# Patient Record
Sex: Female | Born: 1955 | Race: White | Hispanic: No | Marital: Married | Smoking: Never smoker
Health system: Southern US, Community
[De-identification: ages and names within clinical notes are randomized; demographics above are authoritative.]

## PROBLEM LIST (undated history)

## (undated) DIAGNOSIS — H269 Unspecified cataract: Secondary | ICD-10-CM

## (undated) DIAGNOSIS — G709 Myoneural disorder, unspecified: Secondary | ICD-10-CM

## (undated) DIAGNOSIS — F32A Depression, unspecified: Secondary | ICD-10-CM

## (undated) DIAGNOSIS — K219 Gastro-esophageal reflux disease without esophagitis: Secondary | ICD-10-CM

## (undated) DIAGNOSIS — IMO0002 Reserved for concepts with insufficient information to code with codable children: Secondary | ICD-10-CM

## (undated) DIAGNOSIS — E785 Hyperlipidemia, unspecified: Secondary | ICD-10-CM

## (undated) DIAGNOSIS — J45909 Unspecified asthma, uncomplicated: Secondary | ICD-10-CM

## (undated) DIAGNOSIS — T7840XA Allergy, unspecified, initial encounter: Secondary | ICD-10-CM

## (undated) DIAGNOSIS — K589 Irritable bowel syndrome without diarrhea: Secondary | ICD-10-CM

## (undated) DIAGNOSIS — F329 Major depressive disorder, single episode, unspecified: Secondary | ICD-10-CM

## (undated) DIAGNOSIS — F419 Anxiety disorder, unspecified: Secondary | ICD-10-CM

## (undated) DIAGNOSIS — Z5189 Encounter for other specified aftercare: Secondary | ICD-10-CM

## (undated) DIAGNOSIS — K259 Gastric ulcer, unspecified as acute or chronic, without hemorrhage or perforation: Secondary | ICD-10-CM

## (undated) DIAGNOSIS — I10 Essential (primary) hypertension: Secondary | ICD-10-CM

## (undated) DIAGNOSIS — D649 Anemia, unspecified: Secondary | ICD-10-CM

## (undated) HISTORY — PX: DIAGNOSTIC LAPAROSCOPY: SUR761

## (undated) HISTORY — PX: OOPHORECTOMY: SHX86

## (undated) HISTORY — PX: EYE SURGERY: SHX253

## (undated) HISTORY — DX: Allergy, unspecified, initial encounter: T78.40XA

## (undated) HISTORY — PX: EXPLORATORY LAPAROTOMY: SUR591

## (undated) HISTORY — DX: Reserved for concepts with insufficient information to code with codable children: IMO0002

## (undated) HISTORY — DX: Myoneural disorder, unspecified: G70.9

## (undated) HISTORY — DX: Anemia, unspecified: D64.9

## (undated) HISTORY — PX: COLONOSCOPY: SHX174

## (undated) HISTORY — PX: FOOT SURGERY: SHX648

## (undated) HISTORY — PX: ABDOMINAL HYSTERECTOMY: SHX81

## (undated) HISTORY — PX: TONSILLECTOMY: SUR1361

## (undated) HISTORY — DX: Unspecified cataract: H26.9

## (undated) HISTORY — DX: Encounter for other specified aftercare: Z51.89

---

## 1997-04-01 ENCOUNTER — Ambulatory Visit (HOSPITAL_COMMUNITY): Admission: RE | Admit: 1997-04-01 | Discharge: 1997-04-01 | Payer: Self-pay | Admitting: Obstetrics and Gynecology

## 1998-04-26 ENCOUNTER — Other Ambulatory Visit: Admission: RE | Admit: 1998-04-26 | Discharge: 1998-04-26 | Payer: Self-pay | Admitting: Obstetrics and Gynecology

## 1999-03-07 ENCOUNTER — Other Ambulatory Visit: Admission: RE | Admit: 1999-03-07 | Discharge: 1999-03-07 | Payer: Self-pay | Admitting: Obstetrics and Gynecology

## 2000-03-05 ENCOUNTER — Other Ambulatory Visit: Admission: RE | Admit: 2000-03-05 | Discharge: 2000-03-05 | Payer: Self-pay | Admitting: Obstetrics and Gynecology

## 2000-06-10 ENCOUNTER — Encounter: Payer: Self-pay | Admitting: Obstetrics and Gynecology

## 2000-06-10 ENCOUNTER — Ambulatory Visit (HOSPITAL_COMMUNITY): Admission: RE | Admit: 2000-06-10 | Discharge: 2000-06-10 | Payer: Self-pay | Admitting: Obstetrics and Gynecology

## 2000-06-17 ENCOUNTER — Encounter: Payer: Self-pay | Admitting: Obstetrics and Gynecology

## 2000-06-17 ENCOUNTER — Encounter: Admission: RE | Admit: 2000-06-17 | Discharge: 2000-06-17 | Payer: Self-pay | Admitting: Obstetrics and Gynecology

## 2001-03-05 ENCOUNTER — Other Ambulatory Visit: Admission: RE | Admit: 2001-03-05 | Discharge: 2001-03-05 | Payer: Self-pay | Admitting: Obstetrics and Gynecology

## 2002-03-10 ENCOUNTER — Other Ambulatory Visit: Admission: RE | Admit: 2002-03-10 | Discharge: 2002-03-10 | Payer: Self-pay | Admitting: Obstetrics and Gynecology

## 2003-05-11 ENCOUNTER — Other Ambulatory Visit: Admission: RE | Admit: 2003-05-11 | Discharge: 2003-05-11 | Payer: Self-pay | Admitting: Obstetrics and Gynecology

## 2004-05-17 ENCOUNTER — Other Ambulatory Visit: Admission: RE | Admit: 2004-05-17 | Discharge: 2004-05-17 | Payer: Self-pay | Admitting: Obstetrics and Gynecology

## 2004-06-01 ENCOUNTER — Ambulatory Visit: Payer: Self-pay | Admitting: Family Medicine

## 2004-11-08 ENCOUNTER — Ambulatory Visit: Payer: Self-pay | Admitting: Family Medicine

## 2004-11-16 ENCOUNTER — Ambulatory Visit: Payer: Self-pay | Admitting: Family Medicine

## 2005-02-22 ENCOUNTER — Ambulatory Visit: Payer: Self-pay | Admitting: Family Medicine

## 2005-03-23 ENCOUNTER — Ambulatory Visit: Payer: Self-pay | Admitting: Family Medicine

## 2005-05-17 ENCOUNTER — Ambulatory Visit: Payer: Self-pay | Admitting: Family Medicine

## 2006-05-09 ENCOUNTER — Ambulatory Visit: Payer: Self-pay | Admitting: Obstetrics and Gynecology

## 2007-05-13 ENCOUNTER — Ambulatory Visit: Payer: Self-pay | Admitting: Internal Medicine

## 2008-05-27 ENCOUNTER — Ambulatory Visit: Payer: Self-pay | Admitting: Obstetrics and Gynecology

## 2009-06-07 ENCOUNTER — Ambulatory Visit: Payer: Self-pay | Admitting: Obstetrics and Gynecology

## 2010-07-04 ENCOUNTER — Ambulatory Visit: Payer: Self-pay

## 2011-07-05 ENCOUNTER — Ambulatory Visit: Payer: Self-pay

## 2011-08-10 ENCOUNTER — Inpatient Hospital Stay: Payer: Self-pay | Admitting: Internal Medicine

## 2011-08-10 LAB — COMPREHENSIVE METABOLIC PANEL
Albumin: 4 g/dL (ref 3.4–5.0)
Albumin: 4.2 g/dL (ref 3.4–5.0)
Alkaline Phosphatase: 92 U/L (ref 50–136)
Alkaline Phosphatase: 88 U/L (ref 50–136)
Anion Gap: 11 (ref 7–16)
Anion Gap: 10 (ref 7–16)
BUN: 9 mg/dL (ref 7–18)
BUN: 8 mg/dL (ref 7–18)
Bilirubin,Total: 0.5 mg/dL (ref 0.2–1.0)
Bilirubin,Total: 0.6 mg/dL (ref 0.2–1.0)
Calcium, Total: 9.1 mg/dL (ref 8.5–10.1)
Calcium, Total: 9.3 mg/dL (ref 8.5–10.1)
Chloride: 73 mmol/L — ABNORMAL LOW (ref 98–107)
Chloride: 72 mmol/L — ABNORMAL LOW (ref 98–107)
Co2: 32 mmol/L (ref 21–32)
Co2: 33 mmol/L — ABNORMAL HIGH (ref 21–32)
Creatinine: 0.67 mg/dL (ref 0.60–1.30)
Creatinine: 0.6 mg/dL (ref 0.60–1.30)
EGFR (African American): >60
EGFR (African American): >60
EGFR (Non-African Amer.): >60
EGFR (Non-African Amer.): >60
Glucose: 130 mg/dL — ABNORMAL HIGH (ref 65–99)
Glucose: 128 mg/dL — ABNORMAL HIGH (ref 65–99)
Osmolality: 235 (ref 275–301)
Osmolality: 233 (ref 275–301)
Potassium: 1.9 mmol/L — CL (ref 3.5–5.1)
Potassium: 1.8 mmol/L — CL (ref 3.5–5.1)
SGOT(AST): 44 U/L — ABNORMAL HIGH (ref 15–37)
SGOT(AST): 51 U/L — ABNORMAL HIGH (ref 15–37)
SGPT (ALT): 40 U/L
SGPT (ALT): 42 U/L
Sodium: 116 mmol/L — CL (ref 136–145)
Sodium: 115 mmol/L — CL (ref 136–145)
Total Protein: 7.3 g/dL (ref 6.4–8.2)
Total Protein: 7.4 g/dL (ref 6.4–8.2)

## 2011-08-10 LAB — CBC
HCT: 36.8 % (ref 35.0–47.0)
HGB: 13.4 g/dL (ref 12.0–16.0)
MCH: 31.5 pg (ref 26.0–34.0)
MCHC: 36.5 g/dL — ABNORMAL HIGH (ref 32.0–36.0)
MCV: 86 fL (ref 80–100)
Platelet: 303 10*3/uL (ref 150–440)
RBC: 4.25 10*6/uL (ref 3.80–5.20)
RDW: 12 % (ref 11.5–14.5)
WBC: 5.7 10*3/uL (ref 3.6–11.0)

## 2011-08-10 LAB — URINALYSIS, COMPLETE
Bacteria: NONE SEEN
Bilirubin,UR: NEGATIVE
Blood: NEGATIVE
Glucose,UR: NEGATIVE mg/dL (ref 0–75)
Ketone: NEGATIVE
Leukocyte Esterase: NEGATIVE
Nitrite: NEGATIVE
Ph: 9 (ref 4.5–8.0)
Protein: NEGATIVE
RBC,UR: <1 /HPF (ref 0–5)
Specific Gravity: 1.002 (ref 1.003–1.030)
Squamous Epithelial: NONE SEEN
WBC UR: NONE SEEN /HPF (ref 0–5)

## 2011-08-10 LAB — PHOSPHORUS: Phosphorus: 1.6 mg/dL — ABNORMAL LOW (ref 2.5–4.9)

## 2011-08-10 LAB — MAGNESIUM: Magnesium: 1.8 mg/dL

## 2011-08-10 LAB — SODIUM, URINE, RANDOM: Sodium, Urine Random: 47 mmol/L (ref 20–110)

## 2011-08-10 LAB — TSH: Thyroid Stimulating Horm: 3.9 u[IU]/mL

## 2011-08-11 LAB — CBC WITH DIFFERENTIAL/PLATELET
Basophil #: 0 10*3/uL (ref 0.0–0.1)
Basophil %: 0.9 %
HCT: 37.2 % (ref 35.0–47.0)
HGB: 13.6 g/dL (ref 12.0–16.0)
Lymphocyte %: 20.7 %
MCH: 32.1 pg (ref 26.0–34.0)
Monocyte #: 0.5 x10 3/mm (ref 0.2–0.9)
Monocyte %: 10.1 %
Neutrophil #: 3.3 10*3/uL (ref 1.4–6.5)
Neutrophil %: 66.9 %
Platelet: 302 10*3/uL (ref 150–440)
RBC: 4.23 10*6/uL (ref 3.80–5.20)
RDW: 12.2 % (ref 11.5–14.5)
WBC: 4.9 10*3/uL (ref 3.6–11.0)

## 2011-08-11 LAB — BASIC METABOLIC PANEL
Anion Gap: 6 — ABNORMAL LOW (ref 7–16)
Anion Gap: 7 (ref 7–16)
BUN: 6 mg/dL — ABNORMAL LOW (ref 7–18)
BUN: 5 mg/dL — ABNORMAL LOW (ref 7–18)
Calcium, Total: 8.8 mg/dL (ref 8.5–10.1)
Calcium, Total: 9 mg/dL (ref 8.5–10.1)
Co2: 31 mmol/L (ref 21–32)
Creatinine: 0.69 mg/dL (ref 0.60–1.30)
Creatinine: 0.66 mg/dL (ref 0.60–1.30)
EGFR (African American): >60
EGFR (African American): >60
EGFR (Non-African Amer.): >60
EGFR (Non-African Amer.): >60
Glucose: 107 mg/dL — ABNORMAL HIGH (ref 65–99)
Glucose: 94 mg/dL (ref 65–99)
Osmolality: 255 (ref 275–301)
Osmolality: 262 (ref 275–301)
Potassium: 2.8 mmol/L — ABNORMAL LOW (ref 3.5–5.1)
Potassium: 3.2 mmol/L — ABNORMAL LOW (ref 3.5–5.1)
Sodium: 132 mmol/L — ABNORMAL LOW (ref 136–145)

## 2011-08-11 LAB — MAGNESIUM: Magnesium: 2.2 mg/dL

## 2011-08-12 LAB — CBC WITH DIFFERENTIAL/PLATELET
Basophil %: 1.4 %
Eosinophil #: 0.2 10*3/uL (ref 0.0–0.7)
Eosinophil %: 5.4 %
HCT: 35 % (ref 35.0–47.0)
HGB: 12.4 g/dL (ref 12.0–16.0)
Lymphocyte #: 1.2 10*3/uL (ref 1.0–3.6)
MCH: 32.1 pg (ref 26.0–34.0)
MCHC: 35.3 g/dL (ref 32.0–36.0)
MCV: 91 fL (ref 80–100)
Monocyte #: 0.4 x10 3/mm (ref 0.2–0.9)
Monocyte %: 8.2 %
Neutrophil #: 2.6 10*3/uL (ref 1.4–6.5)
RBC: 3.85 10*6/uL (ref 3.80–5.20)

## 2011-08-12 LAB — BASIC METABOLIC PANEL
Calcium, Total: 8.5 mg/dL (ref 8.5–10.1)
Chloride: 104 mmol/L (ref 98–107)
EGFR (African American): >60
EGFR (Non-African Amer.): >60
Glucose: 88 mg/dL (ref 65–99)
Osmolality: 272 (ref 275–301)
Potassium: 4.4 mmol/L (ref 3.5–5.1)

## 2012-04-18 ENCOUNTER — Ambulatory Visit: Payer: Self-pay

## 2012-07-15 ENCOUNTER — Ambulatory Visit: Payer: Self-pay

## 2013-07-16 ENCOUNTER — Ambulatory Visit: Payer: Self-pay

## 2014-01-04 ENCOUNTER — Ambulatory Visit: Payer: Self-pay

## 2014-05-23 NOTE — Discharge Summary (Signed)
PATIENT NAME:  Angela Espinoza, Angela Espinoza MR#:  [id number] DATE OF BIRTH:  06-07-1955  DATE OF ADMISSION:  08/10/2011 DATE OF DISCHARGE:  08/12/2011   REASON FOR ADMISSION: Acute hyponatremia with symptoms.   HISTORY OF PRESENT ILLNESS: Please see the dictated history of present illness done by Dr. Nemiah CommanderKalisetti on 08/10/2011.   PAST MEDICAL HISTORY:  1. Hyperlipidemia.  2. IBS. 3. Anxiety/depression.  4. Benign hypertension.  5. Status post hysterectomy.   MEDICATIONS ON ADMISSION: Please see admission note.   ALLERGIES: No known drug allergies.   SOCIAL HISTORY, FAMILY HISTORY, AND REVIEW OF SYSTEMS: As per admission note.   PHYSICAL EXAMINATION: The patient was in no acute distress. Vital signs were stable and she was afebrile. HEENT exam was unremarkable. NECK was supple without JVD. LUNGS were clear. CARDIAC exam revealed a regular rate and rhythm with normal S1 and S2. ABDOMEN soft and nontender. EXTREMITIES without edema. NEUROLOGIC exam was grossly nonfocal.   LABORATORY DATA: Laboratory data revealed a sodium of 115 with a potassium of 1.8.   HOSPITAL COURSE: The patient was admitted with acute hyponatremia and hypokalemia presumably due to indapamide. She was taken off indapamide and started on IV normal saline with potassium supplementation. She was followed on telemetry and remained in normal sinus rhythm. Her electrolytes improved with IV fluids and p.o. potassium supplementation. The patient remained stable without hypertension in the hospital. Her electrolytes normalized by 08/12/2011. She was ambulating well without symptoms. She was requesting discharge. She is now discharged home in good condition for further outpatient follow-up.   DISCHARGE DIAGNOSES:  1. Hyponatremia, resolved.  2. Hypokalemia, resolved.  3. Benign hypertension, stable off medication. 4. Anxiety/depression.   DISCHARGE MEDICATIONS:  1. Protonix 40 mg p.o. daily.  2. Xanax 0.25 mg p.o. q.8 hours p.r.n.  anxiety.  3. Cymbalta 60 mg p.o. daily.  4. [name] 20 mEq p.o. daily.   FOLLOW-UP PLANS AND APPOINTMENTS:  1. The patient was discharged on a low sodium diet.  2. She was told not to take indapamide.  3. She will follow-up with Dr. Marcello FennelHande in 2 to 3 days, sooner if needed.   ____________________________ Duane LopeJeffrey D. Judithann SheenSparks, MD jds:drc D: 08/12/2011 07:48:27 ET T: 08/12/2011 08:16:30 ET JOB#: [id number]  cc: Duane LopeJeffrey D. Judithann SheenSparks, MD, <Dictator> JEFFREY Rodena Medin SPARKS MD ELECTRONICALLY SIGNED 08/12/2011 10:35

## 2014-05-23 NOTE — H&P (Signed)
PATIENT NAME:  Angela Espinoza, Angela Espinoza MR#:  [id number] DATE OF BIRTH:  Jun 21, 1955  DATE OF ADMISSION:  08/10/2011  ADMITTING PHYSICIAN: Enid Baas, MD PRIMARY MD:  Dr Marcello Fennel  CHIEF COMPLAINT: Weakness and almost passed out.   HISTORY OF PRESENT ILLNESS: Ms. 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 Went to work today and was brought in with the above-mentioned complaints. The patient has been under a lot of stress at work and also at home, and her blood pressure was persistently elevated and so her PCP started her on indapamide 2 weeks ago. Since she took the medication she started to have some nausea, vomiting about a week ago which she felt was secondary to viral illness. She was feeling fine otherwise except today when she woke up she felt extremely tired. She went to work and started having tingling, numbness all over her hands, feet, and lightheaded, extremely weak and felt as if she was going to pass out and so was brought to the ED. She has been urinating a lot since she was started on that medication. She was found to have a sodium of 115 and potassium of 1.8 and is being admitted for her electrolyte abnormalities. Her last electrolyte panel done from PCP's office was from December 2012 which shows a sodium of 140 and potassium of 4.1 and chloride of 105.   PAST MEDICAL HISTORY:  Hyperlipemia.  Possible irritable bowel syndrome.  Depression and anxiety.  Persistently elevated blood pressure likely due to stress and recently started on medication.   PAST SURGICAL HISTORY:  1. Exploratory laparotomy.  2. Left ovarian cystectomy for dermoid.  3. Total abdominal hysterectomy.  4. Foot surgery.   ALLERGIES TO MEDICATIONS: No known drug allergies.   CURRENT MEDICATIONS AT HOME:  1. Allegra 180 mg p.o. daily.  2. Cymbalta 60  mg p.o. daily.  3. Biotin 100 mcg 2 tablets p.o. b.i.d.  4. Zinc tablet 50 mg p.o. daily.  5. Phenergan 25 mg p.r.n. for nausea.  6. Omega-3 fatty acid 1200 mg 2 capsules by mouth daily.  7. Vitamin C 500 mg p.o. daily.  8. Garlic capsule 0454 mg p.o. daily.   9. Multivitamin 1 tablet p.o. daily.  10. Crestor 5 mg p.o. daily.  11. Xanax 0.25 mg p.o. p.r.n. for anxiety.   SOCIAL HISTORY: Lives at home with husband. No smoking or alcohol use. 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   FAMILY HISTORY: Mother died in 70s, probably with heart disease, and father with lung carcinoma.    REVIEW OF SYSTEMS: CONSTITUTIONAL: No fever. Positive for fatigue and generalized weakness. EYES: No blurred vision, double vision, pain, inflammation, or glaucoma. Uses reading glasses. ENT: No tinnitus, ear pain, hearing loss, epistaxis, or discharge. RESPIRATORY: No cough, wheeze, hemoptysis, or chronic obstructive pulmonary disease.  CARDIOVASCULAR: No chest pain, orthopnea, edema, arrhythmia, palpitations, or syncope.  GI: No nausea, vomiting, diarrhea, abdominal pain, hematemesis, or melena. GU: Increased frequency of urination. No dysuria. ENDOCRINE: No polyuria, nocturia, thyroid problems, heat or cold intolerance. HEMATOLOGY: No anemia, easy bruising or bleeding. SKIN: No acne, rash, or lesions. MUSCULOSKELETAL: No neck, back, shoulder pain, arthritis, or gout. Generalized achy sensation. NEUROLOGIC: Tingling, numbness all over the body today, none currently. No previous history of cerebrovascular accident, transient ischemic attack, or seizures.  PSYCHOLOGIC: Positive for depression and anxiety. Currently not depressed. No  insomnia.   PHYSICAL EXAMINATION:  VITAL SIGNS: Temperature 96 degrees Fahrenheit, pulse 91, respirations 20, blood pressure 157/75, pulse oximetry 100% on room air.   GENERAL: Well-built, well-nourished female lying in bed, not in any acute distress.    HEENT: Normocephalic, atraumatic. Pupils equal, round, reacting to light. Anicteric sclerae. Extraocular movements intact. Oropharynx clear without erythema, mass, or exudates.   NECK: Supple. No thyromegaly, JVD, or carotid bruits. No lymphadenopathy.   LUNGS: Clear to auscultation bilaterally. No wheeze or crackles. No use of accessory muscles for breathing.    CARDIOVASCULAR: S1, S2, regular rate and rhythm. No murmurs, rubs, or gallops.   ABDOMEN: Soft, nontender, nondistended. No hepatosplenomegaly. Normal bowel sounds.   EXTREMITIES: No pedal edema. No clubbing or cyanosis; 2+ dorsalis pedis pulses palpable bilaterally.   SKIN: No acne, rash, or lesions.   LYMPHATICS: No cervical lymphadenopathy.   NEUROLOGIC: Cranial nerves intact.  No focal motor or sensory deficits.   PSYCHOLOGICAL: The patient is awake, alert, oriented x3.   LABORATORY DATA:  1. WBC 5.7, hemoglobin 13.4, hematocrit 36.8, platelet count 303,000. 2. Sodium 115, potassium 1.8, chloride 72, bicarbonate 33, BUN 8, creatinine 0.6, glucose of 128, and calcium 9.3. 3. ALT is 42, AST 51, alkaline phosphatase is 88, total bilirubin 0.6, albumin of 4.2.  4. Urinalysis is pending at this time.  5. EKG showing normal sinus rhythm, heart rate of 88. No ST-T wave abnormalities were seen.   ASSESSMENT AND PLAN: A 59 year old female with significant history of depression, anxiety, and hypertension, was recently started on indapamide for her blood pressure, comes in with hyponatremia and hypokalemia.  1. Severe hyponatremia and hypochloremia likely from indapamide which is a thiazide-like diuretic. We will discontinue that medication, start on IV normal saline with potassium supplements and recheck labs in another 6 to 8 hours. Continue to monitor. If no improvement might need Nephrology consult. Since she is not that symptomatic and stable will not start on any hypertonic saline.   2. Hypokalemia, again likely from  diuretic. Continue oral and IV supplements and recheck. Urine electrolytes have been ordered.   3. Depression and anxiety. Continue Cymbalta and Xanax p.r.n.  4. Gastrointestinal and deep vein thrombosis prophylaxis. On Protonix and also TED stockings. The patient is ambulatory.  CODE STATUS: FULL CODE.   TIME SPENT ON ADMISSION: 50 minutes.    ____________________________ Enid Baasadhika Erric Machnik, MD rk:vtd D: 08/10/2011 19:58:36 ET T: 08/11/2011 07:16:31 ET JOB#: [id number]  cc: Enid Baasadhika Karys Meckley, MD, <Dictator> Barbette ReichmannVishwanath Hande, MD Enid BaasADHIKA Christiano Blandon MD ELECTRONICALLY SIGNED 08/27/2011 15:11

## 2014-06-02 DIAGNOSIS — Z780 Asymptomatic menopausal state: Secondary | ICD-10-CM | POA: Insufficient documentation

## 2014-06-03 ENCOUNTER — Other Ambulatory Visit: Payer: Self-pay | Admitting: Obstetrics and Gynecology

## 2014-06-03 ENCOUNTER — Other Ambulatory Visit: Payer: Self-pay

## 2014-06-03 DIAGNOSIS — Z1231 Encounter for screening mammogram for malignant neoplasm of breast: Secondary | ICD-10-CM

## 2014-07-07 ENCOUNTER — Encounter
Admission: RE | Admit: 2014-07-07 | Discharge: 2014-07-07 | Disposition: A | Discharge status: Home / Self Care | Payer: BLUE CROSS/BLUE SHIELD | Source: Ambulatory Visit | Attending: Anesthesiology | Admitting: Anesthesiology

## 2014-07-07 ENCOUNTER — Encounter: Payer: Self-pay | Admitting: *Deleted

## 2014-07-07 DIAGNOSIS — Z79899 Other long term (current) drug therapy: Secondary | ICD-10-CM | POA: Diagnosis not present

## 2014-07-07 DIAGNOSIS — E785 Hyperlipidemia, unspecified: Secondary | ICD-10-CM | POA: Diagnosis not present

## 2014-07-07 DIAGNOSIS — K589 Irritable bowel syndrome without diarrhea: Secondary | ICD-10-CM | POA: Diagnosis not present

## 2014-07-07 DIAGNOSIS — I10 Essential (primary) hypertension: Secondary | ICD-10-CM | POA: Diagnosis not present

## 2014-07-07 DIAGNOSIS — Z01810 Encounter for preprocedural cardiovascular examination: Secondary | ICD-10-CM | POA: Insufficient documentation

## 2014-07-07 DIAGNOSIS — Z888 Allergy status to other drugs, medicaments and biological substances status: Secondary | ICD-10-CM | POA: Diagnosis not present

## 2014-07-07 DIAGNOSIS — G4730 Sleep apnea, unspecified: Secondary | ICD-10-CM | POA: Diagnosis not present

## 2014-07-07 DIAGNOSIS — F329 Major depressive disorder, single episode, unspecified: Secondary | ICD-10-CM | POA: Diagnosis not present

## 2014-07-07 DIAGNOSIS — F419 Anxiety disorder, unspecified: Secondary | ICD-10-CM | POA: Diagnosis not present

## 2014-07-07 DIAGNOSIS — E882 Lipomatosis, not elsewhere classified: Secondary | ICD-10-CM | POA: Diagnosis not present

## 2014-07-07 DIAGNOSIS — Z9889 Other specified postprocedural states: Secondary | ICD-10-CM | POA: Diagnosis not present

## 2014-07-07 DIAGNOSIS — Z90710 Acquired absence of both cervix and uterus: Secondary | ICD-10-CM | POA: Diagnosis not present

## 2014-07-07 DIAGNOSIS — J45909 Unspecified asthma, uncomplicated: Secondary | ICD-10-CM | POA: Diagnosis not present

## 2014-07-07 NOTE — Patient Instructions (Addendum)
  Your procedure is scheduled on: 07-16-14 Report to [facility]GERY 2ND FLOOR To find out your arrival time please call [phone] between 1PM - 3PM on 07-15-14 [facility](THURSDAY)  Remember: Instructions that are not followed completely may result in serious medical risk, up to and including death, or upon the discretion of your surgeon and anesthesiologist your surgery may need to be rescheduled.    _X___ 1. Do not eat food or drink liquids after midnight. No gum chewing or hard candies.     _X___ 2. No Alcohol for 24 hours before or after surgery.   ____ 3. Bring all medications with you on the day of surgery if instructed.    _X___ 4. Notify your doctor if there is any change in your medical condition     (cold, fever, infections).     Do not wear jewelry, make-up, hairpins, clips or nail polish.  Do not wear lotions, powders, or perfumes. You may wear deodorant.  Do not shave 48 hours prior to surgery. Men may shave face and neck.  Do not bring valuables to the hospital.    [facility] is not responsible for any belongings or valuables.               Contacts, dentures or bridgework may not be worn into surgery.  Leave your suitcase in the car. After surgery it may be brought to your room.  For patients admitted to the hospital, discharge time is determined by your  treatment team.   Patients discharged the day of surgery will not be allowed to drive home.   Please read over the following fact sheets that you were given:      __X__ Take these medicines the morning of surgery with A SIP OF WATER:    1. CYMBALTA  2. LOSARTAN  3. METOPROLOL  4.  5.  6.  ____ Fleet Enema (as directed)   _X___ Use CHG Soap as directed  ____ Use inhalers on the day of surgery  ____ Stop metformin 2 days prior to surgery    ____ Take 1/2 of usual insulin dose the night before surgery and none on the morning of surgery.   ____ Stop Coumadin/Plavix/aspirin-N/A  ____ Stop  Anti-inflammatories-NO NSAIDS OR ASPIRIN PRODUCTS-TYLENOL OK TO TAKE   _X___ Stop supplements until after surgery-STOP VITAMIN C,BIOTIN,FISH OIL NOW   ____ Bring C-Pap to the hospital.

## 2014-07-19 ENCOUNTER — Ambulatory Visit: Payer: Self-pay

## 2014-08-03 ENCOUNTER — Ambulatory Visit: Payer: BLUE CROSS/BLUE SHIELD | Admitting: Anesthesiology

## 2014-08-03 ENCOUNTER — Encounter
Admission: RE | Disposition: A | Discharge status: Home / Self Care | Payer: Self-pay | Source: Ambulatory Visit | Attending: Surgery

## 2014-08-03 ENCOUNTER — Encounter: Payer: Self-pay | Admitting: *Deleted

## 2014-08-03 ENCOUNTER — Ambulatory Visit
Admission: RE | Admit: 2014-08-03 | Discharge: 2014-08-03 | Disposition: A | Discharge status: Home / Self Care | Payer: BLUE CROSS/BLUE SHIELD | Source: Ambulatory Visit | Attending: Surgery | Admitting: Surgery

## 2014-08-03 DIAGNOSIS — Z9889 Other specified postprocedural states: Secondary | ICD-10-CM | POA: Insufficient documentation

## 2014-08-03 DIAGNOSIS — F419 Anxiety disorder, unspecified: Secondary | ICD-10-CM | POA: Insufficient documentation

## 2014-08-03 DIAGNOSIS — J45909 Unspecified asthma, uncomplicated: Secondary | ICD-10-CM | POA: Insufficient documentation

## 2014-08-03 DIAGNOSIS — K589 Irritable bowel syndrome without diarrhea: Secondary | ICD-10-CM | POA: Insufficient documentation

## 2014-08-03 DIAGNOSIS — Z888 Allergy status to other drugs, medicaments and biological substances status: Secondary | ICD-10-CM | POA: Insufficient documentation

## 2014-08-03 DIAGNOSIS — F329 Major depressive disorder, single episode, unspecified: Secondary | ICD-10-CM | POA: Insufficient documentation

## 2014-08-03 DIAGNOSIS — I10 Essential (primary) hypertension: Secondary | ICD-10-CM | POA: Insufficient documentation

## 2014-08-03 DIAGNOSIS — E785 Hyperlipidemia, unspecified: Secondary | ICD-10-CM | POA: Insufficient documentation

## 2014-08-03 DIAGNOSIS — Z90710 Acquired absence of both cervix and uterus: Secondary | ICD-10-CM | POA: Insufficient documentation

## 2014-08-03 DIAGNOSIS — Z79899 Other long term (current) drug therapy: Secondary | ICD-10-CM | POA: Insufficient documentation

## 2014-08-03 DIAGNOSIS — E882 Lipomatosis, not elsewhere classified: Secondary | ICD-10-CM | POA: Diagnosis not present

## 2014-08-03 DIAGNOSIS — G4730 Sleep apnea, unspecified: Secondary | ICD-10-CM | POA: Insufficient documentation

## 2014-08-03 HISTORY — DX: Unspecified asthma, uncomplicated: J45.909

## 2014-08-03 HISTORY — DX: Essential (primary) hypertension: I10

## 2014-08-03 HISTORY — DX: Anxiety disorder, unspecified: F41.9

## 2014-08-03 HISTORY — PX: AXILLARY LYMPH NODE BIOPSY: SHX5737

## 2014-08-03 HISTORY — DX: Irritable bowel syndrome, unspecified: K58.9

## 2014-08-03 HISTORY — DX: Hyperlipidemia, unspecified: E78.5

## 2014-08-03 HISTORY — DX: Depression, unspecified: F32.A

## 2014-08-03 HISTORY — DX: Major depressive disorder, single episode, unspecified: F32.9

## 2014-08-03 SURGERY — AXILLARY LYMPH NODE BIOPSY
Anesthesia: General | Laterality: Right | Wound class: Clean

## 2014-08-03 MED ORDER — OXYCODONE HCL 5 MG/5ML PO SOLN: 5.0000 mg | Freq: Once | ORAL | Status: DC | PRN

## 2014-08-03 MED ORDER — BUPIVACAINE-EPINEPHRINE (PF) 0.5% -1:200000 IJ SOLN
INTRAMUSCULAR | Status: DC | PRN
  Administered 2014-08-03: 7 mL via PERINEURAL

## 2014-08-03 MED ORDER — MIDAZOLAM HCL 2 MG/2ML IJ SOLN
INTRAMUSCULAR | Status: DC | PRN
  Administered 2014-08-03: 2 mg via INTRAVENOUS

## 2014-08-03 MED ORDER — HYDROCODONE-ACETAMINOPHEN 5-325 MG PO TABS: 1.0000 | ORAL_TABLET | ORAL | Status: DC | PRN

## 2014-08-03 MED ORDER — LACTATED RINGERS IV SOLN
INTRAVENOUS | Status: DC
  Administered 2014-08-03: 08:00:00 via INTRAVENOUS
  Administered 2014-08-03: 50 mL/h via INTRAVENOUS

## 2014-08-03 MED ORDER — BUPIVACAINE-EPINEPHRINE (PF) 0.5% -1:200000 IJ SOLN
INTRAMUSCULAR | Status: AC
  Filled 2014-08-03: qty 30

## 2014-08-03 MED ORDER — LIDOCAINE HCL (CARDIAC) 20 MG/ML IV SOLN
INTRAVENOUS | Status: DC | PRN
  Administered 2014-08-03: 70 mg via INTRAVENOUS

## 2014-08-03 MED ORDER — PROPOFOL 10 MG/ML IV BOLUS
INTRAVENOUS | Status: DC | PRN
  Administered 2014-08-03: 150 mg via INTRAVENOUS

## 2014-08-03 MED ORDER — FENTANYL CITRATE (PF) 100 MCG/2ML IJ SOLN
25.0000 ug | INTRAMUSCULAR | Status: DC | PRN
  Administered 2014-08-03 (×3): 50 ug via INTRAVENOUS

## 2014-08-03 MED ORDER — FAMOTIDINE 20 MG PO TABS
ORAL_TABLET | ORAL | Status: AC
  Administered 2014-08-03: 20 mg via ORAL
  Filled 2014-08-03: qty 1

## 2014-08-03 MED ORDER — FAMOTIDINE 20 MG PO TABS
ORAL_TABLET | ORAL | Status: AC
  Filled 2014-08-03: qty 1

## 2014-08-03 MED ORDER — FENTANYL CITRATE (PF) 100 MCG/2ML IJ SOLN
INTRAMUSCULAR | Status: AC
  Filled 2014-08-03: qty 2

## 2014-08-03 MED ORDER — DEXAMETHASONE SODIUM PHOSPHATE 4 MG/ML IJ SOLN
INTRAMUSCULAR | Status: DC | PRN
  Administered 2014-08-03: 4 mg via INTRAVENOUS

## 2014-08-03 MED ORDER — OXYCODONE HCL 5 MG PO TABS: 5.0000 mg | ORAL_TABLET | Freq: Once | ORAL | Status: DC | PRN

## 2014-08-03 MED ORDER — FAMOTIDINE 20 MG PO TABS
20.0000 mg | ORAL_TABLET | Freq: Once | ORAL | Status: AC
  Administered 2014-08-03: 20 mg via ORAL

## 2014-08-03 MED ORDER — FENTANYL CITRATE (PF) 100 MCG/2ML IJ SOLN
INTRAMUSCULAR | Status: DC | PRN
  Administered 2014-08-03 (×2): 50 ug via INTRAVENOUS

## 2014-08-03 MED ORDER — ONDANSETRON HCL 4 MG/2ML IJ SOLN
INTRAMUSCULAR | Status: DC | PRN
  Administered 2014-08-03: 4 mg via INTRAVENOUS

## 2014-08-03 SURGICAL SUPPLY — 28 items
BLADE SURG 15 STRL LF DISP TIS (BLADE) ×1 IMPLANT
BLADE SURG 15 STRL SS (BLADE) ×2
BULB RESERV EVAC DRAIN JP 100C (MISCELLANEOUS) IMPLANT
CANISTER SUCT 1200ML W/VALVE (MISCELLANEOUS) ×3 IMPLANT
CHLORAPREP W/TINT 26ML (MISCELLANEOUS) ×3 IMPLANT
DRAIN CHANNEL JP 15F RND 16 (MISCELLANEOUS) IMPLANT
DRAPE LAPAROTOMY 100X77 ABD (DRAPES) ×3 IMPLANT
GAUZE SPONGE 4X4 12PLY STRL (GAUZE/BANDAGES/DRESSINGS) ×3 IMPLANT
GLOVE BIO SURGEON STRL SZ7.5 (GLOVE) ×15 IMPLANT
GOWN STRL REUS W/ TWL LRG LVL3 (GOWN DISPOSABLE) ×3 IMPLANT
GOWN STRL REUS W/TWL LRG LVL3 (GOWN DISPOSABLE) ×6
HARMONIC SCALPEL FOCUS (MISCELLANEOUS) IMPLANT
KIT RM TURNOVER STRD PROC AR (KITS) ×3 IMPLANT
LABEL OR SOLS (LABEL) ×3 IMPLANT
LIQUID BAND (GAUZE/BANDAGES/DRESSINGS) ×3 IMPLANT
NEEDLE HYPO 25X1 1.5 SAFETY (NEEDLE) ×3 IMPLANT
PACK BASIN MINOR ARMC (MISCELLANEOUS) ×3 IMPLANT
PAD GROUND ADULT SPLIT (MISCELLANEOUS) ×3 IMPLANT
SUT CHROMIC 4 0 RB 1X27 (SUTURE) ×3 IMPLANT
SUT ETH BLK MONO 3 0 FS 1 12/B (SUTURE) IMPLANT
SUT ETHILON 4-0 (SUTURE)
SUT ETHILON 4-0 FS2 18XMFL BLK (SUTURE)
SUT MNCRL 4-0 (SUTURE) ×2
SUT MNCRL 4-0 27XMFL (SUTURE) ×1
SUTURE ETHLN 4-0 FS2 18XMF BLK (SUTURE) IMPLANT
SUTURE MNCRL 4-0 27XMF (SUTURE) ×1 IMPLANT
SYRINGE 10CC LL (SYRINGE) ×3 IMPLANT
WATER STERILE IRR 1000ML POUR (IV SOLUTION) ×3 IMPLANT

## 2014-08-03 NOTE — Op Note (Signed)
OPERATIVE REPORT  PREOPERATIVE  DIAGNOSIS: . Symptomatic lipomatosis of right axilla  POSTOPERATIVE DIAGNOSIS: . Symptomatic lipomatosis right axilla  PROCEDURE: . Excision right axillary mass  ANESTHESIA:  General  SURGEON: Renda RollsWilton Almeter Westhoff  MD   INDICATIONS: . She has developed localized mass formation and discomfort in the right axilla. She requested excision.  The patient was placed on the operating table in the supine position under general anesthesia. The right arm was placed on the lateral arm support. The axilla was prepared with ChloraPrep and draped in a sterile manner.  An obliquely oriented elliptical excision was carried out in the mid aspect of the right axilla so that the incision was approximated 6 cm in length. Dissection was carried down through subcutaneous tissues and encountered a fatty mass of tissue which was excised using electrocautery for hemostasis. The excision was carried down as far as the latissimus dorsi muscle sterilely and anteriorly as deep as fascia. The excised portion of tissue was approximately 6 x 4.5 x 3 cm in dimension. This was submitted in formalin for routine pathology. Several small bleeding points were cauterized. One bleeding point was suture ligated with 4-0 chromic. The subcutaneous tissues were infiltrated with half percent Sensorcaine with epinephrine. The subcutaneous tissues were approximated with interrupted 4-0 chromic sutures. The skin was closed with running 4-0 Monocryl subcuticular suture and LiquiBand.  The patient tolerated the procedure satisfactorily and was then prepared for transfer to the recovery room  [facility] Kashus Karlen M.D.

## 2014-08-03 NOTE — H&P (Signed)
Angela Espinoza is an 59 y.o. female.   Chief Complaint: Uncomfortable mass of the right axilla HPI: This mass is slowly increased in size and call some moderate discomfort. She also feels a she can detect something under her arm when she put her arm by her side. She requested excision.  Past Medical History  Diagnosis Date  . Anxiety   . Depression   . IBS (irritable bowel syndrome)   . Hypertension   . Hyperlipidemia   . Asthma   . Sleep apnea     h/o    Past Surgical History  Procedure Laterality Date  . Oophorectomy    . Exploratory laparotomy    . Foot surgery    . Abdominal hysterectomy    . Colonoscopy    . Tonsillectomy      History reviewed. No pertinent family history. Social History:  reports that she has been passively smoking.  She does not have any smokeless tobacco history on file. She reports that she drinks alcohol. She reports that she does not use illicit drugs.  Allergies:  Allergies  Allergen Reactions  . Indapamide Other (See Comments)    tingling all over body    Medications Prior to Admission  Medication Sig Dispense Refill  . ALPRAZolam (XANAX) 0.25 MG tablet Take 1 tablet by mouth as needed.   3  . Biotin 2500 MCG CAPS Take 1 capsule by mouth.    . Cholecalciferol (VITAMIN D) 2000 UNITS tablet Take 2,000 Units by mouth daily.    Marland Kitchen. desloratadine (CLARINEX) 5 MG tablet Take 5 mg by mouth daily.    . DULoxetine (CYMBALTA) 60 MG capsule Take 60 mg by mouth every morning.    Marland Kitchen. estradiol (ESTRACE) 0.1 MG/GM vaginal cream Place 1 Applicatorful vaginally 2 (two) times a week.    . lidocaine (LIDODERM) 5 % Place 1 patch onto the skin daily. Remove & Discard patch within 12 hours or as directed by MD    . losartan (COZAAR) 25 MG tablet Take 25 mg by mouth every morning.    . metoprolol succinate (TOPROL-XL) 25 MG 24 hr tablet Take 25 mg by mouth every morning.    . Multiple Vitamin (MULTIVITAMIN) capsule Take 1 capsule by mouth daily.    . Omega-3  Fatty Acids (FISH OIL PO) Take 1 tablet by mouth daily.    . promethazine (PHENERGAN) 25 MG tablet Take 25 mg by mouth every 6 (six) hours as needed for nausea or vomiting.    . vitamin B-12 (CYANOCOBALAMIN) 500 MCG tablet Take 500 mcg by mouth daily.    . vitamin C (ASCORBIC ACID) 500 MG tablet Take 500 mg by mouth daily.      No results found for this or any previous visit (from the past 48 hour(s)). No results found.  ROS she did have a recent sinus infection and had to postpone her surgery. She now feels better. She is having no difficulties breathing. She reports no other recent acute illness.  Blood pressure 105/73, pulse 70, temperature 98.2 F (36.8 C), temperature source Oral, resp. rate 16, height 5\' 2"  (1.575 m), weight 70.308 kg (155 lb), SpO2 99 %.   Physical Exam she is awake alert and oriented. Pupils equal reactive to light. Sclera clear. Pharynx clear. Her uvula has been surgically removed. Lung sounds are clear. Heart regular rhythm S1 and S2. Right axilla with palpable fatty mass which is some 6 cm in dimension. This was marked. Neurologic, awake alert and oriented  moving all extremities.  Assessment/Plan Symptomatic lipomatosis of the right axilla  Plan is excision of mass of right axilla  Renda Rolls 08/03/2014, 7:27 AM

## 2014-08-03 NOTE — OR Nursing (Signed)
Dr Katrinka BlazingSmith visited post op

## 2014-08-03 NOTE — Anesthesia Postprocedure Evaluation (Signed)
  Anesthesia Post-op Note  Patient: Angela Espinoza  Procedure(s) Performed: Procedure(s): EXCISION RIGHT  AXILLARY LIPOMATOSIS (Right)  Anesthesia type:General LMA  Patient location: PACU  Post pain: Pain level controlled  Post assessment: Post-op Vital signs reviewed, Patient's Cardiovascular Status Stable, Respiratory Function Stable, Patent Airway and No signs of Nausea or vomiting  Post vital signs: Reviewed and stable  Last Vitals:  Filed Vitals:   08/03/14 0900  BP:   Pulse:   Temp: 36.4 C  Resp:     Level of consciousness: awake, alert  and patient cooperative  Complications: No apparent anesthesia complications

## 2014-08-03 NOTE — Discharge Instructions (Signed)
Take Tylenol or Norco if needed for pain.  Continue usual medicines.  May shower. Gradually increase activities.  Anticipate return to work in approximately 1-2 weeks.                                                      AMBULATORY SURGERY  DISCHARGE INSTRUCTIONS   1) The drugs that you were given will stay in your system until tomorrow so for the next 24 hours you should not:  A) Drive an automobile B) Make any legal decisions C) Drink any alcoholic beverage   2) You may resume regular meals tomorrow.  Today it is better to start with liquids and gradually work up to solid foods.  You may eat anything you prefer, but it is better to start with liquids, then soup and crackers, and gradually work up to solid foods.   3) Please notify your doctor immediately if you have any unusual bleeding, trouble breathing, redness and pain at the surgery site, drainage, fever, or pain not relieved by medication.    4) Additional Instructions:        Please contact your physician with any problems or Same Day Surgery at [phone], Monday through Friday 6 am to 4 pm, or [place] at [facility] Main number at [phone].

## 2014-08-03 NOTE — Transfer of Care (Signed)
Immediate Anesthesia Transfer of Care Note  Patient: Angela Espinoza  Procedure(s) Performed: Procedure(s): EXCISION RIGHT  AXILLARY LIPOMATOSIS (Right)  Patient Location: PACU  Anesthesia Type:General  Level of Consciousness: awake, alert  and oriented  Airway & Oxygen Therapy: Patient connected to face mask oxygen  Post-op Assessment: Report given to RN  Post vital signs: Reviewed and stable  Last Vitals:  Filed Vitals:   08/03/14 0626  BP: 105/73  Pulse: 70  Temp: 36.8 C  Resp: 16    Complications: No apparent anesthesia complications

## 2014-08-03 NOTE — Anesthesia Preprocedure Evaluation (Signed)
Anesthesia Evaluation  Patient identified by MRN, date of birth, ID band Patient awake    Reviewed: Allergy & Precautions, NPO status , Patient's Chart, lab work & pertinent test results  Airway Mallampati: II  TM Distance: >3 FB Neck ROM: full    Dental  (+) Implants   Pulmonary asthma , sleep apnea ,  breath sounds clear to auscultation  Pulmonary exam normal       Cardiovascular hypertension, On Medications Rhythm:regular Rate:Normal     Neuro/Psych PSYCHIATRIC DISORDERS negative neurological ROS     GI/Hepatic negative GI ROS, Neg liver ROS,   Endo/Other  negative endocrine ROS  Renal/GU negative Renal ROS  negative genitourinary   Musculoskeletal negative musculoskeletal ROS (+)   Abdominal   Peds negative pediatric ROS (+)  Hematology negative hematology ROS (+)   Anesthesia Other Findings   Reproductive/Obstetrics negative OB ROS                             Anesthesia Physical Anesthesia Plan  ASA: III  Anesthesia Plan: General LMA   Post-op Pain Management:    Induction:   Airway Management Planned:   Additional Equipment:   Intra-op Plan:   Post-operative Plan:   Informed Consent: I have reviewed the patients History and Physical, chart, labs and discussed the procedure including the risks, benefits and alternatives for the proposed anesthesia with the patient or authorized representative who has indicated his/her understanding and acceptance.     Plan Discussed with: Anesthesiologist, CRNA and Surgeon  Anesthesia Plan Comments:         Anesthesia Quick Evaluation

## 2014-08-04 LAB — SURGICAL PATHOLOGY

## 2014-08-12 NOTE — Addendum Note (Signed)
Addendum  created 08/12/14 1420 by Stormy FabianLinda Christen Bedoya, CRNA   Modules edited: Anesthesia Responsible Staff

## 2014-09-08 ENCOUNTER — Ambulatory Visit
Admission: RE | Admit: 2014-09-08 | Discharge: 2014-09-08 | Disposition: A | Discharge status: Home / Self Care | Payer: BLUE CROSS/BLUE SHIELD | Source: Ambulatory Visit | Attending: Obstetrics and Gynecology | Admitting: Obstetrics and Gynecology

## 2014-09-08 DIAGNOSIS — Z1231 Encounter for screening mammogram for malignant neoplasm of breast: Secondary | ICD-10-CM | POA: Insufficient documentation

## 2014-12-01 ENCOUNTER — Other Ambulatory Visit: Payer: Self-pay | Admitting: Physical Medicine and Rehabilitation

## 2014-12-01 DIAGNOSIS — M5417 Radiculopathy, lumbosacral region: Secondary | ICD-10-CM

## 2014-12-03 ENCOUNTER — Ambulatory Visit
Admission: RE | Admit: 2014-12-03 | Discharge: 2014-12-03 | Disposition: A | Discharge status: Home / Self Care | Payer: BLUE CROSS/BLUE SHIELD | Source: Ambulatory Visit | Attending: Physical Medicine and Rehabilitation | Admitting: Physical Medicine and Rehabilitation

## 2014-12-03 DIAGNOSIS — M5417 Radiculopathy, lumbosacral region: Secondary | ICD-10-CM

## 2014-12-03 DIAGNOSIS — M5416 Radiculopathy, lumbar region: Secondary | ICD-10-CM | POA: Diagnosis present

## 2014-12-03 DIAGNOSIS — M2548 Effusion, other site: Secondary | ICD-10-CM | POA: Diagnosis not present

## 2014-12-03 DIAGNOSIS — M5386 Other specified dorsopathies, lumbar region: Secondary | ICD-10-CM | POA: Diagnosis not present

## 2014-12-03 DIAGNOSIS — M4856XS Collapsed vertebra, not elsewhere classified, lumbar region, sequela of fracture: Secondary | ICD-10-CM | POA: Diagnosis not present

## 2014-12-08 ENCOUNTER — Other Ambulatory Visit: Payer: BLUE CROSS/BLUE SHIELD

## 2014-12-09 ENCOUNTER — Inpatient Hospital Stay: Admission: RE | Admit: 2014-12-09 | Payer: BLUE CROSS/BLUE SHIELD | Source: Ambulatory Visit

## 2014-12-09 ENCOUNTER — Encounter: Payer: Self-pay | Admitting: *Deleted

## 2014-12-09 NOTE — Patient Instructions (Signed)
  Your procedure is scheduled on: 12-10-14 Report to [facility] 2ND FLOOR @ 10:30 PER PT   Remember: Instructions that are not followed completely may result in serious medical risk, up to and including death, or upon the discretion of your surgeon and anesthesiologist your surgery may need to be rescheduled.    _X__ 1. Do not eat food or drink liquids after midnight. No gum chewing or hard candies.     _X___ 2. No Alcohol for 24 hours before or after surgery.   ____ 3. Bring all medications with you on the day of surgery if instructed.    ____ 4. Notify your doctor if there is any change in your medical condition     (cold, fever, infections).     Do not wear jewelry, make-up, hairpins, clips or nail polish.  Do not wear lotions, powders, or perfumes. You may wear deodorant.  Do not shave 48 hours prior to surgery. Men may shave face and neck.  Do not bring valuables to the hospital.    [facility] is not responsible for any belongings or valuables.               Contacts, dentures or bridgework may not be worn into surgery.  Leave your suitcase in the car. After surgery it may be brought to your room.  For patients admitted to the hospital, discharge time is determined by your treatment team.   Patients discharged the day of surgery will not be allowed to drive home.   Please read over the following fact sheets that you were given:      _X___ Take these medicines the morning of surgery with A SIP OF WATER:    1. CYMBALTA  2. METOPROLOL  3. LOSARTAN  4.  5.  6.  ____ Fleet Enema (as directed)   ____ Use CHG Soap as directed  ____ Use inhalers on the day of surgery  ____ Stop metformin 2 days prior to surgery    ____ Take 1/2 of usual insulin dose the night before surgery and none on the morning of surgery.   ____ Stop Coumadin/Plavix/aspirin-N/A  ____ Stop Anti-inflammatories-TRAMADOL OK TO TAKE   _X___ Stop supplements until after surgery-STOP  BIOTIN AND FISH OIL NOW  ____ Bring C-Pap to the hospital.

## 2014-12-10 ENCOUNTER — Ambulatory Visit
Admission: RE | Admit: 2014-12-10 | Discharge: 2014-12-10 | Disposition: A | Discharge status: Home / Self Care | Payer: BLUE CROSS/BLUE SHIELD | Source: Ambulatory Visit | Attending: Orthopedic Surgery | Admitting: Orthopedic Surgery

## 2014-12-10 ENCOUNTER — Ambulatory Visit: Payer: BLUE CROSS/BLUE SHIELD

## 2014-12-10 ENCOUNTER — Ambulatory Visit: Payer: BLUE CROSS/BLUE SHIELD | Admitting: Anesthesiology

## 2014-12-10 ENCOUNTER — Encounter
Admission: RE | Disposition: A | Discharge status: Home / Self Care | Payer: Self-pay | Source: Ambulatory Visit | Attending: Orthopedic Surgery

## 2014-12-10 DIAGNOSIS — Z8349 Family history of other endocrine, nutritional and metabolic diseases: Secondary | ICD-10-CM | POA: Diagnosis not present

## 2014-12-10 DIAGNOSIS — I10 Essential (primary) hypertension: Secondary | ICD-10-CM | POA: Diagnosis not present

## 2014-12-10 DIAGNOSIS — F419 Anxiety disorder, unspecified: Secondary | ICD-10-CM | POA: Diagnosis not present

## 2014-12-10 DIAGNOSIS — Z79899 Other long term (current) drug therapy: Secondary | ICD-10-CM | POA: Diagnosis not present

## 2014-12-10 DIAGNOSIS — T7840XA Allergy, unspecified, initial encounter: Secondary | ICD-10-CM | POA: Insufficient documentation

## 2014-12-10 DIAGNOSIS — E785 Hyperlipidemia, unspecified: Secondary | ICD-10-CM | POA: Insufficient documentation

## 2014-12-10 DIAGNOSIS — Z801 Family history of malignant neoplasm of trachea, bronchus and lung: Secondary | ICD-10-CM | POA: Insufficient documentation

## 2014-12-10 DIAGNOSIS — Z7952 Long term (current) use of systemic steroids: Secondary | ICD-10-CM | POA: Diagnosis not present

## 2014-12-10 DIAGNOSIS — M4856XA Collapsed vertebra, not elsewhere classified, lumbar region, initial encounter for fracture: Secondary | ICD-10-CM | POA: Diagnosis present

## 2014-12-10 DIAGNOSIS — Z9889 Other specified postprocedural states: Secondary | ICD-10-CM | POA: Insufficient documentation

## 2014-12-10 DIAGNOSIS — F329 Major depressive disorder, single episode, unspecified: Secondary | ICD-10-CM | POA: Diagnosis not present

## 2014-12-10 DIAGNOSIS — K589 Irritable bowel syndrome without diarrhea: Secondary | ICD-10-CM | POA: Insufficient documentation

## 2014-12-10 DIAGNOSIS — X58XXXA Exposure to other specified factors, initial encounter: Secondary | ICD-10-CM | POA: Insufficient documentation

## 2014-12-10 DIAGNOSIS — Z8249 Family history of ischemic heart disease and other diseases of the circulatory system: Secondary | ICD-10-CM | POA: Diagnosis not present

## 2014-12-10 DIAGNOSIS — Z419 Encounter for procedure for purposes other than remedying health state, unspecified: Secondary | ICD-10-CM

## 2014-12-10 DIAGNOSIS — Z90710 Acquired absence of both cervix and uterus: Secondary | ICD-10-CM | POA: Diagnosis not present

## 2014-12-10 DIAGNOSIS — S32010A Wedge compression fracture of first lumbar vertebra, initial encounter for closed fracture: Secondary | ICD-10-CM | POA: Diagnosis not present

## 2014-12-10 HISTORY — PX: KYPHOPLASTY: SHX5884

## 2014-12-10 SURGERY — KYPHOPLASTY
Anesthesia: Monitor Anesthesia Care

## 2014-12-10 MED ORDER — MIDAZOLAM HCL 2 MG/2ML IJ SOLN
INTRAMUSCULAR | Status: DC | PRN
  Administered 2014-12-10: 2 mg via INTRAVENOUS

## 2014-12-10 MED ORDER — IOHEXOL 180 MG/ML  SOLN
INTRAMUSCULAR | Status: DC | PRN
  Administered 2014-12-10: 20 mL

## 2014-12-10 MED ORDER — FAMOTIDINE 20 MG PO TABS
ORAL_TABLET | ORAL | Status: AC
  Administered 2014-12-10: 20 mg
  Filled 2014-12-10: qty 1

## 2014-12-10 MED ORDER — CEFAZOLIN SODIUM-DEXTROSE 2-3 GM-% IV SOLR
INTRAVENOUS | Status: AC
  Filled 2014-12-10: qty 50

## 2014-12-10 MED ORDER — BUPIVACAINE-EPINEPHRINE (PF) 0.5% -1:200000 IJ SOLN
INTRAMUSCULAR | Status: AC
  Filled 2014-12-10: qty 30

## 2014-12-10 MED ORDER — FENTANYL CITRATE (PF) 100 MCG/2ML IJ SOLN
INTRAMUSCULAR | Status: AC
  Administered 2014-12-10: 25 ug via INTRAVENOUS
  Filled 2014-12-10: qty 2

## 2014-12-10 MED ORDER — FAMOTIDINE 20 MG PO TABS: 20.0000 mg | ORAL_TABLET | Freq: Once | ORAL | Status: DC

## 2014-12-10 MED ORDER — HYDROCODONE-ACETAMINOPHEN 5-325 MG PO TABS: 1.0000 | ORAL_TABLET | Freq: Four times a day (QID) | ORAL | Status: DC | PRN

## 2014-12-10 MED ORDER — OXYCODONE HCL 5 MG PO TABS: 5.0000 mg | ORAL_TABLET | Freq: Once | ORAL | Status: DC | PRN

## 2014-12-10 MED ORDER — LIDOCAINE HCL (PF) 1 % IJ SOLN
INTRAMUSCULAR | Status: AC
  Filled 2014-12-10: qty 60

## 2014-12-10 MED ORDER — PROPOFOL 10 MG/ML IV BOLUS
INTRAVENOUS | Status: DC | PRN
  Administered 2014-12-10: 60 mg via INTRAVENOUS
  Administered 2014-12-10 (×3): 10 mg via INTRAVENOUS
  Administered 2014-12-10: 20 mg via INTRAVENOUS
  Administered 2014-12-10: 10 mg via INTRAVENOUS
  Administered 2014-12-10: 15 mg via INTRAVENOUS

## 2014-12-10 MED ORDER — IOHEXOL 240 MG/ML SOLN
INTRAMUSCULAR | Status: AC
  Filled 2014-12-10: qty 200

## 2014-12-10 MED ORDER — LIDOCAINE HCL 1 % IJ SOLN
INTRAMUSCULAR | Status: DC | PRN
  Administered 2014-12-10: 30 mL

## 2014-12-10 MED ORDER — OXYCODONE HCL 5 MG/5ML PO SOLN: 5.0000 mg | Freq: Once | ORAL | Status: DC | PRN

## 2014-12-10 MED ORDER — FENTANYL CITRATE (PF) 100 MCG/2ML IJ SOLN
INTRAMUSCULAR | Status: DC | PRN
  Administered 2014-12-10 (×2): 50 ug via INTRAVENOUS

## 2014-12-10 MED ORDER — LACTATED RINGERS IV SOLN
INTRAVENOUS | Status: DC
  Administered 2014-12-10 (×2): via INTRAVENOUS

## 2014-12-10 MED ORDER — FENTANYL CITRATE (PF) 100 MCG/2ML IJ SOLN
25.0000 ug | INTRAMUSCULAR | Status: DC | PRN
  Administered 2014-12-10 (×4): 25 ug via INTRAVENOUS

## 2014-12-10 MED ORDER — CEFAZOLIN SODIUM-DEXTROSE 2-3 GM-% IV SOLR
2.0000 g | Freq: Once | INTRAVENOUS | Status: AC
  Administered 2014-12-10: 2 g via INTRAVENOUS

## 2014-12-10 MED ORDER — BUPIVACAINE-EPINEPHRINE (PF) 0.5% -1:200000 IJ SOLN
INTRAMUSCULAR | Status: DC | PRN
  Administered 2014-12-10: 20 mL

## 2014-12-10 SURGICAL SUPPLY — 14 items
CEMENT KYPHON CX01A KIT/MIXER (Cement) ×3 IMPLANT
CUP MEDICINE 2OZ PLAST GRAD ST (MISCELLANEOUS) ×3 IMPLANT
DEVICE BIOPSY BONE KYPHX (INSTRUMENTS) ×3 IMPLANT
DRAPE C-ARM XRAY 36X54 (DRAPES) ×3 IMPLANT
DURAPREP 26ML APPLICATOR (WOUND CARE) ×3 IMPLANT
GLOVE SURG ORTHO 9.0 STRL STRW (GLOVE) ×3 IMPLANT
GOWN SPECIALTY ULTRA XL (MISCELLANEOUS) ×3 IMPLANT
GOWN STRL REUS W/ TWL LRG LVL3 (GOWN DISPOSABLE) ×1 IMPLANT
GOWN STRL REUS W/TWL LRG LVL3 (GOWN DISPOSABLE) ×2
LIQUID BAND (GAUZE/BANDAGES/DRESSINGS) ×3 IMPLANT
PACK KYPHOPLASTY (MISCELLANEOUS) ×3 IMPLANT
STRAP SAFETY BODY (MISCELLANEOUS) ×3 IMPLANT
TRAY KYPHOPAK 15/3 EXPRESS 1ST (MISCELLANEOUS) IMPLANT
TRAY KYPHOPAK 20/3 EXPRESS 1ST (MISCELLANEOUS) ×3 IMPLANT

## 2014-12-10 NOTE — Discharge Instructions (Signed)
Percutaneous Vertebroplasty, Care After These instructions give you information on caring for yourself after your procedure. Your doctor may also give you more specific instructions. Call your doctor if you have any problems or questions after your procedure. HOME CARE  Take medicine as told by your doctor.  Keep your wound dry and covered for 24 hours or as told by your doctor.  Ask your doctor when you can bathe or shower.  Put an ice pack on your wound.  Put ice in a plastic bag.  Place a towel between your skin and the bag.  Leave the ice on for 15-20 minutes, 3-4 times a day.  Rest in your bed for 24 hours or as told by your doctor.  Return to normal activities as told by your doctor.  Ask your doctor what stretches and exercises you can do.  Do not bend or lift anything heavy as told by your doctor. GET HELP IF:  Your wound becomes red, puffy (swollen), or tender to the touch.  You are bleeding or leaking fluid from the wound.  You are sick to your stomach (nauseous) or throw up (vomit) for more than 24 hours after the procedure.  Your back pain does not get better.  You have a fever. GET HELP RIGHT AWAY IF:   You have bad back pain that comes on suddenly.  You cannot control when you pee (urinate) or poop (bowel movement).  You lose feeling (numbness) or have tingling in your legs or feet, or they become weak.  You have sudden weakness in your arms or legs.  You have shooting pain down your legs.  You have chest pain or a hard time breathing.  You feel dizzy or pass out (faint).  Your vision changes or you cannot talk as you normally do.   This information is not intended to replace advice given to you by your health care provider. Make sure you discuss any questions you have with your health care provider.   AMBULATORY SURGERY  DISCHARGE INSTRUCTIONS   1) The drugs that you were given will stay in your system until tomorrow so for the next 24 hours  you should not:  A) Drive an automobile B) Make any legal decisions C) Drink any alcoholic beverage   2) You may resume regular meals tomorrow.  Today it is better to start with liquids and gradually work up to solid foods.  You may eat anything you prefer, but it is better to start with liquids, then soup and crackers, and gradually work up to solid foods.  3) Please notify your doctor immediately if you have any unusual bleeding, trouble breathing, redness and pain at the surgery site, drainage, fever, or pain not relieved by medication.  4) Additional Instructions:  Please contact your physician with any problems or Same Day Surgery at [phone], Monday through Friday 6 am to 4 pm, or [place] [facility] Main number at [phone].   Document Released: 04/11/2009 Document Revised: 01/20/2013 Document Reviewed: 09/23/2012 Elsevier Interactive Patient Education [employer]2016 [employer]

## 2014-12-10 NOTE — Anesthesia Postprocedure Evaluation (Signed)
  Anesthesia Post-op Note  Patient: Angela Espinoza  Procedure(s) Performed: Procedure(s): KYPHOPLASTY L 1 (N/A)  Anesthesia type:General, MAC  Patient location: PACU  Post pain: Pain level controlled  Post assessment: Post-op Vital signs reviewed, Patient's Cardiovascular Status Stable, Respiratory Function Stable, Patent Airway and No signs of Nausea or vomiting  Post vital signs: Reviewed and stable  Last Vitals:  Filed Vitals:   12/10/14 1448  BP: 116/58  Pulse: 84  Temp:   Resp: 18    Level of consciousness: awake, alert  and patient cooperative  Complications: No apparent anesthesia complications

## 2014-12-10 NOTE — Anesthesia Preprocedure Evaluation (Signed)
Anesthesia Evaluation  Patient identified by MRN, date of birth, ID band Patient awake    Reviewed: Allergy & Precautions, H&P , NPO status , Patient's Chart, lab work & pertinent test results  History of Anesthesia Complications Negative for: history of anesthetic complications  Airway Mallampati: III  TM Distance: >3 FB Neck ROM: full    Dental no notable dental hx. (+) Teeth Intact   Pulmonary neg shortness of breath, asthma ,    Pulmonary exam normal breath sounds clear to auscultation       Cardiovascular Exercise Tolerance: Good hypertension, (-) angina(-) Past MI negative cardio ROS Normal cardiovascular exam Rhythm:regular Rate:Normal     Neuro/Psych PSYCHIATRIC DISORDERS Anxiety Depression negative neurological ROS     GI/Hepatic negative GI ROS, Neg liver ROS, neg GERD  ,  Endo/Other  negative endocrine ROS  Renal/GU negative Renal ROS  negative genitourinary   Musculoskeletal   Abdominal   Peds  Hematology negative hematology ROS (+)   Anesthesia Other Findings Past Medical History:   Anxiety                                                      Depression                                                   IBS (irritable bowel syndrome)                               Hypertension                                                 Hyperlipidemia                                               Asthma                                                         Comment:NO INHALER-SMOKE AND PERFUME TRIGGERS ASTHMA  Past Surgical History:   OOPHORECTOMY                                                  EXPLORATORY LAPAROTOMY                                        FOOT SURGERY  ABDOMINAL HYSTERECTOMY                                        COLONOSCOPY                                                   TONSILLECTOMY                                                 AXILLARY  LYMPH NODE BIOPSY                      Right 08/03/2014       Comment:Procedure: EXCISION RIGHT  AXILLARY               LIPOMATOSIS;  Surgeon: Nadeen Landau, MD;              Location: [facility] ORS;  Service: General;                Laterality: Right;  BMI    Body Mass Index   27.42 kg/m 2    Signs and symptoms suggestive of sleep apnea    Reproductive/Obstetrics negative OB ROS                             Anesthesia Physical Anesthesia Plan  ASA: III  Anesthesia Plan: General and MAC   Post-op Pain Management:    Induction:   Airway Management Planned:   Additional Equipment:   Intra-op Plan:   Post-operative Plan:   Informed Consent: I have reviewed the patients History and Physical, chart, labs and discussed the procedure including the risks, benefits and alternatives for the proposed anesthesia with the patient or authorized representative who has indicated his/her understanding and acceptance.   Dental Advisory Given  Plan Discussed with: Anesthesiologist, CRNA and Surgeon  Anesthesia Plan Comments:         Anesthesia Quick Evaluation

## 2014-12-10 NOTE — Anesthesia Procedure Notes (Signed)
Procedure Name: MAC Date/Time: 12/10/2014 12:15 PM Performed by: Henrietta HooverPOPE, Benaiah Behan Pre-anesthesia Checklist: Patient identified, Emergency Drugs available, Suction available, Patient being monitored and Timeout performed Oxygen Delivery Method: Nasal cannula

## 2014-12-10 NOTE — Transfer of Care (Signed)
Immediate Anesthesia Transfer of Care Note  Patient: Angela Espinoza  Procedure(s) Performed: Procedure(s): KYPHOPLASTY L 1 (N/A)  Patient Location: PACU  Anesthesia Type:General  Level of Consciousness: awake  Airway & Oxygen Therapy: Patient Spontanous Breathing and Patient connected to face mask oxygen  Post-op Assessment: Report given to RN and Post -op Vital signs reviewed and stable  Post vital signs: Reviewed and stable  Last Vitals:  Filed Vitals:   12/10/14 1329  BP: 126/73  Pulse: 76  Temp: 36 C  Resp: 14    Complications: No apparent anesthesia complications

## 2014-12-10 NOTE — Op Note (Signed)
12/10/2014  1:26 PM  PATIENT:  Angela Espinoza  59 y.o. female  PRE-OPERATIVE DIAGNOSIS:  closed wedge compression fracture L1  POST-OPERATIVE DIAGNOSIS:  closed wedge compression fracture L 1  PROCEDURE:  Procedure(s): KYPHOPLASTY L 1 (N/A)  SURGEON: Leitha SchullerMichael J Shaleen Talamantez, MD  ASSISTANTS: None  ANESTHESIA:   local and MAC  EBL:  Total I/O In: 300 [I.V.:300] Out: -   BLOOD ADMINISTERED:none  DRAINS: none   LOCAL MEDICATIONS USED:  MARCAINE    and XYLOCAINE   SPECIMEN:  Source of Specimen:  L1 vertebral body  DISPOSITION OF SPECIMEN:  PATHOLOGY  COUNTS:  YES  TOURNIQUET:  * No tourniquets in log *  IMPLANTS: Bone cement  DICTATION: .Dragon Dictation patient brought the operating room and after adequate sedation was given the patient was placed prone. C-arm was brought in and good visualization in both AP and lateral projections was obtained. Patient identification and timeout procedures were completed and 5 cc of 1% Xylocaine was infiltrated on both right and left sides of L1 and the area of the planned incisions with alcohol prep first. The back was then prepped and draped in usual sterile fashion and repeat timeout procedure carried out. Spinal needle was used on both sides getting down to the lateral pedicle and a mixture of half percent Sensorcaine with epinephrine and 1% Xylocaine was infiltrated down to the bone and along the tract to the skin. After allowing this to set a small incision was made the right side was approached first with a trocar advanced to the pedicle and into the vertebral body biopsy was obtained drilling was carried out and a balloon inserted and inflated to approximately 2 cc identical procedure carried on the left side. This point the cement was mixed and approximately 5 cc of bone cement was inserted into the vertebral body getting good fill up to the superior endplate fracture. One cemented was set the trochars removed there did not appear to have been  any extravasation and there is good fill on both left and right sides. The skin was closed with Dermabond and when it was dry covered with a Band-Aid.  PLAN OF CARE: Discharge to home after PACU  PATIENT DISPOSITION:  PACU - hemodynamically stable.

## 2014-12-10 NOTE — H&P (Signed)
Reviewed paper H+P, will be scanned into chart. No changes noted.  

## 2014-12-13 ENCOUNTER — Encounter: Payer: Self-pay | Admitting: Orthopedic Surgery

## 2014-12-14 ENCOUNTER — Ambulatory Visit: Payer: BLUE CROSS/BLUE SHIELD

## 2014-12-15 LAB — SURGICAL PATHOLOGY

## 2015-09-07 ENCOUNTER — Encounter: Payer: Self-pay | Admitting: *Deleted

## 2015-09-07 ENCOUNTER — Ambulatory Visit
Admission: RE | Admit: 2015-09-07 | Discharge: 2015-09-07 | Disposition: A | Discharge status: Home / Self Care | Payer: Self-pay | Source: Ambulatory Visit | Attending: Oncology | Admitting: Oncology

## 2015-09-07 ENCOUNTER — Ambulatory Visit: Payer: Self-pay | Attending: Internal Medicine | Admitting: *Deleted

## 2015-09-07 ENCOUNTER — Encounter (INDEPENDENT_AMBULATORY_CARE_PROVIDER_SITE_OTHER): Payer: Self-pay

## 2015-09-07 VITALS — BP 125/83 | HR 88 | Temp 98.3°F | Ht 62.6 in | Wt 146.8 lb

## 2015-09-07 DIAGNOSIS — Z0000 Encounter for general adult medical examination without abnormal findings: Secondary | ICD-10-CM

## 2015-09-07 NOTE — Progress Notes (Signed)
Subjective:     Patient ID: Angela Espinoza, female   DOB: [birth year], 60 y.o.   MRN: [id number]  HPI   Review of Systems     Objective:   Physical Exam  Pulmonary/Chest: Right breast exhibits no inverted nipple, no mass, no nipple discharge, no skin change and no tenderness. Left breast exhibits no inverted nipple, no mass, no nipple discharge, no skin change and no tenderness. Breasts are symmetrical.  Large pendulous breast       Assessment:     60 year [facility] White [facility].  Clinical breast exam unremarkable.  Taught self breast awareness.  Patient has been screened for eligibility.  She does not have any insurance, Medicare or Medicaid.  She also meets financial eligibility.  Hand-out given on the Affordable Care Act.      Plan:     Screening mammogram ordered.  Will follow-up per protocol.

## 2015-09-07 NOTE — Patient Instructions (Signed)
Gave patient hand-out, Women Staying Healthy, Active and Well from [facility], with education on breast health, pap smears, heart and colon health. 

## 2015-09-09 ENCOUNTER — Encounter: Payer: Self-pay | Admitting: *Deleted

## 2015-09-09 NOTE — Progress Notes (Signed)
Letter mailed from the [facility] to inform patient of her normal mammogram results.  Patient is to follow-up with annual screening in one year.  HSIS to Christy. 

## 2016-07-26 ENCOUNTER — Other Ambulatory Visit: Payer: Self-pay | Admitting: Family Medicine

## 2016-07-26 DIAGNOSIS — Z1231 Encounter for screening mammogram for malignant neoplasm of breast: Secondary | ICD-10-CM

## 2016-09-07 ENCOUNTER — Ambulatory Visit
Admission: RE | Admit: 2016-09-07 | Discharge: 2016-09-07 | Disposition: A | Discharge status: Home / Self Care | Payer: BLUE CROSS/BLUE SHIELD | Source: Ambulatory Visit | Attending: Family Medicine | Admitting: Family Medicine

## 2016-09-07 DIAGNOSIS — Z1231 Encounter for screening mammogram for malignant neoplasm of breast: Secondary | ICD-10-CM | POA: Insufficient documentation

## 2017-08-19 ENCOUNTER — Other Ambulatory Visit: Payer: Self-pay | Admitting: Family Medicine

## 2017-08-19 DIAGNOSIS — Z1231 Encounter for screening mammogram for malignant neoplasm of breast: Secondary | ICD-10-CM

## 2017-09-09 ENCOUNTER — Ambulatory Visit
Admission: RE | Admit: 2017-09-09 | Discharge: 2017-09-09 | Disposition: A | Discharge status: Home / Self Care | Payer: Medicare Other | Source: Ambulatory Visit | Attending: Family Medicine | Admitting: Family Medicine

## 2017-09-09 DIAGNOSIS — Z1231 Encounter for screening mammogram for malignant neoplasm of breast: Secondary | ICD-10-CM | POA: Diagnosis present

## 2017-12-10 ENCOUNTER — Ambulatory Visit
Admission: RE | Admit: 2017-12-10 | Discharge: 2017-12-10 | Disposition: A | Discharge status: Home / Self Care | Payer: Medicare Other | Source: Ambulatory Visit | Attending: Nurse Practitioner | Admitting: Nurse Practitioner

## 2017-12-10 ENCOUNTER — Other Ambulatory Visit: Payer: Self-pay

## 2017-12-10 ENCOUNTER — Encounter: Payer: Self-pay | Admitting: Nurse Practitioner

## 2017-12-10 ENCOUNTER — Ambulatory Visit (HOSPITAL_BASED_OUTPATIENT_CLINIC_OR_DEPARTMENT_OTHER): Payer: Medicare Other | Admitting: Nurse Practitioner

## 2017-12-10 DIAGNOSIS — Z789 Other specified health status: Secondary | ICD-10-CM | POA: Insufficient documentation

## 2017-12-10 DIAGNOSIS — I10 Essential (primary) hypertension: Secondary | ICD-10-CM | POA: Insufficient documentation

## 2017-12-10 DIAGNOSIS — Z79899 Other long term (current) drug therapy: Secondary | ICD-10-CM | POA: Insufficient documentation

## 2017-12-10 DIAGNOSIS — T7840XA Allergy, unspecified, initial encounter: Secondary | ICD-10-CM | POA: Insufficient documentation

## 2017-12-10 DIAGNOSIS — M545 Low back pain, unspecified: Secondary | ICD-10-CM

## 2017-12-10 DIAGNOSIS — M8448XA Pathological fracture, other site, initial encounter for fracture: Secondary | ICD-10-CM | POA: Insufficient documentation

## 2017-12-10 DIAGNOSIS — J45909 Unspecified asthma, uncomplicated: Secondary | ICD-10-CM | POA: Insufficient documentation

## 2017-12-10 DIAGNOSIS — G894 Chronic pain syndrome: Secondary | ICD-10-CM | POA: Insufficient documentation

## 2017-12-10 DIAGNOSIS — F329 Major depressive disorder, single episode, unspecified: Secondary | ICD-10-CM | POA: Insufficient documentation

## 2017-12-10 DIAGNOSIS — F419 Anxiety disorder, unspecified: Secondary | ICD-10-CM | POA: Insufficient documentation

## 2017-12-10 DIAGNOSIS — M899 Disorder of bone, unspecified: Secondary | ICD-10-CM | POA: Diagnosis not present

## 2017-12-10 DIAGNOSIS — F32A Depression, unspecified: Secondary | ICD-10-CM | POA: Insufficient documentation

## 2017-12-10 DIAGNOSIS — G8929 Other chronic pain: Secondary | ICD-10-CM | POA: Insufficient documentation

## 2017-12-10 DIAGNOSIS — M47816 Spondylosis without myelopathy or radiculopathy, lumbar region: Secondary | ICD-10-CM | POA: Insufficient documentation

## 2017-12-10 DIAGNOSIS — Z5181 Encounter for therapeutic drug level monitoring: Secondary | ICD-10-CM | POA: Insufficient documentation

## 2017-12-10 NOTE — Patient Instructions (Addendum)
__Follow up with labs and x-ray.  __________________________________________________________________________________________  Appointment Policy Summary  It is our goal and responsibility to provide the medical community with assistance in the evaluation and management of patients with chronic pain. Unfortunately our resources are limited. Because we do not have an unlimited amount of time, or available appointments, we are required to closely monitor and manage their use. The following rules exist to maximize their use:  Patient's responsibilities: 1. Punctuality:  At what time should I arrive? You should be physically present in our office 30 minutes before your scheduled appointment. Your scheduled appointment is with your assigned healthcare provider. However, it takes 5-10 minutes to be "checked-in", and another 15 minutes for the nurses to do the admission. If you arrive to our office at the time you were given for your appointment, you will end up being at least 20-25 minutes late to your appointment with the provider. 2. Tardiness:  What happens if I arrive only a few minutes after my scheduled appointment time? You will need to reschedule your appointment. The cutoff is your appointment time. This is why it is so important that you arrive at least 30 minutes before that appointment. If you have an appointment scheduled for 10:00 AM and you arrive at 10:01, you will be required to reschedule your appointment.  3. Plan ahead:  Always assume that you will encounter traffic on your way in. Plan for it. If you are dependent on a driver, make sure they understand these rules and the need to arrive early. 4. Other appointments and responsibilities:  Avoid scheduling any other appointments before or after your pain clinic appointments.  5. Be prepared:  Write down everything that you need to discuss with your healthcare provider and give this information to the admitting nurse. Write down the  medications that you will need refilled. Bring your pills and bottles (even the empty ones), to all of your appointments, except for those where a procedure is scheduled. 6. No children or pets:  Find someone to take care of them. It is not appropriate to bring them in. 7. Scheduling changes:  We request "advanced notification" of any changes or cancellations. 8. Advanced notification:  Defined as a time period of more than 24 hours prior to the originally scheduled appointment. This allows for the appointment to be offered to other patients. 9. Rescheduling:  When a visit is rescheduled, it will require the cancellation of the original appointment. For this reason they both fall within the category of "Cancellations".  10. Cancellations:  They require advanced notification. Any cancellation less than 24 hours before the  appointment will be recorded as a "No Show". 11. No Show:  Defined as an unkept appointment where the patient failed to notify or declare to the practice their intention or inability to keep the appointment.  Corrective process for repeat offenders:  1. Tardiness: Three (3) episodes of rescheduling due to late arrivals will be recorded as one (1) "No Show". 2. Cancellation or reschedule: Three (3) cancellations or rescheduling will be recorded as one (1) "No Show". 3. "No Shows": Three (3) "No Shows" within a 12 month period will result in discharge from the practice. ____________________________________________________________________________________________   ______________________________________________________________________________________________  Specialty Pain Scale  Introduction:  There are significant differences in how pain is reported. The word pain usually refers to physical pain, but it is also a common synonym of suffering. The medical community uses a scale from 0 (zero) to 10 (ten) to report pain level.  Zero (0) is described as "no pain", while ten (10)  is described as "the worse pain you can imagine". The problem with this scale is that physical pain is reported along with suffering. Suffering refers to mental pain, or more often yet it refers to any unpleasant feeling, emotion or aversion associated with the perception of harm or threat of harm. It is the psychological component of pain.  Pain Specialists prefer to separate the two components. The pain scale used by this practice is the Verbal Numerical Rating Scale (VNRS-11). This scale is for the physical pain only. DO NOT INCLUDE how your pain psychologically affects you. This scale is for adults 63 years of age and older. It has 11 (eleven) levels. The 1st level is 0/10. This means: "right now, I have no pain". In the context of pain management, it also means: "right now, my physical pain is under control with the current therapy".  General Information:  The scale should reflect your current level of pain. Unless you are specifically asked for the level of your worst pain, or your average pain. If you are asked for one of these two, then it should be understood that it is over the past 24 hours.  Levels 1 (one) through 5 (five) are described below, and can be treated as an outpatient. Ambulatory pain management facilities such as ours are more than adequate to treat these levels. Levels 6 (six) through 10 (ten) are also described below, however, these must be treated as a hospitalized patient. While levels 6 (six) and 7 (seven) may be evaluated at an urgent care facility, levels 8 (eight) through 10 (ten) constitute medical emergencies and as such, they belong in a hospital's emergency department. When having these levels (as described below), do not come to our office. Our facility is not equipped to manage these levels. Go directly to an urgent care facility or an emergency department to be evaluated.  Definitions:  Activities of Daily Living (ADL): Activities of daily living (ADL or ADLs) is a  term used in healthcare to refer to people's daily self-care activities. Health professionals often use a person's ability or inability to perform ADLs as a measurement of their functional status, particularly in regard to people post injury, with disabilities and the elderly. There are two ADL levels: Basic and Instrumental. Basic Activities of Daily Living (BADL  or BADLs) consist of self-care tasks that include: Bathing and showering; personal hygiene and grooming (including brushing/combing/styling hair); dressing; Toilet hygiene (getting to the toilet, cleaning oneself, and getting back up); eating and self-feeding (not including cooking or chewing and swallowing); functional mobility, often referred to as "transferring", as measured by the ability to walk, get in and out of bed, and get into and out of a chair; the broader definition (moving from one place to another while performing activities) is useful for people with different physical abilities who are still able to get around independently. Basic ADLs include the things many people do when they get up in the morning and get ready to go out of the house: get out of bed, go to the toilet, bathe, dress, groom, and eat. On the average, loss of function typically follows a particular order. Hygiene is the first to go, followed by loss of toilet use and locomotion. The last to go is the ability to eat. When there is only one remaining area in which the person is independent, there is a 62.9% chance that it is eating and only a 3.5%  chance that it is hygiene. Instrumental Activities of Daily Living (IADL or IADLs) are not necessary for fundamental functioning, but they let an individual live independently in a community. IADL consist of tasks that include: cleaning and maintaining the house; home establishment and maintenance; care of others (including selecting and supervising caregivers); care of pets; child rearing; managing money; managing financials  (investments, etc.); meal preparation and cleanup; shopping for groceries and necessities; moving within the community; safety procedures and emergency responses; health management and maintenance (taking prescribed medications); and using the telephone or other form of communication.  Instructions:  Most patients tend to report their pain as a combination of two factors, their physical pain and their psychosocial pain. This last one is also known as "suffering" and it is reflection of how physical pain affects you socially and psychologically. From now on, report them separately.  From this point on, when asked to report your pain level, report only your physical pain. Use the following table for reference.  Pain Clinic Pain Levels (0-5/10)  Pain Level Score  Description  No Pain 0   Mild pain 1 Nagging, annoying, but does not interfere with basic activities of daily living (ADL). Patients are able to eat, bathe, get dressed, toileting (being able to get on and off the toilet and perform personal hygiene functions), transfer (move in and out of bed or a chair without assistance), and maintain continence (able to control bladder and bowel functions). Blood pressure and heart rate are unaffected. A normal heart rate for a healthy adult ranges from 60 to 100 bpm (beats per minute).   Mild to moderate pain 2 Noticeable and distracting. Impossible to hide from other people. More frequent flare-ups. Still possible to adapt and function close to normal. It can be very annoying and may have occasional stronger flare-ups. With discipline, patients may get used to it and adapt.   Moderate pain 3 Interferes significantly with activities of daily living (ADL). It becomes difficult to feed, bathe, get dressed, get on and off the toilet or to perform personal hygiene functions. Difficult to get in and out of bed or a chair without assistance. Very distracting. With effort, it can be ignored when deeply involved in  activities.   Moderately severe pain 4 Impossible to ignore for more than a few minutes. With effort, patients may still be able to manage work or participate in some social activities. Very difficult to concentrate. Signs of autonomic nervous system discharge are evident: dilated pupils (mydriasis); mild sweating (diaphoresis); sleep interference. Heart rate becomes elevated (>115 bpm). Diastolic blood pressure (lower number) rises above 100 mmHg. Patients find relief in laying down and not moving.   Severe pain 5 Intense and extremely unpleasant. Associated with frowning face and frequent crying. Pain overwhelms the senses.  Ability to do any activity or maintain social relationships becomes significantly limited. Conversation becomes difficult. Pacing back and forth is common, as getting into a comfortable position is nearly impossible. Pain wakes you up from deep sleep. Physical signs will be obvious: pupillary dilation; increased sweating; goosebumps; brisk reflexes; cold, clammy hands and feet; nausea, vomiting or dry heaves; loss of appetite; significant sleep disturbance with inability to fall asleep or to remain asleep. When persistent, significant weight loss is observed due to the complete loss of appetite and sleep deprivation.  Blood pressure and heart rate becomes significantly elevated. Caution: If elevated blood pressure triggers a pounding headache associated with blurred vision, then the patient should immediately seek attention at  an urgent or emergency care unit, as these may be signs of an impending stroke.    Emergency Department Pain Levels (6-10/10)  Emergency Room Pain 6 Severely limiting. Requires emergency care and should not be seen or managed at an outpatient pain management facility. Communication becomes difficult and requires great effort. Assistance to reach the emergency department may be required. Facial flushing and profuse sweating along with potentially dangerous  increases in heart rate and blood pressure will be evident.   Distressing pain 7 Self-care is very difficult. Assistance is required to transport, or use restroom. Assistance to reach the emergency department will be required. Tasks requiring coordination, such as bathing and getting dressed become very difficult.   Disabling pain 8 Self-care is no longer possible. At this level, pain is disabling. The individual is unable to do even the most "basic" activities such as walking, eating, bathing, dressing, transferring to a bed, or toileting. Fine motor skills are lost. It is difficult to think clearly.   Incapacitating pain 9 Pain becomes incapacitating. Thought processing is no longer possible. Difficult to remember your own name. Control of movement and coordination are lost.   The worst pain imaginable 10 At this level, most patients pass out from pain. When this level is reached, collapse of the autonomic nervous system occurs, leading to a sudden drop in blood pressure and heart rate. This in turn results in a temporary and dramatic drop in blood flow to the brain, leading to a loss of consciousness. Fainting is one of the body's self defense mechanisms. Passing out puts the brain in a calmed state and causes it to shut down for a while, in order to begin the healing process.    Summary: 1. Refer to this scale when providing [place] with your pain level. 2. Be accurate and careful when reporting your pain level. This will help with your care. 3. Over-reporting your pain level will lead to loss of credibility. 4. Even a level of 1/10 means that there is pain and will be treated at our facility. 5. High, inaccurate reporting will be documented as "Symptom Exaggeration", leading to loss of credibility and suspicions of possible secondary gains such as obtaining more narcotics, or wanting to appear disabled, for fraudulent reasons. 6. Only pain levels of 5 or below will be seen at our facility. 7. Pain  levels of 6 and above will be sent to the Emergency Department and the appointment cancelled. ______________________________________________________________________________________________

## 2017-12-10 NOTE — Progress Notes (Signed)
Patient's Name: Angela Espinoza  MRN: [id number]  Referring Provider: Gennette Pac, FNP  DOB: 1955/11/24  PCP: Gennette Pac, FNP  DOS: 12/10/2017  Note by: Dionisio David NP  Service setting: Ambulatory outpatient  Specialty: Interventional Pain Management  Location: [facility] (AMB) Pain Management Facility    Patient type: New Patient    Primary Reason(s) for Visit: Initial Patient Evaluation CC: New Patient (Initial Visit) and Back Pain  HPI  Angela Espinoza is a 62 y.o. year old, female patient, who comes today for an initial evaluation. She has Allergy; Anxiety; Asthma without status asthmaticus; Depression; Hypertension; Postmenopause; Chronic bilateral low back pain without sciatica (Primary Area of Pain) (midline); Chronic pain syndrome; Pharmacologic therapy; Disorder of skeletal system; and Problems influencing health status on their problem list.. Her primarily concern today is the New Patient (Initial Visit) and Back Pain  Pain Assessment: Location: Mid Back Radiating: Denies  Onset: More than a month ago Duration: Chronic pain Quality: Stabbing, Sharp Severity: 8 /10 (subjective, self-reported pain score)  Note: Reported level is compatible with observation. Clinically the patient looks like a 0/10       Information on the proper use of the pain scale provided to the patient today. When using our objective Pain Scale, levels between 6 and 10/10 are said to belong in an emergency room, as it progressively worsens from a 6/10, described as severely limiting, requiring emergency care not usually available at an outpatient pain management facility. At a 6/10 level, communication becomes difficult and requires great effort. Assistance to reach the emergency department may be required. Facial flushing and profuse sweating along with potentially dangerous increases in heart rate and blood pressure will be evident. Effect on ADL: "Not able to do some household chores and have to take frequent  breaks"  Timing: Constant Modifying factors: Cold packs, hot packs, medications, resting, and physical therapy  BP: (!) 107/42  HR: 79  Onset and Duration: Sudden Cause of pain: lawn mower accident 2016 Severity: Getting worse, NAS-11 at its worse: 10/10, NAS-11 at its best: 8/10, NAS-11 now: 8/10 and NAS-11 on the average: 8/10 Timing: After activity or exercise Aggravating Factors: Prolonged sitting and Prolonged standing Alleviating Factors: Cold packs, Hot packs, Medications, Resting and physical therapy-water [employer] Associated Problems: Depression Quality of Pain: Sharp and Stabbing Previous Examinations or Tests: MRI scan, Nerve block and X-rays Previous Treatments: Pool exercises and Radiofrequency  The patient comes into the clinics today for the first time for a chronic pain management evaluation.  According to the patient her primary area pain is in her lower back.  She admits that she suffered a fracture to L1 after running her lawn mower often an embankment.  She is status post kyphoplasty.  She admits that she is a patient of [facility] and has had nerve blocks and a radiofrequency (October 14, 2017).  She admits that she did have 3 months of physical therapy.  She is here for medication management.  Medication management is not an option at the spine center.   Today I took the time to provide the patient with information regarding this pain practice. The patient was informed that the practice is divided into two sections: an interventional pain management section, as well as a completely separate and distinct medication management section. I explained that there are procedure days for interventional therapies, and evaluation days for follow-ups and medication management. Because of the amount of documentation required during both, they are kept separated. This means that  there is the possibility that he may be scheduled for a procedure on one day, and medication management the  next. I have also informed her that because of staffing and facility limitations, this practice will no longer take patients for medication management only. To illustrate the reasons for this, I gave the patient the example of surgeons, and how inappropriate it would be to refer a patient to his/her care, just to write for the post-surgical antibiotics on a surgery done by a different surgeon.   Because interventional pain management is part of the board-certified specialty for the doctors, the patient was informed that joining this practice means that they are open to any and all interventional therapies. I made it clear that this does not mean that they will be forced to have any procedures done. What this means is that I believe interventional therapies to be essential part of the diagnosis and proper management of chronic pain conditions. Therefore, patients not interested in these interventional alternatives will be better served under the care of a different practitioner.  The patient was also made aware of my Comprehensive Pain Management Safety Guidelines where by joining this practice, they limit all of their nerve blocks and joint injections to those done by our practice, for as long as we are retained to manage their care. Historic Controlled Substance Pharmacotherapy Review  PMP and historical list of controlled substances: Alprazolam 0.25 grams, tramadol 50 mg, hydrocodone/acetaminophen 5/325 mg, Highest opioid analgesic regimen found: Total/acetaminophen 5/325 2 tablet 4 times daily (fill date 04/13/2013) hydrocodone 40 mg/day Most recent opioid analgesic: None Current opioid analgesics: None Highest recorded MME/day: 40 mg/day MME/day: 0 mg/day Medications: The patient did not bring the medication(s) to the appointment, as requested in our "New Patient Package" Pharmacodynamics: Desired effects: Analgesia: The patient reports >50% benefit. Reported improvement in function: The patient  reports medication allows her to accomplish basic ADLs. Clinically meaningful improvement in function (CMIF): Sustained CMIF goals met Perceived effectiveness: Described as relatively effective, allowing for increase in activities of daily living (ADL) Undesirable effects: Side-effects or Adverse reactions: None reported Historical Monitoring: The patient  reports that she does not use drugs. List of all UDS Test(s): No results found for: MDMA, COCAINSCRNUR, PCPSCRNUR, PCPQUANT, CANNABQUANT, THCU, [place] List of all Serum Drug Screening Test(s):  No results found for: AMPHSCRSER, BARBSCRSER, BENZOSCRSER, COCAINSCRSER, PCPSCRSER, PCPQUANT, THCSCRSER, CANNABQUANT, OPIATESCRSER, OXYSCRSER, PROPOXSCRSER Historical Background Evaluation: [place] PDMP: Six (6) year initial data search conducted.             [place] Department of public safety, offender search: Editor, commissioning Information) Non-contributory Risk Assessment Profile: Aberrant behavior: None observed or detected today Risk factors for fatal opioid overdose: None identified today Fatal overdose hazard ratio (HR): Calculation deferred Non-fatal overdose hazard ratio (HR): Calculation deferred Risk of opioid abuse or dependence: 0.7-3.0% with doses ? 36 MME/day and 6.1-26% with doses ? 120 MME/day. Substance use disorder (SUD) risk level: Pending results of Medical Psychology Evaluation for SUD Opioid risk tool (ORT) (Total Score): 1  ORT Scoring interpretation table:  Score <3 = Low Risk for SUD  Score between 4-7 = Moderate Risk for SUD  Score >8 = High Risk for Opioid Abuse   PHQ-2 Depression Scale:  Total score: 0  PHQ-2 Scoring interpretation table: (Score and probability of major depressive disorder)  Score 0 = No depression  Score 1 = 15.4% Probability  Score 2 = 21.1% Probability  Score 3 = 38.4% Probability  Score 4 = 45.5% Probability  Score 5 = 56.4% Probability  Score 6 = 78.6% Probability   PHQ-9 Depression Scale:  Total score:  0  PHQ-9 Scoring interpretation table:  Score 0-4 = No depression  Score 5-9 = Mild depression  Score 10-14 = Moderate depression  Score 15-19 = Moderately severe depression  Score 20-27 = Severe depression (2.4 times higher risk of SUD and 2.89 times higher risk of overuse)   Pharmacologic Plan: Pending ordered tests and/or consults  Meds  The patient has a current medication list which includes the following prescription(s): amitriptyline, biotin, black pepper-turmeric, cholecalciferol, cyclobenzaprine, duloxetine, hydrochlorothiazide, levocetirizine, losartan, magnesium, meloxicam, metoprolol succinate, multivitamin, omega-3 fatty acids, and vitamin c.  Current Outpatient Medications on File Prior to Visit  Medication Sig  . amitriptyline (ELAVIL) 25 MG tablet Take 25 mg by mouth at bedtime.  . Biotin 1000 MCG CHEW Chew by mouth.  . Black Pepper-Turmeric (TURMERIC COMPLEX/BLACK PEPPER) 3-500 MG CAPS Take by mouth.  . Cholecalciferol (D3-1000) 25 MCG (1000 UT) tablet Take 1,000 Units by mouth daily.  . cyclobenzaprine (FLEXERIL) 5 MG tablet Take by mouth 2 (two) times daily.   . DULoxetine (CYMBALTA) 30 MG capsule Take 30 mg by mouth 2 (two) times daily.  . hydrochlorothiazide (MICROZIDE) 12.5 MG capsule Take 12.5 mg by mouth daily.  Marland Kitchen levocetirizine (XYZAL) 5 MG tablet Take by mouth.  . losartan (COZAAR) 25 MG tablet Take 25 mg by mouth every morning.  . Magnesium 250 MG TABS Take by mouth.  . meloxicam (MOBIC) 15 MG tablet Take 15 mg by mouth daily.  . metoprolol succinate (TOPROL-XL) 25 MG 24 hr tablet Take 25 mg by mouth every morning.  . Multiple Vitamin (MULTIVITAMIN) capsule Take 1 capsule by mouth daily.  . Omega-3 Fatty Acids (FISH OIL PO) Take 1 tablet by mouth daily.  . vitamin C (ASCORBIC ACID) 500 MG tablet Take 1,000 mg by mouth daily.    No current facility-administered medications on file prior to visit.    Imaging Review  Lumbosacral Imaging: Lumbar MR wo  contrast:  Results for orders placed during the hospital encounter of 12/03/14  MR Lumbar Spine Wo Contrast   Narrative CLINICAL DATA:  Low back pain since the patient had a riding lawnmower accident on 09/10/2014.  EXAM: MRI LUMBAR SPINE WITHOUT CONTRAST  TECHNIQUE: Multiplanar, multisequence MR imaging of the lumbar spine was performed. No intravenous contrast was administered.  COMPARISON:  None.  FINDINGS: Normal conus tip at L2.  Normal paraspinal soft tissues.  T10-11 and T11-12: Negative.  T12-L1: Benign non acute compression fracture of the superior aspect of L1. There is 5 mm of protrusion of the posterior superior aspect of the L1 vertebral body into the spinal canal without neural impingement. No abnormal signal from the adjacent spinal cord. No disc protrusion.  L1-2 through L5-S1: Normal.  IMPRESSION: Old benign-appearing compression fracture of the superior aspect of L1 with slight bone protrusion into the spinal canal without neural impingement. There is minimal edema in the superior endplate of L1 consistent with the history of a fracture and August 2016.  Otherwise, normal exam.   Electronically Signed   By: Lorriane Shire M.D.   On: 12/03/2014 14:18   Lumbar DG 2-3 views:  Results for orders placed during the hospital encounter of 12/10/14  DG Lumbar Spine 2-3 Views   Narrative CLINICAL DATA:  L1 compression deformity  EXAM: LUMBAR SPINE - 2-3 VIEW; DG C-ARM 61-120 MIN  COMPARISON:  12/03/2014  FLUOROSCOPY TIME:  Radiation Exposure  Index (as provided by the fluoroscopic device): Not available  If the device does not provide the exposure index:  Fluoroscopy Time:  2 minutes  Number of Acquired Images:  2  FINDINGS: Contrast laden cement is noted throughout the L1 vertebral body consistent with the recent kyphoplasty procedure. No significant extravasation is noted.  IMPRESSION: Status post L1 kyphoplasty   Electronically Signed    By: Inez Catalina M.D.   On: 12/10/2014 14:02   .  Note: Available results from prior imaging studies were reviewed.        ROS  Cardiovascular History: High blood pressure Pulmonary or Respiratory History: No reported pulmonary signs or symptoms such as wheezing and difficulty taking a deep full breath (Asthma), difficulty blowing air out (Emphysema), coughing up mucus (Bronchitis), persistent dry cough, or temporary stoppage of breathing during sleep Neurological History: No reported neurological signs or symptoms such as seizures, abnormal skin sensations, urinary and/or fecal incontinence, being born with an abnormal open spine and/or a tethered spinal cord Review of Past Neurological Studies: No results found for this or any previous visit. Psychological-Psychiatric History: Anxiousness and Depressed Gastrointestinal History: No reported gastrointestinal signs or symptoms such as vomiting or evacuating blood, reflux, heartburn, alternating episodes of diarrhea and constipation, inflamed or scarred liver, or pancreas or irrregular and/or infrequent bowel movements Genitourinary History: No reported renal or genitourinary signs or symptoms such as difficulty voiding or producing urine, peeing blood, non-functioning kidney, kidney stones, difficulty emptying the bladder, difficulty controlling the flow of urine, or chronic kidney disease Hematological History: No reported hematological signs or symptoms such as prolonged bleeding, low or poor functioning platelets, bruising or bleeding easily, hereditary bleeding problems, low energy levels due to low hemoglobin or being anemic Endocrine History: No reported endocrine signs or symptoms such as high or low blood sugar, rapid heart rate due to high thyroid levels, obesity or weight gain due to slow thyroid or thyroid disease Rheumatologic History: No reported rheumatological signs and symptoms such as fatigue, joint pain, tenderness, swelling,  redness, heat, stiffness, decreased range of motion, with or without associated rash Musculoskeletal History: Negative for myasthenia gravis, muscular dystrophy, multiple sclerosis or malignant hyperthermia Work History: Retired  Allergies  Angela Espinoza is allergic to indapamide.  Laboratory Chemistry  Inflammation Markers No results found for: CRP, ESRSEDRATE (CRP: Acute Phase) (ESR: Chronic Phase) Renal Function Markers Lab Results  Component Value Date   BUN 5 (L) 08/12/2011   CREATININE 0.66 08/12/2011   GFRAA >60 08/12/2011   GFRNONAA >60 08/12/2011   Hepatic Function Markers Lab Results  Component Value Date   AST 51 (H) 08/10/2011   ALT 42 08/10/2011   ALBUMIN 4.2 08/10/2011   ALKPHOS 88 08/10/2011   Electrolytes Lab Results  Component Value Date   NA 138 08/12/2011   K 4.4 08/12/2011   CL 104 08/12/2011   CALCIUM 8.5 08/12/2011   MG 2.2 08/11/2011   Neuropathy Markers No results found for: [id number] Bone Pathology Markers Lab Results  Component Value Date   ALKPHOS 88 08/10/2011   CALCIUM 8.5 08/12/2011   Coagulation Parameters Lab Results  Component Value Date   PLT 255 08/12/2011   Cardiovascular Markers Lab Results  Component Value Date   HGB 12.4 08/12/2011   HCT 35.0 08/12/2011   Note: Lab results reviewed.  PFSH  Drug: Angela Espinoza  reports that she does not use drugs. Alcohol:  reports that she drinks alcohol. Tobacco:  reports that she is a non-smoker but has  been exposed to tobacco smoke. She has never used smokeless tobacco. Medical:  has a past medical history of Anxiety, Asthma, Depression, Hyperlipidemia, Hypertension, and IBS (irritable bowel syndrome). Family: family history is not on file.  Past Surgical History:  Procedure Laterality Date  . ABDOMINAL HYSTERECTOMY    . AXILLARY LYMPH NODE BIOPSY Right 08/03/2014   Procedure: EXCISION RIGHT  AXILLARY LIPOMATOSIS;  Surgeon: Leonie Green, MD;  Location: [facility] ORS;   Service: General;  Laterality: Right;  . COLONOSCOPY    . EXPLORATORY LAPAROTOMY    . FOOT SURGERY    . KYPHOPLASTY N/A 12/10/2014   Procedure: KYPHOPLASTY L 1;  Surgeon: Hessie Knows, MD;  Location: [facility] ORS;  Service: Orthopedics;  Laterality: N/A;  . OOPHORECTOMY    . TONSILLECTOMY     Active Ambulatory Problems    Diagnosis Date Noted  . Allergy 12/10/2017  . Anxiety 12/10/2017  . Asthma without status asthmaticus 12/10/2017  . Depression 12/10/2017  . Hypertension 12/10/2017  . Postmenopause 06/02/2014  . Chronic bilateral low back pain without sciatica (Primary Area of Pain) (midline) 12/10/2017  . Chronic pain syndrome 12/10/2017  . Pharmacologic therapy 12/10/2017  . Disorder of skeletal system 12/10/2017  . Problems influencing health status 12/10/2017   Resolved Ambulatory Problems    Diagnosis Date Noted  . No Resolved Ambulatory Problems   Past Medical History:  Diagnosis Date  . Asthma   . Hyperlipidemia   . IBS (irritable bowel syndrome)    Constitutional Exam  General appearance: Well nourished, well developed, and well hydrated. In no apparent acute distress Vitals:   12/10/17 1422  BP: (!) 107/42  Pulse: 79  Resp: 18  Temp: 98.6 F (37 C)  SpO2: 100%  Weight: 145 lb (65.8 kg)  Height: 5' 2"  (1.575 m)   BMI Assessment: Estimated body mass index is 26.52 kg/m as calculated from the following:   Height as of this encounter: 5' 2"  (1.575 m).   Weight as of this encounter: 145 lb (65.8 kg).  BMI interpretation table: BMI level Category Range association with higher incidence of chronic pain  <18 kg/m2 Underweight   18.5-24.9 kg/m2 Ideal body weight   25-29.9 kg/m2 Overweight Increased incidence by 20%  30-34.9 kg/m2 Obese (Class I) Increased incidence by 68%  35-39.9 kg/m2 Severe obesity (Class II) Increased incidence by 136%  >40 kg/m2 Extreme obesity (Class III) Increased incidence by 254%   BMI Readings from Last 4 Encounters:  12/10/17  26.52 kg/m  09/07/15 26.34 kg/m  12/10/14 27.44 kg/m  08/03/14 28.35 kg/m   Wt Readings from Last 4 Encounters:  12/10/17 145 lb (65.8 kg)  09/07/15 146 lb 13.2 oz (66.6 kg)  12/10/14 150 lb (68 kg)  08/03/14 155 lb (70.3 kg)  Psych/Mental status: Alert, oriented x 3 (person, place, & time)       Eyes: PERLA Respiratory: No evidence of acute respiratory distress  Cervical Spine Exam  Inspection: No masses, redness, or swelling Alignment: Symmetrical Functional ROM: Unrestricted ROM      Stability: No instability detected Muscle strength & Tone: Functionally intact Sensory: Unimpaired Palpation: No palpable anomalies              Upper Extremity (UE) Exam    Side: Right upper extremity  Side: Left upper extremity  Inspection: No masses, redness, swelling, or asymmetry. No contractures  Inspection: No masses, redness, swelling, or asymmetry. No contractures  Functional ROM: Unrestricted ROM  Functional ROM: Unrestricted ROM          Muscle strength & Tone: Functionally intact  Muscle strength & Tone: Functionally intact  Sensory: Unimpaired  Sensory: Unimpaired  Palpation: No palpable anomalies              Palpation: No palpable anomalies              Specialized Test(s): Deferred         Specialized Test(s): Deferred          Thoracic Spine Exam  Inspection: No masses, redness, or swelling Alignment: Symmetrical Functional ROM: Unrestricted ROM Stability: No instability detected Sensory: Unimpaired Muscle strength & Tone: No palpable anomalies  Lumbar Spine Exam  Inspection: Well healed scar from previous spine surgery detected Alignment: Symmetrical Functional ROM: Unrestricted ROM      Stability: No instability detected Muscle strength & Tone: Functionally intact Sensory: Unimpaired Palpation: Non-tender       Provocative Tests: Lumbar Hyperextension and rotation test: Negative       Patrick's Maneuver: Negative                    Gait & Posture  Assessment  Ambulation: Unassisted Gait: Relatively normal for age and body habitus Posture: WNL   Lower Extremity Exam    Side: Right lower extremity  Side: Left lower extremity  Inspection: No masses, redness, swelling, or asymmetry. No contractures  Inspection: No masses, redness, swelling, or asymmetry. No contractures  Functional ROM: Unrestricted ROM          Functional ROM: Unrestricted ROM          Muscle strength & Tone: Able to Toe-walk & Heel-walk without problems  Muscle strength & Tone: Able to Toe-walk & Heel-walk without problems  Sensory: Unimpaired  Sensory: Unimpaired  Palpation: No palpable anomalies  Palpation: No palpable anomalies   Assessment  Primary Diagnosis & Pertinent Problem List: Diagnoses of Chronic bilateral low back pain without sciatica (Primary Area of Pain) (midline), Chronic pain syndrome, Pharmacologic therapy, Disorder of skeletal system, and Problems influencing health status were pertinent to this visit.  Visit Diagnosis: 1. Chronic bilateral low back pain without sciatica (Primary Area of Pain) (midline)   2. Chronic pain syndrome   3. Pharmacologic therapy   4. Disorder of skeletal system   5. Problems influencing health status    Plan of Care  Initial treatment plan:  Please be advised that as per protocol, today's visit has been an evaluation only. We have not taken over the patient's controlled substance management.  Problem-specific plan: No problem-specific Assessment & Plan notes found for this encounter.  Ordered Lab-work, Procedure(s), Referral(s), & Consult(s): Orders Placed This Encounter  Procedures  . DG Lumbar Spine Complete W/Bend  . Compliance Drug Analysis, Ur  . Magnesium  . Vitamin B12  . Sedimentation rate  . 25-Hydroxyvitamin D Lcms D2+D3  . C-reactive protein   Pharmacotherapy: Medications ordered:  No orders of the defined types were placed in this encounter.  Medications administered during this  visit: Angela Espinoza had no medications administered during this visit.   Pharmacotherapy under consideration:  Opioid Analgesics: The patient was informed that there is no guarantee that she would be a candidate for opioid analgesics. The decision will be made following CDC guidelines. This decision will be based on the results of diagnostic studies, as well as Angela Espinoza's risk profile.  Membrane stabilizer: To be determined at a later time Muscle relaxant: To be  determined at a later time NSAID: To be determined at a later time Other analgesic(s): To be determined at a later time   Interventional therapies under consideration: Angela Espinoza was informed that there is no guarantee that she would be a candidate for interventional therapies. The decision will be based on the results of diagnostic studies, as well as Angela Espinoza risk profile.  Possible procedure(s): Under consideration   Provider-requested follow-up: Return for 2nd Visit, w/ Dr. Dossie Arbour.  Future Appointments  Date Time Provider [place]  01/01/2018  9:30 AM Milinda Pointer, MD [facility] None    Primary Care Physician: Gennette Pac, FNP Location: [facility] Note by:  Date: 12/10/2017; Time: 4:28 PM  Pain Score Disclaimer: We use the NRS-11 scale. This is a self-reported, subjective measurement of pain severity with only modest accuracy. It is used primarily to identify changes within a particular patient. It must be understood that outpatient pain scales are significantly less accurate that those used for research, where they can be applied under ideal controlled circumstances with minimal exposure to variables. In reality, the score is likely to be a combination of pain intensity and pain affect, where pain affect describes the degree of emotional arousal or changes in action readiness caused by the sensory experience of pain. Factors such as social and work  situation, setting, emotional state, anxiety levels, expectation, and prior pain experience may influence pain perception and show large inter-individual differences that may also be affected by time variables.  Patient instructions provided during this appointment: Patient Instructions   __Follow up with labs and x-ray.  __________________________________________________________________________________________  Appointment Policy Summary  It is our goal and responsibility to provide the medical community with assistance in the evaluation and management of patients with chronic pain. Unfortunately our resources are limited. Because we do not have an unlimited amount of time, or available appointments, we are required to closely monitor and manage their use. The following rules exist to maximize their use:  Patient's responsibilities: 1. Punctuality:  At what time should I arrive? You should be physically present in our office 30 minutes before your scheduled appointment. Your scheduled appointment is with your assigned healthcare provider. However, it takes 5-10 minutes to be "checked-in", and another 15 minutes for the nurses to do the admission. If you arrive to our office at the time you were given for your appointment, you will end up being at least 20-25 minutes late to your appointment with the provider. 2. Tardiness:  What happens if I arrive only a few minutes after my scheduled appointment time? You will need to reschedule your appointment. The cutoff is your appointment time. This is why it is so important that you arrive at least 30 minutes before that appointment. If you have an appointment scheduled for 10:00 AM and you arrive at 10:01, you will be required to reschedule your appointment.  3. Plan ahead:  Always assume that you will encounter traffic on your way in. Plan for it. If you are dependent on a driver, make sure they understand these rules and the need to arrive  early. 4. Other appointments and responsibilities:  Avoid scheduling any other appointments before or after your pain clinic appointments.  5. Be prepared:  Write down everything that you need to discuss with your healthcare provider and give this information to the admitting nurse. Write down the medications that you will need refilled. Bring your pills and bottles (even the empty ones), to all of your appointments,  except for those where a procedure is scheduled. 6. No children or pets:  Find someone to take care of them. It is not appropriate to bring them in. 7. Scheduling changes:  We request "advanced notification" of any changes or cancellations. 8. Advanced notification:  Defined as a time period of more than 24 hours prior to the originally scheduled appointment. This allows for the appointment to be offered to other patients. 9. Rescheduling:  When a visit is rescheduled, it will require the cancellation of the original appointment. For this reason they both fall within the category of "Cancellations".  10. Cancellations:  They require advanced notification. Any cancellation less than 24 hours before the  appointment will be recorded as a "No Show". 11. No Show:  Defined as an unkept appointment where the patient failed to notify or declare to the practice their intention or inability to keep the appointment.  Corrective process for repeat offenders:  1. Tardiness: Three (3) episodes of rescheduling due to late arrivals will be recorded as one (1) "No Show". 2. Cancellation or reschedule: Three (3) cancellations or rescheduling will be recorded as one (1) "No Show". 3. "No Shows": Three (3) "No Shows" within a 12 month period will result in discharge from the practice. ____________________________________________________________________________________________   ______________________________________________________________________________________________  Specialty Pain  Scale  Introduction:  There are significant differences in how pain is reported. The word pain usually refers to physical pain, but it is also a common synonym of suffering. The medical community uses a scale from 0 (zero) to 10 (ten) to report pain level. Zero (0) is described as "no pain", while ten (10) is described as "the worse pain you can imagine". The problem with this scale is that physical pain is reported along with suffering. Suffering refers to mental pain, or more often yet it refers to any unpleasant feeling, emotion or aversion associated with the perception of harm or threat of harm. It is the psychological component of pain.  Pain Specialists prefer to separate the two components. The pain scale used by this practice is the Verbal Numerical Rating Scale (VNRS-11). This scale is for the physical pain only. DO NOT INCLUDE how your pain psychologically affects you. This scale is for adults 31 years of age and older. It has 11 (eleven) levels. The 1st level is 0/10. This means: "right now, I have no pain". In the context of pain management, it also means: "right now, my physical pain is under control with the current therapy".  General Information:  The scale should reflect your current level of pain. Unless you are specifically asked for the level of your worst pain, or your average pain. If you are asked for one of these two, then it should be understood that it is over the past 24 hours.  Levels 1 (one) through 5 (five) are described below, and can be treated as an outpatient. Ambulatory pain management facilities such as ours are more than adequate to treat these levels. Levels 6 (six) through 10 (ten) are also described below, however, these must be treated as a hospitalized patient. While levels 6 (six) and 7 (seven) may be evaluated at an urgent care facility, levels 8 (eight) through 10 (ten) constitute medical emergencies and as such, they belong in a hospital's emergency department.  When having these levels (as described below), do not come to our office. Our facility is not equipped to manage these levels. Go directly to an urgent care facility or an emergency department to be  evaluated.  Definitions:  Activities of Daily Living (ADL): Activities of daily living (ADL or ADLs) is a term used in healthcare to refer to people's daily self-care activities. Health professionals often use a person's ability or inability to perform ADLs as a measurement of their functional status, particularly in regard to people post injury, with disabilities and the elderly. There are two ADL levels: Basic and Instrumental. Basic Activities of Daily Living (BADL  or BADLs) consist of self-care tasks that include: Bathing and showering; personal hygiene and grooming (including brushing/combing/styling hair); dressing; Toilet hygiene (getting to the toilet, cleaning oneself, and getting back up); eating and self-feeding (not including cooking or chewing and swallowing); functional mobility, often referred to as "transferring", as measured by the ability to walk, get in and out of bed, and get into and out of a chair; the broader definition (moving from one place to another while performing activities) is useful for people with different physical abilities who are still able to get around independently. Basic ADLs include the things many people do when they get up in the morning and get ready to go out of the house: get out of bed, go to the toilet, bathe, dress, groom, and eat. On the average, loss of function typically follows a particular order. Hygiene is the first to go, followed by loss of toilet use and locomotion. The last to go is the ability to eat. When there is only one remaining area in which the person is independent, there is a 62.9% chance that it is eating and only a 3.5% chance that it is hygiene. Instrumental Activities of Daily Living (IADL or IADLs) are not necessary for fundamental  functioning, but they let an individual live independently in a community. IADL consist of tasks that include: cleaning and maintaining the house; home establishment and maintenance; care of others (including selecting and supervising caregivers); care of pets; child rearing; managing money; managing financials (investments, etc.); meal preparation and cleanup; shopping for groceries and necessities; moving within the community; safety procedures and emergency responses; health management and maintenance (taking prescribed medications); and using the telephone or other form of communication.  Instructions:  Most patients tend to report their pain as a combination of two factors, their physical pain and their psychosocial pain. This last one is also known as "suffering" and it is reflection of how physical pain affects you socially and psychologically. From now on, report them separately.  From this point on, when asked to report your pain level, report only your physical pain. Use the following table for reference.  Pain Clinic Pain Levels (0-5/10)  Pain Level Score  Description  No Pain 0   Mild pain 1 Nagging, annoying, but does not interfere with basic activities of daily living (ADL). Patients are able to eat, bathe, get dressed, toileting (being able to get on and off the toilet and perform personal hygiene functions), transfer (move in and out of bed or a chair without assistance), and maintain continence (able to control bladder and bowel functions). Blood pressure and heart rate are unaffected. A normal heart rate for a healthy adult ranges from 60 to 100 bpm (beats per minute).   Mild to moderate pain 2 Noticeable and distracting. Impossible to hide from other people. More frequent flare-ups. Still possible to adapt and function close to normal. It can be very annoying and may have occasional stronger flare-ups. With discipline, patients may get used to it and adapt.   Moderate pain 3  Interferes significantly  with activities of daily living (ADL). It becomes difficult to feed, bathe, get dressed, get on and off the toilet or to perform personal hygiene functions. Difficult to get in and out of bed or a chair without assistance. Very distracting. With effort, it can be ignored when deeply involved in activities.   Moderately severe pain 4 Impossible to ignore for more than a few minutes. With effort, patients may still be able to manage work or participate in some social activities. Very difficult to concentrate. Signs of autonomic nervous system discharge are evident: dilated pupils (mydriasis); mild sweating (diaphoresis); sleep interference. Heart rate becomes elevated (>115 bpm). Diastolic blood pressure (lower number) rises above 100 mmHg. Patients find relief in laying down and not moving.   Severe pain 5 Intense and extremely unpleasant. Associated with frowning face and frequent crying. Pain overwhelms the senses.  Ability to do any activity or maintain social relationships becomes significantly limited. Conversation becomes difficult. Pacing back and forth is common, as getting into a comfortable position is nearly impossible. Pain wakes you up from deep sleep. Physical signs will be obvious: pupillary dilation; increased sweating; goosebumps; brisk reflexes; cold, clammy hands and feet; nausea, vomiting or dry heaves; loss of appetite; significant sleep disturbance with inability to fall asleep or to remain asleep. When persistent, significant weight loss is observed due to the complete loss of appetite and sleep deprivation.  Blood pressure and heart rate becomes significantly elevated. Caution: If elevated blood pressure triggers a pounding headache associated with blurred vision, then the patient should immediately seek attention at an urgent or emergency care unit, as these may be signs of an impending stroke.    Emergency Department Pain Levels (6-10/10)  Emergency Room  Pain 6 Severely limiting. Requires emergency care and should not be seen or managed at an outpatient pain management facility. Communication becomes difficult and requires great effort. Assistance to reach the emergency department may be required. Facial flushing and profuse sweating along with potentially dangerous increases in heart rate and blood pressure will be evident.   Distressing pain 7 Self-care is very difficult. Assistance is required to transport, or use restroom. Assistance to reach the emergency department will be required. Tasks requiring coordination, such as bathing and getting dressed become very difficult.   Disabling pain 8 Self-care is no longer possible. At this level, pain is disabling. The individual is unable to do even the most "basic" activities such as walking, eating, bathing, dressing, transferring to a bed, or toileting. Fine motor skills are lost. It is difficult to think clearly.   Incapacitating pain 9 Pain becomes incapacitating. Thought processing is no longer possible. Difficult to remember your own name. Control of movement and coordination are lost.   The worst pain imaginable 10 At this level, most patients pass out from pain. When this level is reached, collapse of the autonomic nervous system occurs, leading to a sudden drop in blood pressure and heart rate. This in turn results in a temporary and dramatic drop in blood flow to the brain, leading to a loss of consciousness. Fainting is one of the body's self defense mechanisms. Passing out puts the brain in a calmed state and causes it to shut down for a while, in order to begin the healing process.    Summary: 1. Refer to this scale when providing [place] with your pain level. 2. Be accurate and careful when reporting your pain level. This will help with your care. 3. Over-reporting your pain level will lead  to loss of credibility. 4. Even a level of 1/10 means that there is pain and will be treated at our  facility. 5. High, inaccurate reporting will be documented as "Symptom Exaggeration", leading to loss of credibility and suspicions of possible secondary gains such as obtaining more narcotics, or wanting to appear disabled, for fraudulent reasons. 6. Only pain levels of 5 or below will be seen at our facility. 7. Pain levels of 6 and above will be sent to the Emergency Department and the appointment cancelled. ______________________________________________________________________________________________

## 2017-12-10 NOTE — Progress Notes (Signed)
Safety precautions to be maintained throughout the outpatient stay will include: orient to surroundings, keep bed in low position, maintain call bell within reach at all times, provide assistance with transfer out of bed and ambulation.  

## 2017-12-11 NOTE — Progress Notes (Signed)
Results were reviewed and found to be: mildly abnormal  No acute injury or pathology identified  Review would suggest interventional pain management techniques may be of benefit 

## 2017-12-14 LAB — 25-HYDROXY VITAMIN D LCMS D2+D3
25-Hydroxy, Vitamin D-2: <1 ng/mL
25-Hydroxy, Vitamin D-3: 39 ng/mL
25-Hydroxy, Vitamin D: 39 ng/mL

## 2017-12-14 LAB — VITAMIN B12: Vitamin B-12: 1807 pg/mL — ABNORMAL HIGH (ref 232–1245)

## 2017-12-14 LAB — COMPLIANCE DRUG ANALYSIS, UR

## 2017-12-14 LAB — MAGNESIUM: MAGNESIUM: 2.3 mg/dL (ref 1.6–2.3)

## 2017-12-14 LAB — C-REACTIVE PROTEIN: CRP: 1 mg/L (ref 0–10)

## 2017-12-14 LAB — SEDIMENTATION RATE: Sed Rate: 3 mm/h (ref 0–40)

## 2018-01-01 ENCOUNTER — Encounter: Payer: Self-pay | Admitting: Pain Medicine

## 2018-01-01 ENCOUNTER — Other Ambulatory Visit: Payer: Self-pay

## 2018-01-01 ENCOUNTER — Ambulatory Visit: Payer: Medicare Other | Attending: Pain Medicine | Admitting: Pain Medicine

## 2018-01-01 VITALS — BP 102/69 | HR 98 | Temp 97.9°F | Ht 62.0 in | Wt 145.0 lb

## 2018-01-01 DIAGNOSIS — M4856XA Collapsed vertebra, not elsewhere classified, lumbar region, initial encounter for fracture: Secondary | ICD-10-CM | POA: Diagnosis not present

## 2018-01-01 DIAGNOSIS — F419 Anxiety disorder, unspecified: Secondary | ICD-10-CM | POA: Insufficient documentation

## 2018-01-01 DIAGNOSIS — M549 Dorsalgia, unspecified: Secondary | ICD-10-CM | POA: Insufficient documentation

## 2018-01-01 DIAGNOSIS — F329 Major depressive disorder, single episode, unspecified: Secondary | ICD-10-CM | POA: Insufficient documentation

## 2018-01-01 DIAGNOSIS — I10 Essential (primary) hypertension: Secondary | ICD-10-CM | POA: Insufficient documentation

## 2018-01-01 DIAGNOSIS — M545 Low back pain: Secondary | ICD-10-CM | POA: Diagnosis not present

## 2018-01-01 DIAGNOSIS — J45909 Unspecified asthma, uncomplicated: Secondary | ICD-10-CM | POA: Diagnosis not present

## 2018-01-01 DIAGNOSIS — G8929 Other chronic pain: Secondary | ICD-10-CM

## 2018-01-01 DIAGNOSIS — Z79899 Other long term (current) drug therapy: Secondary | ICD-10-CM | POA: Diagnosis not present

## 2018-01-01 DIAGNOSIS — S32010S Wedge compression fracture of first lumbar vertebra, sequela: Secondary | ICD-10-CM

## 2018-01-01 DIAGNOSIS — K589 Irritable bowel syndrome without diarrhea: Secondary | ICD-10-CM | POA: Insufficient documentation

## 2018-01-01 DIAGNOSIS — G894 Chronic pain syndrome: Secondary | ICD-10-CM

## 2018-01-01 DIAGNOSIS — Z9889 Other specified postprocedural states: Secondary | ICD-10-CM | POA: Diagnosis not present

## 2018-01-01 DIAGNOSIS — M47816 Spondylosis without myelopathy or radiculopathy, lumbar region: Secondary | ICD-10-CM | POA: Diagnosis not present

## 2018-01-01 DIAGNOSIS — E785 Hyperlipidemia, unspecified: Secondary | ICD-10-CM | POA: Insufficient documentation

## 2018-01-01 DIAGNOSIS — M899 Disorder of bone, unspecified: Secondary | ICD-10-CM

## 2018-01-01 NOTE — Patient Instructions (Signed)
____________________________________________________________________________________________  Preparing for Procedure with Sedation  Instructions: . Oral Intake: Do not eat or drink anything for at least 8 hours prior to your procedure. . Transportation: Public transportation is not allowed. Bring an adult driver. The driver must be physically present in our waiting room before any procedure can be started. . Physical Assistance: Bring an adult physically capable of assisting you, in the event you need help. This adult should keep you company at home for at least 6 hours after the procedure. . Blood Pressure Medicine: Take your blood pressure medicine with a sip of water the morning of the procedure. . Blood thinners: Notify our staff if you are taking any blood thinners. Depending on which one you take, there will be specific instructions on how and when to stop it. . Diabetics on insulin: Notify the staff so that you can be scheduled 1st case in the morning. If your diabetes requires high dose insulin, take only  of your normal insulin dose the morning of the procedure and notify the staff that you have done so. . Preventing infections: Shower with an antibacterial soap the morning of your procedure. . Build-up your immune system: Take 1000 mg of Vitamin C with every meal (3 times a day) the day prior to your procedure. . Antibiotics: Inform the staff if you have a condition or reason that requires you to take antibiotics before dental procedures. . Pregnancy: If you are pregnant, call and cancel the procedure. . Sickness: If you have a cold, fever, or any active infections, call and cancel the procedure. . Arrival: You must be in the facility at least 30 minutes prior to your scheduled procedure. . Children: Do not bring children with you. . Dress appropriately: Bring dark clothing that you would not mind if they get stained. . Valuables: Do not bring any jewelry or valuables.  Procedure  appointments are reserved for interventional treatments only. . No Prescription Refills. . No medication changes will be discussed during procedure appointments. . No disability issues will be discussed.  Reasons to call and reschedule or cancel your procedure: (Following these recommendations will minimize the risk of a serious complication.) . Surgeries: Avoid having procedures within 2 weeks of any surgery. (Avoid for 2 weeks before or after any surgery). . Flu Shots: Avoid having procedures within 2 weeks of a flu shots or . (Avoid for 2 weeks before or after immunizations). . Barium: Avoid having a procedure within 7-10 days after having had a radiological study involving the use of radiological contrast. (Myelograms, Barium swallow or enema study). . Heart attacks: Avoid any elective procedures or surgeries for the initial 6 months after a "Myocardial Infarction" (Heart Attack). . Blood thinners: It is imperative that you stop these medications before procedures. Let us know if you if you take any blood thinner.  . Infection: Avoid procedures during or within two weeks of an infection (including chest colds or gastrointestinal problems). Symptoms associated with infections include: Localized redness, fever, chills, night sweats or profuse sweating, burning sensation when voiding, cough, congestion, stuffiness, runny nose, sore throat, diarrhea, nausea, vomiting, cold or Flu symptoms, recent or current infections. It is specially important if the infection is over the area that we intend to treat. . Heart and lung problems: Symptoms that may suggest an active cardiopulmonary problem include: cough, chest pain, breathing difficulties or shortness of breath, dizziness, ankle swelling, uncontrolled high or unusually low blood pressure, and/or palpitations. If you are experiencing any of these symptoms, cancel   your procedure and contact your primary care physician for an evaluation.  Remember:   Regular Business hours are:  Monday to Thursday 8:00 AM to 4:00 PM  Provider's Schedule: Alexzandria Massman, MD:  Procedure days: Tuesday and Thursday 7:30 AM to 4:00 PM  Bilal Lateef, MD:  Procedure days: Monday and Wednesday 7:30 AM to 4:00 PM ____________________________________________________________________________________________    

## 2018-01-01 NOTE — Progress Notes (Signed)
Patient's Name: Angela Espinoza  MRN: [id number]  Referring Provider: Gennette Pac, FNP  DOB: 11-Apr-1955  PCP: Gennette Pac, FNP  DOS: 01/01/2018  Note by: Gaspar Cola, MD  Service setting: Ambulatory outpatient  Specialty: Interventional Pain Management  Location: [facility] (AMB) Pain Management Facility    Patient type: Established   Primary Reason(s) for Visit: Encounter for evaluation before starting new chronic pain management plan of care (Level of risk: moderate) CC: Back Pain  HPI  Angela Espinoza is a 62 y.o. year old, female patient, who comes today for a follow-up evaluation to review the test results and decide on a treatment plan. She has Allergy; Anxiety; Asthma without status asthmaticus; Depression; Hypertension; Postmenopause; Chronic low back pain (Primary Area of Pain) (Left) w/o sciatica; Chronic pain syndrome; Pharmacologic therapy; Disorder of skeletal system; Problems influencing health status; History of L1 kyphoplasty; Traumatic compression fracture of L1 lumbar vertebra, sequela; Spondylosis without myelopathy or radiculopathy, lumbar region; and Lumbar facet joint syndrome (Left) on their problem list. Her primarily concern today is the Back Pain  Pain Assessment: Location: Mid Back Radiating: Denies Onset: More than a month ago Duration: Chronic pain Quality: Stabbing, Sharp Severity: 1 /10 (subjective, self-reported pain score)  Note: Reported level is compatible with observation.                         When using our objective Pain Scale, levels between 6 and 10/10 are said to belong in an emergency room, as it progressively worsens from a 6/10, described as severely limiting, requiring emergency care not usually available at an outpatient pain management facility. At a 6/10 level, communication becomes difficult and requires great effort. Assistance to reach the emergency department may be required. Facial flushing and profuse sweating along with potentially  dangerous increases in heart rate and blood pressure will be evident. Effect on ADL: unable to do some households Timing: Constant Modifying factors: cold pack, hot packs, medications, resting and physical therapy BP: 102/69  HR: 98  Angela Espinoza comes in today for a follow-up visit after her initial evaluation on 12/10/2017. Today we went over the results of her tests. These were explained in "Layman's terms". During today's appointment we went over my diagnostic impression, as well as the proposed treatment plan.  According to the patient her primary area pain is in her lower back (L).  She admits that she suffered a fracture to L1 after running her lawn mower often an embankment.  She is s/p kyphoplasty.  She admits that she is a patient of [facility] and has had nerve blocks and a lumbar facet radiofrequency (Radiofrequency Ablation of medial branches - LT L4 & L5) (October 14, 2017) Colbert Coyer, Montez Morita, MD). She admits that she did have 3 months of physical therapy.  She is here for medication management. Use to work for the [employer]. Goes to the [facility] for pool therapy. Use to take gabapentin and meloxicam, but this last one did not help much. Sister, Angela Espinoza works at the [employer] in [place].   In considering the treatment plan options, Ms. Bocchino was reminded that I no longer take patients for medication management only. I asked her to let me know if she had no intention of taking advantage of the interventional therapies, so that we could make arrangements to provide this space to someone interested. I also made it clear that undergoing interventional therapies for the purpose of getting pain medications is very  inappropriate on the part of a patient, and it will not be tolerated in this practice. This type of behavior would suggest true addiction and therefore it requires referral to an addiction specialist.   Further details on both, my assessment(s), as well as the proposed treatment  plan, please see below.  Controlled Substance Pharmacotherapy Assessment REMS (Risk Evaluation and Mitigation Strategy)  Analgesic: None Highest recorded MME/day: 40 mg/day MME/day: 0 mg/day Pill Count: None expected due to no prior prescriptions written by our practice. No notes on file Pharmacokinetics: Liberation and absorption (onset of action): WNL Distribution (time to peak effect): WNL Metabolism and excretion (duration of action): WNL         Pharmacodynamics: Desired effects: Analgesia: Ms. Menser reports >50% benefit. Functional ability: Patient reports that medication allows her to accomplish basic ADLs Clinically meaningful improvement in function (CMIF): Sustained CMIF goals met Perceived effectiveness: Described as relatively effective, allowing for increase in activities of daily living (ADL) Undesirable effects: Side-effects or Adverse reactions: None reported Monitoring: [place] PMP: Online review of the past 22-monthperiod previously conducted. Not applicable at this point since we have not taken over the patient's medication management yet. List of other Serum/Urine Drug Screening Test(s):  No results found. List of all UDS test(s) done:  Lab Results  Component Value Date   SUMMARY FINAL 12/10/2017   Last UDS on record: Summary  Date Value Ref Range Status  12/10/2017 FINAL  Final    Comment:    ==================================================================== TOXASSURE COMP DRUG ANALYSIS,UR ==================================================================== Test                             Result       Flag       Units Drug Present and Declared for Prescription Verification   Cyclobenzaprine                PRESENT      EXPECTED   Amitriptyline                  PRESENT      EXPECTED   Metoprolol                     PRESENT      EXPECTED Drug Absent but Declared for Prescription Verification   Desmethylcyclobenzaprine       Not Detected UNEXPECTED    Duloxetine                     Not Detected UNEXPECTED ==================================================================== Test                      Result    Flag   Units      Ref Range   Creatinine              28               mg/dL      >=20 ==================================================================== Declared Medications:  The flagging and interpretation on this report are based on the  following declared medications.  Unexpected results may arise from  inaccuracies in the declared medications.  **Note: The testing scope of this panel includes these medications:  Amitriptyline  Cyclobenzaprine  Duloxetine  Metoprolol  **Note: The testing scope of this panel does not include following  reported medications:  Cholecalciferol  Hydrochlorothiazide  Levocetirizine  Losartan (Losartan Potassium)  Magnesium  Meloxicam  Multivitamin  Supplement  Supplement (Omega-3)  Vitamin B (Biotin)  Vitamin C ==================================================================== For clinical consultation, please call [phone]. ====================================================================    UDS interpretation: No unexpected findings.          Medication Assessment Form: Patient introduced to form today Treatment compliance: Treatment may start today if patient agrees with proposed plan. Evaluation of compliance is not applicable at this point Risk Assessment Profile: Aberrant behavior: See initial evaluations. None observed or detected today Comorbid factors increasing risk of overdose: See initial evaluation. No additional risks detected today Opioid risk tool (ORT) (Total Score): 1 Personal History of Substance Abuse (SUD-Substance use disorder):  Alcohol: Negative  Illegal Drugs: Negative  Rx Drugs: Negative  ORT Risk Level calculation: Low Risk Risk of substance use disorder (SUD): Low Opioid Risk Tool - 01/01/18 0927      Family History of Substance Abuse    Alcohol  Negative    Illegal Drugs  Negative    Rx Drugs  Negative      Personal History of Substance Abuse   Alcohol  Negative    Illegal Drugs  Negative    Rx Drugs  Negative      Age   Age between 57-45 years   No      Psychological Disease   Psychological Disease  Negative    Depression  Positive      Total Score   Opioid Risk Tool Scoring  1    Opioid Risk Interpretation  Low Risk      ORT Scoring interpretation table:  Score <3 = Low Risk for SUD  Score between 4-7 = Moderate Risk for SUD  Score >8 = High Risk for Opioid Abuse   Risk Mitigation Strategies:  Patient opioid safety counseling: Not applicable. Patient-Prescriber Agreement (PPA): No agreement signed.  Controlled substance notification to other providers: None required. No opioid therapy.  Pharmacologic Plan: No opioid analgesic prescribed. Ms. Adami has declined the medication plan offered.  Laboratory Chemistry  Inflammation Markers (CRP: Acute Phase) (ESR: Chronic Phase) Lab Results  Component Value Date   CRP 1 12/10/2017   ESRSEDRATE 3 12/10/2017                         Rheumatology Markers No results found.  Renal Function Markers Lab Results  Component Value Date   BUN 5 (L) 08/12/2011   CREATININE 0.66 08/12/2011   GFRAA >60 08/12/2011   GFRNONAA >60 08/12/2011                             Hepatic Function Markers Lab Results  Component Value Date   AST 51 (H) 08/10/2011   ALT 42 08/10/2011   ALBUMIN 4.2 08/10/2011   ALKPHOS 88 08/10/2011                        Electrolytes Lab Results  Component Value Date   NA 138 08/12/2011   K 4.4 08/12/2011   CL 104 08/12/2011   CALCIUM 8.5 08/12/2011   MG 2.3 12/10/2017   PHOS 1.6 (L) 08/10/2011                        Neuropathy Markers Lab Results  Component Value Date   VITAMINB12 1,807 (H) 12/10/2017  CNS Tests No results found.  Bone Pathology Markers Lab Results  Component Value Date    25OHVITD1 40 01/01/2018   25OHVITD2 <1.0 01/01/2018   25OHVITD3 40 01/01/2018                         Coagulation Parameters Lab Results  Component Value Date   PLT 255 08/12/2011                        Cardiovascular Markers Lab Results  Component Value Date   HGB 12.4 08/12/2011   HCT 35.0 08/12/2011                         CA Markers No results found.  Note: Lab results reviewed.  Recent Diagnostic Imaging Review  Lumbosacral Imaging: Lumbar MR wo contrast:  Results for orders placed during the hospital encounter of 12/03/14  MR Lumbar Spine Wo Contrast   Narrative CLINICAL DATA:  Low back pain since the patient had a riding lawnmower accident on 09/10/2014.  EXAM: MRI LUMBAR SPINE WITHOUT CONTRAST  TECHNIQUE: Multiplanar, multisequence MR imaging of the lumbar spine was performed. No intravenous contrast was administered.  COMPARISON:  None.  FINDINGS: Normal conus tip at L2.  Normal paraspinal soft tissues.  T10-11 and T11-12: Negative.  T12-L1: Benign non acute compression fracture of the superior aspect of L1. There is 5 mm of protrusion of the posterior superior aspect of the L1 vertebral body into the spinal canal without neural impingement. No abnormal signal from the adjacent spinal cord. No disc protrusion.  L1-2 through L5-S1: Normal.  IMPRESSION: Old benign-appearing compression fracture of the superior aspect of L1 with slight bone protrusion into the spinal canal without neural impingement. There is minimal edema in the superior endplate of L1 consistent with the history of a fracture and August 2016.  Otherwise, normal exam.   Electronically Signed   By: Lorriane Shire M.D.   On: 12/03/2014 14:18    Lumbar DG 2-3 views:  Results for orders placed during the hospital encounter of 12/10/14  DG Lumbar Spine 2-3 Views   Narrative CLINICAL DATA:  L1 compression deformity  EXAM: LUMBAR SPINE - 2-3 VIEW; DG C-ARM 61-120  MIN  COMPARISON:  12/03/2014  FLUOROSCOPY TIME:  Radiation Exposure Index (as provided by the fluoroscopic device): Not available  If the device does not provide the exposure index:  Fluoroscopy Time:  2 minutes  Number of Acquired Images:  2  FINDINGS: Contrast laden cement is noted throughout the L1 vertebral body consistent with the recent kyphoplasty procedure. No significant extravasation is noted.  IMPRESSION: Status post L1 kyphoplasty   Electronically Signed   By: Inez Catalina M.D.   On: 12/10/2014 14:02    Lumbar DG Bending views:  Results for orders placed during the hospital encounter of 12/10/17  DG Lumbar Spine Complete W/Bend   Narrative CLINICAL DATA:  Chronic low back pain for 3 years since falling off of a lawnmower. Prior L1 kyphoplasty.  EXAM: LUMBAR SPINE - COMPLETE WITH BENDING VIEWS  COMPARISON:  Lumbar spine MRI 12/03/2014. Intraoperative images during L1 kyphoplasty 12/10/2014.  FINDINGS: There are 5 non rib-bearing lumbar type vertebrae. A chronic L1 compression fracture is again noted status post kyphoplasty. No new fracture is identified. Vertebral alignment is normal without evidence of dynamic instability on flexion and extension images. Mild degenerative endplate spurring is present at T12-L1. There  is mild posterior disc space narrowing at L1-2 with minimal degenerative endplate changes. Disc space heights are preserved from L2-3 to L5-S1. There is mild-to-moderate gaseous distension of the colon.  IMPRESSION: 1. Remote L1 compression fracture status post kyphoplasty. No acute osseous abnormality. 2. Minimal degenerative changes.   Electronically Signed   By: Logan Bores M.D.   On: 12/11/2017 08:29    Complexity Note: Imaging results reviewed. Results shared with Ms. Redditt, using Layman's terms.                         Meds   Current Outpatient Medications:  .  amitriptyline (ELAVIL) 25 MG tablet, Take 25 mg by  mouth at bedtime., Disp: , Rfl:  .  Biotin 1000 MCG CHEW, Chew by mouth., Disp: , Rfl:  .  Black Pepper-Turmeric (TURMERIC COMPLEX/BLACK PEPPER) 3-500 MG CAPS, Take by mouth., Disp: , Rfl:  .  Cholecalciferol (D3-1000) 25 MCG (1000 UT) tablet, Take 1,000 Units by mouth daily., Disp: , Rfl:  .  cyclobenzaprine (FLEXERIL) 5 MG tablet, Take by mouth 2 (two) times daily. , Disp: , Rfl:  .  DULoxetine (CYMBALTA) 30 MG capsule, Take 30 mg by mouth 2 (two) times daily., Disp: , Rfl:  .  gabapentin (NEURONTIN) 100 MG capsule, Take 100 mg by mouth 3 (three) times daily., Disp: , Rfl:  .  hydrochlorothiazide (MICROZIDE) 12.5 MG capsule, Take 12.5 mg by mouth daily., Disp: , Rfl:  .  levocetirizine (XYZAL) 5 MG tablet, Take by mouth., Disp: , Rfl:  .  lidocaine (LIDODERM) 5 %, Place 1 patch onto the skin daily. Remove & Discard patch within 12 hours or as directed by MD, Disp: , Rfl:  .  losartan (COZAAR) 25 MG tablet, Take 25 mg by mouth every morning., Disp: , Rfl:  .  Magnesium 250 MG TABS, Take by mouth., Disp: , Rfl:  .  metoprolol succinate (TOPROL-XL) 25 MG 24 hr tablet, Take 25 mg by mouth every morning., Disp: , Rfl:  .  Multiple Vitamin (MULTIVITAMIN) capsule, Take 1 capsule by mouth daily., Disp: , Rfl:  .  Omega-3 Fatty Acids (FISH OIL PO), Take 1 tablet by mouth daily., Disp: , Rfl:  .  vitamin C (ASCORBIC ACID) 500 MG tablet, Take 1,000 mg by mouth daily. , Disp: , Rfl:   ROS  Constitutional: Denies any fever or chills Gastrointestinal: No reported hemesis, hematochezia, vomiting, or acute GI distress Musculoskeletal: Denies any acute onset joint swelling, redness, loss of ROM, or weakness Neurological: No reported episodes of acute onset apraxia, aphasia, dysarthria, agnosia, amnesia, paralysis, loss of coordination, or loss of consciousness  Allergies  Ms. Smoots is allergic to indapamide.  PFSH  Drug: Ms. Nicholls  reports that she does not use drugs. Alcohol:  reports that she  drinks alcohol. Tobacco:  reports that she is a non-smoker but has been exposed to tobacco smoke. She has never used smokeless tobacco. Medical:  has a past medical history of Anxiety, Asthma, Depression, Hyperlipidemia, Hypertension, and IBS (irritable bowel syndrome). Surgical: Ms. Bessey  has a past surgical history that includes Oophorectomy; Exploratory laparotomy; Foot surgery; Abdominal hysterectomy; Colonoscopy; Tonsillectomy; Axillary lymph node biopsy (Right, 08/03/2014); and Kyphoplasty (N/A, 12/10/2014). Family: family history is not on file.  Constitutional Exam  General appearance: Well nourished, well developed, and well hydrated. In no apparent acute distress Vitals:   01/01/18 0911  BP: 102/69  Pulse: 98  Temp: 97.9 F (36.6 C)  SpO2:  100%  Weight: 145 lb (65.8 kg)  Height: 5' 2" (1.575 m)   BMI Assessment: Estimated body mass index is 26.52 kg/m as calculated from the following:   Height as of this encounter: 5' 2" (1.575 m).   Weight as of this encounter: 145 lb (65.8 kg).  BMI interpretation table: BMI level Category Range association with higher incidence of chronic pain  <18 kg/m2 Underweight   18.5-24.9 kg/m2 Ideal body weight   25-29.9 kg/m2 Overweight Increased incidence by 20%  30-34.9 kg/m2 Obese (Class I) Increased incidence by 68%  35-39.9 kg/m2 Severe obesity (Class II) Increased incidence by 136%  >40 kg/m2 Extreme obesity (Class III) Increased incidence by 254%   Patient's current BMI Ideal Body weight  Body mass index is 26.52 kg/m. Ideal body weight: 50.1 kg (110 lb 7.2 oz) Adjusted ideal body weight: 56.4 kg (124 lb 4.3 oz)   BMI Readings from Last 4 Encounters:  01/01/18 26.52 kg/m  12/10/17 26.52 kg/m  09/07/15 26.34 kg/m  12/10/14 27.44 kg/m   Wt Readings from Last 4 Encounters:  01/01/18 145 lb (65.8 kg)  12/10/17 145 lb (65.8 kg)  09/07/15 146 lb 13.2 oz (66.6 kg)  12/10/14 150 lb (68 kg)  Psych/Mental status: Alert,  oriented x 3 (person, place, & time)       Eyes: PERLA Respiratory: No evidence of acute respiratory distress  Cervical Spine Area Exam  Skin & Axial Inspection: No masses, redness, edema, swelling, or associated skin lesions Alignment: Symmetrical Functional ROM: Unrestricted ROM      Stability: No instability detected Muscle Tone/Strength: Functionally intact. No obvious neuro-muscular anomalies detected. Sensory (Neurological): Unimpaired Palpation: No palpable anomalies              Upper Extremity (UE) Exam    Side: Right upper extremity  Side: Left upper extremity  Skin & Extremity Inspection: Skin color, temperature, and hair growth are WNL. No peripheral edema or cyanosis. No masses, redness, swelling, asymmetry, or associated skin lesions. No contractures.  Skin & Extremity Inspection: Skin color, temperature, and hair growth are WNL. No peripheral edema or cyanosis. No masses, redness, swelling, asymmetry, or associated skin lesions. No contractures.  Functional ROM: Unrestricted ROM          Functional ROM: Unrestricted ROM          Muscle Tone/Strength: Functionally intact. No obvious neuro-muscular anomalies detected.  Muscle Tone/Strength: Functionally intact. No obvious neuro-muscular anomalies detected.  Sensory (Neurological): Unimpaired          Sensory (Neurological): Unimpaired          Palpation: No palpable anomalies              Palpation: No palpable anomalies              Provocative Test(s):  Phalen's test: deferred Tinel's test: deferred Apley's scratch test (touch opposite shoulder):  Action 1 (Across chest): deferred Action 2 (Overhead): deferred Action 3 (LB reach): deferred   Provocative Test(s):  Phalen's test: deferred Tinel's test: deferred Apley's scratch test (touch opposite shoulder):  Action 1 (Across chest): deferred Action 2 (Overhead): deferred Action 3 (LB reach): deferred    Thoracic Spine Area Exam  Skin & Axial Inspection: No masses,  redness, or swelling Alignment: Symmetrical Functional ROM: Decreased ROM Stability: No instability detected Muscle Tone/Strength: Functionally intact. No obvious neuro-muscular anomalies detected. Sensory (Neurological): Unimpaired Muscle strength & Tone: Complains of area being tender to palpation  Lumbar Spine Area Exam  Skin &  Axial Inspection: No masses, redness, or swelling Alignment: Symmetrical Functional ROM: Decreased ROM affecting both sides Stability: No instability detected Muscle Tone/Strength: Functionally intact. No obvious neuro-muscular anomalies detected. Sensory (Neurological): Movement-associated pain Palpation: Complains of area being tender to palpation       Provocative Tests: Hyperextension/rotation test: (+) bilaterally for facet joint pain.  (L>R) Lumbar quadrant test (Kemp's test): (+) bilaterally for facet joint pain.  (L>R) Lateral bending test: deferred today       Patrick's Maneuver: deferred today                   FABER test: deferred today                   S-I anterior distraction/compression test: deferred today         S-I lateral compression test: deferred today         S-I Thigh-thrust test: deferred today         S-I Gaenslen's test: deferred today          Gait & Posture Assessment  Ambulation: Unassisted Gait: Relatively normal for age and body habitus Posture: Antalgic   Lower Extremity Exam    Side: Right lower extremity  Side: Left lower extremity  Stability: No instability observed          Stability: No instability observed          Skin & Extremity Inspection: Skin color, temperature, and hair growth are WNL. No peripheral edema or cyanosis. No masses, redness, swelling, asymmetry, or associated skin lesions. No contractures.  Skin & Extremity Inspection: Skin color, temperature, and hair growth are WNL. No peripheral edema or cyanosis. No masses, redness, swelling, asymmetry, or associated skin lesions. No contractures.  Functional  ROM: Unrestricted ROM                  Functional ROM: Unrestricted ROM                  Muscle Tone/Strength: Functionally intact. No obvious neuro-muscular anomalies detected.  Muscle Tone/Strength: Functionally intact. No obvious neuro-muscular anomalies detected.  Sensory (Neurological): Unimpaired        Sensory (Neurological): Unimpaired        DTR: Patellar: deferred today Achilles: deferred today Plantar: deferred today  DTR: Patellar: deferred today Achilles: deferred today Plantar: deferred today  Palpation: No palpable anomalies  Palpation: No palpable anomalies   Assessment & Plan  Primary Diagnosis & Pertinent Problem List: The primary encounter diagnosis was Chronic pain syndrome. Diagnoses of Chronic low back pain (Primary Area of Pain) (Left) w/o sciatica, Spondylosis without myelopathy or radiculopathy, lumbar region, Lumbar facet joint syndrome (Left), History of L1 kyphoplasty, Traumatic compression fracture of L1 lumbar vertebra, sequela, Pharmacologic therapy, and Disorder of skeletal system were also pertinent to this visit.  Visit Diagnosis: 1. Chronic pain syndrome   2. Chronic low back pain (Primary Area of Pain) (Left) w/o sciatica   3. Spondylosis without myelopathy or radiculopathy, lumbar region   4. Lumbar facet joint syndrome (Left)   5. History of L1 kyphoplasty   6. Traumatic compression fracture of L1 lumbar vertebra, sequela   7. Pharmacologic therapy   8. Disorder of skeletal system    Problems updated and reviewed during this visit: No problems updated.  Plan of Care  Pharmacotherapy (Medications Ordered): No orders of the defined types were placed in this encounter.  Procedure Orders     LUMBAR FACET(MEDIAL BRANCH NERVE BLOCK) MBNB  Lab Orders     Comp. Metabolic Panel (12)     [id number] D Lcms D2+D3 Imaging Orders  No imaging studies ordered today   Referral Orders  No referral(s) requested today   Pharmacological  management options:  Opioid Analgesics: I will not be prescribing any opioids at this time Membrane stabilizer: We have discussed the possibility of optimizing this mode of therapy, if tolerated Muscle relaxant: We have discussed the possibility of a trial NSAID: We have discussed the possibility of a trial.  I recommended that she go back on the meloxicam. Other analgesic(s): To be determined at a later time   Interventional management options: Planned, scheduled, and/or pending:    Diagnostic left-sided lumbar facet blocks of T12-L3 #1, under fluoroscopic guidance and IV sedation. I directly asked the patient if she wanted me to take over her medications and she indicated that she will like to keep meds with current physician. I recommended going back on meloxicam, in addition to the gabapentin.   Note: Oddly, despite the fact that we had covered the issue about the medications and that I had asked her directly whether or not she wanted me to take over the medications, to which she responded no, after I left the room she sent the nurse to ask me if I was going to write for her medications. (?????) I said no since we have already covered the topic and she had declined having me write for them.  Considering:   Diagnostic left-sided thoracolumbar facet blocks from T12-L3 #1  Diagnostic left-sided L1-2 LESI #1    PRN Procedures:   None at this time   Provider-requested follow-up: Return for Procedure (w/ sedation): (L) L-FCT #1 (T12-L3).  Future Appointments  Date Time Provider [place]  01/16/2018  9:30 AM Milinda Pointer, MD [facility] None    Primary Care Physician: Gennette Pac, FNP Location: [facility] Facility Note by: Gaspar Cola, MD Date: 01/01/2018; Time: 10:53 AM

## 2018-01-05 LAB — COMP. METABOLIC PANEL (12)
A/G RATIO: 2 (ref 1.2–2.2)
ALK PHOS: 73 IU/L (ref 39–117)
AST: 26 IU/L (ref 0–40)
Albumin: 4.6 g/dL (ref 3.6–4.8)
BILIRUBIN TOTAL: 0.2 mg/dL (ref 0.0–1.2)
BUN / CREAT RATIO: 11 — AB (ref 12–28)
BUN: 10 mg/dL (ref 8–27)
CHLORIDE: 98 mmol/L (ref 96–106)
Calcium: 9.9 mg/dL (ref 8.7–10.3)
Creatinine, Ser: 0.88 mg/dL (ref 0.57–1.00)
GFR calc Af Amer: 81 mL/min/{1.73_m2} (ref >59)
GFR calc non Af Amer: 70 mL/min/{1.73_m2} (ref >59)
Globulin, Total: 2.3 g/dL (ref 1.5–4.5)
Glucose: 92 mg/dL (ref 65–99)
Potassium: 4.5 mmol/L (ref 3.5–5.2)
Sodium: 136 mmol/L (ref 134–144)
TOTAL PROTEIN: 6.9 g/dL (ref 6.0–8.5)

## 2018-01-05 LAB — 25-HYDROXYVITAMIN D LCMS D2+D3: 25-HYDROXY, VITAMIN D: 40 ng/mL

## 2018-01-05 LAB — 25-HYDROXY VITAMIN D LCMS D2+D3: 25-Hydroxy, Vitamin D-3: 40 ng/mL

## 2018-01-16 ENCOUNTER — Other Ambulatory Visit: Payer: Self-pay

## 2018-01-16 ENCOUNTER — Ambulatory Visit
Admission: RE | Admit: 2018-01-16 | Discharge: 2018-01-16 | Disposition: A | Discharge status: Home / Self Care | Payer: Medicare Other | Source: Ambulatory Visit | Attending: Pain Medicine | Admitting: Pain Medicine

## 2018-01-16 ENCOUNTER — Encounter: Payer: Self-pay | Admitting: Pain Medicine

## 2018-01-16 ENCOUNTER — Ambulatory Visit (HOSPITAL_BASED_OUTPATIENT_CLINIC_OR_DEPARTMENT_OTHER): Payer: Medicare Other | Admitting: Pain Medicine

## 2018-01-16 VITALS — BP 123/65 | HR 74 | Temp 98.2°F | Resp 16 | Ht 62.0 in | Wt 145.0 lb

## 2018-01-16 DIAGNOSIS — M47816 Spondylosis without myelopathy or radiculopathy, lumbar region: Secondary | ICD-10-CM | POA: Diagnosis present

## 2018-01-16 DIAGNOSIS — G8929 Other chronic pain: Secondary | ICD-10-CM | POA: Diagnosis present

## 2018-01-16 DIAGNOSIS — S32010S Wedge compression fracture of first lumbar vertebra, sequela: Secondary | ICD-10-CM

## 2018-01-16 DIAGNOSIS — M545 Low back pain: Secondary | ICD-10-CM

## 2018-01-16 MED ORDER — FENTANYL CITRATE (PF) 100 MCG/2ML IJ SOLN
25.0000 ug | INTRAMUSCULAR | Status: DC | PRN
  Administered 2018-01-16: 50 ug via INTRAVENOUS
  Filled 2018-01-16: qty 2

## 2018-01-16 MED ORDER — TRIAMCINOLONE ACETONIDE 40 MG/ML IJ SUSP
40.0000 mg | Freq: Once | INTRAMUSCULAR | Status: AC
  Administered 2018-01-16: 40 mg
  Filled 2018-01-16: qty 1

## 2018-01-16 MED ORDER — LIDOCAINE HCL 2 % IJ SOLN
20.0000 mL | Freq: Once | INTRAMUSCULAR | Status: AC
  Administered 2018-01-16: 400 mg
  Filled 2018-01-16: qty 40

## 2018-01-16 MED ORDER — ROPIVACAINE HCL 2 MG/ML IJ SOLN
9.0000 mL | Freq: Once | INTRAMUSCULAR | Status: AC
  Administered 2018-01-16: 9 mL via PERINEURAL
  Filled 2018-01-16: qty 10

## 2018-01-16 MED ORDER — LACTATED RINGERS IV SOLN
1000.0000 mL | Freq: Once | INTRAVENOUS | Status: AC
  Administered 2018-01-16: 1000 mL via INTRAVENOUS

## 2018-01-16 MED ORDER — MIDAZOLAM HCL 5 MG/5ML IJ SOLN
1.0000 mg | INTRAMUSCULAR | Status: DC | PRN
  Administered 2018-01-16: 1 mg via INTRAVENOUS
  Filled 2018-01-16: qty 5

## 2018-01-16 NOTE — Progress Notes (Signed)
Patient's Name: Angela Espinoza  MRN: [id number]  Referring Provider: Toy CookeyHeadrick, Emily, FNP  DOB: 1955/02/18  PCP: Toy CookeyHeadrick, Emily, FNP  DOS: 01/16/2018  Note by: Oswaldo DoneFrancisco A Eria Lozoya, MD  Service setting: Ambulatory outpatient  Specialty: Interventional Pain Management  Patient type: Established  Location: [facility] (AMB) Pain Management Facility  Visit type: Interventional Procedure   Primary Reason for Visit: Interventional Pain Management Treatment. CC: Back Pain (left, lower)  Procedure:          Anesthesia, Analgesia, Anxiolysis:  Type: Lumbar Facet, Medial Branch Block(s) #1  Primary Purpose: Diagnostic Region: Posterolateral Lumbosacral Spine Level: T12, L1, L2, & L3 Medial Branch Level(s).  Laterality: Left  Type: Moderate (Conscious) Sedation combined with Local Anesthesia Indication(s): Analgesia and Anxiety Route: Intravenous (IV) IV Access: Secured Sedation: Meaningful verbal contact was maintained at all times during the procedure  Local Anesthetic: Lidocaine 1-2%  Position: Prone   Indications: 1. Spondylosis without myelopathy or radiculopathy, lumbar region   2. Lumbar facet joint syndrome (Left)   3. Traumatic compression fracture of L1 lumbar vertebra, sequela   4. Chronic low back pain (Primary Area of Pain) (Left) w/o sciatica   5. Lumbar facet joint syndrome   6. Chronic left-sided low back pain without sciatica    Pain Score: Pre-procedure: 4 /10 Post-procedure: 0-No pain/10  Pre-op Assessment:  Angela Espinoza is a 62 y.o. (year old), female patient, seen today for interventional treatment. She  has a past surgical history that includes Oophorectomy; Exploratory laparotomy; Foot surgery; Abdominal hysterectomy; Colonoscopy; Tonsillectomy; Axillary lymph node biopsy (Right, 08/03/2014); and Kyphoplasty (N/A, 12/10/2014). Angela Espinoza has a current medication list which includes the following prescription(s): amitriptyline, biotin, black pepper-turmeric,  cholecalciferol, cyclobenzaprine, duloxetine, gabapentin, hydrochlorothiazide, levocetirizine, lidocaine, losartan, magnesium, metoprolol succinate, multivitamin, omega-3 fatty acids, and vitamin c, and the following Facility-Administered Medications: fentanyl, lactated ringers, and midazolam. Her primarily concern today is the Back Pain (left, lower)  Initial Vital Signs:  Pulse/HCG Rate: 71ECG Heart Rate: 75 Temp: 98.2 F (36.8 C) Resp: 16 BP: 101/66 SpO2: 100 %  BMI: Estimated body mass index is 26.52 kg/m as calculated from the following:   Height as of this encounter: 5\' 2"  (1.575 m).   Weight as of this encounter: 145 lb (65.8 kg).  Risk Assessment: Allergies: Reviewed. She is allergic to indapamide.  Allergy Precautions: None required Coagulopathies: Reviewed. None identified.  Blood-thinner therapy: None at this time Active Infection(s): Reviewed. None identified. Angela Espinoza is afebrile  Site Confirmation: Angela Espinoza was asked to confirm the procedure and laterality before marking the site Procedure checklist: Completed Consent: Before the procedure and under the influence of no sedative(s), amnesic(s), or anxiolytics, the patient was informed of the treatment options, risks and possible complications. To fulfill our ethical and legal obligations, as recommended by the American Medical Association's Code of Ethics, I have informed the patient of my clinical impression; the nature and purpose of the treatment or procedure; the risks, benefits, and possible complications of the intervention; the alternatives, including doing nothing; the risk(s) and benefit(s) of the alternative treatment(s) or procedure(s); and the risk(s) and benefit(s) of doing nothing. The patient was provided information about the general risks and possible complications associated with the procedure. These may include, but are not limited to: failure to achieve desired goals, infection, bleeding, organ or  nerve damage, allergic reactions, paralysis, and death. In addition, the patient was informed of those risks and complications associated to Spine-related procedures, such as failure to decrease pain; infection (i.e.: Meningitis,  epidural or intraspinal abscess); bleeding (i.e.: epidural hematoma, subarachnoid hemorrhage, or any other type of intraspinal or peri-dural bleeding); organ or nerve damage (i.e.: Any type of peripheral nerve, nerve root, or spinal cord injury) with subsequent damage to sensory, motor, and/or autonomic systems, resulting in permanent pain, numbness, and/or weakness of one or several areas of the body; allergic reactions; (i.e.: anaphylactic reaction); and/or death. Furthermore, the patient was informed of those risks and complications associated with the medications. These include, but are not limited to: allergic reactions (i.e.: anaphylactic or anaphylactoid reaction(s)); adrenal axis suppression; blood sugar elevation that in diabetics may result in ketoacidosis or comma; water retention that in patients with history of congestive heart failure may result in shortness of breath, pulmonary edema, and decompensation with resultant heart failure; weight gain; swelling or edema; medication-induced neural toxicity; particulate matter embolism and blood vessel occlusion with resultant organ, and/or nervous system infarction; and/or aseptic necrosis of one or more joints. Finally, the patient was informed that Medicine is not an exact science; therefore, there is also the possibility of unforeseen or unpredictable risks and/or possible complications that may result in a catastrophic outcome. The patient indicated having understood very clearly. We have given the patient no guarantees and we have made no promises. Enough time was given to the patient to ask questions, all of which were answered to the patient's satisfaction. Angela Espinoza has indicated that she wanted to continue with the  procedure. Attestation: I, the ordering provider, attest that I have discussed with the patient the benefits, risks, side-effects, alternatives, likelihood of achieving goals, and potential problems during recovery for the procedure that I have provided informed consent. Date  Time: 01/16/2018  9:03 AM  Pre-Procedure Preparation:  Monitoring: As per clinic protocol. Respiration, ETCO2, SpO2, BP, heart rate and rhythm monitor placed and checked for adequate function Safety Precautions: Patient was assessed for positional comfort and pressure points before starting the procedure. Time-out: I initiated and conducted the "Time-out" before starting the procedure, as per protocol. The patient was asked to participate by confirming the accuracy of the "Time Out" information. Verification of the correct person, site, and procedure were performed and confirmed by me, the nursing staff, and the patient. "Time-out" conducted as per Joint Commission's Universal Protocol (UP.01.01.01). Time: 1019  Description of Procedure:          Laterality: Left Levels:  T12, L1, L2, & L3 Medial Branch Level(s) Area Prepped: Posterior Lumbosacral Region Prepping solution: ChloraPrep (2% chlorhexidine gluconate and 70% isopropyl alcohol) Safety Precautions: Aspiration looking for blood return was conducted prior to all injections. At no point did we inject any substances, as a needle was being advanced. Before injecting, the patient was told to immediately notify me if she was experiencing any new onset of "ringing in the ears, or metallic taste in the mouth". No attempts were made at seeking any paresthesias. Safe injection practices and needle disposal techniques used. Medications properly checked for expiration dates. SDV (single dose vial) medications used. After the completion of the procedure, all disposable equipment used was discarded in the proper designated medical waste containers. Local Anesthesia: Protocol  guidelines were followed. The patient was positioned over the fluoroscopy table. The area was prepped in the usual manner. The time-out was completed. The target area was identified using fluoroscopy. A 12-in long, straight, sterile hemostat was used with fluoroscopic guidance to locate the targets for each level blocked. Once located, the skin was marked with an approved surgical skin marker. Once all  sites were marked, the skin (epidermis, dermis, and hypodermis), as well as deeper tissues (fat, connective tissue and muscle) were infiltrated with a small amount of a short-acting local anesthetic, loaded on a 10cc syringe with a 25G, 1.5-in  Needle. An appropriate amount of time was allowed for local anesthetics to take effect before proceeding to the next step. Local Anesthetic: Lidocaine 2.0% The unused portion of the local anesthetic was discarded in the proper designated containers. Technical explanation of process:  T12 Medial Branch Nerve Block (MBB): The target area for the T12 medial branch is at the junction of the postero-lateral aspect of the superior articular process and the superior, posterior, and medial edge of the transverse process of L1. Under fluoroscopic guidance, a Quincke needle was inserted until contact was made with os over the superior postero-lateral aspect of the pedicular shadow (target area). After negative aspiration for blood, 0.5 mL of the nerve block solution was injected without difficulty or complication. The needle was removed intact. L1 Medial Branch Nerve Block (MBB): The target area for the L1 medial branch is at the junction of the postero-lateral aspect of the superior articular process and the superior, posterior, and medial edge of the transverse process of L2. Under fluoroscopic guidance, a Quincke needle was inserted until contact was made with os over the superior postero-lateral aspect of the pedicular shadow (target area). After negative aspiration for blood,  0.5 mL of the nerve block solution was injected without difficulty or complication. The needle was removed intact. L2 Medial Branch Nerve Block (MBB): The target area for the L2 medial branch is at the junction of the postero-lateral aspect of the superior articular process and the superior, posterior, and medial edge of the transverse process of L3. Under fluoroscopic guidance, a Quincke needle was inserted until contact was made with os over the superior postero-lateral aspect of the pedicular shadow (target area). After negative aspiration for blood, 0.5 mL of the nerve block solution was injected without difficulty or complication. The needle was removed intact. L3 Medial Branch Nerve Block (MBB): The target area for the L3 medial branch is at the junction of the postero-lateral aspect of the superior articular process and the superior, posterior, and medial edge of the transverse process of L4. Under fluoroscopic guidance, a Quincke needle was inserted until contact was made with os over the superior postero-lateral aspect of the pedicular shadow (target area). After negative aspiration for blood, 0.5 mL of the nerve block solution was injected without difficulty or complication. The needle was removed intact. Procedural Needles: 22-gauge, 3.5-inch, Quincke needles used for all levels. Nerve block solution: 0.2% PF-Ropivacaine + Triamcinolone (40 mg/mL) diluted to a final concentration of 4 mg of Triamcinolone/mL of Ropivacaine The unused portion of the solution was discarded in the proper designated containers.  Once the entire procedure was completed, the treated area was cleaned, making sure to leave some of the prepping solution back to take advantage of its long term bactericidal properties.   Illustration of the posterior view of the lumbar spine and the posterior neural structures. Laminae of L2 through S1 are labeled. DPRL5, dorsal primary ramus of L5; DPRS1, dorsal primary ramus of S1; DPR3,  dorsal primary ramus of L3; FJ, facet (zygapophyseal) joint L3-L4; I, inferior articular process of L4; LB1, lateral branch of dorsal primary ramus of L1; IAB, inferior articular branches from L3 medial branch (supplies L4-L5 facet joint); IBP, intermediate branch plexus; MB3, medial branch of dorsal primary ramus of L3; NR3, third  lumbar nerve root; S, superior articular process of L5; SAB, superior articular branches from L4 (supplies L4-5 facet joint also); TP3, transverse process of L3.  Vitals:   01/16/18 1025 01/16/18 1033 01/16/18 1043 01/16/18 1055  BP: 137/80 127/82 130/78 123/65  Pulse: 74     Resp: 14 16 16 16   Temp:  98.2 F (36.8 C)    TempSrc:      SpO2: 99% 99% 99% 98%  Weight:      Height:         Start Time: 1019 hrs. End Time: 1023 hrs.  Imaging Guidance (Spinal):          Type of Imaging Technique: Fluoroscopy Guidance (Spinal) Indication(s): Assistance in needle guidance and placement for procedures requiring needle placement in or near specific anatomical locations not easily accessible without such assistance. Exposure Time: Please see nurses notes. Contrast: None used. Fluoroscopic Guidance: I was personally present during the use of fluoroscopy. "Tunnel Vision Technique" used to obtain the best possible view of the target area. Parallax error corrected before commencing the procedure. "Direction-depth-direction" technique used to introduce the needle under continuous pulsed fluoroscopy. Once target was reached, antero-posterior, oblique, and lateral fluoroscopic projection used confirm needle placement in all planes. Images permanently stored in EMR. Interpretation: No contrast injected. I personally interpreted the imaging intraoperatively. Adequate needle placement confirmed in multiple planes. Permanent images saved into the patient's record.  Antibiotic Prophylaxis:   Anti-infectives (From admission, onward)   None     Indication(s): None  identified  Post-operative Assessment:  Post-procedure Vital Signs:  Pulse/HCG Rate: 7481 Temp: 98.2 F (36.8 C) Resp: 16 BP: 123/65 SpO2: 98 %  EBL: None  Complications: No immediate post-treatment complications observed by team, or reported by patient.  Note: The patient tolerated the entire procedure well. A repeat set of vitals were taken after the procedure and the patient was kept under observation following institutional policy, for this type of procedure. Post-procedural neurological assessment was performed, showing return to baseline, prior to discharge. The patient was provided with post-procedure discharge instructions, including a section on how to identify potential problems. Should any problems arise concerning this procedure, the patient was given instructions to immediately contact us, at any time, without hesitation. In any case, we plan to contact the patient by telephone for a follow-up status report regarding this interventional procedure.  Comments:  No additional relevant information.  Plan of Care    Imaging Orders     DG C-Arm 1-60 Min-No Report  Procedure Orders     LUMBAR FACET(MEDIAL BRANCH NERVE BLOCK) MBNB  Medications ordered for procedure: Meds ordered this encounter  Medications  . lidocaine (XYLOCAINE) 2 % (with pres) injection 400 mg  . midazolam (VERSED) 5 MG/5ML injection 1-2 mg    Make sure Flumazenil is available in the pyxis when using this medication. If oversedation occurs, administer 0.2 mg IV over 15 sec. If after 45 sec no response, administer 0.2 mg again over 1 min; may repeat at 1 min intervals; not to exceed 4 doses (1 mg)  . fentaNYL (SUBLIMAZE) injection 25-50 mcg    Make sure Narcan is available in the pyxis when using this medication. In the event of respiratory depression (RR< 8/min): Titrate NARCAN (naloxone) in increments of 0.1 to 0.2 mg IV at 2-3 minute intervals, until desired degree of reversal.  . lactated ringers  infusion 1,000 mL  . ropivacaine (PF) 2 mg/mL (0.2%) (NAROPIN) injection 9 mL  . triamcinolone acetonide (KENALOG-40) injection  40 mg   Medications administered: We administered lidocaine, midazolam, fentaNYL, lactated ringers, ropivacaine (PF) 2 mg/mL (0.2%), and triamcinolone acetonide.  See the medical record for exact dosing, route, and time of administration.  Disposition: Discharge home  Discharge Date & Time: 01/16/2018; 1056 hrs.   Physician-requested Follow-up: Return for post-procedure eval (2 wks), w/ Dr. Laban Emperor.  Future Appointments  Date Time Provider Department Center  02/12/2018 10:15 AM Delano Metz, MD [facility] None   Primary Care Physician: Toy Cookey, FNP Location: [facility] Facility Note by: Oswaldo Done, MD Date: 01/16/2018; Time: 11:01 AM  Disclaimer:  Medicine is not an exact science. The only guarantee in medicine is that nothing is guaranteed. It is important to note that the decision to proceed with this intervention was based on the information collected from the patient. The Data and conclusions were drawn from the patient's questionnaire, the interview, and the physical examination. Because the information was provided in large part by the patient, it cannot be guaranteed that it has not been purposely or unconsciously manipulated. Every effort has been made to obtain as much relevant data as possible for this evaluation. It is important to note that the conclusions that lead to this procedure are derived in large part from the available data. Always take into account that the treatment will also be dependent on availability of resources and existing treatment guidelines, considered by other Pain Management Practitioners as being common knowledge and practice, at the time of the intervention. For Medico-Legal purposes, it is also important to point out that variation in procedural techniques and pharmacological choices are  the acceptable norm. The indications, contraindications, technique, and results of the above procedure should only be interpreted and judged by a Board-Certified Interventional Pain Specialist with extensive familiarity and expertise in the same exact procedure and technique.

## 2018-01-16 NOTE — Patient Instructions (Addendum)
____________________________________________________________________________________________  Post-Procedure Discharge Instructions  Instructions:  Apply ice: Fill a plastic sandwich bag with crushed ice. Cover it with a small towel and apply to injection site. Apply for 15 minutes then remove x 15 minutes. Repeat sequence on day of procedure, until you go to bed. The purpose is to minimize swelling and discomfort after procedure.  Apply heat: Apply heat to procedure site starting the day following the procedure. The purpose is to treat any soreness and discomfort from the procedure.  Food intake: Start with clear liquids (like water) and advance to regular food, as tolerated.   Physical activities: Keep activities to a minimum for the first 8 hours after the procedure.   Driving: If you have received any sedation, you are not allowed to drive for 24 hours after your procedure.  Blood thinner: Restart your blood thinner 6 hours after your procedure. (Only for those taking blood thinners)  Insulin: As soon as you can eat, you may resume your normal dosing schedule. (Only for those taking insulin)  Infection prevention: Keep procedure site clean and dry.  Post-procedure Pain Diary: Extremely important that this be done correctly and accurately. Recorded information will be used to determine the next step in treatment.  Pain evaluated is that of treated area only. Do not include pain from an untreated area.  Complete every hour, on the hour, for the initial 8 hours. Set an alarm to help you do this part accurately.  Do not go to sleep and have it completed later. It will not be accurate.  Follow-up appointment: Keep your follow-up appointment after the procedure. Usually 2 weeks for most procedures. (6 weeks in the case of radiofrequency.) Bring you pain diary.   Expect:  From numbing medicine (AKA: Local Anesthetics): Numbness or decrease in pain.  Onset: Full effect within 15  minutes of injected.  Duration: It will depend on the type of local anesthetic used. On the average, 1 to 8 hours.   From steroids: Decrease in swelling or inflammation. Once inflammation is improved, relief of the pain will follow.  Onset of benefits: Depends on the amount of swelling present. The more swelling, the longer it will take for the benefits to be seen. In some cases, up to 10 days.  Duration: Steroids will stay in the system x 2 weeks. Duration of benefits will depend on multiple posibilities including persistent irritating factors.  Occasional side-effects: Facial flushing (red, warm cheeks) , cramps (if present, drink Gatorade and take over-the-counter Magnesium 450-500 mg once to twice a day).  From procedure: Some discomfort is to be expected once the numbing medicine wears off. This should be minimal if ice and heat are applied as instructed.  Call if:  You experience numbness and weakness that gets worse with time, as opposed to wearing off.  New onset bowel or bladder incontinence. (This applies to Spinal procedures only)  Emergency Numbers:  Durning business hours (Monday - Thursday, 8:00 AM - 4:00 PM) (Friday, 9:00 AM - 12:00 Noon): [phone]  After hours: [phone] ____________________________________________________________________________________________   Post-procedure Information What to expect: Most procedures involve the use of a local anesthetic (numbing medicine), and a steroid (anti-inflammatory medicine).  The local anesthetics may cause temporary numbness and weakness of the legs or arms, depending on the location of the block. This numbness/weakness may last 4-6 hours, depending on the local anesthetic used. In rare instances, it can last up to 24 hours. While numb, you must be very careful not to   injure the extremity.  After any procedure, you could expect the pain to get better within 15-20 minutes. This relief is temporary and may  last 4-6 hours. Once the local anesthetics wears off, you could experience discomfort, possibly more than usual, for up to 10 (ten) days. In the case of radiofrequencies, it may last up to 6 weeks. Surgeries may take up to 8 weeks for the healing process. The discomfort is due to the irritation caused by needles going through skin and muscle. To minimize the discomfort, we recommend using ice the first day, and heat from then on. The ice should be applied for 15 minutes on, and 15 minutes off. Keep repeating this cycle until bedtime. Avoid applying the ice directly to the skin, to prevent frostbite. Heat should be used daily, until the pain improves (4-10 days). Be careful not to burn yourself.  Occasionally you may experience muscle spasms or cramps. These occur as a consequence of the irritation caused by the needle sticks to the muscle and the blood that will inevitably be lost into the surrounding muscle tissue. Blood tends to be very irritating to tissues, which tend to react by going into spasm. These spasms may start the same day of your procedure, but they may also take days to develop. This late onset type of spasm or cramp is usually caused by electrolyte imbalances triggered by the steroids, at the level of the kidney. Cramps and spasms tend to respond well to muscle relaxants, multivitamins (some are triggered by the procedure, but may have their origins in vitamin deficiencies), and "Gatorade", or any sports drinks that can replenish any electrolyte imbalances. (If you are a diabetic, ask your pharmacist to get you a sugar-free brand.) Warm showers or baths may also be helpful. Stretching exercises are highly recommended. General Instructions:  Be alert for signs of possible infection: redness, swelling, heat, red streaks, elevated temperature, and/or fever. These typically appear 4 to 6 days after the procedure. Immediately notify your doctor if you experience unusual bleeding, difficulty  breathing, or loss of bowel or bladder control. If you experience increased pain, do not increase your pain medicine intake, unless instructed by your pain physician. Post-Procedure Care:  Be careful in moving about. Muscle spasms in the area of the injection may occur. Applying ice or heat to the area is often helpful. The incidence of spinal headaches after epidural injections ranges between 1.4% and 6%. If you develop a headache that does not seem to respond to conservative therapy, please let your physician know. This can be treated with an epidural blood patch.   Post-procedure numbness or redness is to be expected, however it should average 4 to 6 hours. If numbness and weakness of your extremities begins to develop 4 to 6 hours after your procedure, and is felt to be progressing and worsening, immediately contact your physician.   Diet:  If you experience nausea, do not eat until this sensation goes away. If you had a "Stellate Ganglion Block" for upper extremity "Reflex Sympathetic Dystrophy", do not eat or drink until your hoarseness goes away. In any case, always start with liquids first and if you tolerate them well, then slowly progress to more solid foods. Activity:  For the first 4 to 6 hours after the procedure, use caution in moving about as you may experience numbness and/or weakness. Use caution in cooking, using household electrical appliances, and climbing steps. If you need to reach your Doctor call our office: [phone] Monday-Thursday 8:00   am - 4:00 PM    Fridays: Closed     In case of an emergency: In case of emergency, call 911 or go to the nearest emergency room and have the physician there call us.  Interpretation of Procedure Every nerve block has two components: a diagnostic component, and a treatment component. Unrealistic expectations are the most common causes of "perceived failure".  In a perfect world, a single nerve block should be able to completely and  permanently eliminate the pain. Sadly, the world is not perfect.  Most pain management nerve blocks are performed using local anesthetics and steroids. Steroids are responsible for any long-term benefit that you may experience. Their purpose is to decrease any chronic swelling that may exist in the area. Steroids begin to work immediately after being injected. However, most patients will not experience any benefits until 5 to 10 days after the injection, when the swelling has come down to the point where they can tell a difference. Steroids will only help if there is swelling to be treated. As such, they can assist with the diagnosis. If effective, they suggest an inflammatory component to the pain, and if ineffective, they rule out inflammation as the main cause or component of the problem. If the problem is one of mechanical compression, you will get no benefit from those steroids.   In the case of local anesthetics, they have a crucial role in the diagnosis of your condition. Most will begin to work within15 to 20 minutes after injection. The duration will depend on the type used (short- vs. Long-acting). It is of outmost importance that patients keep tract of their pain, after the procedure. To assist with this matter, a "Post-procedure Pain Diary" is provided. Make sure to complete it and to bring it back to your follow-up appointment.  As long as the patient keeps accurate, detailed records of their symptoms after every procedure, and returns to have those interpreted, every procedure will provide us with invaluable information. Even a block that does not provide the patient with any relief, will always provide us with information about the mechanism and the origin of the pain. The only time a nerve block can be considered a waste of time is when patients do not keep track of the results, or do not keep their post-procedure appointment.  Reporting the results back to your physician The Pain  Score  Pain is a subjective complaint. It cannot be seen, touched, or measured. We depend entirely on the patient's report of the pain in order to assess your condition and treatment. To evaluate the pain, we use a pain scale, where "0" means "No Pain", and a "10" is "the worst possible pain that you can even imagine" (i.e. something like been eaten alive by a shark or being torn apart by a lion).   You will frequently be asked to rate your pain. Please be as accurate, remember that medical decisions will be based on your responses. Please do not rate your pain above a 10. Doing so is actually interpreted as "symptom magnification" (exaggeration), as well as lack of understanding with regards to the scale. To put this into perspective, when you tell us that your pain is at a 10 (ten), what you are saying is that there is nothing we can do to make this pain any worse. (Carefully think about that.) 

## 2018-01-17 ENCOUNTER — Telehealth: Payer: Self-pay

## 2018-01-17 NOTE — Telephone Encounter (Signed)
Post procedure phone call.  LM 

## 2018-02-09 NOTE — Progress Notes (Signed)
Patient's Name: Angela Espinoza  MRN: [id number]  Referring Provider: Gennette Pac, FNP  DOB: 04-09-1955  PCP: Gennette Pac, FNP  DOS: 02/12/2018  Note by: Gaspar Cola, MD  Service setting: Ambulatory outpatient  Specialty: Interventional Pain Management  Location: [facility] (AMB) Pain Management Facility    Patient type: Established   Primary Reason(s) for Visit: Encounter for post-procedure evaluation of chronic illness with mild to moderate exacerbation CC: Back Pain  HPI  Angela Espinoza is a 63 y.o. year old, female patient, who comes today for a post-procedure evaluation. She has Allergy; Anxiety; Asthma without status asthmaticus; Depression; Hypertension; Postmenopause; Chronic low back pain (Primary Area of Pain) (Left) w/o sciatica; Chronic pain syndrome; Pharmacologic therapy; Disorder of skeletal system; Problems influencing health status; History of L1 kyphoplasty; Traumatic compression fracture of L1 lumbar vertebra, sequela; Spondylosis without myelopathy or radiculopathy, lumbar region; and Lumbar facet joint syndrome (Left) on their problem list. Her primarily concern today is the Back Pain  Pain Assessment: Location:   Back Radiating: no pain Duration: Chronic pain Severity: 0-No pain/10 (subjective, self-reported pain score)  Note: Reported level is compatible with observation.                               Effect on ADL: better Modifying factors: no pain BP: 106/75  HR: (!) 103  Angela Espinoza comes in today for post-procedure evaluation.  Further details on both, my assessment(s), as well as the proposed treatment plan, please see below.  Post-Procedure Assessment  01/16/2018 Procedure: Diagnostic left thoracolumbar facet medial branch (T12, L1, L2, & L3) block #1 under fluoroscopic guidance and IV sedation Pre-procedure pain score:  4/10 Post-procedure pain score: 0/10 (100% relief) Influential Factors: BMI: 26.52 kg/m Intra-procedural challenges: None  observed.         Assessment challenges: None detected.              Reported side-effects: None.        Post-procedural adverse reactions or complications: None reported         Sedation: Sedation provided. When no sedatives are used, the analgesic levels obtained are directly associated to the effectiveness of the local anesthetics. However, when sedation is provided, the level of analgesia obtained during the initial 1 hour following the intervention, is believed to be the result of a combination of factors. These factors may include, but are not limited to: 1. The effectiveness of the local anesthetics used. 2. The effects of the analgesic(s) and/or anxiolytic(s) used. 3. The degree of discomfort experienced by the patient at the time of the procedure. 4. The patients ability and reliability in recalling and recording the events. 5. The presence and influence of possible secondary gains and/or psychosocial factors. Reported result: Relief experienced during the 1st hour after the procedure: 100 % (Ultra-Short Term Relief)            Interpretative annotation: Clinically appropriate result. Analgesia during this period is likely to be Local Anesthetic and/or IV Sedative (Analgesic/Anxiolytic) related.          Effects of local anesthetic: The analgesic effects attained during this period are directly associated to the localized infiltration of local anesthetics and therefore cary significant diagnostic value as to the etiological location, or anatomical origin, of the pain. Expected duration of relief is directly dependent on the pharmacodynamics of the local anesthetic used. Long-acting (4-6 hours) anesthetics used.  Reported result: Relief during the next 4  to 6 hour after the procedure: 100 % (Short-Term Relief)            Interpretative annotation: Clinically appropriate result. Analgesia during this period is likely to be Local Anesthetic-related.          Long-term benefit: Defined as the  period of time past the expected duration of local anesthetics (1 hour for short-acting and 4-6 hours for long-acting). With the possible exception of prolonged sympathetic blockade from the local anesthetics, benefits during this period are typically attributed to, or associated with, other factors such as analgesic sensory neuropraxia, antiinflammatory effects, or beneficial biochemical changes provided by agents other than the local anesthetics.  Reported result: Extended relief following procedure: 100 % (Long-Term Relief)            Interpretative annotation: Clinically possible results. Good relief. No permanent benefit expected. Inflammation plays a part in the etiology to the pain.          Current benefits: Defined as reported results that persistent at this point in time.   Analgesia: 100 %            Function: Angela Espinoza reports improvement in function ROM: Angela Espinoza reports improvement in ROM Interpretative annotation: Complete relief. Therapeutic success. Effective therapeutic approach. Benefit could be steroid-related.  Interpretation: Results would suggest a successful diagnostic intervention.                  Plan:  Set up procedure as a PRN palliative treatment option for this patient.                Laboratory Chemistry  Inflammation Markers (CRP: Acute Phase) (ESR: Chronic Phase) Lab Results  Component Value Date   CRP 1 12/10/2017   ESRSEDRATE 3 12/10/2017                         Rheumatology Markers No results found.  Renal Markers Lab Results  Component Value Date   BUN 10 01/01/2018   CREATININE 0.88 01/01/2018   BCR 11 (L) 01/01/2018   GFRAA 81 01/01/2018   GFRNONAA 70 01/01/2018                             Hepatic Markers Lab Results  Component Value Date   AST 26 01/01/2018   ALT 42 08/10/2011   ALBUMIN 4.6 01/01/2018                        Neuropathy Markers Lab Results  Component Value Date   VITAMINB12 1,807 (H) 12/10/2017                         Hematology Parameters Lab Results  Component Value Date   PLT 255 08/12/2011   HGB 12.4 08/12/2011   HCT 35.0 08/12/2011                        CV Markers No results found.  Note: Lab results reviewed.  Recent Imaging Results   Results for orders placed in visit on 01/16/18  DG C-Arm 1-60 Min-No Report   Narrative Fluoroscopy was utilized by the requesting physician.  No radiographic  interpretation.    Interpretation Report: Fluoroscopy was used during the procedure to assist with needle guidance. The images were interpreted intraoperatively by the requesting physician.  Meds  Current Outpatient Medications:  .  amitriptyline (ELAVIL) 25 MG tablet, Take 25 mg by mouth at bedtime., Disp: , Rfl:  .  Biotin 1000 MCG CHEW, Chew by mouth., Disp: , Rfl:  .  Black Pepper-Turmeric (TURMERIC COMPLEX/BLACK PEPPER) 3-500 MG CAPS, Take by mouth., Disp: , Rfl:  .  Cholecalciferol (D3-1000) 25 MCG (1000 UT) tablet, Take 1,000 Units by mouth daily., Disp: , Rfl:  .  cyclobenzaprine (FLEXERIL) 5 MG tablet, Take by mouth 2 (two) times daily. , Disp: , Rfl:  .  DULoxetine (CYMBALTA) 30 MG capsule, Take 30 mg by mouth 2 (two) times daily., Disp: , Rfl:  .  gabapentin (NEURONTIN) 100 MG capsule, Take 100 mg by mouth 3 (three) times daily., Disp: , Rfl:  .  hydrochlorothiazide (MICROZIDE) 12.5 MG capsule, Take 12.5 mg by mouth daily., Disp: , Rfl:  .  levocetirizine (XYZAL) 5 MG tablet, Take by mouth., Disp: , Rfl:  .  lidocaine (LIDODERM) 5 %, Place 1 patch onto the skin daily. Remove & Discard patch within 12 hours or as directed by MD, Disp: , Rfl:  .  losartan (COZAAR) 25 MG tablet, Take 25 mg by mouth every morning., Disp: , Rfl:  .  Magnesium 250 MG TABS, Take by mouth., Disp: , Rfl:  .  metoprolol succinate (TOPROL-XL) 25 MG 24 hr tablet, Take 25 mg by mouth every morning., Disp: , Rfl:  .  Multiple Vitamin (MULTIVITAMIN) capsule, Take 1 capsule by mouth daily., Disp: , Rfl:   .  Omega-3 Fatty Acids (FISH OIL PO), Take 1 tablet by mouth daily., Disp: , Rfl:  .  vitamin C (ASCORBIC ACID) 500 MG tablet, Take 1,000 mg by mouth daily. , Disp: , Rfl:   ROS  Constitutional: Denies any fever or chills Gastrointestinal: No reported hemesis, hematochezia, vomiting, or acute GI distress Musculoskeletal: Denies any acute onset joint swelling, redness, loss of ROM, or weakness Neurological: No reported episodes of acute onset apraxia, aphasia, dysarthria, agnosia, amnesia, paralysis, loss of coordination, or loss of consciousness  Allergies  Ms. Ludwig is allergic to indapamide.  PFSH  Drug: Ms. Laba  reports no history of drug use. Alcohol:  reports current alcohol use. Tobacco:  reports that she is a non-smoker but has been exposed to tobacco smoke. She has never used smokeless tobacco. Medical:  has a past medical history of Anxiety, Asthma, Depression, Hyperlipidemia, Hypertension, and IBS (irritable bowel syndrome). Surgical: Ms. Hanser  has a past surgical history that includes Oophorectomy; Exploratory laparotomy; Foot surgery; Abdominal hysterectomy; Colonoscopy; Tonsillectomy; Axillary lymph node biopsy (Right, 08/03/2014); and Kyphoplasty (N/A, 12/10/2014). Family: family history is not on file.  Constitutional Exam  General appearance: Well nourished, well developed, and well hydrated. In no apparent acute distress Vitals:   02/12/18 1008  BP: 106/75  Pulse: (!) 103  Temp: 97.9 F (36.6 C)  SpO2: 100%  Weight: 145 lb (65.8 kg)  Height: 5' 2"  (1.575 m)   BMI Assessment: Estimated body mass index is 26.52 kg/m as calculated from the following:   Height as of this encounter: 5' 2"  (1.575 m).   Weight as of this encounter: 145 lb (65.8 kg).  BMI interpretation table: BMI level Category Range association with higher incidence of chronic pain  <18 kg/m2 Underweight   18.5-24.9 kg/m2 Ideal body weight   25-29.9 kg/m2 Overweight Increased  incidence by 20%  30-34.9 kg/m2 Obese (Class I) Increased incidence by 68%  35-39.9 kg/m2 Severe obesity (Class II) Increased incidence by 136%  >  40 kg/m2 Extreme obesity (Class III) Increased incidence by 254%   Patient's current BMI Ideal Body weight  Body mass index is 26.52 kg/m. Ideal body weight: 50.1 kg (110 lb 7.2 oz) Adjusted ideal body weight: 56.4 kg (124 lb 4.3 oz)   BMI Readings from Last 4 Encounters:  02/12/18 26.52 kg/m  01/16/18 26.52 kg/m  01/01/18 26.52 kg/m  12/10/17 26.52 kg/m   Wt Readings from Last 4 Encounters:  02/12/18 145 lb (65.8 kg)  01/16/18 145 lb (65.8 kg)  01/01/18 145 lb (65.8 kg)  12/10/17 145 lb (65.8 kg)  Psych/Mental status: Alert, oriented x 3 (person, place, & time)       Eyes: PERLA Respiratory: No evidence of acute respiratory distress  Cervical Spine Area Exam  Skin & Axial Inspection: No masses, redness, edema, swelling, or associated skin lesions Alignment: Symmetrical Functional ROM: Unrestricted ROM      Stability: No instability detected Muscle Tone/Strength: Functionally intact. No obvious neuro-muscular anomalies detected. Sensory (Neurological): Unimpaired Palpation: No palpable anomalies              Upper Extremity (UE) Exam    Side: Right upper extremity  Side: Left upper extremity  Skin & Extremity Inspection: Skin color, temperature, and hair growth are WNL. No peripheral edema or cyanosis. No masses, redness, swelling, asymmetry, or associated skin lesions. No contractures.  Skin & Extremity Inspection: Skin color, temperature, and hair growth are WNL. No peripheral edema or cyanosis. No masses, redness, swelling, asymmetry, or associated skin lesions. No contractures.  Functional ROM: Unrestricted ROM          Functional ROM: Unrestricted ROM          Muscle Tone/Strength: Functionally intact. No obvious neuro-muscular anomalies detected.  Muscle Tone/Strength: Functionally intact. No obvious neuro-muscular anomalies  detected.  Sensory (Neurological): Unimpaired          Sensory (Neurological): Unimpaired          Palpation: No palpable anomalies              Palpation: No palpable anomalies              Provocative Test(s):  Phalen's test: deferred Tinel's test: deferred Apley's scratch test (touch opposite shoulder):  Action 1 (Across chest): deferred Action 2 (Overhead): deferred Action 3 (LB reach): deferred   Provocative Test(s):  Phalen's test: deferred Tinel's test: deferred Apley's scratch test (touch opposite shoulder):  Action 1 (Across chest): deferred Action 2 (Overhead): deferred Action 3 (LB reach): deferred    Thoracic Spine Area Exam  Skin & Axial Inspection: No masses, redness, or swelling Alignment: Symmetrical Functional ROM: Improved after treatment Stability: No instability detected Muscle Tone/Strength: Functionally intact. No obvious neuro-muscular anomalies detected. Sensory (Neurological): Unimpaired Muscle strength & Tone: No palpable anomalies  Lumbar Spine Area Exam  Skin & Axial Inspection: No masses, redness, or swelling Alignment: Symmetrical Functional ROM: Improved after treatment       Stability: No instability detected Muscle Tone/Strength: Functionally intact. No obvious neuro-muscular anomalies detected. Sensory (Neurological): Unimpaired Palpation: No palpable anomalies       Provocative Tests: Hyperextension/rotation test: deferred today       Lumbar quadrant test (Kemp's test): deferred today       Lateral bending test: deferred today       Patrick's Maneuver: deferred today                   FABER* test: deferred today  S-I anterior distraction/compression test: deferred today         S-I lateral compression test: deferred today         S-I Thigh-thrust test: deferred today         S-I Gaenslen's test: deferred today         *(Flexion, ABduction and External Rotation)  Gait & Posture Assessment  Ambulation:  Unassisted Gait: Relatively normal for age and body habitus Posture: WNL   Lower Extremity Exam    Side: Right lower extremity  Side: Left lower extremity  Stability: No instability observed          Stability: No instability observed          Skin & Extremity Inspection: Skin color, temperature, and hair growth are WNL. No peripheral edema or cyanosis. No masses, redness, swelling, asymmetry, or associated skin lesions. No contractures.  Skin & Extremity Inspection: Skin color, temperature, and hair growth are WNL. No peripheral edema or cyanosis. No masses, redness, swelling, asymmetry, or associated skin lesions. No contractures.  Functional ROM: Unrestricted ROM                  Functional ROM: Unrestricted ROM                  Muscle Tone/Strength: Functionally intact. No obvious neuro-muscular anomalies detected.  Muscle Tone/Strength: Functionally intact. No obvious neuro-muscular anomalies detected.  Sensory (Neurological): Unimpaired        Sensory (Neurological): Unimpaired        DTR: Patellar: deferred today Achilles: deferred today Plantar: deferred today  DTR: Patellar: deferred today Achilles: deferred today Plantar: deferred today  Palpation: No palpable anomalies  Palpation: No palpable anomalies   Assessment  Primary Diagnosis & Pertinent Problem List: The primary encounter diagnosis was Lumbar facet joint syndrome (Left). Diagnoses of Chronic low back pain (Primary Area of Pain) (Left) w/o sciatica, Spondylosis without myelopathy or radiculopathy, lumbar region, Traumatic compression fracture of L1 lumbar vertebra, sequela, History of L1 kyphoplasty, and Chronic pain syndrome were also pertinent to this visit.  Status Diagnosis  Resolved Resolved Stable 1. Lumbar facet joint syndrome (Left)   2. Chronic low back pain (Primary Area of Pain) (Left) w/o sciatica   3. Spondylosis without myelopathy or radiculopathy, lumbar region   4. Traumatic compression fracture of  L1 lumbar vertebra, sequela   5. History of L1 kyphoplasty   6. Chronic pain syndrome     Problems updated and reviewed during this visit: No problems updated. Plan of Care  Pharmacotherapy (Medications Ordered): No orders of the defined types were placed in this encounter.  Medications administered today: Terryann Verbeek. Barnhard had no medications administered during this visit.   Procedure Orders     LUMBAR FACET(MEDIAL BRANCH NERVE BLOCK) [facility] Lab Orders  No laboratory test(s) ordered today   Imaging Orders  No imaging studies ordered today   Referral Orders  No referral(s) requested today   Interventional management options: Planned, scheduled, and/or pending:   None at this time   Considering:   Diagnostic left-sided thoracolumbar facet blocks from T12-L3 (T12, L1, L2, & L3) #2  Diagnostic left-sided L1-2 LESI #1    Palliative PRN treatment(s):   Diagnostic left thoracolumbar facet medial branch (T12, L1, L2, & L3) block #2 under fluoroscopic guidance and IV sedation   Provider-requested follow-up: Return if symptoms worsen or fail to improve, for PRN Procedure: (L) L-FCT BLK #2 (T12, L1, L2, & L3).  No future appointments. Primary Care  Physician: Gennette Pac, FNP Location: [facility] Note by: Gaspar Cola, MD Date: 02/12/2018; Time: 10:38 AM

## 2018-02-12 ENCOUNTER — Encounter: Payer: Self-pay | Admitting: Pain Medicine

## 2018-02-12 ENCOUNTER — Ambulatory Visit: Payer: Medicare Other | Attending: Pain Medicine | Admitting: Pain Medicine

## 2018-02-12 ENCOUNTER — Other Ambulatory Visit: Payer: Self-pay

## 2018-02-12 VITALS — BP 106/75 | HR 103 | Temp 97.9°F | Ht 62.0 in | Wt 145.0 lb

## 2018-02-12 DIAGNOSIS — G894 Chronic pain syndrome: Secondary | ICD-10-CM | POA: Diagnosis present

## 2018-02-12 DIAGNOSIS — M545 Low back pain, unspecified: Secondary | ICD-10-CM

## 2018-02-12 DIAGNOSIS — G8929 Other chronic pain: Secondary | ICD-10-CM | POA: Insufficient documentation

## 2018-02-12 DIAGNOSIS — M47816 Spondylosis without myelopathy or radiculopathy, lumbar region: Secondary | ICD-10-CM | POA: Insufficient documentation

## 2018-02-12 DIAGNOSIS — Z9889 Other specified postprocedural states: Secondary | ICD-10-CM | POA: Insufficient documentation

## 2018-02-12 DIAGNOSIS — S32010S Wedge compression fracture of first lumbar vertebra, sequela: Secondary | ICD-10-CM | POA: Insufficient documentation

## 2018-02-12 NOTE — Patient Instructions (Signed)
____________________________________________________________________________________________  Preparing for Procedure with Sedation  Instructions: . Oral Intake: Do not eat or drink anything for at least 8 hours prior to your procedure. . Transportation: Public transportation is not allowed. Bring an adult driver. The driver must be physically present in our waiting room before any procedure can be started. . Physical Assistance: Bring an adult physically capable of assisting you, in the event you need help. This adult should keep you company at home for at least 6 hours after the procedure. . Blood Pressure Medicine: Take your blood pressure medicine with a sip of water the morning of the procedure. . Blood thinners: Notify our staff if you are taking any blood thinners. Depending on which one you take, there will be specific instructions on how and when to stop it. . Diabetics on insulin: Notify the staff so that you can be scheduled 1st case in the morning. If your diabetes requires high dose insulin, take only  of your normal insulin dose the morning of the procedure and notify the staff that you have done so. . Preventing infections: Shower with an antibacterial soap the morning of your procedure. . Build-up your immune system: Take 1000 mg of Vitamin C with every meal (3 times a day) the day prior to your procedure. . Antibiotics: Inform the staff if you have a condition or reason that requires you to take antibiotics before dental procedures. . Pregnancy: If you are pregnant, call and cancel the procedure. . Sickness: If you have a cold, fever, or any active infections, call and cancel the procedure. . Arrival: You must be in the facility at least 30 minutes prior to your scheduled procedure. . Children: Do not bring children with you. . Dress appropriately: Bring dark clothing that you would not mind if they get stained. . Valuables: Do not bring any jewelry or valuables.  Procedure  appointments are reserved for interventional treatments only. . No Prescription Refills. . No medication changes will be discussed during procedure appointments. . No disability issues will be discussed.  Reasons to call and reschedule or cancel your procedure: (Following these recommendations will minimize the risk of a serious complication.) . Surgeries: Avoid having procedures within 2 weeks of any surgery. (Avoid for 2 weeks before or after any surgery). . Flu Shots: Avoid having procedures within 2 weeks of a flu shots or . (Avoid for 2 weeks before or after immunizations). . Barium: Avoid having a procedure within 7-10 days after having had a radiological study involving the use of radiological contrast. (Myelograms, Barium swallow or enema study). . Heart attacks: Avoid any elective procedures or surgeries for the initial 6 months after a "Myocardial Infarction" (Heart Attack). . Blood thinners: It is imperative that you stop these medications before procedures. Let us know if you if you take any blood thinner.  . Infection: Avoid procedures during or within two weeks of an infection (including chest colds or gastrointestinal problems). Symptoms associated with infections include: Localized redness, fever, chills, night sweats or profuse sweating, burning sensation when voiding, cough, congestion, stuffiness, runny nose, sore throat, diarrhea, nausea, vomiting, cold or Flu symptoms, recent or current infections. It is specially important if the infection is over the area that we intend to treat. . Heart and lung problems: Symptoms that may suggest an active cardiopulmonary problem include: cough, chest pain, breathing difficulties or shortness of breath, dizziness, ankle swelling, uncontrolled high or unusually low blood pressure, and/or palpitations. If you are experiencing any of these symptoms, cancel   your procedure and contact your primary care physician for an evaluation.  Remember:   Regular Business hours are:  Monday to Thursday 8:00 AM to 4:00 PM  Provider's Schedule: Catlyn Shipton, MD:  Procedure days: Tuesday and Thursday 7:30 AM to 4:00 PM  Bilal Lateef, MD:  Procedure days: Monday and Wednesday 7:30 AM to 4:00 PM ____________________________________________________________________________________________    

## 2018-07-15 ENCOUNTER — Other Ambulatory Visit: Payer: Self-pay | Admitting: Pain Medicine

## 2018-07-21 ENCOUNTER — Other Ambulatory Visit
Admission: RE | Admit: 2018-07-21 | Discharge: 2018-07-21 | Disposition: A | Discharge status: Home / Self Care | Payer: Medicare Other | Source: Ambulatory Visit | Attending: Pain Medicine | Admitting: Pain Medicine

## 2018-07-21 ENCOUNTER — Other Ambulatory Visit: Payer: Self-pay

## 2018-07-21 DIAGNOSIS — Z1159 Encounter for screening for other viral diseases: Secondary | ICD-10-CM | POA: Insufficient documentation

## 2018-07-22 LAB — NOVEL CORONAVIRUS, NAA (HOSP ORDER, SEND-OUT TO REF LAB; TAT 18-24 HRS): SARS-CoV-2, NAA: NOT DETECTED

## 2018-07-24 ENCOUNTER — Encounter: Payer: Self-pay | Admitting: Pain Medicine

## 2018-07-24 ENCOUNTER — Ambulatory Visit (HOSPITAL_BASED_OUTPATIENT_CLINIC_OR_DEPARTMENT_OTHER): Payer: Medicare Other | Admitting: Pain Medicine

## 2018-07-24 ENCOUNTER — Other Ambulatory Visit: Payer: Self-pay

## 2018-07-24 ENCOUNTER — Ambulatory Visit
Admission: RE | Admit: 2018-07-24 | Discharge: 2018-07-24 | Disposition: A | Discharge status: Home / Self Care | Payer: Medicare Other | Source: Ambulatory Visit | Attending: Pain Medicine | Admitting: Pain Medicine

## 2018-07-24 VITALS — BP 112/60 | HR 61 | Temp 97.5°F | Resp 17 | Ht 62.0 in | Wt 145.0 lb

## 2018-07-24 DIAGNOSIS — M47816 Spondylosis without myelopathy or radiculopathy, lumbar region: Secondary | ICD-10-CM | POA: Insufficient documentation

## 2018-07-24 DIAGNOSIS — G8929 Other chronic pain: Secondary | ICD-10-CM

## 2018-07-24 DIAGNOSIS — M5137 Other intervertebral disc degeneration, lumbosacral region: Secondary | ICD-10-CM | POA: Insufficient documentation

## 2018-07-24 DIAGNOSIS — S32010S Wedge compression fracture of first lumbar vertebra, sequela: Secondary | ICD-10-CM | POA: Diagnosis present

## 2018-07-24 DIAGNOSIS — M51379 Other intervertebral disc degeneration, lumbosacral region without mention of lumbar back pain or lower extremity pain: Secondary | ICD-10-CM | POA: Insufficient documentation

## 2018-07-24 DIAGNOSIS — M545 Low back pain, unspecified: Secondary | ICD-10-CM

## 2018-07-24 MED ORDER — FENTANYL CITRATE (PF) 100 MCG/2ML IJ SOLN
25.0000 ug | INTRAMUSCULAR | Status: DC | PRN
  Administered 2018-07-24: 10:00:00 50 ug via INTRAVENOUS
  Filled 2018-07-24: qty 2

## 2018-07-24 MED ORDER — LACTATED RINGERS IV SOLN
1000.0000 mL | Freq: Once | INTRAVENOUS | Status: AC
  Administered 2018-07-24: 1000 mL via INTRAVENOUS

## 2018-07-24 MED ORDER — TRIAMCINOLONE ACETONIDE 40 MG/ML IJ SUSP
40.0000 mg | Freq: Once | INTRAMUSCULAR | Status: AC
  Administered 2018-07-24: 40 mg
  Filled 2018-07-24: qty 1

## 2018-07-24 MED ORDER — ROPIVACAINE HCL 2 MG/ML IJ SOLN
9.0000 mL | Freq: Once | INTRAMUSCULAR | Status: AC
  Administered 2018-07-24: 9 mL via PERINEURAL
  Filled 2018-07-24: qty 10

## 2018-07-24 MED ORDER — LIDOCAINE HCL 2 % IJ SOLN
20.0000 mL | Freq: Once | INTRAMUSCULAR | Status: AC
  Administered 2018-07-24: 400 mg
  Filled 2018-07-24: qty 20

## 2018-07-24 MED ORDER — MIDAZOLAM HCL 5 MG/5ML IJ SOLN
1.0000 mg | INTRAMUSCULAR | Status: DC | PRN
  Administered 2018-07-24: 2 mg via INTRAVENOUS
  Filled 2018-07-24: qty 5

## 2018-07-24 NOTE — Progress Notes (Signed)
Patient's Name: Angela Espinoza  MRN: [id number]  Referring Provider: Toy Cookey, FNP  DOB: 12-21-1955  PCP: Toy Cookey, FNP  DOS: 07/24/2018  Note by: Oswaldo Done, MD  Service setting: Ambulatory outpatient  Specialty: Interventional Pain Management  Patient type: Established  Location: [facility] (AMB) Pain Management Facility  Visit type: Interventional Procedure   Primary Reason for Visit: Interventional Pain Management Treatment. CC: Back Pain  Procedure:          Anesthesia, Analgesia, Anxiolysis:  Type: Lumbar Facet, Medial Branch Block(s) #2  Primary Purpose: Diagnostic Region: Posterolateral Lumbosacral Spine Level: T12, L1, L2, & L3  Medial Branch Level(s). Injecting these levels blocks the L1-2, L2-3, and L3-4 lumbar facet joints. Laterality: Left  Type: Moderate (Conscious) Sedation combined with Local Anesthesia Indication(s): Analgesia and Anxiety Route: Intravenous (IV) IV Access: Secured Sedation: Meaningful verbal contact was maintained at all times during the procedure  Local Anesthetic: Lidocaine 1-2%  Position: Prone   Indications: 1. Lumbar facet joint syndrome (Left)   2. Spondylosis without myelopathy or radiculopathy, lumbar region   3. Chronic low back pain (Primary Area of Pain) (Left) w/o sciatica   4. DDD (degenerative disc disease), lumbosacral   5. Traumatic compression fracture of L1 lumbar vertebra, sequela (2016)    Pain Score: Pre-procedure: 8 /10 Post-procedure: 0-No pain/10  Pre-op Assessment:  Angela Espinoza is a 63 y.o. (year old), female patient, seen today for interventional treatment. She  has a past surgical history that includes Oophorectomy; Exploratory laparotomy; Foot surgery; Abdominal hysterectomy; Colonoscopy; Tonsillectomy; Axillary lymph node biopsy (Right, 08/03/2014); and Kyphoplasty (N/A, 12/10/2014). Angela Espinoza has a current medication list which includes the following prescription(s): amitriptyline, biotin, black  pepper-turmeric, cholecalciferol, duloxetine, gabapentin, hydrochlorothiazide, levocetirizine, lidocaine, losartan, magnesium, meloxicam, metoprolol succinate, multivitamin, omega-3 fatty acids, vitamin c, and cyclobenzaprine, and the following Facility-Administered Medications: fentanyl and midazolam. Her primarily concern today is the Back Pain  Initial Vital Signs:  Pulse/HCG Rate: 63ECG Heart Rate: 62 Temp: 97.8 F (36.6 C) Resp: 14 BP: 98/75 SpO2: 100 %  BMI: Estimated body mass index is 26.52 kg/m as calculated from the following:   Height as of this encounter:  (1.575 m).   Weight as of this encounter: 145 lb (65.8 kg).  Risk Assessment: Allergies: Reviewed. She is allergic to indapamide.  Allergy Precautions: None required Coagulopathies: Reviewed. None identified.  Blood-thinner therapy: None at this time Active Infection(s): Reviewed. None identified. Angela Espinoza is afebrile  Site Confirmation: Angela Espinoza was asked to confirm the procedure and laterality before marking the site Procedure checklist: Completed Consent: Before the procedure and under the influence of no sedative(s), amnesic(s), or anxiolytics, the patient was informed of the treatment options, risks and possible complications. To fulfill our ethical and legal obligations, as recommended by the American Medical Association's Code of Ethics, I have informed the patient of my clinical impression; the nature and purpose of the treatment or procedure; the risks, benefits, and possible complications of the intervention; the alternatives, including doing nothing; the risk(s) and benefit(s) of the alternative treatment(s) or procedure(s); and the risk(s) and benefit(s) of doing nothing. The patient was provided information about the general risks and possible complications associated with the procedure. These may include, but are not limited to: failure to achieve desired goals, infection, bleeding, organ or nerve  damage, allergic reactions, paralysis, and death. In addition, the patient was informed of those risks and complications associated to Spine-related procedures, such as failure to decrease pain; infection (i.e.: Meningitis, epidural  or intraspinal abscess); bleeding (i.e.: epidural hematoma, subarachnoid hemorrhage, or any other type of intraspinal or peri-dural bleeding); organ or nerve damage (i.e.: Any type of peripheral nerve, nerve root, or spinal cord injury) with subsequent damage to sensory, motor, and/or autonomic systems, resulting in permanent pain, numbness, and/or weakness of one or several areas of the body; allergic reactions; (i.e.: anaphylactic reaction); and/or death. Furthermore, the patient was informed of those risks and complications associated with the medications. These include, but are not limited to: allergic reactions (i.e.: anaphylactic or anaphylactoid reaction(s)); adrenal axis suppression; blood sugar elevation that in diabetics may result in ketoacidosis or comma; water retention that in patients with history of congestive heart failure may result in shortness of breath, pulmonary edema, and decompensation with resultant heart failure; weight gain; swelling or edema; medication-induced neural toxicity; particulate matter embolism and blood vessel occlusion with resultant organ, and/or nervous system infarction; and/or aseptic necrosis of one or more joints. Finally, the patient was informed that Medicine is not an exact science; therefore, there is also the possibility of unforeseen or unpredictable risks and/or possible complications that may result in a catastrophic outcome. The patient indicated having understood very clearly. We have given the patient no guarantees and we have made no promises. Enough time was given to the patient to ask questions, all of which were answered to the patient's satisfaction. Angela Espinoza has indicated that she wanted to continue with the  procedure. Attestation: I, the ordering provider, attest that I have discussed with the patient the benefits, risks, side-effects, alternatives, likelihood of achieving goals, and potential problems during recovery for the procedure that I have provided informed consent. Date   Time: 07/24/2018 10:12 AM  Pre-Procedure Preparation:  Monitoring: As per clinic protocol. Respiration, ETCO2, SpO2, BP, heart rate and rhythm monitor placed and checked for adequate function Safety Precautions: Patient was assessed for positional comfort and pressure points before starting the procedure. Time-out: I initiated and conducted the "Time-out" before starting the procedure, as per protocol. The patient was asked to participate by confirming the accuracy of the "Time Out" information. Verification of the correct person, site, and procedure were performed and confirmed by me, the nursing staff, and the patient. "Time-out" conducted as per Joint Commission's Universal Protocol (UP.01.01.01). Time: 1036  Description of Procedure:          Laterality: Left Levels:  T12, L1, L2, & L3  Medial Branch Level(s) Area Prepped: Posterior Lumbosacral Region Prepping solution: DuraPrep (Iodine Povacrylex [0.7% available iodine] and Isopropyl Alcohol, 74% w/w) Safety Precautions: Aspiration looking for blood return was conducted prior to all injections. At no point did we inject any substances, as a needle was being advanced. Before injecting, the patient was told to immediately notify me if she was experiencing any new onset of "ringing in the ears, or metallic taste in the mouth". No attempts were made at seeking any paresthesias. Safe injection practices and needle disposal techniques used. Medications properly checked for expiration dates. SDV (single dose vial) medications used. After the completion of the procedure, all disposable equipment used was discarded in the proper designated medical waste containers. Local  Anesthesia: Protocol guidelines were followed. The patient was positioned over the fluoroscopy table. The area was prepped in the usual manner. The time-out was completed. The target area was identified using fluoroscopy. A 12-in long, straight, sterile hemostat was used with fluoroscopic guidance to locate the targets for each level blocked. Once located, the skin was marked with an approved surgical skin  marker. Once all sites were marked, the skin (epidermis, dermis, and hypodermis), as well as deeper tissues (fat, connective tissue and muscle) were infiltrated with a small amount of a short-acting local anesthetic, loaded on a 10cc syringe with a 25G, 1.5-in  Needle. An appropriate amount of time was allowed for local anesthetics to take effect before proceeding to the next step. Local Anesthetic: Lidocaine 2.0% The unused portion of the local anesthetic was discarded in the proper designated containers. Technical explanation of process:  T12 Medial Branch Nerve Block (MBB): The target area for the T12 medial branch is at the junction of the postero-lateral aspect of the superior articular process and the superior, posterior, and medial edge of the transverse process of T12. Under fluoroscopic guidance, a Quincke needle was inserted until contact was made with os over the superior postero-lateral aspect of the pedicular shadow (target area). After negative aspiration for blood, 0.5 mL of the nerve block solution was injected without difficulty or complication. The needle was removed intact. L1 Medial Branch Nerve Block (MBB): The target area for the L1 medial branch is at the junction of the postero-lateral aspect of the superior articular process and the superior, posterior, and medial edge of the transverse process of L1. Under fluoroscopic guidance, a Quincke needle was inserted until contact was made with os over the superior postero-lateral aspect of the pedicular shadow (target area). After negative  aspiration for blood, 0.5 mL of the nerve block solution was injected without difficulty or complication. The needle was removed intact. L2 Medial Branch Nerve Block (MBB): The target area for the L2 medial branch is at the junction of the postero-lateral aspect of the superior articular process and the superior, posterior, and medial edge of the transverse process of L2. Under fluoroscopic guidance, a Quincke needle was inserted until contact was made with os over the superior postero-lateral aspect of the pedicular shadow (target area). After negative aspiration for blood, 0.5 mL of the nerve block solution was injected without difficulty or complication. The needle was removed intact. L3 Medial Branch Nerve Block (MBB): The target area for the L3 medial branch is at the junction of the postero-lateral aspect of the superior articular process and the superior, posterior, and medial edge of the transverse process of L3. Under fluoroscopic guidance, a Quincke needle was inserted until contact was made with os over the superior postero-lateral aspect of the pedicular shadow (target area). After negative aspiration for blood, 0.5 mL of the nerve block solution was injected without difficulty or complication. The needle was removed intact.  Nerve block solution: 0.2% PF-Ropivacaine + Triamcinolone (40 mg/mL) diluted to a final concentration of 4 mg of Triamcinolone/mL of Ropivacaine The unused portion of the solution was discarded in the proper designated containers. Procedural Needles: 22-gauge, 3.5-inch, Quincke needles used for all levels.  Once the entire procedure was completed, the treated area was cleaned, making sure to leave some of the prepping solution back to take advantage of its long term bactericidal properties.   Illustration of the posterior view of the lumbar spine and the posterior neural structures. Laminae of L2 through S1 are labeled. DPRL5, dorsal primary ramus of L5; DPRS1, dorsal  primary ramus of S1; DPR3, dorsal primary ramus of L3; FJ, facet (zygapophyseal) joint L3-L4; I, inferior articular process of L4; LB1, lateral branch of dorsal primary ramus of L1; IAB, inferior articular branches from L3 medial branch (supplies L4-L5 facet joint); IBP, intermediate branch plexus; MB3, medial branch of dorsal primary ramus  of L3; NR3, third lumbar nerve root; S, superior articular process of L5; SAB, superior articular branches from L4 (supplies L4-5 facet joint also); TP3, transverse process of L3.  Vitals:   07/24/18 1045 07/24/18 1055 07/24/18 1105 07/24/18 1116  BP: (!) 96/55 (!) 100/57 (!) 113/58 112/60  Pulse: 61     Resp: Temp:  (!) 97.5 F (36.4 C)  (!) 97.5 F (36.4 C)  TempSrc:      SpO2: 96% 98% 97% 98%  Weight:      Height:         Start Time: 1036 hrs. End Time: 1043 hrs.  Imaging Guidance (Spinal):          Type of Imaging Technique: Fluoroscopy Guidance (Spinal) Indication(s): Assistance in needle guidance and placement for procedures requiring needle placement in or near specific anatomical locations not easily accessible without such assistance. Exposure Time: Please see nurses notes. Contrast: None used. Fluoroscopic Guidance: I was personally present during the use of fluoroscopy. "Tunnel Vision Technique" used to obtain the best possible view of the target area. Parallax error corrected before commencing the procedure. "Direction-depth-direction" technique used to introduce the needle under continuous pulsed fluoroscopy. Once target was reached, antero-posterior, oblique, and lateral fluoroscopic projection used confirm needle placement in all planes. Images permanently stored in EMR. Interpretation: No contrast injected. I personally interpreted the imaging intraoperatively. Adequate needle placement confirmed in multiple planes. Permanent images saved into the patient's record.  Antibiotic Prophylaxis:   Anti-infectives (From  admission, onward)   None     Indication(s): None identified  Post-operative Assessment:  Post-procedure Vital Signs:  Pulse/HCG Rate: 6167 Temp: (!) 97.5 F (36.4 C) Resp: 17 BP: 112/60 SpO2: 98 %  EBL: None  Complications: No immediate post-treatment complications observed by team, or reported by patient.  Note: The patient tolerated the entire procedure well. A repeat set of vitals were taken after the procedure and the patient was kept under observation following institutional policy, for this type of procedure. Post-procedural neurological assessment was performed, showing return to baseline, prior to discharge. The patient was provided with post-procedure discharge instructions, including a section on how to identify potential problems. Should any problems arise concerning this procedure, the patient was given instructions to immediately contact us, at any time, without hesitation. In any case, we plan to contact the patient by telephone for a follow-up status report regarding this interventional procedure.  Comments:  No additional relevant information.  Plan of Care  Orders:  Orders Placed This Encounter  Procedures   LUMBAR FACET(MEDIAL BRANCH NERVE BLOCK) MBNB    Scheduling Instructions:     Side: Left-sided     Level: L1-2, L2-3, & L3-4 Facets (T12, L1, L2, & L3 Medial Branch Nerves)     Sedation: With Sedation.     Timeframe: Today    Order Specific Question:   Where will this procedure be performed?    Answer:   [facility]   DG PAIN [facility] C-ARM 1-60 MIN NO REPORT    Intraoperative interpretation by procedural physician at [facility].    Standing Status:   Standing    Number of Occurrences:   1    Order Specific Question:   Reason for exam:    Answer:   Assistance in needle guidance and placement for procedures requiring needle placement in or near specific anatomical locations not easily accessible without such assistance.   Provider  attestation of informed consent for procedure/surgical  case    I, the ordering provider, attest that I have discussed with the patient the benefits, risks, side effects, alternatives, likelihood of achieving goals and potential problems during recovery for the procedure that I have provided informed consent.    Standing Status:   Standing    Number of Occurrences:   1   Informed Consent Details: Transcribe to consent form and obtain patient signature    Standing Status:   Standing    Number of Occurrences:   1    Order Specific Question:   Procedure    Answer:   Lumbar facet block (medial branch block) under fluoroscopic guidance. (See notes for levels and laterality.)    Order Specific Question:   Surgeon    Answer:   Eliel Dudding A. Laban EmperorNaveira, MD    Order Specific Question:   Indication/Reason    Answer:   Low back pain with or without lower extremity pain.   Medications ordered for procedure: Meds ordered this encounter  Medications   lidocaine (XYLOCAINE) 2 % (with pres) injection 400 mg   lactated ringers infusion 1,000 mL   midazolam (VERSED) 5 MG/5ML injection 1-2 mg    Make sure Flumazenil is available in the pyxis when using this medication. If oversedation occurs, administer 0.2 mg IV over 15 sec. If after 45 sec no response, administer 0.2 mg again over 1 min; may repeat at 1 min intervals; not to exceed 4 doses (1 mg)   fentaNYL (SUBLIMAZE) injection 25-50 mcg    Make sure Narcan is available in the pyxis when using this medication. In the event of respiratory depression (RR< 8/min): Titrate NARCAN (naloxone) in increments of 0.1 to 0.2 mg IV at 2-3 minute intervals, until desired degree of reversal.   ropivacaine (PF) 2 mg/mL (0.2%) (NAROPIN) injection 9 mL   triamcinolone acetonide (KENALOG-40) injection 40 mg   Medications administered: We administered lidocaine, lactated ringers, midazolam, fentaNYL, ropivacaine (PF) 2 mg/mL (0.2%), and triamcinolone acetonide.  See the  medical record for exact dosing, route, and time of administration.  Disposition: Discharge home  Discharge Date & Time: 07/24/2018; 1118 hrs.   Follow-up plan:   Return in about 2 weeks (around 08/07/2018) for (VV), E/M (PP).     Recent Visits No visits were found meeting these conditions.  Showing recent visits within past 90 days and meeting all other requirements   Today's Visits Date Type Provider Dept  07/24/18 Procedure visit Delano MetzNaveira, Azazel Franze, MD [facility]  Showing today's visits and meeting all other requirements   Future Appointments Date Type Provider Dept  08/12/18 Appointment Delano MetzNaveira, Lewayne Pauley, MD [facility]  Showing future appointments within next 90 days and meeting all other requirements   Primary Care Physician: Toy CookeyHeadrick, Emily, FNP [facility] Note by: Oswaldo DoneFrancisco A Terryn Rosenkranz, MD Date: 07/24/2018; Time: 11:57 AM  Disclaimer:  Medicine is not an exact science. The only guarantee in medicine is that nothing is guaranteed. It is important to note that the decision to proceed with this intervention was based on the information collected from the patient. The Data and conclusions were drawn from the patient's questionnaire, the interview, and the physical examination. Because the information was provided in large part by the patient, it cannot be guaranteed that it has not been purposely or unconsciously manipulated. Every effort has been made to obtain as much relevant data as possible for this evaluation. It is important to note that the conclusions that lead to this procedure are  derived in large part from the available data. Always take into account that the treatment will also be dependent on availability of resources and existing treatment guidelines, considered by other Pain Management Practitioners as being common knowledge and practice, at the time of the intervention. For Medico-Legal purposes, it is also  important to point out that variation in procedural techniques and pharmacological choices are the acceptable norm. The indications, contraindications, technique, and results of the above procedure should only be interpreted and judged by a Board-Certified Interventional Pain Specialist with extensive familiarity and expertise in the same exact procedure and technique.

## 2018-07-24 NOTE — Progress Notes (Signed)
Safety precautions to be maintained throughout the outpatient stay will include: orient to surroundings, keep bed in low position, maintain call bell within reach at all times, provide assistance with transfer out of bed and ambulation.  

## 2018-07-24 NOTE — Patient Instructions (Signed)

## 2018-07-25 ENCOUNTER — Telehealth: Payer: Self-pay

## 2018-07-25 NOTE — Telephone Encounter (Signed)
No answer. Left message on AM to call if needed 

## 2018-07-28 ENCOUNTER — Other Ambulatory Visit: Payer: Self-pay | Admitting: Family Medicine

## 2018-07-28 DIAGNOSIS — Z1231 Encounter for screening mammogram for malignant neoplasm of breast: Secondary | ICD-10-CM

## 2018-08-11 ENCOUNTER — Telehealth: Payer: Self-pay

## 2018-08-11 NOTE — Progress Notes (Signed)
Pain Management Virtual Encounter Note - Virtual Visit via Telephone Telehealth (real-time audio visits between healthcare provider and patient).   Patient's Phone No. & Preferred Pharmacy:  [phone] (home); [phone] (mobile); (Preferred) [phone] [email or web address]  [id number] [place], [facility] - [address] [place] [address] Phone: [phone] Fax: [phone]    Pre-screening note:  Our staff contacted [name] and offered her an "in person", "face-to-face" appointment versus a telephone encounter. She indicated preferring the telephone encounter, at this time.   Reason for Virtual Visit: COVID-19*  Social distancing based on CDC and AMA recommendations.   I contacted Angela AusBrenda K Espinoza on 08/12/2018 via telephone.      I clearly identified myself as Oswaldo DoneFrancisco A Arina Torry, MD. I verified that I was speaking with the correct person using two identifiers (Name: Angela Espinoza, and date of birth: [birth year]).  Advanced Informed Consent I sought verbal advanced consent from Angela AusBrenda K Mcswain for virtual visit interactions. I informed [name] of possible security and privacy concerns, risks, and limitations associated with providing "not-in-person" medical evaluation and management services. I also informed [name] of the availability of "in-person" appointments. Finally, I informed her that there would be a charge for the virtual visit and that she could be  personally, fully or partially, financially responsible for it. [name] expressed understanding and agreed to proceed.   Historic Elements   Ms. Angela AusBrenda K Matt is a 63 y.o. year old, female patient evaluated today after her last encounter by our practice on 08/11/2018. [name]  has a past medical history of Anxiety, Asthma, Depression, Hyperlipidemia, Hypertension, and IBS (irritable bowel syndrome). She also  has a past surgical history that  includes Oophorectomy; Exploratory laparotomy; Foot surgery; Abdominal hysterectomy; Colonoscopy; Tonsillectomy; Axillary lymph node biopsy (Right, 08/03/2014); and Kyphoplasty (N/A, 12/10/2014). [name] has a current medication list which includes the following prescription(s): biotin, black elderberry, black pepper-turmeric, cholecalciferol, duloxetine, gabapentin, levocetirizine, lidocaine, losartan, meloxicam, metoprolol succinate, multivitamin, omega-3 fatty acids, and vitamin c. She  reports that she is a non-smoker but has been exposed to tobacco smoke. She has never used smokeless tobacco. She reports current alcohol use. She reports that she does not use drugs. [name] is allergic to indapamide.   HPI  Today, she is being contacted for a post-procedure assessment.  The patient indicates having almost 100% relief of the pain with the left-sided lumbar facet block.  She indicates having had radiofrequency in the past when she was at the other pain clinic.  She indicates that the radiofrequency did "okay".  Today she had a whole bunch of questions regarding modalities such as heat and cold, she also had questions about exercising. I have given her the rule of thumb that she should stop exercising if it is hurting while she is doing the exercise.  I have also mentioned to her that if she hurts after she has completed the exercise, this simply means that she is not used to that level of exercise and in that case, this solution is to continue that regimen until she gets used to it.  I also encouraged the patient to talk to her primary care physician so that she gets treated for osteoporosis.  For now she seems to be doing well and therefore we will plan on doing anything else at this point.  We will continue doing the lumbar facet blocks PRN.  Post-Procedure Evaluation  Procedure (07/24/2018): Diagnostic left-sided lumbar facet T12, L1, L2, & L3  medial branch block #2 under fluoroscopic guidance  and IV sedation Pre-procedure pain level:  8/10 Post-procedure: 0/10 (100% relief)  Sedation: Sedation provided.  Effectiveness during initial hour after procedure(Ultra-Short Term Relief):   100%  Local anesthetic used: Long-acting (4-6 hours) Effectiveness: Defined as any analgesic benefit obtained secondary to the administration of local anesthetics. This carries significant diagnostic value as to the etiological location, or anatomical origin, of the pain. Duration of benefit is expected to coincide with the duration of the local anesthetic used.  Effectiveness during initial 4-6 hours after procedure(Short-Term Relief):   100%  Long-term benefit: Defined as any relief past the pharmacologic duration of the local anesthetics.  Effectiveness past the initial 6 hours after procedure(Long-Term Relief):   100% x 1-2 days.  Current benefits: Defined as benefit that persist at this time.   Analgesia:  90-100% better Function: Ms. Merida reports improvement in function ROM: Ms. Kuechle reports improvement in ROM  Pharmacotherapy Assessment  Analgesic: None MME/day:0 mg/day   Monitoring: Pharmacotherapy: No side-effects or adverse reactions reported. [place] PMP: PDMP reviewed during this encounter.       Compliance: No problems identified. Effectiveness: Clinically acceptable. Plan: Refer to "POC".  Pertinent Labs   SAFETY SCREENING Profile Lab Results  Component Value Date   SARSCOV2NAA NOT DETECTED 07/21/2018   COVIDSOURCE NASOPHARYNGEAL 07/21/2018   Renal Function Lab Results  Component Value Date   BUN 10 01/01/2018   CREATININE 0.88 01/01/2018   BCR 11 (L) 01/01/2018   GFRAA 81 01/01/2018   GFRNONAA 70 01/01/2018   Hepatic Function Lab Results  Component Value Date   AST 26 01/01/2018   ALT 42 08/10/2011   ALBUMIN 4.6 01/01/2018   UDS Summary  Date Value Ref Range Status  12/10/2017 FINAL  Final    Comment:     ==================================================================== TOXASSURE COMP DRUG ANALYSIS,UR ==================================================================== Test                             Result       Flag       Units Drug Present and Declared for Prescription Verification   Cyclobenzaprine                PRESENT      EXPECTED   Amitriptyline                  PRESENT      EXPECTED   Metoprolol                     PRESENT      EXPECTED Drug Absent but Declared for Prescription Verification   Desmethylcyclobenzaprine       Not Detected UNEXPECTED   Duloxetine                     Not Detected UNEXPECTED ==================================================================== Test                      Result    Flag   Units      Ref Range   Creatinine              28               mg/dL      >=20 ==================================================================== Declared Medications:  The flagging and interpretation on this report are based on the  following declared medications.  Unexpected results may arise from  inaccuracies  in the declared medications.  **Note: The testing scope of this panel includes these medications:  Amitriptyline  Cyclobenzaprine  Duloxetine  Metoprolol  **Note: The testing scope of this panel does not include following  reported medications:  Cholecalciferol  Hydrochlorothiazide  Levocetirizine  Losartan (Losartan Potassium)  Magnesium  Meloxicam  Multivitamin  Supplement  Supplement (Omega-3)  Vitamin B (Biotin)  Vitamin C ==================================================================== For clinical consultation, please call [phone]. ====================================================================    Note: Above Lab results reviewed.  Recent imaging  DG C-Arm 1-60 Min-No Report Fluoroscopy was utilized by the requesting physician.  No radiographic  interpretation.   Assessment  The primary encounter diagnosis was  Chronic pain syndrome. Diagnoses of Chronic low back pain (Primary Area of Pain) (Left) w/o sciatica and Lumbar facet joint syndrome (Left) were also pertinent to this visit.  Plan of Care  I have discontinued Scherrie BatemanBrenda K. Bealer's cyclobenzaprine, hydrochlorothiazide, amitriptyline, and Magnesium. I am also having her maintain her vitamin C, Omega-3 Fatty Acids (FISH OIL PO), losartan, metoprolol succinate, multivitamin, levocetirizine, Biotin, Cholecalciferol, Black Pepper-Turmeric, DULoxetine, gabapentin, lidocaine, meloxicam, and BLACK ELDERBERRY PO.  Pharmacotherapy (Medications Ordered): No orders of the defined types were placed in this encounter.  Orders:  No orders of the defined types were placed in this encounter.  Follow-up plan:   Return if symptoms worsen or fail to improve.      Interventional management options: Considering:   Diagnostic left-sided thoracolumbar facet blocksfrom T12-L3 (T12, L1, L2, & L3) #3 Possible left thoracolumbar (T12, L1, L2, & L3) medial branch facet RFA #1  Diagnostic left-sided L1-2LESI #1   Palliative PRN treatment(s):   Diagnostic left thoracolumbar facet medial branch (T12, L1, L2, & L3) block #2 under fluoroscopic guidance and IV sedation    Recent Visits Date Type Provider Dept  07/24/18 Procedure visit Delano MetzNaveira, Bobi Daudelin, MD [facility]  Showing recent visits within past 90 days and meeting all other requirements   Today's Visits Date Type Provider Dept  08/12/18 Office Visit Delano MetzNaveira, Darry Kelnhofer, MD [facility]  Showing today's visits and meeting all other requirements   Future Appointments No visits were found meeting these conditions.  Showing future appointments within next 90 days and meeting all other requirements   I discussed the assessment and treatment plan with the patient. The patient was provided an opportunity to ask questions and all were answered. The patient agreed with the plan and  demonstrated an understanding of the instructions.  Patient advised to call back or seek an in-person evaluation if the symptoms or condition worsens.  Total duration of non-face-to-face encounter: 25 minutes.  Note by: Oswaldo DoneFrancisco A Kember Boch, MD Date: 08/12/2018; Time: 12:57 PM  Note: This dictation was prepared with Dragon dictation. Any transcriptional errors that may result from this process are unintentional.  Disclaimer:  * Given the special circumstances of the COVID-19 pandemic, the federal government has announced that the Office for Civil Rights (OCR) will exercise its enforcement discretion and will not impose penalties on physicians using telehealth in the event of noncompliance with regulatory requirements under the [employer] Insurance Portability and Accountability Act (HIPAA) in connection with the good faith provision of telehealth during the COVID-19 national public health emergency. (AMA)

## 2018-08-11 NOTE — Telephone Encounter (Signed)
Attempted to call patient.  LM on cell phone.  Tried the other number which is a work phone and there was no answer.

## 2018-08-12 ENCOUNTER — Telehealth: Payer: Self-pay

## 2018-08-12 ENCOUNTER — Other Ambulatory Visit: Payer: Self-pay

## 2018-08-12 ENCOUNTER — Ambulatory Visit: Payer: Medicare Other | Attending: Pain Medicine | Admitting: Pain Medicine

## 2018-08-12 ENCOUNTER — Encounter: Payer: Self-pay | Admitting: Pain Medicine

## 2018-08-12 DIAGNOSIS — G8929 Other chronic pain: Secondary | ICD-10-CM

## 2018-08-12 DIAGNOSIS — M47816 Spondylosis without myelopathy or radiculopathy, lumbar region: Secondary | ICD-10-CM

## 2018-08-12 DIAGNOSIS — M545 Low back pain, unspecified: Secondary | ICD-10-CM

## 2018-08-12 DIAGNOSIS — G894 Chronic pain syndrome: Secondary | ICD-10-CM | POA: Diagnosis not present

## 2018-08-12 NOTE — Patient Instructions (Signed)

## 2018-09-11 ENCOUNTER — Encounter: Payer: Self-pay | Admitting: Pain Medicine

## 2018-09-12 ENCOUNTER — Ambulatory Visit
Admission: RE | Admit: 2018-09-12 | Discharge: 2018-09-12 | Disposition: A | Discharge status: Home / Self Care | Payer: Medicare Other | Source: Ambulatory Visit | Attending: Family Medicine | Admitting: Family Medicine

## 2018-09-12 DIAGNOSIS — Z1231 Encounter for screening mammogram for malignant neoplasm of breast: Secondary | ICD-10-CM | POA: Diagnosis present

## 2018-09-14 NOTE — Progress Notes (Signed)
Pain Management Virtual Encounter Note - Virtual Visit via Telephone Telehealth (real-time audio visits between healthcare provider and patient).   Patient's Phone No. & Preferred Pharmacy:  [phone] (home); [phone] (mobile); (Preferred) [phone] [email or web address]  [id number] [facility] [place], [place] [facility] - [address] [place] [address] Phone: [phone] Fax: [phone]    Pre-screening note:  Our staff contacted [name] and offered her an "in person", "face-to-face" appointment versus a telephone encounter. She indicated preferring the telephone encounter, at this time.   Reason for Virtual Visit: COVID-19*  Social distancing based on CDC and AMA recommendations.   I contacted Angela Espinoza on 09/15/2018 via telephone.      I clearly identified myself as Oswaldo DoneFrancisco A Calyse Murcia, MD. I verified that I was speaking with the correct person using two identifiers (Name: Angela Espinoza, and date of birth: Mar 21, 1955).  Advanced Informed Consent I sought verbal advanced consent from Angela Espinoza for virtual visit interactions. I informed Angela Espinoza of possible security and privacy concerns, risks, and limitations associated with providing "not-in-person" medical evaluation and management services. I also informed Angela Espinoza of the availability of "in-person" appointments. Finally, I informed her that there would be a charge for the virtual visit and that she could be  personally, fully or partially, financially responsible for it. Angela Espinoza expressed understanding and agreed to proceed.   Historic Elements   Angela Espinoza is a 63 y.o. year old, female patient evaluated today after her last encounter by our practice on 08/12/2018. Angela Espinoza  has a past medical history of Anxiety, Asthma, Depression, Hyperlipidemia, Hypertension, and IBS (irritable bowel syndrome). She also  has a past surgical history that  includes Oophorectomy; Exploratory laparotomy; Foot surgery; Abdominal hysterectomy; Colonoscopy; Tonsillectomy; Axillary lymph node biopsy (Right, 08/03/2014); and Kyphoplasty (N/A, 12/10/2014). Angela Espinoza has a current medication list which includes the following prescription(s): biotin, black elderberry, black pepper-turmeric, cholecalciferol, duloxetine, gabapentin, levocetirizine, lidocaine, losartan, meloxicam, metoprolol succinate, multivitamin, omega-3 fatty acids, and vitamin c. She  reports that she is a non-smoker but has been exposed to tobacco smoke. She has never used smokeless tobacco. She reports current alcohol use. She reports that she does not use drugs. Angela Espinoza is allergic to indapamide.   HPI  Today, she is being contacted for worsening of previously known (established) problem.  Today I spoke to the patient and she indicated that the benefit from the thoracic facet block is already wearing off and she is interested in pursuing the idea of repeating her thoracic radiofrequency.  She indicates that she has had this done in the past with great success.  At this practice, we have already done 2 diagnostic thoracic facet blocks with 100% relief of the pain that lasted several weeks.  Unfortunately, he keeps returning.  Because of this, I believe it is medically necessary for the patient to undergo radiofrequency ablation for the purpose of extending the benefits obtained with the diagnostic injections.  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 Over-the-counter oral analgesic medications  (i.e.: ibuprofen, naproxen, etc.) 2. Anti-inflammatory medications 3. Muscle relaxants 4. Membrane stabilizers 5. Opioids 6. Physical therapy (PT), chiropractic manipulation,  and/or home exercise program (HEP). 7. Modalities (Heat, ice, etc.) 8. Invasive techniques such as nerve blocks.  Angela Espinoza has attained greater than 50% reduction in pain from at least two (2) diagnostic medial branch blocks conducted in separate occasions. For this reason, I believe it is medically necessary to proceed with Non-Pulsed Radiofrequency Ablation for the purpose of attempting to prolong the duration of the benefits seen with the diagnostic injections.  Pharmacotherapy Assessment  Analgesic: No opioid analgesics from our practice. MME/day:0 mg/day   Monitoring: Pharmacotherapy: No side-effects or adverse reactions reported. [place] PMP: PDMP reviewed during this encounter.       Compliance: No problems identified. Effectiveness: Clinically acceptable. Plan: Refer to "POC".  UDS:  Summary  Date Value Ref Range Status  12/10/2017 FINAL  Final    Comment:    ==================================================================== TOXASSURE COMP DRUG ANALYSIS,UR ==================================================================== Test                             Result       Flag       Units Drug Present and Declared for Prescription Verification   Cyclobenzaprine                PRESENT      EXPECTED   Amitriptyline                  PRESENT      EXPECTED   Metoprolol                     PRESENT      EXPECTED Drug Absent but Declared for Prescription Verification   Desmethylcyclobenzaprine       Not Detected UNEXPECTED   Duloxetine                     Not Detected UNEXPECTED ==================================================================== Test                      Result    Flag   Units      Ref Range   Creatinine              28               mg/dL       >=57>=20 ==================================================================== Declared Medications:  The flagging and interpretation on this report are based on the  following declared medications.  Unexpected results may arise from  inaccuracies in the declared medications.  **Note: The testing scope of this panel includes these medications:  Amitriptyline  Cyclobenzaprine  Duloxetine  Metoprolol  **Note: The testing scope of this panel does not include following  reported medications:  Cholecalciferol  Hydrochlorothiazide  Levocetirizine  Losartan (Losartan Potassium)  Magnesium  Meloxicam  Multivitamin  Supplement  Supplement (Omega-3)  Vitamin B (Biotin)  Vitamin C ==================================================================== For clinical consultation, please call [phone]. ====================================================================    Laboratory Chemistry Profile (12 mo)  Renal: 01/01/2018: BUN 10; BUN/Creatinine Ratio 11; Creatinine, Ser 0.88  Lab Results  Component Value Date   GFRAA 81 01/01/2018   GFRNONAA 70 01/01/2018   Hepatic: 01/01/2018: Albumin 4.6 Lab Results  Component Value Date   AST 26 01/01/2018   ALT 42 08/10/2011   Other: 12/10/2017: CRP 1; Sed Rate 3; Vitamin B-12 1,807 01/01/2018: 25-Hydroxy, Vitamin D 40; 25-Hydroxy, Vitamin D-2 <1.0; 25-Hydroxy, Vitamin D-3 40 Note: Above Lab results reviewed.  Imaging  Last 90 days:  Mm 3d Screen Breast Bilateral  Result Date: 09/12/2018 CLINICAL DATA:  Screening. EXAM: DIGITAL SCREENING BILATERAL MAMMOGRAM WITH TOMO AND CAD COMPARISON:  Previous exam(s). ACR Breast Density Category c: The breast tissue is heterogeneously dense, which may obscure small masses. FINDINGS: In the left breast, a possible asymmetry warrants further evaluation. In the right breast, no findings suspicious for malignancy. Images were processed with CAD. IMPRESSION: Further evaluation is suggested for possible  asymmetry in the left breast. RECOMMENDATION: Diagnostic mammogram and possibly ultrasound of the left breast. (Code:[id number]) The patient will be contacted regarding the findings, and additional imaging will be scheduled. BI-RADS CATEGORY  0: Incomplete. Need additional imaging evaluation and/or prior mammograms for comparison. Electronically Signed   By: Audie Pinto M.D.   On: 09/12/2018 13:06   Assessment  The primary encounter diagnosis was Chronic pain syndrome. Diagnoses of Chronic low back pain (Primary Area of Pain) (Left) w/o sciatica, Lumbar facet joint syndrome (Left), and Traumatic compression fracture of L1 lumbar vertebra, sequela (2016) were also pertinent to this visit.  Plan of Care  I am having Angela Mealor. Cassidy "[employer]" maintain her vitamin C, Omega-3 Fatty Acids (FISH OIL PO), losartan, metoprolol succinate, multivitamin, levocetirizine, Biotin, Cholecalciferol, Black Pepper-Turmeric, DULoxetine, gabapentin, lidocaine, meloxicam, and BLACK ELDERBERRY PO.  Pharmacotherapy (Medications Ordered): No orders of the defined types were placed in this encounter.  Orders:  Orders Placed This Encounter  Procedures  . Radiofrequency,Thoracic    left thoracolumbar (T12, L1, L2, & L3) medial branch facet RFA #1    Standing Status:   Future    Standing Expiration Date:   03/17/2020    Scheduling Instructions:     Side(s): Left-sided     Level(s): T4, T5, T6, T7, T8, T9, T10, T11, Medial Branch Nerve(s)     Sedation: With Sedation     Scheduling Timeframe: As soon as pre-approved    Order Specific Question:   Where will this procedure be performed?    Answer:   [facility]   Follow-up plan:   Return for RFA: (L) T-FCT RFA #1 (T12, L1, L2, & L3).      Interventional management options: Considering:   Possible left thoracolumbar (T12, L1, L2, & L3) medial branch facet RFA #1  Diagnostic left-sided L1-2LESI #1   Palliative PRN treatment(s):    Palliative left thoracolumbar facet medial branch (T12, L1, L2, & L3) block #3     Recent Visits Date Type Provider Dept  08/12/18 Office Visit Milinda Pointer, MD [facility]  07/24/18 Procedure visit Milinda Pointer, MD [facility]  Showing recent visits within past 90 days and meeting all other requirements   Today's Visits Date Type Provider Dept  09/15/18 Office Visit Milinda Pointer, MD [facility]  Showing today's visits and meeting all other requirements   Future Appointments No visits were found meeting these conditions.  Showing future appointments within next 90 days and meeting all other requirements   I discussed the assessment and treatment plan with the patient. The patient was provided an opportunity to ask questions and all were answered. The patient agreed with the plan and demonstrated an understanding of the instructions.  Patient advised to call back or seek an in-person evaluation if the symptoms or condition worsens.  Total duration of non-face-to-face encounter: 10 minutes.  Note by: Gaspar Cola, MD Date: 09/15/2018; Time: 8:29 AM  Note: This dictation was prepared with Dragon dictation. Any transcriptional errors that may result from this process  are unintentional.  Disclaimer:  * Given the special circumstances of the COVID-19 pandemic, the federal government has announced that the Office for Civil Rights (OCR) will exercise its enforcement discretion and will not impose penalties on physicians using telehealth in the event of noncompliance with regulatory requirements under the [place] (HIPAA) in connection with the good faith provision of telehealth during the VPXTG-62 national public health emergency. ([place])

## 2018-09-15 ENCOUNTER — Ambulatory Visit: Payer: Medicare Other | Attending: Pain Medicine | Admitting: Pain Medicine

## 2018-09-15 ENCOUNTER — Other Ambulatory Visit: Payer: Self-pay

## 2018-09-15 DIAGNOSIS — S32010S Wedge compression fracture of first lumbar vertebra, sequela: Secondary | ICD-10-CM | POA: Diagnosis not present

## 2018-09-15 DIAGNOSIS — M47816 Spondylosis without myelopathy or radiculopathy, lumbar region: Secondary | ICD-10-CM | POA: Diagnosis not present

## 2018-09-15 DIAGNOSIS — M545 Low back pain, unspecified: Secondary | ICD-10-CM

## 2018-09-15 DIAGNOSIS — G894 Chronic pain syndrome: Secondary | ICD-10-CM | POA: Diagnosis not present

## 2018-09-15 DIAGNOSIS — G8929 Other chronic pain: Secondary | ICD-10-CM

## 2018-09-15 NOTE — Patient Instructions (Signed)

## 2018-09-18 ENCOUNTER — Other Ambulatory Visit: Payer: Self-pay | Admitting: Family Medicine

## 2018-09-18 DIAGNOSIS — R928 Other abnormal and inconclusive findings on diagnostic imaging of breast: Secondary | ICD-10-CM

## 2018-09-18 DIAGNOSIS — N6489 Other specified disorders of breast: Secondary | ICD-10-CM

## 2018-09-19 ENCOUNTER — Other Ambulatory Visit: Payer: Self-pay | Admitting: Family Medicine

## 2018-09-19 ENCOUNTER — Ambulatory Visit
Admission: RE | Admit: 2018-09-19 | Discharge: 2018-09-19 | Disposition: A | Discharge status: Home / Self Care | Payer: Medicare Other | Source: Ambulatory Visit | Attending: Family Medicine | Admitting: Family Medicine

## 2018-09-19 DIAGNOSIS — R928 Other abnormal and inconclusive findings on diagnostic imaging of breast: Secondary | ICD-10-CM | POA: Diagnosis present

## 2018-09-19 DIAGNOSIS — N6489 Other specified disorders of breast: Secondary | ICD-10-CM | POA: Diagnosis present

## 2018-09-29 ENCOUNTER — Other Ambulatory Visit: Payer: Self-pay | Admitting: Family Medicine

## 2018-09-29 DIAGNOSIS — R928 Other abnormal and inconclusive findings on diagnostic imaging of breast: Secondary | ICD-10-CM

## 2018-09-30 ENCOUNTER — Ambulatory Visit: Payer: Medicare Other | Admitting: Pain Medicine

## 2018-09-30 ENCOUNTER — Other Ambulatory Visit: Payer: Self-pay

## 2018-09-30 ENCOUNTER — Encounter: Payer: Self-pay | Admitting: Pain Medicine

## 2018-09-30 ENCOUNTER — Ambulatory Visit
Admission: RE | Admit: 2018-09-30 | Discharge: 2018-09-30 | Disposition: A | Discharge status: Home / Self Care | Payer: Medicare Other | Source: Ambulatory Visit | Attending: Pain Medicine | Admitting: Pain Medicine

## 2018-09-30 ENCOUNTER — Ambulatory Visit (HOSPITAL_BASED_OUTPATIENT_CLINIC_OR_DEPARTMENT_OTHER): Payer: Medicare Other | Admitting: Pain Medicine

## 2018-09-30 VITALS — BP 113/81 | HR 59 | Temp 98.3°F | Resp 18 | Ht 62.0 in | Wt 145.0 lb

## 2018-09-30 DIAGNOSIS — M47816 Spondylosis without myelopathy or radiculopathy, lumbar region: Secondary | ICD-10-CM | POA: Diagnosis present

## 2018-09-30 DIAGNOSIS — M47814 Spondylosis without myelopathy or radiculopathy, thoracic region: Secondary | ICD-10-CM | POA: Insufficient documentation

## 2018-09-30 DIAGNOSIS — M5135 Other intervertebral disc degeneration, thoracolumbar region: Secondary | ICD-10-CM | POA: Diagnosis not present

## 2018-09-30 DIAGNOSIS — M545 Low back pain, unspecified: Secondary | ICD-10-CM

## 2018-09-30 DIAGNOSIS — G8918 Other acute postprocedural pain: Secondary | ICD-10-CM | POA: Insufficient documentation

## 2018-09-30 DIAGNOSIS — S32010S Wedge compression fracture of first lumbar vertebra, sequela: Secondary | ICD-10-CM | POA: Diagnosis present

## 2018-09-30 DIAGNOSIS — M47817 Spondylosis without myelopathy or radiculopathy, lumbosacral region: Secondary | ICD-10-CM

## 2018-09-30 DIAGNOSIS — M47894 Other spondylosis, thoracic region: Secondary | ICD-10-CM | POA: Insufficient documentation

## 2018-09-30 DIAGNOSIS — G8929 Other chronic pain: Secondary | ICD-10-CM

## 2018-09-30 MED ORDER — HYDROCODONE-ACETAMINOPHEN 5-325 MG PO TABS: 1.0000 | ORAL_TABLET | Freq: Four times a day (QID) | ORAL | 0 refills | Status: AC | PRN

## 2018-09-30 MED ORDER — FENTANYL CITRATE (PF) 100 MCG/2ML IJ SOLN
25.0000 ug | INTRAMUSCULAR | Status: DC | PRN
  Administered 2018-09-30: 12:00:00 100 ug via INTRAVENOUS
  Filled 2018-09-30: qty 2

## 2018-09-30 MED ORDER — ROPIVACAINE HCL 2 MG/ML IJ SOLN
9.0000 mL | Freq: Once | INTRAMUSCULAR | Status: AC
  Administered 2018-09-30: 9 mL via PERINEURAL
  Filled 2018-09-30: qty 10

## 2018-09-30 MED ORDER — TRIAMCINOLONE ACETONIDE 40 MG/ML IJ SUSP
40.0000 mg | Freq: Once | INTRAMUSCULAR | Status: AC
  Administered 2018-09-30: 40 mg
  Filled 2018-09-30: qty 1

## 2018-09-30 MED ORDER — LIDOCAINE HCL 2 % IJ SOLN
20.0000 mL | Freq: Once | INTRAMUSCULAR | Status: AC
  Administered 2018-09-30: 400 mg

## 2018-09-30 MED ORDER — MIDAZOLAM HCL 5 MG/5ML IJ SOLN
1.0000 mg | INTRAMUSCULAR | Status: DC | PRN
  Administered 2018-09-30: 3 mg via INTRAVENOUS
  Filled 2018-09-30: qty 5

## 2018-09-30 MED ORDER — LACTATED RINGERS IV SOLN: 1000.0000 mL | Freq: Once | INTRAVENOUS | Status: DC

## 2018-09-30 NOTE — Progress Notes (Signed)
Patient's Name: Angela Espinoza  MRN: [id number]  Referring Provider: Toy CookeyHeadrick, Emily, FNP  DOB: [birth year]  PCP: Toy CookeyHeadrick, Emily, FNP  DOS: 09/30/2018  Note by: Oswaldo DoneFrancisco A Hermann Dottavio, MD  Service setting: Ambulatory outpatient  Specialty: Interventional Pain Management  Patient type: Established  Location: [facility] (AMB) [facility] Facility  Visit type: Interventional Procedure   Primary Reason for Visit: Interventional Pain Management Treatment. CC: Back Pain (mid to lower left side )  Procedure:          Anesthesia, Analgesia, Anxiolysis:  Type: Thermal Thoracolumbar Facet, Medial Branch Radiofrequency Ablation/Neurotomy  #1  Primary Purpose: Therapeutic Region: Posterolateral Lumbosacral Spine Level: T12, L1, L2, & L3 Medial Branch Level(s). Injecting these levels blocks the L1-2, L2-3, and L3-4 lumbar facet joints. Laterality: Left  Type: Moderate (Conscious) Sedation combined with Local Anesthesia Indication(s): Analgesia and Anxiety Route: Intravenous (IV) IV Access: Secured Sedation: Meaningful verbal contact was maintained at all times during the procedure  Local Anesthetic: Lidocaine 1-2%  Position: Prone   Indications: 1. Lumbar facet joint syndrome (Left)   2. Thoracic facet syndrome (Left)   3. Spondylosis without myelopathy or radiculopathy, lumbosacral region   4. Spondylosis without myelopathy or radiculopathy, thoracic region   5. DDD (degenerative disc disease), thoracolumbar   6. Traumatic compression fracture of L1 lumbar vertebra, sequela (2016)   7. Chronic low back pain (Primary Area of Pain) (Left) w/o sciatica    Angela Espinoza has been dealing with the above chronic pain for longer than three months and has either failed to respond, was unable to tolerate, or simply did not get enough benefit from other more conservative therapies including, but not limited to: 1. Over-the-counter medications 2. Anti-inflammatory medications 3. Muscle relaxants 4. Membrane  stabilizers 5. Opioids 6. Physical therapy and/or chiropractic manipulation 7. Modalities (Heat, ice, etc.) 8. Invasive techniques such as nerve blocks. Angela Espinoza has attained more than 50% relief of the pain from a series of diagnostic injections conducted in separate occasions.  Pain Score: Pre-procedure: 8 /10 Post-procedure: 0-No pain/10  Pre-op Assessment:  Angela Espinoza is a 63 y.o. (year old), female patient, seen today for interventional treatment. She  has a past surgical history that includes Oophorectomy; Exploratory laparotomy; Foot surgery; Abdominal hysterectomy; Colonoscopy; Tonsillectomy; Axillary lymph node biopsy (Right, 08/03/2014); and Kyphoplasty (N/A, 12/10/2014). Angela Espinoza has a current medication list which includes the following prescription(s): biotin, black elderberry, black pepper-turmeric, calcium carbonate-vitamin d, cholecalciferol, duloxetine, gabapentin, levocetirizine, lidocaine, losartan, magnesium, meloxicam, metoprolol succinate, multivitamin, omega-3 fatty acids, vitamin c, hydrocodone-acetaminophen, and hydrocodone-acetaminophen, and the following Facility-Administered Medications: fentanyl, lactated ringers, and midazolam. Her primarily concern today is the Back Pain (mid to lower left side )  Initial Vital Signs:  Pulse/HCG Rate: (!) 59ECG Heart Rate: 64 Temp: 98.8 F (37.1 C) Resp: 16 BP: (!) 103/50 SpO2: 99 %  BMI: Estimated body mass index is 26.52 kg/m as calculated from the following:   Height as of this encounter: 5\' 2"  (1.575 m).   Weight as of this encounter: 145 lb (65.8 kg).  Risk Assessment: Allergies: Reviewed. She is allergic to indapamide.  Allergy Precautions: None required Coagulopathies: Reviewed. None identified.  Blood-thinner therapy: None at this time Active Infection(s): Reviewed. None identified. Angela Espinoza is afebrile  Site Confirmation: Angela Espinoza was asked to confirm the procedure and laterality before  marking the site Procedure checklist: Completed Consent: Before the procedure and under the influence of no sedative(s), amnesic(s), or anxiolytics, the patient was informed of the treatment options, risks  and possible complications. To fulfill our ethical and legal obligations, as recommended by the American Medical Association's Code of Ethics, I have informed the patient of my clinical impression; the nature and purpose of the treatment or procedure; the risks, benefits, and possible complications of the intervention; the alternatives, including doing nothing; the risk(s) and benefit(s) of the alternative treatment(s) or procedure(s); and the risk(s) and benefit(s) of doing nothing. The patient was provided information about the general risks and possible complications associated with the procedure. These may include, but are not limited to: failure to achieve desired goals, infection, bleeding, organ or nerve damage, allergic reactions, paralysis, and death. In addition, the patient was informed of those risks and complications associated to Spine-related procedures, such as failure to decrease pain; infection (i.e.: Meningitis, epidural or intraspinal abscess); bleeding (i.e.: epidural hematoma, subarachnoid hemorrhage, or any other type of intraspinal or peri-dural bleeding); organ or nerve damage (i.e.: Any type of peripheral nerve, nerve root, or spinal cord injury) with subsequent damage to sensory, motor, and/or autonomic systems, resulting in permanent pain, numbness, and/or weakness of one or several areas of the body; allergic reactions; (i.e.: anaphylactic reaction); and/or death. Furthermore, the patient was informed of those risks and complications associated with the medications. These include, but are not limited to: allergic reactions (i.e.: anaphylactic or anaphylactoid reaction(s)); adrenal axis suppression; blood sugar elevation that in diabetics may result in ketoacidosis or comma; water  retention that in patients with history of congestive heart failure may result in shortness of breath, pulmonary edema, and decompensation with resultant heart failure; weight gain; swelling or edema; medication-induced neural toxicity; particulate matter embolism and blood vessel occlusion with resultant organ, and/or nervous system infarction; and/or aseptic necrosis of one or more joints. Finally, the patient was informed that Medicine is not an exact science; therefore, there is also the possibility of unforeseen or unpredictable risks and/or possible complications that may result in a catastrophic outcome. The patient indicated having understood very clearly. We have given the patient no guarantees and we have made no promises. Enough time was given to the patient to ask questions, all of which were answered to the patient's satisfaction. Ms. Hennick has indicated that she wanted to continue with the procedure. Attestation: I, the ordering provider, attest that I have discussed with the patient the benefits, risks, side-effects, alternatives, likelihood of achieving goals, and potential problems during recovery for the procedure that I have provided informed consent. Date   Time: 09/30/2018 11:22 AM  Pre-Procedure Preparation:  Monitoring: As per clinic protocol. Respiration, ETCO2, SpO2, BP, heart rate and rhythm monitor placed and checked for adequate function Safety Precautions: Patient was assessed for positional comfort and pressure points before starting the procedure. Time-out: I initiated and conducted the "Time-out" before starting the procedure, as per protocol. The patient was asked to participate by confirming the accuracy of the "Time Out" information. Verification of the correct person, site, and procedure were performed and confirmed by me, the nursing staff, and the patient. "Time-out" conducted as per Joint Commission's Universal Protocol (UP.01.01.01). Time: 1154  Description of  Procedure:          Laterality: Left Levels:  T12, L1, L2, & L3 Medial Branch Level(s). Injecting these levels blocks the L1-2, L2-3, and L3-4 lumbar facet joints. Area Prepped: Lumbosacral Prepping solution: DuraPrep (Iodine Povacrylex [0.7% available iodine] and Isopropyl Alcohol, 74% w/w) Safety Precautions: Aspiration looking for blood return was conducted prior to all injections. At no point did we inject any substances,  as a needle was being advanced. Before injecting, the patient was told to immediately notify me if she was experiencing any new onset of "ringing in the ears, or metallic taste in the mouth". No attempts were made at seeking any paresthesias. Safe injection practices and needle disposal techniques used. Medications properly checked for expiration dates. SDV (single dose vial) medications used. After the completion of the procedure, all disposable equipment used was discarded in the proper designated medical waste containers. Local Anesthesia: Protocol guidelines were followed. The patient was positioned over the fluoroscopy table. The area was prepped in the usual manner. The time-out was completed. The target area was identified using fluoroscopy. A 12-in long, straight, sterile hemostat was used with fluoroscopic guidance to locate the targets for each level blocked. Once located, the skin was marked with an approved surgical skin marker. Once all sites were marked, the skin (epidermis, dermis, and hypodermis), as well as deeper tissues (fat, connective tissue and muscle) were infiltrated with a small amount of a short-acting local anesthetic, loaded on a 10cc syringe with a 25G, 1.5-in  Needle. An appropriate amount of time was allowed for local anesthetics to take effect before proceeding to the next step. Local Anesthetic: Lidocaine 2.0% The unused portion of the local anesthetic was discarded in the proper designated containers. Technical explanation of process:  Radiofrequency  Ablation (RFA) T12 Medial Branch Nerve RFA: The target area for the T12 medial branch is at the junction of the postero-lateral aspect of the superior articular process and the superior, posterior, and medial edge of the transverse process of L1. Under fluoroscopic guidance, a Radiofrequency needle was inserted until contact was made with os over the superior postero-lateral aspect of the pedicular shadow (target area). Sensory and motor testing was conducted to properly adjust the position of the needle. Once satisfactory placement of the needle was achieved, the numbing solution was slowly injected after negative aspiration for blood. 2.0 mL of the nerve block solution was injected without difficulty or complication. After waiting for at least 3 minutes, the ablation was performed. Once completed, the needle was removed intact. L1 Medial Branch Nerve RFA: The target area for the L1 medial branch is at the junction of the postero-lateral aspect of the superior articular process and the superior, posterior, and medial edge of the transverse process of L2. Under fluoroscopic guidance, a Radiofrequency needle was inserted until contact was made with os over the superior postero-lateral aspect of the pedicular shadow (target area). Sensory and motor testing was conducted to properly adjust the position of the needle. Once satisfactory placement of the needle was achieved, the numbing solution was slowly injected after negative aspiration for blood. 2.0 mL of the nerve block solution was injected without difficulty or complication. After waiting for at least 3 minutes, the ablation was performed. Once completed, the needle was removed intact. L2 Medial Branch Nerve RFA: The target area for the L2 medial branch is at the junction of the postero-lateral aspect of the superior articular process and the superior, posterior, and medial edge of the transverse process of L3. Under fluoroscopic guidance, a Radiofrequency  needle was inserted until contact was made with os over the superior postero-lateral aspect of the pedicular shadow (target area). Sensory and motor testing was conducted to properly adjust the position of the needle. Once satisfactory placement of the needle was achieved, the numbing solution was slowly injected after negative aspiration for blood. 2.0 mL of the nerve block solution was injected without difficulty or  complication. After waiting for at least 3 minutes, the ablation was performed. Once completed, the needle was removed intact. L3 Medial Branch Nerve RFA: The target area for the L3 medial branch is at the junction of the postero-lateral aspect of the superior articular process and the superior, posterior, and medial edge of the transverse process of L4. Under fluoroscopic guidance, a Radiofrequency needle was inserted until contact was made with os over the superior postero-lateral aspect of the pedicular shadow (target area). Sensory and motor testing was conducted to properly adjust the position of the needle. Once satisfactory placement of the needle was achieved, the numbing solution was slowly injected after negative aspiration for blood. 2.0 mL of the nerve block solution was injected without difficulty or complication. After waiting for at least 3 minutes, the ablation was performed. Once completed, the needle was removed intact.  Radiofrequency lesioning (ablation):  Radiofrequency Generator: NeuroTherm [id number] Sensory Stimulation Parameters: 50 Hz was used to locate & identify the nerve, making sure that the needle was positioned such that there was no sensory stimulation below 0.3 V or above 0.7 V. Motor Stimulation Parameters: 2 Hz was used to evaluate the motor component. Care was taken not to lesion any nerves that demonstrated motor stimulation of the lower extremities at an output of less than 2.5 times that of the sensory threshold, or a maximum of 2.0 V. Lesioning Technique  Parameters: Standard Radiofrequency settings. (Not bipolar or pulsed.) Temperature Settings: 80 degrees C Lesioning time: 60 seconds Intra-operative Compliance: Compliant Materials & Medications: Needle(s) (Electrode/Cannula) Type: Teflon-coated, curved tip, Radiofrequency needle(s) Gauge: 22G Length: 10cm Numbing solution: 0.2% PF-Ropivacaine + Triamcinolone (40 mg/mL) diluted to a final concentration of 4 mg of Triamcinolone/mL of Ropivacaine The unused portion of the solution was discarded in the proper designated containers.  Once the entire procedure was completed, the treated area was cleaned, making sure to leave some of the prepping solution back to take advantage of its long term bactericidal properties.  Illustration of the posterior view of the lumbar spine and the posterior neural structures. Laminae of L2 through S1 are labeled. DPRL5, dorsal primary ramus of L5; DPRS1, dorsal primary ramus of S1; DPR3, dorsal primary ramus of L3; FJ, facet (zygapophyseal) joint L3-L4; I, inferior articular process of L4; LB1, lateral branch of dorsal primary ramus of L1; IAB, inferior articular branches from L3 medial branch (supplies L4-L5 facet joint); IBP, intermediate branch plexus; MB3, medial branch of dorsal primary ramus of L3; NR3, third lumbar nerve root; S, superior articular process of L5; SAB, superior articular branches from L4 (supplies L4-5 facet joint also); TP3, transverse process of L3.  Vitals:   09/30/18 1220 09/30/18 1230 09/30/18 1240 09/30/18 1249  BP: (!) 107/52 (!) 125/53 108/83 113/81  Pulse:      Resp: 11 14 20 18   Temp:  98.2 F (36.8 C)  98.3 F (36.8 C)  TempSrc:      SpO2: 100% 100% 100% 100%  Weight:      Height:        Start Time: 1154 hrs. End Time: 1220 hrs.  Imaging Guidance (Spinal):          Type of Imaging Technique: Fluoroscopy Guidance (Spinal) Indication(s): Assistance in needle guidance and placement for procedures requiring needle placement  in or near specific anatomical locations not easily accessible without such assistance. Exposure Time: Please see nurses notes. Contrast: None used. Fluoroscopic Guidance: I was personally present during the use of fluoroscopy. "Tunnel Vision Technique" used to  obtain the best possible view of the target area. Parallax error corrected before commencing the procedure. "Direction-depth-direction" technique used to introduce the needle under continuous pulsed fluoroscopy. Once target was reached, antero-posterior, oblique, and lateral fluoroscopic projection used confirm needle placement in all planes. Images permanently stored in EMR. Interpretation: No contrast injected. I personally interpreted the imaging intraoperatively. Adequate needle placement confirmed in multiple planes. Permanent images saved into the patient's record.  Antibiotic Prophylaxis:   Anti-infectives (From admission, onward)   None     Indication(s): None identified  Post-operative Assessment:  Post-procedure Vital Signs:  Pulse/HCG Rate: (!) 5972 Temp: 98.3 F (36.8 C) Resp: 18 BP: 113/81 SpO2: 100 %  EBL: None  Complications: No immediate post-treatment complications observed by team, or reported by patient.  Note: The patient tolerated the entire procedure well. A repeat set of vitals were taken after the procedure and the patient was kept under observation following institutional policy, for this type of procedure. Post-procedural neurological assessment was performed, showing return to baseline, prior to discharge. The patient was provided with post-procedure discharge instructions, including a section on how to identify potential problems. Should any problems arise concerning this procedure, the patient was given instructions to immediately contact us, at any time, without hesitation. In any case, we plan to contact the patient by telephone for a follow-up status report regarding this interventional  procedure.  Comments:  No additional relevant information.  Plan of Care  Orders:  Orders Placed This Encounter  Procedures   Radiofrequency,Lumbar    Scheduling Instructions:     Side(s): Left-sided     Level: L1, L2, & L3 Medial Branch Level(s). Injecting these levels blocks the L1-2, L2-3, and L3-4 lumbar facet joints.     Sedation: With Sedation     Timeframe: Today    Order Specific Question:   Where will this procedure be performed?    Answer:   [facility]   Radiofrequency,Thoracic    Scheduling Instructions:     Side(s): Left-sided     Level(s): T12 Medial Branch Nerve     Sedation: With Sedation     Timeframe: Today    Order Specific Question:   Where will this procedure be performed?    Answer:   [facility]   DG PAIN CLINIC C-ARM 1-60 MIN NO REPORT    Intraoperative interpretation by procedural physician at [facility].    Standing Status:   Standing    Number of Occurrences:   1    Order Specific Question:   Reason for exam:    Answer:   Assistance in needle guidance and placement for procedures requiring needle placement in or near specific anatomical locations not easily accessible without such assistance.   Provider attestation of informed consent for procedure/surgical case    I, the ordering provider, attest that I have discussed with the patient the benefits, risks, side effects, alternatives, likelihood of achieving goals and potential problems during recovery for the procedure that I have provided informed consent.    Standing Status:   Standing    Number of Occurrences:   1   Informed Consent Details: Transcribe to consent form and obtain patient signature    Consent Attestation: I, the ordering provider, attest that I have discussed with the patient the benefits, risks, side-effects, alternatives, likelihood of achieving goals, and potential problems during recovery for the procedure that I have provided informed consent.     Standing Status:   Standing  Number of Occurrences:   1    Order Specific Question:   Procedure    Answer:   Thoracic facet medial branch RFA under fluoroscopic guidance. (See notes for level and laterality.)    Order Specific Question:   Surgeon    Answer:   Laynee Lockamy A. Laban Emperor, MD    Order Specific Question:   Indication/Reason    Answer:   Chronic thoracic pain secondary to thoracic facet syndrome   Informed Consent Details: Transcribe to consent form and obtain patient signature    Consent Attestation: I, the ordering provider, attest that I have discussed with the patient the benefits, risks, side-effects, alternatives, likelihood of achieving goals, and potential problems during recovery for the procedure that I have provided informed consent.    Standing Status:   Standing    Number of Occurrences:   1    Order Specific Question:   Procedure    Answer:   Left Lumbar facet medial branch RFA under fluoroscopic guidance.    Order Specific Question:   Surgeon    Answer:   Sydnee Levans. Laban Emperor, MD    Order Specific Question:   Indication/Reason    Answer:   Chronic low back pain secondary to lumbar facet syndrome   Chronic Opioid Analgesic:  No opioid analgesics from our practice. MME/day:0 mg/day   Medications ordered for procedure: Meds ordered this encounter  Medications   lidocaine (XYLOCAINE) 2 % (with pres) injection 400 mg   lactated ringers infusion 1,000 mL   midazolam (VERSED) 5 MG/5ML injection 1-2 mg    Make sure Flumazenil is available in the pyxis when using this medication. If oversedation occurs, administer 0.2 mg IV over 15 sec. If after 45 sec no response, administer 0.2 mg again over 1 min; may repeat at 1 min intervals; not to exceed 4 doses (1 mg)   fentaNYL (SUBLIMAZE) injection 25-50 mcg    Make sure Narcan is available in the pyxis when using this medication. In the event of respiratory depression (RR< 8/min): Titrate NARCAN (naloxone) in increments  of 0.1 to 0.2 mg IV at 2-3 minute intervals, until desired degree of reversal.   ropivacaine (PF) 2 mg/mL (0.2%) (NAROPIN) injection 9 mL   triamcinolone acetonide (KENALOG-40) injection 40 mg   HYDROcodone-acetaminophen (NORCO/VICODIN) 5-325 MG tablet    Sig: Take 1 tablet by mouth every 6 (six) hours as needed for up to 7 days for severe pain. Must last 7 days.    Dispense:  28 tablet    Refill:  0    For acute post-operative pain. Not to be refilled. Must last 7 days.   HYDROcodone-acetaminophen (NORCO/VICODIN) 5-325 MG tablet    Sig: Take 1 tablet by mouth every 6 (six) hours as needed for up to 7 days for severe pain. Must last 7 days.    Dispense:  28 tablet    Refill:  0    For acute post-operative pain. Not to be refilled.  Must last 7 days.   Medications administered: We administered lidocaine, midazolam, fentaNYL, ropivacaine (PF) 2 mg/mL (0.2%), and triamcinolone acetonide.  See the medical record for exact dosing, route, and time of administration.  Follow-up plan:   Return in about 6 weeks (around 11/11/2018) for (VV), (PP).       Interventional management options: Considering:   Diagnostic left-sided L1-2LESI #1   Palliative PRN treatment(s):   Palliative left thoracolumbar facet medial branch (T12, L1, L2, & L3) block #3  Palliative left thoracolumbar (T12, L1,  L2, & L3) medial branch facet RFA #2 (last done 09/30/2018)     Recent Visits Date Type Provider Dept  09/15/18 Office Visit Delano Metz, MD [facility]  08/12/18 Office Visit Delano Metz, MD [facility]  07/24/18 Procedure visit Delano Metz, MD [facility]  Showing recent visits within past 90 days and meeting all other requirements   Today's Visits Date Type Provider Dept  09/30/18 Procedure visit Delano Metz, MD [facility]  Showing today's visits and meeting all other requirements   Future Appointments Date Type Provider Dept   11/12/18 Appointment Edward Jolly, MD [facility]  Showing future appointments within next 90 days and meeting all other requirements   Disposition: Discharge home  Discharge Date & Time: 09/30/2018; 1250 hrs.   Primary Care Physician: Toy Cookey, FNP Location: [facility] Note by: Oswaldo Done, MD Date: 09/30/2018; Time: 12:54 PM  Disclaimer:  Medicine is not an Visual merchandiser. The only guarantee in medicine is that nothing is guaranteed. It is important to note that the decision to proceed with this intervention was based on the information collected from the patient. The Data and conclusions were drawn from the patient's questionnaire, the interview, and the physical examination. Because the information was provided in large part by the patient, it cannot be guaranteed that it has not been purposely or unconsciously manipulated. Every effort has been made to obtain as much relevant data as possible for this evaluation. It is important to note that the conclusions that lead to this procedure are derived in large part from the available data. Always take into account that the treatment will also be dependent on availability of resources and existing treatment guidelines, considered by other Pain Management Practitioners as being common knowledge and practice, at the time of the intervention. For Medico-Legal purposes, it is also important to point out that variation in procedural techniques and pharmacological choices are the acceptable norm. The indications, contraindications, technique, and results of the above procedure should only be interpreted and judged by a Board-Certified Interventional Pain Specialist with extensive familiarity and expertise in the same exact procedure and technique.

## 2018-09-30 NOTE — Patient Instructions (Signed)

## 2018-09-30 NOTE — Progress Notes (Signed)
Safety precautions to be maintained throughout the outpatient stay will include: orient to surroundings, keep bed in low position, maintain call bell within reach at all times, provide assistance with transfer out of bed and ambulation.  

## 2018-10-01 ENCOUNTER — Telehealth: Payer: Self-pay

## 2018-10-01 NOTE — Telephone Encounter (Signed)
Post procedure phone call.  LM 

## 2018-10-07 ENCOUNTER — Ambulatory Visit
Admission: RE | Admit: 2018-10-07 | Discharge: 2018-10-07 | Disposition: A | Discharge status: Home / Self Care | Payer: Medicare Other | Source: Ambulatory Visit | Attending: Family Medicine | Admitting: Family Medicine

## 2018-10-07 DIAGNOSIS — R928 Other abnormal and inconclusive findings on diagnostic imaging of breast: Secondary | ICD-10-CM | POA: Insufficient documentation

## 2018-10-07 HISTORY — PX: BREAST BIOPSY: SHX20

## 2018-10-08 LAB — SURGICAL PATHOLOGY

## 2018-10-09 ENCOUNTER — Telehealth: Payer: Self-pay | Admitting: Student in an Organized Health Care Education/Training Program

## 2018-10-09 NOTE — Telephone Encounter (Signed)
Patient had a RF on 9-1 and was given script of Hydrocodone. Only has a few left and wants to know if she can get a few more to help with pain

## 2018-10-09 NOTE — Telephone Encounter (Signed)
No more Hydrocodone will be called in. This was only for post RF pain. She will need an appointment if she is still having pain that is requiring pain medication at this point. Patient understands instructions and will call back on Monday if still in pain.

## 2018-10-13 ENCOUNTER — Telehealth: Payer: Self-pay | Admitting: Student in an Organized Health Care Education/Training Program

## 2018-10-13 NOTE — Telephone Encounter (Signed)
Patient called stating she didn't get any relief over the weekend and was told to call back on Monday. Please call patient and advise what to do about pain.

## 2018-10-13 NOTE — Telephone Encounter (Signed)
Please schedule for an appointment per Dr Elmon Else previous note.

## 2018-10-13 NOTE — Telephone Encounter (Signed)
Spoke with patient, gave her an appt. 10-30-18 at 2:45 is first available at this time. Patient is going to beach this week with her sister. She does not understand why Dr. Holley Raring will not call in anymore medications. She has appt with PCP ON 10-28-18 and will discuss this with them.

## 2018-10-14 ENCOUNTER — Telehealth: Payer: Self-pay | Admitting: Pain Medicine

## 2018-10-14 ENCOUNTER — Telehealth: Payer: Self-pay | Admitting: Student in an Organized Health Care Education/Training Program

## 2018-10-14 NOTE — Telephone Encounter (Signed)
Entered incorrect physician for this patient

## 2018-10-14 NOTE — Telephone Encounter (Signed)
Patient had RF cpl weeks ago. She is still having substantial amount of pain. Was asking if she could get another week of pain meds or if not please call and let her know what she can do for pain, what can she take over the counter? She is leaving to go to the [employer]. Please call today.

## 2018-10-14 NOTE — Telephone Encounter (Signed)
This was routed to Dr. Holley Raring previously. My error.

## 2018-10-14 NOTE — Telephone Encounter (Signed)
error 

## 2018-10-20 NOTE — Telephone Encounter (Signed)
Dr. Holley Raring, My apologies. I assigned wrong doctor. I caught this and sent to Dr. Dossie Arbour.

## 2018-10-30 ENCOUNTER — Encounter: Payer: Self-pay | Admitting: Pain Medicine

## 2018-10-30 ENCOUNTER — Ambulatory Visit: Payer: Medicare Other | Admitting: Pain Medicine

## 2018-10-30 ENCOUNTER — Telehealth: Payer: Self-pay

## 2018-11-02 NOTE — Progress Notes (Signed)
Pain Management Virtual Encounter Note - Virtual Visit via Telephone Telehealth (real-time audio visits between healthcare provider and patient).   Patient's Phone No. & Preferred Pharmacy:  [phone] (home); [phone] (mobile); (Preferred) [phone] [email or web address]  [id number] [place], [facility] - [address] [place] [address] Phone: [phone] Fax: [phone]    Pre-screening note:  Our staff contacted Angela Espinoza and offered her an "in person", "face-to-face" appointment versus a telephone encounter. She indicated preferring the telephone encounter, at this time.   Reason for Virtual Visit: COVID-19*  Social distancing based on CDC and AMA recommendations.   I contacted Angela Espinoza on 11/03/2018 via telephone.      I clearly identified myself as Oswaldo Done, MD. I verified that I was speaking with the correct person using two identifiers (Name: Angela Espinoza, and date of birth: August 03, 1955). ` Advanced Informed Consent I sought verbal advanced consent from Angela Espinoza for virtual visit interactions. I informed Angela Espinoza of possible security and privacy concerns, risks, and limitations associated with providing "not-in-person" medical evaluation and management services. I also informed Angela Espinoza of the availability of "in-person" appointments. Finally, I informed her that there would be a charge for the virtual visit and that she could be  personally, fully or partially, financially responsible for it. Ms. Sharrar expressed understanding and agreed to proceed.   Historic Elements   Angela Espinoza is a 63 y.o. year old, female patient evaluated today after her last encounter by our practice on 10/30/2018. Angela Espinoza  has a past medical history of Anxiety, Asthma, Depression, Hyperlipidemia, Hypertension, and IBS (irritable bowel syndrome). She also  has a past surgical history that  includes Oophorectomy; Exploratory laparotomy; Foot surgery; Abdominal hysterectomy; Colonoscopy; Tonsillectomy; Axillary lymph node biopsy (Right, 08/03/2014); Kyphoplasty (N/A, 12/10/2014); and Breast biopsy (Left, 10/07/2018). [name] has a current medication list which includes the following prescription(s): biotin, black elderberry, black pepper-turmeric, calcium carbonate-vitamin d, cholecalciferol, duloxetine, gabapentin, levocetirizine, lidocaine, losartan, magnesium, meloxicam, metoprolol succinate, multivitamin, omega-3 fatty acids, vitamin c, and gabapentin. She  reports that she is a non-smoker but has been exposed to tobacco smoke. She has never used smokeless tobacco. She reports current alcohol use. She reports that she does not use drugs. Angela Espinoza is allergic to indapamide.   HPI  Today, she is being contacted for a post-procedure assessment.  The patient indicates that she is doing better, but the first 4 weeks were rough.  To assist with this, I have looked at her current medication regimen and I have offered to take over her gabapentin.  She is currently taking 200 mg 3 times a day using the 100 mg pills.  Today I have provided the patient with some instructions on how to continue titrating the medication up to the point where we either get some relief of the pain or we encounter some side effects that would prevent this from continuing to increase it.  I have provided her with instructions on how to do this in her "after visit summary".  Today I will send a prescription to the pharmacy for gabapentin, just in case she runs out of her usual prescription.  I have instructed her to slowly increase it to where our initial goal will be 300 mg 4 times a day.  Once we reached this level then I will switch her to 300 mg pills.  I will talk to her again in about a month to see how she is  doing and to continue getting her with regards to this titration.  Today's encounter took a while since  she had a lot of questions regarding the gabapentin titration.  Post-Procedure Evaluation  Procedure: Therapeutic Left T12-L3 Thoracolumbar Facet, Medial Branch RFA  #1 under fluoroscopic guidance and IV sedation Pre-procedure pain level:  8/10 Post-procedure: 0/10 (100% relief)  Sedation: Sedation provided.  Effectiveness during initial hour after procedure(Ultra-Short Term Relief): 100 %   Local anesthetic used: Long-acting (4-6 hours) Effectiveness: Defined as any analgesic benefit obtained secondary to the administration of local anesthetics. This carries significant diagnostic value as to the etiological location, or anatomical origin, of the pain. Duration of benefit is expected to coincide with the duration of the local anesthetic used.  Effectiveness during initial 4-6 hours after procedure(Short-Term Relief): 100 %   Long-term benefit: Defined as any relief past the pharmacologic duration of the local anesthetics.  Effectiveness past the initial 6 hours after procedure(Long-Term Relief): 50 % (Just now starting to feel better after 4 weeks)   Current benefits: Defined as benefit that persist at this time.   Analgesia:  >50% relief Function: Angela Espinoza reports improvement in function ROM: Angela Espinoza reports improvement in ROM  Pharmacotherapy Assessment  Analgesic: No opioid analgesics from our practice. MME/day:0 mg/day   Monitoring: Pharmacotherapy: No side-effects or adverse reactions reported. [place] PMP: PDMP reviewed during this encounter.       Compliance: No problems identified. Effectiveness: Clinically acceptable. Plan: Refer to "POC".  UDS:  Summary  Date Value Ref Range Status  12/10/2017 FINAL  Final    Comment:    ==================================================================== TOXASSURE COMP DRUG ANALYSIS,UR ==================================================================== Test                             Result       Flag       Units Drug  Present and Declared for Prescription Verification   Cyclobenzaprine                PRESENT      EXPECTED   Amitriptyline                  PRESENT      EXPECTED   Metoprolol                     PRESENT      EXPECTED Drug Absent but Declared for Prescription Verification   Desmethylcyclobenzaprine       Not Detected UNEXPECTED   Duloxetine                     Not Detected UNEXPECTED ==================================================================== Test                      Result    Flag   Units      Ref Range   Creatinine              28               mg/dL      >=16 ==================================================================== Declared Medications:  The flagging and interpretation on this report are based on the  following declared medications.  Unexpected results may arise from  inaccuracies in the declared medications.  **Note: The testing scope of this panel includes these medications:  Amitriptyline  Cyclobenzaprine  Duloxetine  Metoprolol  **Note: The testing scope of this panel does not include following  reported medications:  Cholecalciferol  Hydrochlorothiazide  Levocetirizine  Losartan (Losartan Potassium)  Magnesium  Meloxicam  Multivitamin  Supplement  Supplement (Omega-3)  Vitamin B (Biotin)  Vitamin C ==================================================================== For clinical consultation, please call [phone]. ====================================================================    Laboratory Chemistry Profile (12 mo)  Renal: 01/01/2018: BUN 10; BUN/Creatinine Ratio 11; Creatinine, Ser 0.88  Lab Results  Component Value Date   GFRAA 81 01/01/2018   GFRNONAA 70 01/01/2018   Hepatic: 01/01/2018: Albumin 4.6 Lab Results  Component Value Date   AST 26 01/01/2018   ALT 42 08/10/2011   Other: 12/10/2017: CRP 1; Sed Rate 3; Vitamin B-12 1,807 01/01/2018: 25-Hydroxy, Vitamin D 40; 25-Hydroxy, Vitamin D-2 <1.0; 25-Hydroxy, Vitamin D-3  40 Note: Above Lab results reviewed.  Imaging  Last 90 days:  Dg Pain Clinic C-arm 1-60 Min No Report  Result Date: 09/30/2018 Fluoro was used, but no Radiologist interpretation will be provided. Please refer to "NOTES" tab for provider progress note.  US Breast Ltd Uni Left Inc Axilla  Result Date: 09/19/2018 CLINICAL DATA:  Left superior breast asymmetry seen on most recent screening mammography. EXAM: DIGITAL DIAGNOSTIC LEFT MAMMOGRAM WITH CAD AND TOMO ULTRASOUND LEFT BREAST COMPARISON:  Previous exam(s). ACR Breast Density Category c: The breast tissue is heterogeneously dense, which may obscure small masses. FINDINGS: Additional mammographic views of the left breast demonstrate persistent subtle asymmetry/distortion in the upper left breast, middle to posterior depth, seen on the ML and MLO views. According to 3D images it localizes in the upper slightly outer quadrant. Mammographic images were processed with CAD. On physical exam, no suspicious masses are palpated. Targeted ultrasound is performed, showing no corresponding abnormality. No suspicious masses or shadowing lesions. No evidence of left axillary lymphadenopathy. IMPRESSION: Subtle left superior breast asymmetry/distortion, without sonographic correlation. RECOMMENDATION: Stereotactic core needle biopsy of the left breast. If the abnormality could not be reproduced during the biopsy, then six-month imaging follow-up may be considered. I have discussed the findings and recommendations with the patient. Results were also provided in writing at the conclusion of the visit. If applicable, a reminder letter will be sent to the patient regarding the next appointment. BI-RADS CATEGORY  4: Suspicious. Electronically Signed   By: Fidela Salisbury M.D.   On: 09/19/2018 12:02   Mm Diag Breast Tomo Uni Left  Result Date: 09/19/2018 CLINICAL DATA:  Left superior breast asymmetry seen on most recent screening mammography. EXAM: DIGITAL DIAGNOSTIC  LEFT MAMMOGRAM WITH CAD AND TOMO ULTRASOUND LEFT BREAST COMPARISON:  Previous exam(s). ACR Breast Density Category c: The breast tissue is heterogeneously dense, which may obscure small masses. FINDINGS: Additional mammographic views of the left breast demonstrate persistent subtle asymmetry/distortion in the upper left breast, middle to posterior depth, seen on the ML and MLO views. According to 3D images it localizes in the upper slightly outer quadrant. Mammographic images were processed with CAD. On physical exam, no suspicious masses are palpated. Targeted ultrasound is performed, showing no corresponding abnormality. No suspicious masses or shadowing lesions. No evidence of left axillary lymphadenopathy. IMPRESSION: Subtle left superior breast asymmetry/distortion, without sonographic correlation. RECOMMENDATION: Stereotactic core needle biopsy of the left breast. If the abnormality could not be reproduced during the biopsy, then six-month imaging follow-up may be considered. I have discussed the findings and recommendations with the patient. Results were also provided in writing at the conclusion of the visit. If applicable, a reminder letter will be sent to the patient regarding the next appointment. BI-RADS CATEGORY  4: Suspicious. Electronically Signed  By: Ted Mcalpine M.D.   On: 09/19/2018 12:02   Mm 3d Screen Breast Bilateral  Result Date: 09/12/2018 CLINICAL DATA:  Screening. EXAM: DIGITAL SCREENING BILATERAL MAMMOGRAM WITH TOMO AND CAD COMPARISON:  Previous exam(s). ACR Breast Density Category c: The breast tissue is heterogeneously dense, which may obscure small masses. FINDINGS: In the left breast, a possible asymmetry warrants further evaluation. In the right breast, no findings suspicious for malignancy. Images were processed with CAD. IMPRESSION: Further evaluation is suggested for possible asymmetry in the left breast. RECOMMENDATION: Diagnostic mammogram and possibly ultrasound of  the left breast. (Code:[id number]) The patient will be contacted regarding the findings, and additional imaging will be scheduled. BI-RADS CATEGORY  0: Incomplete. Need additional imaging evaluation and/or prior mammograms for comparison. Electronically Signed   By: Emmaline Kluver M.D.   On: 09/12/2018 13:06   Mm Clip Placement Left  Result Date: 10/07/2018 CLINICAL DATA:  Post stereotactic guided biopsy of an asymmetry/possible subtle distortion in the upper outer left breast. EXAM: DIAGNOSTIC LEFT MAMMOGRAM POST STEREOTACTIC BIOPSY COMPARISON:  Previous exam(s). FINDINGS: Mammographic images were obtained following stereotactic guided biopsy of an asymmetry/possible distortion in the upper-outer left breast a coil shaped biopsy marking clip is present and felt to be displaced approximately 0.6 cm lateral to the biopsy site. IMPRESSION: Coil shaped biopsy marking clip felt to be displaced approximately 0.6 cm lateral to the biopsied asymmetry/possible distortion in the upper-outer left breast. Final Assessment: Post Procedure Mammograms for Marker Placement Electronically Signed   By: Edwin Cap M.D.   On: 10/07/2018 13:33   Mm Lt Breast Bx W Loc Dev 1st Lesion Image Bx Spec Stereo Guide  Addendum Date: 10/09/2018   ADDENDUM REPORT: 10/09/2018 12:23 ADDENDUM: PATHOLOGY revealed: BREAST, LEFT UPPER OUTER QUADRANT; - BENIGN BREAST TISSUE WITH AREAS CONTAINING A PREDOMINANCE OF ADIPOSE TISSUE. - NEGATIVE FOR ATYPIA AND MALIGNANCY. Pathology results are CONCORDANT with imaging findings, per Dr. Edwin Cap. Pathology results were discussed with patient via telephone. The patient reported doing well after the biopsy with tenderness at the site. Post biopsy care instructions were reviewed and questions were answered. The patient was encouraged to call [facility] for any additional concerns. Recommendation: The patient was requested to return in six months for unilateral LEFT breast  diagnostic mammogram and possible ultrasound. Addendum by Randa Lynn RN on 10/09/2018. Electronically Signed   By: Edwin Cap M.D.   On: 10/09/2018 12:23   Result Date: 10/09/2018 CLINICAL DATA:  63 year old female with left breast asymmetry/possible distortion. EXAM: BREAST STEREOTACTIC CORE NEEDLE BIOPSY COMPARISON:  Previous exams. FINDINGS: The patient and I discussed the procedure of stereotactic-guided biopsy including benefits and alternatives. We discussed the high likelihood of a successful procedure. We discussed the risks of the procedure including infection, bleeding, tissue injury, clip migration, and inadequate sampling. Informed written consent was given. The usual time out protocol was performed immediately prior to the procedure. Using sterile technique and 1% Lidocaine as local anesthetic, under stereotactic guidance, a 9 gauge vacuum assisted device was used to perform core needle biopsy of the asymmetry/possible distortion in the upper-outer left breast using a lateral to medial approach. Lesion quadrant: Upper-outer At the conclusion of the procedure, a coil shaped tissue marker clip was deployed into the biopsy cavity. Follow-up 2-view mammogram was performed and dictated separately. IMPRESSION: Stereotactic-guided biopsy of the asymmetry/possible distortion in the upper-outer left breast. No apparent complications. Electronically Signed: By: Edwin Cap M.D. On: 10/07/2018 13:27    Assessment  The  primary encounter diagnosis was Chronic pain syndrome. Diagnoses of Chronic low back pain (Primary Area of Pain) (Left) w/o sciatica, Thoracic facet syndrome (Left), Lumbar facet joint syndrome (Left), and Neurogenic pain were also pertinent to this visit.  Plan of Care  I am having Angela Espinoza start on gabapentin. I am also having her maintain her vitamin C, Omega-3 Fatty Acids (FISH OIL PO), losartan, metoprolol succinate, multivitamin, levocetirizine, Biotin,  Cholecalciferol, Black Pepper-Turmeric, DULoxetine, gabapentin, lidocaine, meloxicam, BLACK ELDERBERRY PO, Calcium Carbonate-Vitamin D (CALCIUM 500 + D PO), and Magnesium.  Pharmacotherapy (Medications Ordered): Meds ordered this encounter  Medications  . gabapentin (NEURONTIN) 100 MG capsule    Sig: Take 1-3 capsules (100-300 mg total) by mouth 4 (four) times daily. Follow written titration schedule    Dispense:  360 capsule    Refill:  0    Fill one day early if pharmacy is closed on scheduled refill date. May substitute for generic if available.   Orders:  No orders of the defined types were placed in this encounter.  Follow-up plan:   Return in about 30 days (around 12/03/2018) for (VV), (MM) to evaluate gabapentin titration.      Interventional management options: Considering:   Diagnostic left L1-2LESI #1   Palliative PRN treatment(s):   Palliative left thoracolumbar facet medial branch (T12, L1, L2, & L3) block #3  Palliative left thoracolumbar (T12, L1, L2, & L3) medial branch facet RFA #2 (last done 09/30/2018)    Recent Visits Date Type Provider Dept  09/30/18 Procedure visit Delano MetzNaveira, Jillianna Stanek, MD [facility]  09/15/18 Office Visit Delano MetzNaveira, Latria Mccarron, MD [facility]  08/12/18 Office Visit Delano MetzNaveira, Batina Dougan, MD [facility]  Showing recent visits within past 90 days and meeting all other requirements   Today's Visits Date Type Provider Dept  11/03/18 Office Visit Delano MetzNaveira, Darryel Diodato, MD [facility]  Showing today's visits and meeting all other requirements   Future Appointments No visits were found meeting these conditions.  Showing future appointments within next 90 days and meeting all other requirements   I discussed the assessment and treatment plan with the patient. The patient was provided an opportunity to ask questions and all were answered. The patient agreed with the plan and demonstrated an understanding of the  instructions.  Patient advised to call back or seek an in-person evaluation if the symptoms or condition worsens.  Total duration of non-face-to-face encounter: 20 minutes.  Note by: Oswaldo DoneFrancisco A Geralyn Figiel, MD Date: 11/03/2018; Time: 2:32 PM  Note: This dictation was prepared with Dragon dictation. Any transcriptional errors that may result from this process are unintentional.  Disclaimer:  * Given the special circumstances of the COVID-19 pandemic, the federal government has announced that the Office for Civil Rights (OCR) will exercise its enforcement discretion and will not impose penalties on physicians using telehealth in the event of noncompliance with regulatory requirements under the [employer] Insurance Portability and Accountability Act (HIPAA) in connection with the good faith provision of telehealth during the COVID-19 national public health emergency. (AMA)

## 2018-11-03 ENCOUNTER — Other Ambulatory Visit: Payer: Self-pay

## 2018-11-03 ENCOUNTER — Other Ambulatory Visit: Payer: Self-pay | Admitting: Family Medicine

## 2018-11-03 ENCOUNTER — Ambulatory Visit
Payer: Medicare Other | Attending: Student in an Organized Health Care Education/Training Program | Admitting: Pain Medicine

## 2018-11-03 DIAGNOSIS — M792 Neuralgia and neuritis, unspecified: Secondary | ICD-10-CM | POA: Insufficient documentation

## 2018-11-03 DIAGNOSIS — M47816 Spondylosis without myelopathy or radiculopathy, lumbar region: Secondary | ICD-10-CM

## 2018-11-03 DIAGNOSIS — M47894 Other spondylosis, thoracic region: Secondary | ICD-10-CM | POA: Diagnosis not present

## 2018-11-03 DIAGNOSIS — M545 Low back pain, unspecified: Secondary | ICD-10-CM

## 2018-11-03 DIAGNOSIS — Z13820 Encounter for screening for osteoporosis: Secondary | ICD-10-CM

## 2018-11-03 DIAGNOSIS — G894 Chronic pain syndrome: Secondary | ICD-10-CM | POA: Diagnosis not present

## 2018-11-03 DIAGNOSIS — G8929 Other chronic pain: Secondary | ICD-10-CM

## 2018-11-03 MED ORDER — GABAPENTIN 100 MG PO CAPS: 100.0000 mg | ORAL_CAPSULE | Freq: Four times a day (QID) | ORAL | 0 refills | Status: DC

## 2018-11-03 NOTE — Patient Instructions (Addendum)
____________________________________________________________________________________________  Gabapentin Titration  Medication used: Gabapentin (Generic Name) or Neurontin (Brand Name) 100 mg tablets/capsules  Reasons to stop increasing the dose:  Reason 1: You get good relief of symptoms, in which case there is no need to increase the daily dose any further.    Reason 2: You develop some side effects, such as sleeping all of the time, difficulty concentrating, or becoming disoriented, in which case you need to go down on the dose, to the prior level, where you were not experiencing any side effects. Stay on that dose longer, to allow more time for your body to get use it, before attempting to increase it again.   Steps to increase medication: Step 1: Start by taking 1 (one) tablet at bedtime x 7 (seven) days.  Step 2: Increase dose to 2 (two) tablets at bedtime. Stay on this dose x 7 (seven) days.  Step 3: Next increase it to 3 (three) tablets at bedtime. Stay on this dose x another 7 (seven) days.  Step 4: Next, add 1 (one) tablet at noon with lunch. Continue this dose x another 7 (seven) days.  Step 5: Add 1 (one) tablet in the afternoon with dinner. Stay on this dose x another 7 (seven) days.  Step 6: At this point you should be taking the medicine 4 (four) times a day. This daily regimen of taking the medicine 4 (four) times a day, will be maintained from now on. You should not take any doses any sooner than every 6 (six) hours.  Step 7: After 7 (seven) days of taking 3 (three) tablet at bedtime, 1 (one) tablet at noon, 1 (one) tablet in the afternoon, and 1 (one) tablet in the morning, begin taking 2 (two) tablets at noon with lunch. Stay on this dose x another 7 (seven) days.   Step 8: After 7 (seven) days of taking 3 (three) tablet at bedtime, 2 (two) tablets at noon, 1 (one) tablet in the afternoon, and 1 (one) tablet in the morning, begin taking 2 (two) tablets in the afternoon  with dinner. Stay on this dose x another 7 (seven) days.   Step 9: After 7 (seven) days of taking 3 (three) tablet at bedtime, 2 (two) tablets at noon, 2 (two) tablets in the afternoon, and 1 (one) tablet in the morning, begin taking 2 (two) tablets in the morning with breakfast. Stay on this dose x another 7 (seven) days. At this point you should be taking the medicine 4 (four) times a day, or about every 6 (six) hours. This daily regimen of taking the medicine 4 (four) times a day, will be maintained from now on. You should not take any doses any sooner than every 6 (six) hours.  Step 10: After 7 (seven) days of taking 3 (three) tablet at bedtime, 2 (two) tablets at noon, 2 (two) tablets in the afternoon, and 2 (two) tablets in the morning, begin taking 3 (three) tablets at noon with lunch. Stay on this dose x another 7 (seven) days.   Step 11: After 7 (seven) days of taking 3 (three) tablet at bedtime, 3 (three) tablets at noon, 2 (two) tablets in the afternoon, and 2 (two) tablets in the morning, begin taking 3 (three) tablets in the afternoon with dinner. Stay on this dose x another 7 (seven) days.   Step 12: After 7 (seven) days of taking 3 (three) tablet at bedtime, 3 (three) tablets at noon, 3 (three) tablets in the afternoon, and 2 (two)   tablet in the morning, begin taking 3 (three) tablets in the morning with breakfast. Stay on this dose x another 7 (seven) days. At this point you should be taking the medicine 4 (four) times a day, or about every 6 (six) hours. This daily regimen of taking the medicine 4 (four) times a day, will be maintained from now on.   Endpoint: Once you have reached the maximum dose you can tolerate without side-effects, contact your physician so as to evaluate the results of the regimen.   Questions: Feel free to contact us for any questions or problems at [phone] ____________________________________________________________________________________________  

## 2018-11-12 ENCOUNTER — Ambulatory Visit: Payer: Medicare Other | Admitting: Student in an Organized Health Care Education/Training Program

## 2018-11-24 ENCOUNTER — Ambulatory Visit
Admission: RE | Admit: 2018-11-24 | Discharge: 2018-11-24 | Disposition: A | Discharge status: Home / Self Care | Payer: Medicare Other | Source: Ambulatory Visit | Attending: Family Medicine | Admitting: Family Medicine

## 2018-11-24 DIAGNOSIS — Z13820 Encounter for screening for osteoporosis: Secondary | ICD-10-CM | POA: Diagnosis present

## 2018-11-24 DIAGNOSIS — M85851 Other specified disorders of bone density and structure, right thigh: Secondary | ICD-10-CM | POA: Insufficient documentation

## 2018-12-02 ENCOUNTER — Telehealth: Payer: Self-pay | Admitting: *Deleted

## 2018-12-02 ENCOUNTER — Encounter: Payer: Self-pay | Admitting: Pain Medicine

## 2018-12-02 NOTE — Progress Notes (Signed)
Pain Management Virtual Encounter Note - Virtual Visit via Telephone Telehealth (real-time audio visits between healthcare provider and patient).   Patient's Phone No. & Preferred Pharmacy:  [phone] (home); [phone] (mobile); (Preferred) [phone] [email or web address]  [id number] [place] Rough, [facility] - [address] [place] [address] Phone: [phone] Fax: [phone]    Pre-screening note:  Our staff contacted Angela Espinoza and offered her an "in person", "face-to-face" appointment versus a telephone encounter. She indicated preferring the telephone encounter, at this time.   Reason for Virtual Visit: COVID-19*  Social distancing based on CDC and AMA recommendations.   I contacted Angela Espinoza on 12/03/2018 via telephone.      I clearly identified myself as Oswaldo Done, MD. I verified that I was speaking with the correct person using two identifiers (Name: Angela Espinoza, and date of birth: Apr 08, 1955).  Advanced Informed Consent I sought verbal advanced consent from Angela Espinoza for virtual visit interactions. I informed Angela Espinoza of possible security and privacy concerns, risks, and limitations associated with providing "not-in-person" medical evaluation and management services. I also informed Angela Espinoza of the availability of "in-person" appointments. Finally, I informed her that there would be a charge for the virtual visit and that she could be  personally, fully or partially, financially responsible for it. Angela Espinoza expressed understanding and agreed to proceed.   Historic Elements   Angela Espinoza is a 63 y.o. year old, female patient evaluated today after her last encounter by our practice on 12/02/2018. Angela Espinoza  has a past medical history of Anxiety, Asthma, Depression, Hyperlipidemia, Hypertension, and IBS (irritable bowel syndrome). She also  has a past surgical history that  includes Oophorectomy; Exploratory laparotomy; Foot surgery; Abdominal hysterectomy; Colonoscopy; Tonsillectomy; Axillary lymph node biopsy (Right, 08/03/2014); Kyphoplasty (N/A, 12/10/2014); and Breast biopsy (Left, 10/07/2018). Angela Espinoza has a current medication list which includes the following prescription(s): biotin, black elderberry, black pepper-turmeric, calcium carbonate-vitamin d, cholecalciferol, duloxetine, gabapentin, levocetirizine, lidocaine, losartan, magnesium, meloxicam, metoprolol succinate, metoprolol tartrate, multivitamin, omega-3 fatty acids, vitamin c, esomeprazole, and gabapentin. She  reports that she is a non-smoker but has been exposed to tobacco smoke. She has never used smokeless tobacco. She reports current alcohol use. She reports that she does not use drugs. Angela Espinoza is allergic to indapamide.   HPI  Today, she is being contacted for medication management.  Encounter schedule to evaluate gabapentin titration.  The patient indicates improving significantly with the gabapentin and currently she is taking 200 mg in a.m. and in the afternoon and 300 mg at noon and at bedtime.  At this point, I will be sending a prescription for the 300 mg pill so that she can substitute the 3 100 mg pills for a single 300 mg pill, every time that she gets to the point.  I will continue to use the 100 mg pills for the titration.  She also indicates having some problems with heartburn and reflux and therefore I will be sending some Nexium for her to use.  However, today we reviewed some of her medications since some of them may be causing some of the other side effects that she is having.  For example the vitamin C is likely to be contributing significantly to her heartburn and acid reflux.  I will call her back in about 6 weeks to see how she is doing and make any necessary adjustments, if needed.  So far she is pleased with the medication and  she can see some benefit with it.  She currently  denies any other type of side effects other than that reflux.  Pharmacotherapy Assessment  Analgesic: No opioid analgesics from our practice. MME/day:0 mg/day   Monitoring: Pharmacotherapy: No side-effects or adverse reactions reported. [place] PMP: PDMP reviewed during this encounter.       Compliance: No problems identified. Effectiveness: Clinically acceptable. Plan: Refer to "POC".  UDS:  Summary  Date Value Ref Range Status  12/10/2017 FINAL  Final    Comment:    ==================================================================== TOXASSURE COMP DRUG ANALYSIS,UR ==================================================================== Test                             Result       Flag       Units Drug Present and Declared for Prescription Verification   Cyclobenzaprine                PRESENT      EXPECTED   Amitriptyline                  PRESENT      EXPECTED   Metoprolol                     PRESENT      EXPECTED Drug Absent but Declared for Prescription Verification   Desmethylcyclobenzaprine       Not Detected UNEXPECTED   Duloxetine                     Not Detected UNEXPECTED ==================================================================== Test                      Result    Flag   Units      Ref Range   Creatinine              28               mg/dL      >=09>=20 ==================================================================== Declared Medications:  The flagging and interpretation on this report are based on the  following declared medications.  Unexpected results may arise from  inaccuracies in the declared medications.  **Note: The testing scope of this panel includes these medications:  Amitriptyline  Cyclobenzaprine  Duloxetine  Metoprolol  **Note: The testing scope of this panel does not include following  reported medications:  Cholecalciferol  Hydrochlorothiazide  Levocetirizine  Losartan (Losartan Potassium)  Magnesium  Meloxicam  Multivitamin   Supplement  Supplement (Omega-3)  Vitamin B (Biotin)  Vitamin C ==================================================================== For clinical consultation, please call [phone]. ====================================================================    Laboratory Chemistry Profile (12 mo)  Renal: 01/01/2018: BUN 10; BUN/Creatinine Ratio 11; Creatinine, Ser 0.88  Lab Results  Component Value Date   GFRAA 81 01/01/2018   GFRNONAA 70 01/01/2018   Hepatic: 01/01/2018: Albumin 4.6 Lab Results  Component Value Date   AST 26 01/01/2018   ALT 42 08/10/2011   Other: 12/10/2017: CRP 1; Sed Rate 3; Vitamin B-12 1,807 01/01/2018: 25-Hydroxy, Vitamin D 40; 25-Hydroxy, Vitamin D-2 <1.0; 25-Hydroxy, Vitamin D-3 40 Note: Above Lab results reviewed.  Imaging  Last 90 days:  Dg Bone Density (dxa)  Result Date: 11/24/2018 EXAM: DUAL X-RAY ABSORPTIOMETRY (DXA) FOR BONE MINERAL DENSITY IMPRESSION: Dear Dr Ronny FlurryHeadrick, Your patient Angela AusBrenda K Matney completed a FRAX assessment on 11/24/2018 using the Lunar iDXA DXA System (analysis version: 14.10) manufactured by [employer]. The following summarizes the results of  our evaluation. PATIENT BIOGRAPHICAL: Name: Angela Espinoza, Angela Espinoza Patient ID: [id number] Birth Date: 12-05-1955 Height:    62.0 in. Gender:     Female    [age]       Weight:    140.7 lbs. Ethnicity:  White                            Exam Date: 11/24/2018 FRAX* RESULTS:  (version: 3.5) 10-year Probability of Fracture1 Major Osteoporotic Fracture2 Hip Fracture 13.9% 1.2% Population: [place] (Caucasian) Risk Factors: History of Fracture (Adult) Based on Femur (Right) Neck BMD 1 -The 10-year probability of fracture may be lower than reported if the patient has received treatment. 2 -Major Osteoporotic Fracture: Clinical Spine, Forearm, Hip or Shoulder *FRAX is a Armed forces logistics/support/administrative officer of the [employer] for Metabolic Bone Disease, a World Science writer (WHO)  [employer]. ASSESSMENT: The probability of a major osteoporotic fracture is 13.9% within the next ten years. The probability of a hip fracture is 1.2% within the next ten years. . Technologist: [employer] PATIENT BIOGRAPHICAL: Name: Angela Espinoza, Angela Espinoza Patient ID: [id number] Birth Date: Jun 11, 1955 Height: 62.0 in. Gender: Female Exam Date: 11/24/2018 Weight: 140.7 lbs. Indications: Caucasian, History of Fracture (Adult), Hysterectomy, Oophorectomy Unilateral, Postmenopausal, Surgical Induced Menopause Fractures: Spine Treatments: calcium w/ vit D, Gabapentin, Multi-Vitamin, Vitamin D ASSESSMENT: The BMD measured at Femur Total Right is 0.849 g/cm2 with a T-score of -1.3. This patient is considered osteopenic according to World Health Organization [facility]) criteria. L1 was excluded due to surgery. Site Region Measured Measured WHO Young Adult BMD Date       Age      Classification T-score AP Spine L2-L4 11/24/2018 64.6 Normal -0.7 1.134 g/cm2 DualFemur Total Right 11/24/2018 64.6 Osteopenia -1.3 0.849 g/cm2 World Health Organization [facility]) criteria for post-menopausal, Caucasian Women: Normal:       T-score at or above -1 SD Osteopenia:   T-score between -1 and -2.5 SD Osteoporosis: T-score at or below -2.5 SD RECOMMENDATIONS: 1. All patients should optimize calcium and vitamin D intake. 2. Consider FDA-approved medical therapies in postmenopausal women and men aged 78 years and older, based on the following: a. A hip or vertebral(clinical or morphometric) fracture b. T-score < -2.5 at the femoral neck or spine after appropriate evaluation to exclude secondary causes c. Low bone mass (T-score between -1.0 and -2.5 at the femoral neck or spine) and a 10-year probability of a hip fracture > 3% or a 10-year probability of a major osteoporosis-related fracture > 20% based on the [place]-adapted WHO algorithm d. Clinician judgment and/or patient preferences may indicate treatment for people with 10-year fracture probabilities  above or below these levels FOLLOW-UP: People with diagnosed cases of osteoporosis or at high risk for fracture should have regular bone mineral density tests. For patients eligible for Medicare, routine testing is allowed once every 2 years. The testing frequency can be increased to one year for patients who have rapidly progressing disease, those who are receiving or discontinuing medical therapy to restore bone mass, or have additional risk factors. I have reviewed this report, and agree with the above findings. [facility] Electronically Signed   By: Charlett Nose M.D.   On: 11/24/2018 11:24   Dg Pain Clinic C-arm 1-60 Min No Report  Result Date: 09/30/2018 Fluoro was used, but no Radiologist interpretation will be provided. Please refer to "NOTES" tab for provider progress note.  US Breast Ltd Uni Left Inc Axilla  Result Date: 09/19/2018 CLINICAL DATA:  Left superior breast asymmetry seen on most recent screening mammography. EXAM: DIGITAL DIAGNOSTIC LEFT MAMMOGRAM WITH CAD AND TOMO ULTRASOUND LEFT BREAST COMPARISON:  Previous exam(s). ACR Breast Density Category c: The breast tissue is heterogeneously dense, which may obscure small masses. FINDINGS: Additional mammographic views of the left breast demonstrate persistent subtle asymmetry/distortion in the upper left breast, middle to posterior depth, seen on the ML and MLO views. According to 3D images it localizes in the upper slightly outer quadrant. Mammographic images were processed with CAD. On physical exam, no suspicious masses are palpated. Targeted ultrasound is performed, showing no corresponding abnormality. No suspicious masses or shadowing lesions. No evidence of left axillary lymphadenopathy. IMPRESSION: Subtle left superior breast asymmetry/distortion, without sonographic correlation. RECOMMENDATION: Stereotactic core needle biopsy of the left breast. If the abnormality could not be reproduced during the biopsy, then six-month  imaging follow-up may be considered. I have discussed the findings and recommendations with the patient. Results were also provided in writing at the conclusion of the visit. If applicable, a reminder letter will be sent to the patient regarding the next appointment. BI-RADS CATEGORY  4: Suspicious. Electronically Signed   By: Fidela Salisbury M.D.   On: 09/19/2018 12:02   Mm Diag Breast Tomo Uni Left  Result Date: 09/19/2018 CLINICAL DATA:  Left superior breast asymmetry seen on most recent screening mammography. EXAM: DIGITAL DIAGNOSTIC LEFT MAMMOGRAM WITH CAD AND TOMO ULTRASOUND LEFT BREAST COMPARISON:  Previous exam(s). ACR Breast Density Category c: The breast tissue is heterogeneously dense, which may obscure small masses. FINDINGS: Additional mammographic views of the left breast demonstrate persistent subtle asymmetry/distortion in the upper left breast, middle to posterior depth, seen on the ML and MLO views. According to 3D images it localizes in the upper slightly outer quadrant. Mammographic images were processed with CAD. On physical exam, no suspicious masses are palpated. Targeted ultrasound is performed, showing no corresponding abnormality. No suspicious masses or shadowing lesions. No evidence of left axillary lymphadenopathy. IMPRESSION: Subtle left superior breast asymmetry/distortion, without sonographic correlation. RECOMMENDATION: Stereotactic core needle biopsy of the left breast. If the abnormality could not be reproduced during the biopsy, then six-month imaging follow-up may be considered. I have discussed the findings and recommendations with the patient. Results were also provided in writing at the conclusion of the visit. If applicable, a reminder letter will be sent to the patient regarding the next appointment. BI-RADS CATEGORY  4: Suspicious. Electronically Signed   By: Fidela Salisbury M.D.   On: 09/19/2018 12:02   Mm 3d Screen Breast Bilateral  Result Date:  09/12/2018 CLINICAL DATA:  Screening. EXAM: DIGITAL SCREENING BILATERAL MAMMOGRAM WITH TOMO AND CAD COMPARISON:  Previous exam(s). ACR Breast Density Category c: The breast tissue is heterogeneously dense, which may obscure small masses. FINDINGS: In the left breast, a possible asymmetry warrants further evaluation. In the right breast, no findings suspicious for malignancy. Images were processed with CAD. IMPRESSION: Further evaluation is suggested for possible asymmetry in the left breast. RECOMMENDATION: Diagnostic mammogram and possibly ultrasound of the left breast. (Code:[id number]-L-[id number]) The patient will be contacted regarding the findings, and additional imaging will be scheduled. BI-RADS CATEGORY  0: Incomplete. Need additional imaging evaluation and/or prior mammograms for comparison. Electronically Signed   By: Audie Pinto M.D.   On: 09/12/2018 13:06   Mm Clip Placement Left  Result Date: 10/07/2018 CLINICAL DATA:  Post stereotactic guided biopsy of an asymmetry/possible subtle  distortion in the upper outer left breast. EXAM: DIAGNOSTIC LEFT MAMMOGRAM POST STEREOTACTIC BIOPSY COMPARISON:  Previous exam(s). FINDINGS: Mammographic images were obtained following stereotactic guided biopsy of an asymmetry/possible distortion in the upper-outer left breast a coil shaped biopsy marking clip is present and felt to be displaced approximately 0.6 cm lateral to the biopsy site. IMPRESSION: Coil shaped biopsy marking clip felt to be displaced approximately 0.6 cm lateral to the biopsied asymmetry/possible distortion in the upper-outer left breast. Final Assessment: Post Procedure Mammograms for Marker Placement Electronically Signed   By: Edwin Cap M.D.   On: 10/07/2018 13:33   Mm Lt Breast Bx W Loc Dev 1st Lesion Image Bx Spec Stereo Guide  Addendum Date: 10/09/2018   ADDENDUM REPORT: 10/09/2018 12:23 ADDENDUM: PATHOLOGY revealed: BREAST, LEFT UPPER OUTER QUADRANT; - BENIGN BREAST TISSUE WITH AREAS  CONTAINING A PREDOMINANCE OF ADIPOSE TISSUE. - NEGATIVE FOR ATYPIA AND MALIGNANCY. Pathology results are CONCORDANT with imaging findings, per Dr. Edwin Cap. Pathology results were discussed with patient via telephone. The patient reported doing well after the biopsy with tenderness at the site. Post biopsy care instructions were reviewed and questions were answered. The patient was encouraged to call [facility] for any additional concerns. Recommendation: The patient was requested to return in six months for unilateral LEFT breast diagnostic mammogram and possible ultrasound. Addendum by Randa Lynn RN on 10/09/2018. Electronically Signed   By: Edwin Cap M.D.   On: 10/09/2018 12:23   Result Date: 10/09/2018 CLINICAL DATA:  63 year old female with left breast asymmetry/possible distortion. EXAM: BREAST STEREOTACTIC CORE NEEDLE BIOPSY COMPARISON:  Previous exams. FINDINGS: The patient and I discussed the procedure of stereotactic-guided biopsy including benefits and alternatives. We discussed the high likelihood of a successful procedure. We discussed the risks of the procedure including infection, bleeding, tissue injury, clip migration, and inadequate sampling. Informed written consent was given. The usual time out protocol was performed immediately prior to the procedure. Using sterile technique and 1% Lidocaine as local anesthetic, under stereotactic guidance, a 9 gauge vacuum assisted device was used to perform core needle biopsy of the asymmetry/possible distortion in the upper-outer left breast using a lateral to medial approach. Lesion quadrant: Upper-outer At the conclusion of the procedure, a coil shaped tissue marker clip was deployed into the biopsy cavity. Follow-up 2-view mammogram was performed and dictated separately. IMPRESSION: Stereotactic-guided biopsy of the asymmetry/possible distortion in the upper-outer left breast. No apparent complications. Electronically Signed: By:  Edwin Cap M.D. On: 10/07/2018 13:27    Assessment  The primary encounter diagnosis was Chronic pain syndrome. Diagnoses of Chronic low back pain (Primary Area of Pain) (Left) w/o sciatica, Thoracic facet syndrome (Left), Neurogenic pain, and Gastroesophageal reflux disease without esophagitis were also pertinent to this visit.  Plan of Care  I am having Angela Espinoza. Angela Espinoza start on esomeprazole and gabapentin. I am also having her maintain her vitamin C, Omega-3 Fatty Acids (FISH OIL PO), losartan, metoprolol succinate, multivitamin, levocetirizine, Biotin, Cholecalciferol, Black Pepper-Turmeric, DULoxetine, lidocaine, meloxicam, BLACK ELDERBERRY PO, Calcium Carbonate-Vitamin D (CALCIUM 500 + D PO), Magnesium, metoprolol tartrate, and gabapentin.  Pharmacotherapy (Medications Ordered): Meds ordered this encounter  Medications  . gabapentin (NEURONTIN) 100 MG capsule    Sig: Take 1-3 capsules (100-300 mg total) by mouth 4 (four) times daily. Follow written titration schedule    Dispense:  360 capsule    Refill:  1    Fill one day early if pharmacy is closed on scheduled refill date. May substitute for  generic if available.  . esomeprazole (NEXIUM) 20 MG capsule    Sig: Take 1 capsule (20 mg total) by mouth 2 (two) times daily before a meal for 14 days, THEN 1 capsule (20 mg total) at bedtime.    Dispense:  104 capsule    Refill:  0    Fill one day early if pharmacy is closed on scheduled refill date. May substitute for generic if available.  . gabapentin (NEURONTIN) 300 MG capsule    Sig: Take 1 capsule (300 mg total) by mouth 2 (two) times daily. At noon and HS    Dispense:  60 capsule    Refill:  1    Fill one day early if pharmacy is closed on scheduled refill date. May substitute for generic if available.   Orders:  No orders of the defined types were placed in this encounter.  Follow-up plan:   Return in about 6 weeks (around 01/14/2019) for (VV), (MM) to evaluate  gabapentin titration.      Interventional management options: Considering:   Diagnostic left L1-2LESI #1   Palliative PRN treatment(s):   Palliative left thoracolumbar facet medial branch (T12, L1, L2, & L3) block #3  Palliative left thoracolumbar (T12, L1, L2, & L3) medial branch facet RFA #2 (last done 09/30/2018)     Recent Visits Date Type Provider Dept  11/03/18 Office Visit Delano Metz, MD [facility]  09/30/18 Procedure visit Delano Metz, MD [facility]  09/15/18 Office Visit Delano Metz, MD [facility]  Showing recent visits within past 90 days and meeting all other requirements   Today's Visits Date Type Provider Dept  12/03/18 Telemedicine Delano Metz, MD [facility]  Showing today's visits and meeting all other requirements   Future Appointments No visits were found meeting these conditions.  Showing future appointments within next 90 days and meeting all other requirements   I discussed the assessment and treatment plan with the patient. The patient was provided an opportunity to ask questions and all were answered. The patient agreed with the plan and demonstrated an understanding of the instructions.  Patient advised to call back or seek an in-person evaluation if the symptoms or condition worsens.  Total duration of non-face-to-face encounter: 20 minutes.  Note by: Oswaldo Done, MD Date: 12/03/2018; Time: 10:23 AM  Note: This dictation was prepared with Dragon dictation. Any transcriptional errors that may result from this process are unintentional.  Disclaimer:  * Given the special circumstances of the COVID-19 pandemic, the federal government has announced that the Office for Civil Rights (OCR) will exercise its enforcement discretion and will not impose penalties on physicians using telehealth in the event of noncompliance with regulatory requirements under the [employer]  Portability and Accountability Act (HIPAA) in connection with the good faith provision of telehealth during the COVID-19 national public health emergency. (AMA)

## 2018-12-03 ENCOUNTER — Other Ambulatory Visit: Payer: Self-pay

## 2018-12-03 ENCOUNTER — Ambulatory Visit: Payer: Medicare Other | Attending: Pain Medicine | Admitting: Pain Medicine

## 2018-12-03 DIAGNOSIS — M47894 Other spondylosis, thoracic region: Secondary | ICD-10-CM

## 2018-12-03 DIAGNOSIS — G894 Chronic pain syndrome: Secondary | ICD-10-CM

## 2018-12-03 DIAGNOSIS — M792 Neuralgia and neuritis, unspecified: Secondary | ICD-10-CM

## 2018-12-03 DIAGNOSIS — K219 Gastro-esophageal reflux disease without esophagitis: Secondary | ICD-10-CM | POA: Insufficient documentation

## 2018-12-03 DIAGNOSIS — G8929 Other chronic pain: Secondary | ICD-10-CM

## 2018-12-03 DIAGNOSIS — M545 Low back pain, unspecified: Secondary | ICD-10-CM

## 2018-12-03 MED ORDER — GABAPENTIN 100 MG PO CAPS: 100.0000 mg | ORAL_CAPSULE | Freq: Four times a day (QID) | ORAL | 1 refills | Status: DC

## 2018-12-03 MED ORDER — ESOMEPRAZOLE MAGNESIUM 20 MG PO CPDR: DELAYED_RELEASE_CAPSULE | ORAL | 0 refills | Status: DC

## 2018-12-03 MED ORDER — GABAPENTIN 300 MG PO CAPS: 300.0000 mg | ORAL_CAPSULE | Freq: Two times a day (BID) | ORAL | 1 refills | Status: DC

## 2018-12-04 ENCOUNTER — Telehealth: Payer: Self-pay | Admitting: Pain Medicine

## 2018-12-04 NOTE — Telephone Encounter (Signed)
Patient states the meds Dr. Dossie Arbour called in for her gerd is $700 at [facility]. Is there something else that can be called in

## 2018-12-04 NOTE — Telephone Encounter (Signed)
States she will try something OTC and discuss with pharmacist. If the OTC does not work she will call us back if needed.

## 2018-12-10 ENCOUNTER — Telehealth: Payer: Self-pay | Admitting: Pain Medicine

## 2018-12-10 NOTE — Telephone Encounter (Signed)
Says the med Dr. Dossie Arbour sent in will cost patient $700. She cannot do this and would like to know if there is something else he can prescribe or should she try OTC meds. Please call and discuss with patient.

## 2018-12-10 NOTE — Telephone Encounter (Signed)
Spoke to patient to let her know that she can use prilosec or generic version along with Magnesium which she is already taking and take them together which, Dr Dossie Arbour has said, will be the same as taking the Nexium.  Patient verbalizes u/o information.

## 2018-12-10 NOTE — Telephone Encounter (Signed)
Spoke with patient and she is unable to afford the Nexium as it was going to cost $700.  States she has been using prilosec OTC and she is some better with the GERD.  She wanted me to talk to Dr Dossie Arbour and let him know about this to see if there is anything else he would suggest.

## 2018-12-16 ENCOUNTER — Telehealth: Payer: Self-pay | Admitting: *Deleted

## 2018-12-16 ENCOUNTER — Other Ambulatory Visit: Payer: Self-pay | Admitting: Pain Medicine

## 2018-12-16 NOTE — Telephone Encounter (Signed)
Spoke with Dr. Dossie Arbour to clarify dose. Patient informed.

## 2019-01-13 NOTE — Progress Notes (Signed)
Disclaimer: In compliance with Federal Rules mandating open notes, implemented by the [employer] Act, these notes are made available to patients through the electronic medical record. Information contained herein reflects the providers review of information obtained during the pre-charting review of records, as well as notes taken during the patients appointment.  Although all data contained herein was reviewed by the provider, unless specifically stated, due to time constrains, not all of it was discussed with the patient during the appointment. Specific medical language and abbreviations used within medical records is meant to communicate precise data to individuals trained to interpret such data.  Warning: These encounter notes are not meant to serve the purpose of providing patients with clear and concise information on their conditions, treatment plan, or specific risks and possible complications. Such information is provided to patients under the encounter's "Patient Information" section. Interpretation of the information contained herein should be left to individuals with the necessary training to understand the data.  Pain Management Virtual Encounter Note - Virtual Visit via Telephone Telehealth (real-time audio visits between healthcare provider and patient).   Patient's Phone No. & Preferred Pharmacy:  [phone] (home); [phone] (mobile); (Preferred) [phone] [email or web address]  [id number] [place], [facility] - [address] [place] [address] Phone: [phone] Fax: [phone]    Pre-screening note:  Our staff contacted Ms. Mcclain and offered her an "in person", "face-to-face" appointment versus a telephone encounter. She indicated preferring the telephone encounter, at this time.   Reason for Virtual Visit: COVID-19*  Social distancing based on CDC and AMA recommendations.   I contacted Rusty Aus on  01/14/2019 via telephone.      I clearly identified myself as Oswaldo Done, MD. I verified that I was speaking with the correct person using two identifiers (Name: EMILI MCLOUGHLIN, and date of birth: 08/06/1955).  Advanced Informed Consent I sought verbal advanced consent from Rusty Aus for virtual visit interactions. I informed Ms. Adami of possible security and privacy concerns, risks, and limitations associated with providing "not-in-person" medical evaluation and management services. I also informed Ms. Skilton of the availability of "in-person" appointments. Finally, I informed her that there would be a charge for the virtual visit and that she could be  personally, fully or partially, financially responsible for it. Ms. Doell expressed understanding and agreed to proceed.   Historic Elements   Ms. ASHLIN KREPS is a 63 y.o. year old, female patient evaluated today after her last encounter by our practice on 12/16/2018. Ms. Shetley  has a past medical history of Anxiety, Asthma, Depression, Hyperlipidemia, Hypertension, and IBS (irritable bowel syndrome). She also  has a past surgical history that includes Oophorectomy; Exploratory laparotomy; Foot surgery; Abdominal hysterectomy; Colonoscopy; Tonsillectomy; Axillary lymph node biopsy (Right, 08/03/2014); Kyphoplasty (N/A, 12/10/2014); and Breast biopsy (Left, 10/07/2018). Ms. Cayson has a current medication list which includes the following prescription(s): biotin, black elderberry, black pepper-turmeric, calcium carbonate-vitamin d, cholecalciferol, duloxetine, gabapentin, levocetirizine, lidocaine, losartan, meloxicam, metoprolol tartrate, multivitamin, omega-3 fatty acids, vitamin c, magnesium oxide, metoprolol succinate, and omeprazole. She  reports that she is a non-smoker but has been exposed to tobacco smoke. She has never used smokeless tobacco. She reports current alcohol use. She reports that she does not use  drugs. Ms. Beretta is allergic to indapamide.   HPI  Today, she is being contacted for medication management.  The patient indicates doing well with the current medication regimen. No adverse reactions or side effects reported to the  medications.  The patient indicates that the gabapentin is working great for her and she has improved significantly compared to how she was before she came into our practice.  She also indicates that the omeprazole is working well and keeping her gastroesophageal reflux disease under control with the assistance of the magnesium.  At this point the plan is to continue on the current regimen.  Pharmacotherapy Assessment  Analgesic: No opioid analgesics from our practice. MME/day:0 mg/day   Monitoring: Pharmacotherapy: No side-effects or adverse reactions reported. [place] PMP: PDMP reviewed during this encounter.       Compliance: No problems identified. Effectiveness: Clinically acceptable. Plan: Refer to "POC".  UDS:  Summary  Date Value Ref Range Status  12/10/2017 FINAL  Final    Comment:    ==================================================================== TOXASSURE COMP DRUG ANALYSIS,UR ==================================================================== Test                             Result       Flag       Units Drug Present and Declared for Prescription Verification   Cyclobenzaprine                PRESENT      EXPECTED   Amitriptyline                  PRESENT      EXPECTED   Metoprolol                     PRESENT      EXPECTED Drug Absent but Declared for Prescription Verification   Desmethylcyclobenzaprine       Not Detected UNEXPECTED   Duloxetine                     Not Detected UNEXPECTED ==================================================================== Test                      Result    Flag   Units      Ref Range   Creatinine              28               mg/dL       >=20 ==================================================================== Declared Medications:  The flagging and interpretation on this report are based on the  following declared medications.  Unexpected results may arise from  inaccuracies in the declared medications.  **Note: The testing scope of this panel includes these medications:  Amitriptyline  Cyclobenzaprine  Duloxetine  Metoprolol  **Note: The testing scope of this panel does not include following  reported medications:  Cholecalciferol  Hydrochlorothiazide  Levocetirizine  Losartan (Losartan Potassium)  Magnesium  Meloxicam  Multivitamin  Supplement  Supplement (Omega-3)  Vitamin B (Biotin)  Vitamin C ==================================================================== For clinical consultation, please call [phone]. ====================================================================    Laboratory Chemistry Profile (12 mo)  Renal: No results found for requested labs within last 8760 hours.  Lab Results  Component Value Date   GFRAA 81 01/01/2018   GFRNONAA 70 01/01/2018   Hepatic: No results found for requested labs within last 8760 hours. Lab Results  Component Value Date   AST 26 01/01/2018   ALT 42 08/10/2011   Other: No results found for requested labs within last 8760 hours. Note: Above Lab results reviewed.  Imaging  DG BONE DENSITY (DXA) EXAM: DUAL X-RAY ABSORPTIOMETRY (DXA) FOR BONE MINERAL DENSITY  IMPRESSION:  Dear Dr Ronny Flurry,  Your patient NATALIYAH PACKHAM completed a FRAX assessment on 11/24/2018 using the Lunar iDXA DXA System (analysis version: 14.10) manufactured by [employer]. The following summarizes the results of our evaluation.  PATIENT BIOGRAPHICAL: Name: Payslie, Mccaig Patient ID: [id number] Birth Date: 31-Jul-1955 Height:    62.0 in. Gender:     Female    [age]       Weight:    140.7 lbs. Ethnicity:  White                            Exam Date:  11/24/2018  FRAX* RESULTS:  (version: 3.5) 10-year Probability of Fracture1 Major Osteoporotic Fracture2 Hip Fracture 13.9% 1.2% Population: [place] (Caucasian) Risk Factors: History of Fracture (Adult)  Based on Femur (Right) Neck BMD  1 -The 10-year probability of fracture may be lower than reported if the patient has received treatment. 2 -Major Osteoporotic Fracture: Clinical Spine, Forearm, Hip or Shoulder  *FRAX is a Armed forces logistics/support/administrative officer of the [employer] for Metabolic Bone Disease, a World Science writer (WHO) [employer].  ASSESSMENT: The probability of a major osteoporotic fracture is 13.9% within the next ten years.  The probability of a hip fracture is 1.2% within the next ten years.  .  Technologist: SCE  PATIENT BIOGRAPHICAL: Name: Sameeha, Rockefeller Patient ID: [id number] Birth Date: Apr 02, 1955 Height: 62.0 in. Gender: Female Exam Date: 11/24/2018 Weight: 140.7 lbs. Indications: Caucasian, History of Fracture (Adult), Hysterectomy, Oophorectomy Unilateral, Postmenopausal, Surgical Induced Menopause Fractures: Spine Treatments: calcium w/ vit D, Gabapentin, Multi-Vitamin, Vitamin D  ASSESSMENT:  The BMD measured at Femur Total Right is 0.849 g/cm2 with a T-score of -1.3. This patient is considered osteopenic according to World Health Organization [facility]) criteria. L1 was excluded due to surgery.  Site Region Measured Measured WHO Young Adult BMD Date       Age      Classification T-score AP Spine L2-L4 11/24/[age] 64.6 Normal -0.7 1.134 g/cm2  DualFemur Total Right 11/24/2018 64.6 Osteopenia -1.3 0.849 g/cm2  World Health Organization [facility]) criteria for post-menopausal, Caucasian Women: Normal:       T-score at or above -1 SD Osteopenia:   T-score between -1 and -2.5 SD Osteoporosis: T-score at or below -2.5 SD  RECOMMENDATIONS:  1. All patients should optimize calcium and vitamin D intake. 2. Consider  FDA-approved medical therapies in postmenopausal women and men aged 19 years and older, based on the following: a. A hip or vertebral(clinical or morphometric) fracture b. T-score < -2.5 at the femoral neck or spine after appropriate evaluation to exclude secondary causes c. Low bone mass (T-score between -1.0 and -2.5 at the femoral neck or spine) and a 10-year probability of a hip fracture > 3% or a 10-year probability of a major osteoporosis-related fracture > 20% based on the [place]-adapted WHO algorithm d. Clinician judgment and/or patient preferences may indicate treatment for people with 10-year fracture probabilities above or below these levels  FOLLOW-UP:  People with diagnosed cases of osteoporosis or at high risk for fracture should have regular bone mineral density tests. For patients eligible for Medicare, routine testing is allowed once every 2 years. The testing frequency can be increased to one year for patients who have rapidly progressing disease, those who are receiving or discontinuing medical therapy to restore bone mass, or have additional risk factors.  I have reviewed this report, and  agree with the above findings.  [facility] Radiology  Electronically Signed   By: Charlett Nose M.D.   On: 11/24/2018 11:24   Assessment  The primary encounter diagnosis was Chronic pain syndrome. Diagnoses of Chronic low back pain (Primary Area of Pain) (Left) w/o sciatica, Thoracic facet syndrome (Left), Lumbar facet joint syndrome (Left), Traumatic compression fracture of L1 lumbar vertebra, sequela (2016), Neurogenic pain, and Gastroesophageal reflux disease without esophagitis were also pertinent to this visit.  Plan of Care  Problem-specific:  No problem-specific Assessment & Plan notes found for this encounter.  I have discontinued Scherrie Bateman. Asencio's Magnesium, gabapentin, and esomeprazole. I have also changed her gabapentin. Additionally, I am having her start on  Omeprazole and Magnesium Oxide. Lastly, I am having her maintain her vitamin C, Omega-3 Fatty Acids (FISH OIL PO), losartan, metoprolol succinate, multivitamin, levocetirizine, Biotin, Cholecalciferol, Black Pepper-Turmeric, DULoxetine, lidocaine, meloxicam, BLACK ELDERBERRY PO, Calcium Carbonate-Vitamin D (CALCIUM 500 + D PO), and metoprolol tartrate.  Pharmacotherapy (Medications Ordered): Meds ordered this encounter  Medications  . gabapentin (NEURONTIN) 300 MG capsule    Sig: Take 1 capsule (300 mg total) by mouth 3 (three) times daily AND 2-3 capsules (600-900 mg total) at bedtime.    Dispense:  180 capsule    Refill:  5    Fill one day early if pharmacy is closed on scheduled refill date. May substitute for generic if available.  . Omeprazole 20 MG TBEC    Sig: Take 1 tablet (20 mg total) by mouth at bedtime.    Dispense:  90 tablet    Refill:  3    Fill one day early if pharmacy is closed on scheduled refill date. May substitute for generic if available.  . Magnesium Oxide 500 MG CAPS    Sig: Take 1 capsule (500 mg total) by mouth 2 (two) times daily at 8 am and 10 pm.    Dispense:  180 capsule    Refill:  3    Fill one day early if pharmacy is closed on scheduled refill date. May substitute for generic if available.   Orders:  No orders of the defined types were placed in this encounter.  Follow-up plan:   Return in about 3 months (around 04/14/2019) for (VV), (MM).      Interventional management options: Considering:   Diagnostic left L1-2LESI #1   Palliative PRN treatment(s):   Palliative left thoracolumbar facet medial branch (T12, L1, L2, & L3) block #3  Palliative left thoracolumbar (T12, L1, L2, & L3) medial branch facet RFA #2 (last done 09/30/2018)    Recent Visits Date Type Provider Dept  12/03/18 Telemedicine Delano Metz, MD [facility]  11/03/18 Office Visit Delano Metz, MD [facility]  Showing recent visits within past 90  days and meeting all other requirements   Today's Visits Date Type Provider Dept  01/14/19 Telemedicine Delano Metz, MD [facility]  Showing today's visits and meeting all other requirements   Future Appointments No visits were found meeting these conditions.  Showing future appointments within next 90 days and meeting all other requirements   I discussed the assessment and treatment plan with the patient. The patient was provided an opportunity to ask questions and all were answered. The patient agreed with the plan and demonstrated an understanding of the instructions.  Patient advised to call back or seek an in-person evaluation if the symptoms or condition worsens.  Total duration of non-face-to-face encounter: 15 minutes.  Note by: Para March  Erlinda HongA Sowmya Partridge, MD Date: 01/14/2019; Time: 9:34 AM  Note: This dictation was prepared with Dragon dictation. Any transcriptional errors that may result from this process are unintentional.

## 2019-01-14 ENCOUNTER — Other Ambulatory Visit: Payer: Self-pay

## 2019-01-14 ENCOUNTER — Ambulatory Visit: Payer: Medicare Other | Attending: Pain Medicine | Admitting: Pain Medicine

## 2019-01-14 DIAGNOSIS — M47816 Spondylosis without myelopathy or radiculopathy, lumbar region: Secondary | ICD-10-CM

## 2019-01-14 DIAGNOSIS — M545 Low back pain, unspecified: Secondary | ICD-10-CM

## 2019-01-14 DIAGNOSIS — G894 Chronic pain syndrome: Secondary | ICD-10-CM

## 2019-01-14 DIAGNOSIS — M47894 Other spondylosis, thoracic region: Secondary | ICD-10-CM

## 2019-01-14 DIAGNOSIS — G8929 Other chronic pain: Secondary | ICD-10-CM

## 2019-01-14 DIAGNOSIS — M47896 Other spondylosis, lumbar region: Secondary | ICD-10-CM

## 2019-01-14 DIAGNOSIS — M792 Neuralgia and neuritis, unspecified: Secondary | ICD-10-CM

## 2019-01-14 DIAGNOSIS — S32010S Wedge compression fracture of first lumbar vertebra, sequela: Secondary | ICD-10-CM

## 2019-01-14 DIAGNOSIS — K219 Gastro-esophageal reflux disease without esophagitis: Secondary | ICD-10-CM

## 2019-01-14 DIAGNOSIS — X58XXXS Exposure to other specified factors, sequela: Secondary | ICD-10-CM

## 2019-01-14 MED ORDER — MAGNESIUM OXIDE -MG SUPPLEMENT 500 MG PO CAPS: 1.0000 | ORAL_CAPSULE | Freq: Two times a day (BID) | ORAL | 3 refills | Status: DC

## 2019-01-14 MED ORDER — GABAPENTIN 300 MG PO CAPS: ORAL_CAPSULE | ORAL | 5 refills | Status: DC

## 2019-01-14 MED ORDER — OMEPRAZOLE 20 MG PO TBEC: 20.0000 mg | DELAYED_RELEASE_TABLET | Freq: Every day | ORAL | 3 refills | Status: DC

## 2019-01-28 ENCOUNTER — Telehealth: Payer: Self-pay | Admitting: Pain Medicine

## 2019-01-28 NOTE — Telephone Encounter (Signed)
Clarified with patient.  No further questions.

## 2019-01-28 NOTE — Telephone Encounter (Signed)
Patient would like to verify what scripts were called in please call patient asap.

## 2019-02-19 ENCOUNTER — Telehealth: Payer: Self-pay | Admitting: Pain Medicine

## 2019-02-19 NOTE — Telephone Encounter (Signed)
Patient advised that she may discontinue Meloxicam and see if she continues to have good pain relief.

## 2019-02-19 NOTE — Telephone Encounter (Signed)
Pt called and stated that she is doing really well on gabapentin and wants to know if she should still be taking meloxicam

## 2019-04-01 ENCOUNTER — Other Ambulatory Visit: Payer: Self-pay | Admitting: Family Medicine

## 2019-04-01 DIAGNOSIS — N6489 Other specified disorders of breast: Secondary | ICD-10-CM

## 2019-04-12 NOTE — Progress Notes (Signed)
Patient: Angela Espinoza  Service Category: E/M  Provider: Gaspar Cola, MD  DOB: 06/07/1955  DOS: 04/13/2019  Location: Office  MRN: [id number]  Setting: Ambulatory outpatient  Referring Provider: Gennette Pac, FNP  Type: Established Patient  Specialty: Interventional Pain Management  PCP: Angela Pac, FNP  Location: Remote location  Delivery: TeleHealth     Virtual Encounter - Pain Management PROVIDER NOTE: Information contained herein reflects review and annotations entered in association with encounter. Interpretation of such information and data should be left to medically-trained personnel. Information provided to patient can be located elsewhere in the medical record under "Patient Instructions". Document created using STT-dictation technology, any transcriptional errors that may result from process are unintentional.    Contact & Pharmacy Preferred: [place]: [phone] (home) Mobile: [phone] (mobile) E-mail: [email or web address]  [id number] [id number]: [phone]: [phone]  Pre-screening  Angela Espinoza offered "in-person" vs "virtual" encounter. She indicated preferring virtual for this encounter.   Reason COVID-19*  Social distancing based on CDC and AMA recommendations.   I contacted BVladimir Espinoza 04/13/2019 via telephone.      I clearly identified myself as FGaspar Cola MD. I verified that I was speaking with the correct person using two identifiers (Name: BGRECIA LYNK and date of birth: 2Oct 01, 1957.  Consent I sought verbal advanced consent from BVladimir Creeksfor virtual visit interactions. I informed [name] of possible security and privacy concerns, risks, and limitations associated with providing "not-in-person" medical evaluation and management services. I also informed [name] of the availability of  "in-person" appointments. Finally, I informed her that there would be a charge for the virtual visit and that she could be  personally, fully or partially, financially responsible for it. Ms. CMccafferyexpressed understanding and agreed to proceed.   Historic Elements   Ms. BBRIYAH WHEELWRIGHTis a 64y.o. year old, female patient evaluated today after her last contact with our practice on 02/19/2019. Ms. CSuto has a past medical history of Anxiety, Asthma, Depression, Hyperlipidemia, Hypertension, and IBS (irritable bowel syndrome). She also  has a past surgical history that includes Oophorectomy; Exploratory laparotomy; Foot surgery; Abdominal hysterectomy; Colonoscopy; Tonsillectomy; Axillary lymph node biopsy (Right, 08/03/2014); Kyphoplasty (N/A, 12/10/2014); and Breast biopsy (Left, 10/07/2018). Ms. CColmenareshas a current medication list which includes the following prescription(s): biotin, black elderberry, black pepper-turmeric, calcium carbonate-vitamin d, cholecalciferol, duloxetine, gabapentin, levocetirizine, losartan, meloxicam, metoprolol succinate, multivitamin, omega-3 fatty acids, and vitamin c. She  reports that she is a non-smoker but has been exposed to tobacco smoke. She has never used smokeless tobacco. She reports current alcohol use. She reports that she does not use drugs. Ms. CScheidereris allergic to indapamide.   HPI  Today, she is being contacted for medication management. Patient not using omeprazole or magnesium. Does not need Neurontin refill until 07/13/19.  The patient indicates having stopped them the magnesium that I prescribed because it gave her diarrhea.  However, she says that she is still taking some magnesium that she gets over-the-counter and although she indicated to the nurses that she is not taking the omeprazole, she just mention to me that she continues to take some Prilosec OTC, as needed for her heartburn.  She also mentioned that she feels her radiofrequency  may be wearing off and she was wondering if she had to have it repeated.  She is still very undecided on that and  therefore what I have done is I have provided her with a as needed order for a diagnostic left-sided T12-L3 medial branch block order.  We have agreed that as soon as the pain worsens, she will be calling to come in for the diagnostic T12-L3 medial branch block and if this improves her pain, it would mean that the radiofrequency ablation that we had done on 09/30/2018 has worn off and therefore will need to reschedule it again.  For the time being, she indicates that she is doing okay and she prefers to wait until things worse than before she comes in for any further interventional procedures.  Pharmacotherapy Assessment  Analgesic: No opioid analgesics from our practice. MME/day:0 mg/day   Monitoring: [place] PMP: PDMP reviewed during this encounter.       Pharmacotherapy: No side-effects or adverse reactions reported. Compliance: No problems identified. Effectiveness: Clinically acceptable. Plan: Refer to "POC".  UDS:  Summary  Date Value Ref Range Status  12/10/2017 FINAL  Final    Comment:    ==================================================================== TOXASSURE COMP DRUG ANALYSIS,UR ==================================================================== Test                             Result       Flag       Units Drug Present and Declared for Prescription Verification   Cyclobenzaprine                PRESENT      EXPECTED   Amitriptyline                  PRESENT      EXPECTED   Metoprolol                     PRESENT      EXPECTED Drug Absent but Declared for Prescription Verification   Desmethylcyclobenzaprine       Not Detected UNEXPECTED   Duloxetine                     Not Detected UNEXPECTED ==================================================================== Test                      Result    Flag   Units      Ref Range   Creatinine              28                mg/dL      >=20 ==================================================================== Declared Medications:  The flagging and interpretation on this report are based on the  following declared medications.  Unexpected results may arise from  inaccuracies in the declared medications.  **Note: The testing scope of this panel includes these medications:  Amitriptyline  Cyclobenzaprine  Duloxetine  Metoprolol  **Note: The testing scope of this panel does not include following  reported medications:  Cholecalciferol  Hydrochlorothiazide  Levocetirizine  Losartan (Losartan Potassium)  Magnesium  Meloxicam  Multivitamin  Supplement  Supplement (Omega-3)  Vitamin B (Biotin)  Vitamin C ==================================================================== For clinical consultation, please call [phone]. ====================================================================    Laboratory Chemistry Profile   Renal Lab Results  Component Value Date   BUN 10 01/01/2018   CREATININE 0.88 01/01/2018   BCR 11 (L) 01/01/2018   GFRAA 81 01/01/2018   GFRNONAA 70 01/01/2018    Hepatic Lab Results  Component Value Date   AST 26 01/01/2018   ALT  42 08/10/2011   ALBUMIN 4.6 01/01/2018   ALKPHOS 73 01/01/2018    Electrolytes Lab Results  Component Value Date   NA 136 01/01/2018   K 4.5 01/01/2018   CL 98 01/01/2018   CALCIUM 9.9 01/01/2018   MG 2.3 12/10/2017   PHOS 1.6 (L) 08/10/2011    Bone Lab Results  Component Value Date   25OHVITD1 40 01/01/2018   25OHVITD2 <1.0 01/01/2018   25OHVITD3 40 01/01/2018    Inflammation (CRP: Acute Phase) (ESR: Chronic Phase) Lab Results  Component Value Date   CRP 1 12/10/2017   ESRSEDRATE 3 12/10/2017      Note: Above Lab results reviewed.  Imaging  DG BONE DENSITY (DXA) EXAM: DUAL X-RAY ABSORPTIOMETRY (DXA) FOR BONE MINERAL DENSITY  IMPRESSION: Dear Dr Illene Labrador,  Your patient JONNELLE LAWNICZAK completed a FRAX assessment  on 11/24/2018 using the Julian (analysis version: 14.10) manufactured by [employer]. The following summarizes the results of our evaluation.  PATIENT BIOGRAPHICAL: Name: Margan, Elias Patient ID: [id number] Birth Date: [birth year] Height:    62.0 in. Gender:     Female    [age]       Weight:    140.7 lbs. Ethnicity:  White                            Exam Date: 11/24/2018  FRAX* RESULTS:  (version: 3.5) 10-year Probability of Fracture1 Major Osteoporotic Fracture2 Hip Fracture 13.9% 1.2% Population: [place] (Caucasian) Risk Factors: History of Fracture (Adult)  Based on Femur (Right) Neck BMD  1 -The 10-year probability of fracture may be lower than reported if the patient has received treatment. 2 -Major Osteoporotic Fracture: Clinical Spine, Forearm, Hip or Shoulder  *FRAX is a Materials engineer of the [employer] for Metabolic Bone Disease, a [place] (WHO) [facility].  ASSESSMENT: The probability of a major osteoporotic fracture is 13.9% within the next ten years.  The probability of a hip fracture is 1.2% within the next ten years.  .  Technologist: SCE  PATIENT BIOGRAPHICAL: Name: Madysun, Thall Patient ID: [id number] Birth Date: 1955-10-16 Height: 62.0 in. Gender: Female Exam Date: 11/24/2018 Weight: 140.7 lbs. Indications: Caucasian, History of Fracture (Adult), Hysterectomy, Oophorectomy Unilateral, Postmenopausal, Surgical Induced Menopause Fractures: Spine Treatments: calcium w/ vit D, Gabapentin, Multi-Vitamin, Vitamin D  ASSESSMENT:  The BMD measured at Femur Total Right is 0.849 g/cm2 with a T-score of -1.3. This patient is considered osteopenic according to [facility]) criteria. L1 was excluded due to surgery.  Site Region Measured Measured WHO Young Adult BMD Date       Age      Classification T-score AP Spine L2-L4 11/24/2018 64.6  Normal -0.7 1.134 g/cm2  DualFemur Total Right 11/24/2018 64.6 Osteopenia -1.3 0.849 g/cm2  World Health Organization [facility] criteria for post-menopausal, Caucasian Women: Normal:       T-score at or above -1 SD Osteopenia:   T-score between -1 and -2.5 SD Osteoporosis: T-score at or below -2.5 SD  RECOMMENDATIONS:  1. All patients should optimize calcium and vitamin D intake. 2. Consider FDA-approved medical therapies in postmenopausal women and men aged 43 years and older, based on the following: a. A hip or vertebral(clinical or morphometric) fracture b. T-score < -2.5 at the femoral neck or spine after appropriate evaluation to exclude secondary causes c. Low bone mass (T-score  between -1.0 and -2.5 at the femoral neck or spine) and a 10-year probability of a hip fracture > 3% or a 10-year probability of a major osteoporosis-related fracture > 20% based on the [place]-adapted WHO algorithm d. Clinician judgment and/or patient preferences may indicate treatment for people with 10-year fracture probabilities above or below these levels  FOLLOW-UP:  People with diagnosed cases of osteoporosis or at high risk for fracture should have regular bone mineral density tests. For patients eligible for Medicare, routine testing is allowed once every 2 years. The testing frequency can be increased to one year for patients who have rapidly progressing disease, those who are receiving or discontinuing medical therapy to restore bone mass, or have additional risk factors.  I have reviewed this report, and agree with the above findings.  [facility]  Electronically Signed   By: Rolm Baptise M.D.   On: 11/24/2018 11:24  Assessment  The primary encounter diagnosis was Chronic pain syndrome. Diagnoses of Chronic low back pain (Primary Area of Pain) (Left) w/o sciatica, Thoracic facet syndrome (Left), Lumbar facet joint syndrome (Left), and Traumatic compression fracture of L1 lumbar  vertebra, sequela (2016) were also pertinent to this visit.  Plan of Care  Problem-specific:  No problem-specific Assessment & Plan notes found for this encounter.  Ms. ALMAS RAKE has a current medication list which includes the following long-term medication(s): calcium carbonate-vitamin d, gabapentin, levocetirizine, losartan, and metoprolol succinate.  Pharmacotherapy (Medications Ordered): No orders of the defined types were placed in this encounter.  Orders:  No orders of the defined types were placed in this encounter.  Follow-up plan:   Return in about 13 weeks (around 07/13/2019) for (VV), (MM), in addition, PRN Procedure(s): (L) T12-L3 FCT BLK, (w/ Sedation).      Interventional management options: Considering:   Diagnostic left L1-2LESI #1   Palliative PRN treatment(s):   Palliative left thoracolumbar facet medial branch (T12, L1, L2, & L3) block #3  Palliative left thoracolumbar (T12, L1, L2, & L3) medial branch facet RFA #2 (last done 09/30/2018)     Recent Visits Date Type Provider Dept  01/14/19 Telemedicine Milinda Pointer, MD [facility]  Showing recent visits within past 90 days and meeting all other requirements   Today's Visits Date Type Provider Dept  04/13/19 Telemedicine Milinda Pointer, MD [facility]  Showing today's visits and meeting all other requirements   Future Appointments No visits were found meeting these conditions.  Showing future appointments within next 90 days and meeting all other requirements   I discussed the assessment and treatment plan with the patient. The patient was provided an opportunity to ask questions and all were answered. The patient agreed with the plan and demonstrated an understanding of the instructions.  Patient advised to call back or seek an in-person evaluation if the symptoms or condition worsens.  Duration of encounter: 16 minutes.  Note by: Gaspar Cola, MD Date:  04/13/2019; Time: 10:09 AM

## 2019-04-13 ENCOUNTER — Other Ambulatory Visit: Payer: Self-pay

## 2019-04-13 ENCOUNTER — Ambulatory Visit: Payer: Medicare HMO | Attending: Pain Medicine | Admitting: Pain Medicine

## 2019-04-13 DIAGNOSIS — M47816 Spondylosis without myelopathy or radiculopathy, lumbar region: Secondary | ICD-10-CM

## 2019-04-13 DIAGNOSIS — M545 Low back pain, unspecified: Secondary | ICD-10-CM

## 2019-04-13 DIAGNOSIS — S32010S Wedge compression fracture of first lumbar vertebra, sequela: Secondary | ICD-10-CM

## 2019-04-13 DIAGNOSIS — G894 Chronic pain syndrome: Secondary | ICD-10-CM

## 2019-04-13 DIAGNOSIS — M47894 Other spondylosis, thoracic region: Secondary | ICD-10-CM | POA: Diagnosis not present

## 2019-04-13 DIAGNOSIS — G8929 Other chronic pain: Secondary | ICD-10-CM

## 2019-05-04 ENCOUNTER — Telehealth: Payer: Self-pay | Admitting: *Deleted

## 2019-05-05 NOTE — Telephone Encounter (Signed)
Please call patient and schedule for facet block with sedation. Thank you. Pre procedure instructions given.

## 2019-05-05 NOTE — Telephone Encounter (Signed)
No answer. LVM ?

## 2019-05-06 ENCOUNTER — Ambulatory Visit
Admission: RE | Admit: 2019-05-06 | Discharge: 2019-05-06 | Disposition: A | Discharge status: Home / Self Care | Payer: Medicare HMO | Source: Ambulatory Visit | Attending: Family Medicine | Admitting: Family Medicine

## 2019-05-06 DIAGNOSIS — N6489 Other specified disorders of breast: Secondary | ICD-10-CM | POA: Diagnosis present

## 2019-05-19 ENCOUNTER — Other Ambulatory Visit: Payer: Self-pay | Admitting: Family Medicine

## 2019-05-19 DIAGNOSIS — N6489 Other specified disorders of breast: Secondary | ICD-10-CM

## 2019-05-19 DIAGNOSIS — Z1231 Encounter for screening mammogram for malignant neoplasm of breast: Secondary | ICD-10-CM

## 2019-05-19 DIAGNOSIS — R928 Other abnormal and inconclusive findings on diagnostic imaging of breast: Secondary | ICD-10-CM

## 2019-05-28 ENCOUNTER — Ambulatory Visit
Admission: RE | Admit: 2019-05-28 | Discharge: 2019-05-28 | Disposition: A | Discharge status: Home / Self Care | Payer: Medicare HMO | Source: Ambulatory Visit | Attending: Pain Medicine | Admitting: Pain Medicine

## 2019-05-28 ENCOUNTER — Ambulatory Visit (HOSPITAL_BASED_OUTPATIENT_CLINIC_OR_DEPARTMENT_OTHER): Payer: Medicare HMO | Admitting: Pain Medicine

## 2019-05-28 ENCOUNTER — Encounter: Payer: Self-pay | Admitting: Pain Medicine

## 2019-05-28 ENCOUNTER — Other Ambulatory Visit: Payer: Self-pay

## 2019-05-28 VITALS — BP 110/60 | HR 63 | Temp 98.2°F | Resp 20 | Ht 62.0 in | Wt 145.0 lb

## 2019-05-28 DIAGNOSIS — M47816 Spondylosis without myelopathy or radiculopathy, lumbar region: Secondary | ICD-10-CM

## 2019-05-28 DIAGNOSIS — G8929 Other chronic pain: Secondary | ICD-10-CM

## 2019-05-28 DIAGNOSIS — M47894 Other spondylosis, thoracic region: Secondary | ICD-10-CM | POA: Diagnosis present

## 2019-05-28 DIAGNOSIS — M545 Low back pain: Secondary | ICD-10-CM | POA: Diagnosis present

## 2019-05-28 DIAGNOSIS — M5137 Other intervertebral disc degeneration, lumbosacral region: Secondary | ICD-10-CM | POA: Diagnosis present

## 2019-05-28 DIAGNOSIS — M47817 Spondylosis without myelopathy or radiculopathy, lumbosacral region: Secondary | ICD-10-CM

## 2019-05-28 MED ORDER — MIDAZOLAM HCL 5 MG/5ML IJ SOLN
1.0000 mg | INTRAMUSCULAR | Status: DC | PRN
  Administered 2019-05-28: 1 mg via INTRAVENOUS
  Filled 2019-05-28: qty 5

## 2019-05-28 MED ORDER — LIDOCAINE HCL 2 % IJ SOLN
20.0000 mL | Freq: Once | INTRAMUSCULAR | Status: DC
  Filled 2019-05-28: qty 20

## 2019-05-28 MED ORDER — TRIAMCINOLONE ACETONIDE 40 MG/ML IJ SUSP
40.0000 mg | Freq: Once | INTRAMUSCULAR | Status: AC
  Administered 2019-05-28: 40 mg
  Filled 2019-05-28: qty 1

## 2019-05-28 MED ORDER — FENTANYL CITRATE (PF) 100 MCG/2ML IJ SOLN
25.0000 ug | INTRAMUSCULAR | Status: DC | PRN
  Administered 2019-05-28: 50 ug via INTRAVENOUS
  Filled 2019-05-28: qty 2

## 2019-05-28 MED ORDER — ROPIVACAINE HCL 2 MG/ML IJ SOLN
9.0000 mL | Freq: Once | INTRAMUSCULAR | Status: DC
  Filled 2019-05-28: qty 10

## 2019-05-28 MED ORDER — LACTATED RINGERS IV SOLN
1000.0000 mL | Freq: Once | INTRAVENOUS | Status: AC
  Administered 2019-05-28: 1000 mL via INTRAVENOUS

## 2019-05-28 NOTE — Progress Notes (Signed)
PROVIDER NOTE: Information contained herein reflects review and annotations entered in association with encounter. Interpretation of such information and data should be left to medically-trained personnel. Information provided to patient can be located elsewhere in the medical record under "Patient Instructions". Document created using STT-dictation technology, any transcriptional errors that may result from process are unintentional.    Patient: Angela Espinoza  Service Category: Procedure  Provider: Oswaldo Done, MD  DOB: [birth year]  DOS: 05/28/2019  Location: [facility]  MRN: [id number]  Setting: Ambulatory - outpatient  Referring Provider: Delano Metz, MD  Type: Established Patient  Specialty: Interventional Pain Management  PCP: Toy Cookey, FNP   Primary Reason for Visit: Interventional Pain Management Treatment. CC: Back Pain (lower left)  Procedure:          Anesthesia, Analgesia, Anxiolysis:  Type: Lumbar Facet, Medial Branch Block(s) #1  Primary Purpose: Diagnostic Region: Posterolateral Lumbosacral Spine Level: L2, L3, L4, L5, & S1 Medial Branch Level(s). Injecting these levels blocks the L3-4, L4-5, and L5-S1 lumbar facet joints. Laterality: Left  Type: Moderate (Conscious) Sedation combined with Local Anesthesia Indication(s): Analgesia and Anxiety Route: Intravenous (IV) IV Access: Secured Sedation: Meaningful verbal contact was maintained at all times during the procedure  Local Anesthetic: Lidocaine 1-2%  Position: Prone   Indications: 1. Lumbar facet joint syndrome (Left)   2. Spondylosis without myelopathy or radiculopathy, lumbosacral region   3. DDD (degenerative disc disease), lumbosacral   4. Chronic low back pain (Primary Area of Pain) (Left) w/o sciatica    Pain Score: Pre-procedure: 8 /10 Post-procedure: 0-No pain/10   Pre-op Assessment:  Angela Espinoza is a 64 y.o. (year old), female patient, seen today for  interventional treatment. She  has a past surgical history that includes Oophorectomy; Exploratory laparotomy; Foot surgery; Abdominal hysterectomy; Colonoscopy; Tonsillectomy; Axillary lymph node biopsy (Right, 08/03/2014); Kyphoplasty (N/A, 12/10/2014); and Breast biopsy (Left, 10/07/2018). Angela Espinoza has a current medication list which includes the following prescription(s): biotin, black elderberry, black pepper-turmeric, calcium carbonate-vitamin d, cholecalciferol, duloxetine, gabapentin, levocetirizine, losartan, meloxicam, metoprolol succinate, multivitamin, omega-3 fatty acids, and vitamin c, and the following Facility-Administered Medications: fentanyl, lidocaine, midazolam, and ropivacaine (pf) 2 mg/ml (0.2%). Her primarily concern today is the Back Pain (lower left)  Initial Vital Signs:  Pulse/HCG Rate: 68ECG Heart Rate: 68 Temp: 98.2 F (36.8 C) Resp: 18 BP: (!) 107/53 SpO2: 99 %  BMI: Estimated body mass index is 26.52 kg/m as calculated from the following:   Height as of this encounter: 5\' 2"  (1.575 m).   Weight as of this encounter: 145 lb (65.8 kg).  Risk Assessment: Allergies: Reviewed. She is allergic to indapamide.  Allergy Precautions: None required Coagulopathies: Reviewed. None identified.  Blood-thinner therapy: None at this time Active Infection(s): Reviewed. None identified. Angela Espinoza is afebrile  Site Confirmation: Angela Espinoza was asked to confirm the procedure and laterality before marking the site Procedure checklist: Completed Consent: Before the procedure and under the influence of no sedative(s), amnesic(s), or anxiolytics, the patient was informed of the treatment options, risks and possible complications. To fulfill our ethical and legal obligations, as recommended by the American Medical Association's Code of Ethics, I have informed the patient of my clinical impression; the nature and purpose of the treatment or procedure; the risks, benefits, and  possible complications of the intervention; the alternatives, including doing nothing; the risk(s) and benefit(s) of the alternative treatment(s) or procedure(s); and the risk(s) and benefit(s) of doing nothing. The patient was provided information about  the general risks and possible complications associated with the procedure. These may include, but are not limited to: failure to achieve desired goals, infection, bleeding, organ or nerve damage, allergic reactions, paralysis, and death. In addition, the patient was informed of those risks and complications associated to Spine-related procedures, such as failure to decrease pain; infection (i.e.: Meningitis, epidural or intraspinal abscess); bleeding (i.e.: epidural hematoma, subarachnoid hemorrhage, or any other type of intraspinal or peri-dural bleeding); organ or nerve damage (i.e.: Any type of peripheral nerve, nerve root, or spinal cord injury) with subsequent damage to sensory, motor, and/or autonomic systems, resulting in permanent pain, numbness, and/or weakness of one or several areas of the body; allergic reactions; (i.e.: anaphylactic reaction); and/or death. Furthermore, the patient was informed of those risks and complications associated with the medications. These include, but are not limited to: allergic reactions (i.e.: anaphylactic or anaphylactoid reaction(s)); adrenal axis suppression; blood sugar elevation that in diabetics may result in ketoacidosis or comma; water retention that in patients with history of congestive heart failure may result in shortness of breath, pulmonary edema, and decompensation with resultant heart failure; weight gain; swelling or edema; medication-induced neural toxicity; particulate matter embolism and blood vessel occlusion with resultant organ, and/or nervous system infarction; and/or aseptic necrosis of one or more joints. Finally, the patient was informed that Medicine is not an exact science; therefore, there  is also the possibility of unforeseen or unpredictable risks and/or possible complications that may result in a catastrophic outcome. The patient indicated having understood very clearly. We have given the patient no guarantees and we have made no promises. Enough time was given to the patient to ask questions, all of which were answered to the patient's satisfaction. Angela Espinoza has indicated that she wanted to continue with the procedure. Attestation: I, the ordering provider, attest that I have discussed with the patient the benefits, risks, side-effects, alternatives, likelihood of achieving goals, and potential problems during recovery for the procedure that I have provided informed consent. Date  Time: 05/28/2019  9:41 AM  Pre-Procedure Preparation:  Monitoring: As per clinic protocol. Respiration, ETCO2, SpO2, BP, heart rate and rhythm monitor placed and checked for adequate function Safety Precautions: Patient was assessed for positional comfort and pressure points before starting the procedure. Time-out: I initiated and conducted the "Time-out" before starting the procedure, as per protocol. The patient was asked to participate by confirming the accuracy of the "Time Out" information. Verification of the correct person, site, and procedure were performed and confirmed by me, the nursing staff, and the patient. "Time-out" conducted as per Joint Commission's Universal Protocol (UP.01.01.01). Time: 1035  Description of Procedure:          Laterality: Left Levels:  L2, L3, L4, L5, & S1 Medial Branch Level(s) Area Prepped: Posterior Lumbosacral Region DuraPrep (Iodine Povacrylex [0.7% available iodine] and Isopropyl Alcohol, 74% w/w) Safety Precautions: Aspiration looking for blood return was conducted prior to all injections. At no point did we inject any substances, as a needle was being advanced. Before injecting, the patient was told to immediately notify me if she was experiencing any new  onset of "ringing in the ears, or metallic taste in the mouth". No attempts were made at seeking any paresthesias. Safe injection practices and needle disposal techniques used. Medications properly checked for expiration dates. SDV (single dose vial) medications used. After the completion of the procedure, all disposable equipment used was discarded in the proper designated medical waste containers. Local Anesthesia: Protocol guidelines were followed.  The patient was positioned over the fluoroscopy table. The area was prepped in the usual manner. The time-out was completed. The target area was identified using fluoroscopy. A 12-in long, straight, sterile hemostat was used with fluoroscopic guidance to locate the targets for each level blocked. Once located, the skin was marked with an approved surgical skin marker. Once all sites were marked, the skin (epidermis, dermis, and hypodermis), as well as deeper tissues (fat, connective tissue and muscle) were infiltrated with a small amount of a short-acting local anesthetic, loaded on a 10cc syringe with a 25G, 1.5-in  Needle. An appropriate amount of time was allowed for local anesthetics to take effect before proceeding to the next step. Local Anesthetic: Lidocaine 2.0% The unused portion of the local anesthetic was discarded in the proper designated containers. Technical explanation of process:  L2 Medial Branch Nerve Block (MBB): The target area for the L2 medial branch is at the junction of the postero-lateral aspect of the superior articular process and the superior, posterior, and medial edge of the transverse process of L3. Under fluoroscopic guidance, a Quincke needle was inserted until contact was made with os over the superior postero-lateral aspect of the pedicular shadow (target area). After negative aspiration for blood, 0.5 mL of the nerve block solution was injected without difficulty or complication. The needle was removed intact. L3 Medial Branch  Nerve Block (MBB): The target area for the L3 medial branch is at the junction of the postero-lateral aspect of the superior articular process and the superior, posterior, and medial edge of the transverse process of L4. Under fluoroscopic guidance, a Quincke needle was inserted until contact was made with os over the superior postero-lateral aspect of the pedicular shadow (target area). After negative aspiration for blood, 0.5 mL of the nerve block solution was injected without difficulty or complication. The needle was removed intact. L4 Medial Branch Nerve Block (MBB): The target area for the L4 medial branch is at the junction of the postero-lateral aspect of the superior articular process and the superior, posterior, and medial edge of the transverse process of L5. Under fluoroscopic guidance, a Quincke needle was inserted until contact was made with os over the superior postero-lateral aspect of the pedicular shadow (target area). After negative aspiration for blood, 0.5 mL of the nerve block solution was injected without difficulty or complication. The needle was removed intact. L5 Medial Branch Nerve Block (MBB): The target area for the L5 medial branch is at the junction of the postero-lateral aspect of the superior articular process and the superior, posterior, and medial edge of the sacral ala. Under fluoroscopic guidance, a Quincke needle was inserted until contact was made with os over the superior postero-lateral aspect of the pedicular shadow (target area). After negative aspiration for blood, 0.5 mL of the nerve block solution was injected without difficulty or complication. The needle was removed intact. S1 Medial Branch Nerve Block (MBB): The target area for the S1 medial branch is at the posterior and inferior 6 o'clock position of the L5-S1 facet joint. Under fluoroscopic guidance, the Quincke needle inserted for the L5 MBB was redirected until contact was made with os over the inferior and  postero aspect of the sacrum, at the 6 o' clock position under the L5-S1 facet joint (Target area). After negative aspiration for blood, 0.5 mL of the nerve block solution was injected without difficulty or complication. The needle was removed intact.  Nerve block solution: 0.2% PF-Ropivacaine + Triamcinolone (40 mg/mL) diluted to a  final concentration of 4 mg of Triamcinolone/mL of Ropivacaine The unused portion of the solution was discarded in the proper designated containers. Procedural Needles: 22-gauge, 3.5-inch, Quincke needles used for all levels.  Once the entire procedure was completed, the treated area was cleaned, making sure to leave some of the prepping solution back to take advantage of its long term bactericidal properties.   Illustration of the posterior view of the lumbar spine and the posterior neural structures. Laminae of L2 through S1 are labeled. DPRL5, dorsal primary ramus of L5; DPRS1, dorsal primary ramus of S1; DPR3, dorsal primary ramus of L3; FJ, facet (zygapophyseal) joint L3-L4; I, inferior articular process of L4; LB1, lateral branch of dorsal primary ramus of L1; IAB, inferior articular branches from L3 medial branch (supplies L4-L5 facet joint); IBP, intermediate branch plexus; MB3, medial branch of dorsal primary ramus of L3; NR3, third lumbar nerve root; S, superior articular process of L5; SAB, superior articular branches from L4 (supplies L4-5 facet joint also); TP3, transverse process of L3.  Vitals:   05/28/19 1040 05/28/19 1050 05/28/19 1100 05/28/19 1110  BP: 103/64 (!) 106/54 (!) 119/57 110/60  Pulse: 65 62 63   Resp: 16 17 (!) 21 20  Temp:  98.4 F (36.9 C)  98.2 F (36.8 C)  TempSrc:      SpO2: 97% 96% 100% 100%  Weight:      Height:         Start Time: 1035 hrs. End Time: 1040 hrs.  Imaging Guidance (Spinal):          Type of Imaging Technique: Fluoroscopy Guidance (Spinal) Indication(s): Assistance in needle guidance and placement for  procedures requiring needle placement in or near specific anatomical locations not easily accessible without such assistance. Exposure Time: Please see nurses notes. Contrast: None used. Fluoroscopic Guidance: I was personally present during the use of fluoroscopy. "Tunnel Vision Technique" used to obtain the best possible view of the target area. Parallax error corrected before commencing the procedure. "Direction-depth-direction" technique used to introduce the needle under continuous pulsed fluoroscopy. Once target was reached, antero-posterior, oblique, and lateral fluoroscopic projection used confirm needle placement in all planes. Images permanently stored in EMR. Interpretation: No contrast injected. I personally interpreted the imaging intraoperatively. Adequate needle placement confirmed in multiple planes. Permanent images saved into the patient's record.  Antibiotic Prophylaxis:   Anti-infectives (From admission, onward)   None     Indication(s): None identified  Post-operative Assessment:  Post-procedure Vital Signs:  Pulse/HCG Rate: 6368 Temp: 98.2 F (36.8 C) Resp: 20 BP: 110/60 SpO2: 100 %  EBL: None  Complications: No immediate post-treatment complications observed by team, or reported by patient.  Note: The patient tolerated the entire procedure well. A repeat set of vitals were taken after the procedure and the patient was kept under observation following institutional policy, for this type of procedure. Post-procedural neurological assessment was performed, showing return to baseline, prior to discharge. The patient was provided with post-procedure discharge instructions, including a section on how to identify potential problems. Should any problems arise concerning this procedure, the patient was given instructions to immediately contact us, at any time, without hesitation. In any case, we plan to contact the patient by telephone for a follow-up status report regarding  this interventional procedure.  Comments:  No additional relevant information.  Plan of Care  Orders:  Orders Placed This Encounter  Procedures  . LUMBAR FACET(MEDIAL BRANCH NERVE BLOCK) MBNB    Scheduling Instructions:  Procedure: Lumbar facet block (AKA.: Lumbosacral medial branch nerve block)     Side: Left-sided     Level: L3-4, L4-5, & L5-S1 Facets (L2, L3, L4, L5, & S1 Medial Branch Nerves)     Sedation: Patient's choice.     Timeframe: Today    Order Specific Question:   Where will this procedure be performed?    Answer:   [facility]  . DG [facility] C-ARM 1-60 MIN NO REPORT    Intraoperative interpretation by procedural physician at [place].    Standing Status:   Standing    Number of Occurrences:   1    Order Specific Question:   Reason for exam:    Answer:   Assistance in needle guidance and placement for procedures requiring needle placement in or near specific anatomical locations not easily accessible without such assistance.  . Informed Consent Details: Physician/Practitioner Attestation; Transcribe to consent form and obtain patient signature    Nursing Order: Transcribe to consent form and obtain patient signature. Note: Always confirm laterality of pain with Angela Espinoza, before procedure. Procedure: Lumbar Facet Block  under fluoroscopic guidance Indication/Reason: Low Back Pain, with our without leg pain, due to Facet Joint Arthralgia (Joint Pain) known as Lumbar Facet Syndrome, secondary to Lumbar, and/or Lumbosacral Spondylosis (Arthritis of the Spine), without myelopathy or radiculopathy (Nerve Damage). Provider Attestation: I, Monroe Dossie Arbour, MD, (Pain Management Specialist), the physician/practitioner, attest that I have discussed with the patient the benefits, risks, side effects, alternatives, likelihood of achieving goals and potential problems during recovery for the procedure that I have provided informed consent.  .  Provide equipment / supplies at bedside    Equipment required: Single use, disposable, "Block Tray"    Standing Status:   Standing    Number of Occurrences:   1    Order Specific Question:   Specify    Answer:   Block Tray   Chronic Opioid Analgesic:  No opioid analgesics from our practice. MME/day:0 mg/day   Medications ordered for procedure: Meds ordered this encounter  Medications  . lidocaine (XYLOCAINE) 2 % (with pres) injection 400 mg  . lactated ringers infusion 1,000 mL  . midazolam (VERSED) 5 MG/5ML injection 1-2 mg    Make sure Flumazenil is available in the pyxis when using this medication. If oversedation occurs, administer 0.2 mg IV over 15 sec. If after 45 sec no response, administer 0.2 mg again over 1 min; may repeat at 1 min intervals; not to exceed 4 doses (1 mg)  . fentaNYL (SUBLIMAZE) injection 25-50 mcg    Make sure Narcan is available in the pyxis when using this medication. In the event of respiratory depression (RR< 8/min): Titrate NARCAN (naloxone) in increments of 0.1 to 0.2 mg IV at 2-3 minute intervals, until desired degree of reversal.  . ropivacaine (PF) 2 mg/mL (0.2%) (NAROPIN) injection 9 mL  . triamcinolone acetonide (KENALOG-40) injection 40 mg   Medications administered: We administered lactated ringers, midazolam, fentaNYL, and triamcinolone acetonide.  See the medical record for exact dosing, route, and time of administration.  Follow-up plan:   Return in about 2 weeks (around 06/11/2019) for (PP), (VV).       Interventional management options: Considering:   Diagnostic left L1-2LESI #1   Palliative PRN treatment(s):   Palliative left thoracolumbar facet medial branch (T12, L1, L2, & L3) block #3  Palliative left thoracolumbar (T12, L1, L2, & L3) medial branch facet RFA #2 (last done 09/30/2018)  Recent Visits Date Type Provider Dept  04/13/19 Telemedicine Delano Metz, MD [facility]  Showing recent visits within  past 90 days and meeting all other requirements   Today's Visits Date Type Provider Dept  05/28/19 Procedure visit Delano Metz, MD [facility]  Showing today's visits and meeting all other requirements   Future Appointments Date Type Provider Dept  06/17/19 Appointment Delano Metz, MD [facility]  07/13/19 Appointment Delano Metz, MD [facility]  Showing future appointments within next 90 days and meeting all other requirements   Disposition: Discharge home  Discharge (Date  Time): 05/28/2019; 1119 hrs.   Primary Care Physician: Toy Cookey, FNP Location: [facility] Note by: Oswaldo Done, MD Date: 05/28/2019; Time: 12:03 PM  Disclaimer:  Medicine is not an Visual merchandiser. The only guarantee in medicine is that nothing is guaranteed. It is important to note that the decision to proceed with this intervention was based on the information collected from the patient. The Data and conclusions were drawn from the patient's questionnaire, the interview, and the physical examination. Because the information was provided in large part by the patient, it cannot be guaranteed that it has not been purposely or unconsciously manipulated. Every effort has been made to obtain as much relevant data as possible for this evaluation. It is important to note that the conclusions that lead to this procedure are derived in large part from the available data. Always take into account that the treatment will also be dependent on availability of resources and existing treatment guidelines, considered by other Pain Management Practitioners as being common knowledge and practice, at the time of the intervention. For Medico-Legal purposes, it is also important to point out that variation in procedural techniques and pharmacological choices are the acceptable norm. The indications, contraindications, technique, and results of the  above procedure should only be interpreted and judged by a Board-Certified Interventional Pain Specialist with extensive familiarity and expertise in the same exact procedure and technique.

## 2019-05-28 NOTE — Progress Notes (Signed)
Safety precautions to be maintained throughout the outpatient stay will include: orient to surroundings, keep bed in low position, maintain call bell within reach at all times, provide assistance with transfer out of bed and ambulation.  

## 2019-05-28 NOTE — Patient Instructions (Signed)

## 2019-05-29 ENCOUNTER — Telehealth: Payer: Self-pay

## 2019-05-29 NOTE — Telephone Encounter (Signed)
POst procedure phone call.  LM  

## 2019-06-16 ENCOUNTER — Telehealth: Payer: Self-pay | Admitting: *Deleted

## 2019-06-16 NOTE — Telephone Encounter (Signed)
Attempted to call for pre appointment review of allergies/meds. Message left. 

## 2019-06-17 ENCOUNTER — Other Ambulatory Visit: Payer: Self-pay

## 2019-06-17 ENCOUNTER — Ambulatory Visit: Payer: Medicare HMO | Attending: Pain Medicine | Admitting: Pain Medicine

## 2019-06-17 DIAGNOSIS — G894 Chronic pain syndrome: Secondary | ICD-10-CM

## 2019-06-17 DIAGNOSIS — M545 Low back pain, unspecified: Secondary | ICD-10-CM

## 2019-06-17 DIAGNOSIS — M47816 Spondylosis without myelopathy or radiculopathy, lumbar region: Secondary | ICD-10-CM

## 2019-06-17 DIAGNOSIS — M792 Neuralgia and neuritis, unspecified: Secondary | ICD-10-CM

## 2019-06-17 DIAGNOSIS — M47894 Other spondylosis, thoracic region: Secondary | ICD-10-CM

## 2019-06-17 DIAGNOSIS — G8929 Other chronic pain: Secondary | ICD-10-CM

## 2019-06-17 MED ORDER — GABAPENTIN 300 MG PO CAPS: ORAL_CAPSULE | ORAL | 5 refills | Status: DC

## 2019-06-17 NOTE — Progress Notes (Signed)
Patient: Angela Espinoza  Service Category: E/M  Provider: Gaspar Cola, MD  DOB: Jul 19, 1955  DOS: 06/17/2019  Location: Office  MRN: [id number]  Setting: Ambulatory outpatient  Referring Provider: Gennette Pac, FNP  Type: Established Patient  Specialty: Interventional Pain Management  PCP: Gennette Pac, FNP  Location: Remote location  Delivery: TeleHealth     Virtual Encounter - Pain Management PROVIDER NOTE: Information contained herein reflects review and annotations entered in association with encounter. Interpretation of such information and data should be left to medically-trained personnel. Information provided to patient can be located elsewhere in the medical record under "Patient Instructions". Document created using STT-dictation technology, any transcriptional errors that may result from process are unintentional.    Contact & Pharmacy Preferred: [place]: [phone] (home) Mobile: [phone] (mobile) E-mail: [email or web address]  [id number] [address] [id number]: [phone]: [phone]  Pre-screening  Angela Espinoza offered "in-person" vs "virtual" encounter. She indicated preferring virtual for this encounter.   Reason COVID-19*  Social distancing based on CDC and AMA recommendations.   I contacted BNeidy GuerrieriCulberson on 06/17/2019 via telephone.      I clearly identified myself as FGaspar Cola MD. I verified that I was speaking with the correct person using two identifiers (Name: Angela Espinoza and date of birth: 09/04/1955-06-20.  Consent I sought verbal advanced consent from BMadaline Savagefor virtual visit interactions. I informed Angela Espinoza of possible security and privacy concerns, risks, and limitations associated with providing "not-in-person" medical evaluation and management services. I also informed Angela Espinoza of the  availability of "in-person" appointments. Finally, I informed her that there would be a charge for the virtual visit and that she could be  personally, fully or partially, financially responsible for it. Ms. CGivhanexpressed understanding and agreed to proceed.   Historic Elements   Ms. BKiyana Espinoza a 64y.o. year old, female patient evaluated today after her last contact with our practice on 06/16/2019. Ms. [name], Asthma, Depression, Hyperlipidemia, Hypertension, and IBS (irritable bowel syndrome). She also  has a past surgical history that includes Oophorectomy; Exploratory laparotomy; Foot surgery; Abdominal hysterectomy; Colonoscopy; Tonsillectomy; Axillary lymph node biopsy (Right, 08/03/2014); Kyphoplasty (N/A, 12/10/2014); and Breast biopsy (Left, 10/07/2018). [name] a current medication list which includes the following prescription(s): biotin, black elderberry, black pepper-turmeric, calcium carbonate-vitamin d, cholecalciferol, duloxetine, [START ON 07/13/2019] gabapentin, levocetirizine, losartan, meloxicam, metoprolol succinate, multivitamin, omega-3 fatty acids, and vitamin c. She  reports that she is a non-smoker but has been exposed to tobacco smoke. She has never used smokeless tobacco. She reports current alcohol use. She reports that she does not use drugs. [name] allergic to indapamide.   HPI  Today, she is being contacted for both, medication management and a post-procedure assessment.  Today we contacted the patient to clarify how she is taking her gabapentin.  She indicates that the diagnostic left lumbar facet block provided her with excellent relief of the pain for 1 week where she was having absolutely no pain.  However, after that the pain started coming back.  Upon talking to the patient, it turns out that she had stopped taking her Mobic since she thought that she did not need anything for her pain.   However, in interpreting the results of her diagnostic injection, the first thing that I have communicated to her is that clearly the  facet joints is where the pain was coming from.  This can be seen by the results of the effects of the numbing medicine.  However, because she did experience 100% relief of the pain that lasted longer than the duration of the local anesthetic, there is also a component of inflammation involved in the maintenance of her pain.  Today I reviewed her labs and her sed rate and C-reactive protein are from 2019 and therefore they are not really contributing that much to telling us what is going on in terms of inflammatory process.  However, today I took the time to review her meloxicam intake and the gabapentin and I have recommended to the patient that she take the meloxicam on a regular basis to prevent the inflammatory process from returning.  I have also recommended that she continue taking the Prilosec while she is taking the meloxicam since this combination has worked in preventing her from having side effects from the NSAIDs.  In terms of her gabapentin, as it turns out she was not using it on a regular basis and therefore I have explained to the patient the need to take it on a regular basis in order to prevent pain from occurring.  I have pointed out to the patient that it takes anywhere from 2 to 3 weeks for blood levels to be reached and the fact that she is not taking the medication on a regular basis could explain why she still having some breakthrough pain.  She understood and accepted and she indicated that she would be getting into a more steady regimen.  Aside from this I also explained to the patient that she would be a good candidate for radiofrequency ablation and she indicated that she has had those done before with limited results, at a different practice.  However, she also pointed out that she is interested in having me do that radiofrequency ablation since clearly  what I have been doing has provided her with much better relief than the interventional therapies that she received at [facility].  The main reason for this is likely to be the fact that they had not included all levels that were affected.  Prior research that I have conducted for the purpose of writing a chapter on lumbar facet arthropathy and its treatment revealed that 98% of the cases involves the 3 lower lumbar levels.  The same resector also revealed that lumbar facet pathology is not always observed on diagnostic x-rays.  This would suggest that those physicians using x-rays to determine the levels the need to be treated, are in fact missing a significant amount of areas by doing so.  In any case, the patient is now aware that she is a good candidate for radiofrequency ablation and at this point because she is still getting good relief of the pain, she has decided to hold on any further injections for the time being.  I have entered a PRN order for the diagnostic lumbar facet block #2 and I have instructed the patient to give [place] a call and request to come in to have the injection done as soon as the pain returns.  She understood and accepted.  Post-Procedure Evaluation  Procedure: Diagnostic left lumbar facet block #1 under fluoroscopic guidance and IV sedation Pre-procedure pain level: 8/10 Post-procedure: 0/10 (100% relief)  Sedation: Sedation provided.  Effectiveness during initial hour after procedure(Ultra-Short Term Relief): 100 % .  Local anesthetic used: Long-acting (4-6 hours) Effectiveness: Defined as any analgesic benefit obtained secondary  to the administration of local anesthetics. This carries significant diagnostic value as to the etiological location, or anatomical origin, of the pain. Duration of benefit is expected to coincide with the duration of the local anesthetic used.  Effectiveness during initial 4-6 hours after procedure(Short-Term Relief): 100 % .  Long-term benefit: Defined  as any relief past the pharmacologic duration of the local anesthetics.  Effectiveness past the initial 6 hours after procedure(Long-Term Relief): 100 %(for one week only) .  Current benefits: Defined as benefit that persist at this time.   Analgesia:  90-100% better x 1 wk.  Function: Angela Espinoza reports improvement in function ROM: Angela Espinoza reports improvement in ROM  Pharmacotherapy Assessment  Analgesic: No opioid analgesics from our practice. MME/day:0 mg/day   Monitoring: [place] PMP: PDMP reviewed during this encounter.       Pharmacotherapy: No side-effects or adverse reactions reported. Compliance: No problems identified. Effectiveness: Clinically acceptable. Plan: Refer to "POC".  UDS:  Summary  Date Value Ref Range Status  12/10/2017 FINAL  Final    Comment:    ==================================================================== TOXASSURE COMP DRUG ANALYSIS,UR ==================================================================== Test                             Result       Flag       Units Drug Present and Declared for Prescription Verification   Cyclobenzaprine                PRESENT      EXPECTED   Amitriptyline                  PRESENT      EXPECTED   Metoprolol                     PRESENT      EXPECTED Drug Absent but Declared for Prescription Verification   Desmethylcyclobenzaprine       Not Detected UNEXPECTED   Duloxetine                     Not Detected UNEXPECTED ==================================================================== Test                      Result    Flag   Units      Ref Range   Creatinine              28               mg/dL      >=20 ==================================================================== Declared Medications:  The flagging and interpretation on this report are based on the  following declared medications.  Unexpected results may arise from  inaccuracies in the declared medications.  **Note: The testing scope of this  panel includes these medications:  Amitriptyline  Cyclobenzaprine  Duloxetine  Metoprolol  **Note: The testing scope of this panel does not include following  reported medications:  Cholecalciferol  Hydrochlorothiazide  Levocetirizine  Losartan (Losartan Potassium)  Magnesium  Meloxicam  Multivitamin  Supplement  Supplement (Omega-3)  Vitamin B (Biotin)  Vitamin C ==================================================================== For clinical consultation, please call [phone]. ====================================================================    Laboratory Chemistry Profile   Renal Lab Results  Component Value Date   BUN 10 01/01/2018   CREATININE 0.88 01/01/2018   BCR 11 (L) 01/01/2018   GFRAA 81 01/01/2018   GFRNONAA 70 01/01/2018     Hepatic Lab Results  Component Value Date  AST 26 01/01/2018   ALT 42 08/10/2011   ALBUMIN 4.6 01/01/2018   ALKPHOS 73 01/01/2018     Electrolytes Lab Results  Component Value Date   NA 136 01/01/2018   K 4.5 01/01/2018   CL 98 01/01/2018   CALCIUM 9.9 01/01/2018   MG 2.3 12/10/2017   PHOS 1.6 (L) 08/10/2011     Bone Lab Results  Component Value Date   25OHVITD1 40 01/01/2018   25OHVITD2 <1.0 01/01/2018   25OHVITD3 40 01/01/2018     Inflammation (CRP: Acute Phase) (ESR: Chronic Phase) Lab Results  Component Value Date   CRP 1 12/10/2017   ESRSEDRATE 3 12/10/2017       Note: Above Lab results reviewed.  Imaging  DG PAIN CLINIC C-ARM 1-60 MIN NO REPORT Fluoro was used, but no Radiologist interpretation will be provided.  Please refer to "NOTES" tab for provider progress note.  Assessment  The primary encounter diagnosis was Chronic pain syndrome. Diagnoses of Chronic low back pain (Primary Area of Pain) (Left) w/o sciatica, Thoracic facet syndrome (Left), Lumbar facet syndrome (Left), and Neurogenic pain were also pertinent to this visit.  Plan of Care  Problem-specific:  No problem-specific  Assessment & Plan notes found for this encounter.  Angela Espinoza has a current medication list which includes the following long-term medication(s): calcium carbonate-vitamin d, [START ON 07/13/2019] gabapentin, levocetirizine, losartan, and metoprolol succinate.  Pharmacotherapy (Medications Ordered): Meds ordered this encounter  Medications  . gabapentin (NEURONTIN) 300 MG capsule    Sig: Take 1 capsule (300 mg total) by mouth 3 (three) times daily AND 2-3 capsules (600-900 mg total) at bedtime.    Dispense:  180 capsule    Refill:  5    Fill one day early if pharmacy is closed on scheduled refill date. May substitute for generic if available.   Orders:  Orders Placed This Encounter  Procedures  . LUMBAR FACET(MEDIAL BRANCH NERVE BLOCK) MBNB    Scheduling timeframe: (PRN procedure) Angela Espinoza will call when needed. Clinical indication: Axial low back pain. Lumbosacral Spondylosis (M47.897).  Sedation: Usually done with sedation. (May be done without sedation if so desired by patient.) Requirements: NPO x 8 hrs.; Driver; Stop blood thinners. Interval: No sooner than two weeks for diagnostic or therapeutic. No sooner than every other month for palliative.    Standing Status:   Standing    Number of Occurrences:   5    Standing Expiration Date:   06/16/2020    Scheduling Instructions:     Procedure: Lumbar facet block (AKA.: Lumbosacral medial branch nerve block)     Level: L3-4, L4-5, & L5-S1 Facets (L2, L3, L4, L5, & S1 Medial Branch Nerves)     Laterality: Left-sided    Order Specific Question:   Where will this procedure be performed?    Answer:   [facility]   Follow-up plan:   Return in about 29 weeks (around 01/06/2020) for (F2F), (MM), PRN Procedure(s): (L) L-FCT BLK #2, (w/ Sedation).      Interventional management options: Considering:   Diagnostic left L1-2LESI #1 Diagnostic left lumbar facet block #2    Palliative PRN treatment(s):    Palliative left thoracolumbar facet medial branch (T12, L1, L2, & L3) block #3  Palliative left thoracolumbar (T12, L1, L2, & L3) medial branch facet RFA #2 (last done 09/30/2018)  Diagnostic left lumbar facet block #2 (Diagnostic #1: 100/100/100x 1 wk) Note: The patient has indicated that she has had prior lumbar  facet radiofrequency ablations at [facility].    Recent Visits Date Type Provider Dept  05/28/19 Procedure visit Milinda Pointer, MD [facility]  04/13/19 Telemedicine Milinda Pointer, MD [facility]  Showing recent visits within past 90 days and meeting all other requirements   Future Appointments Date Type Provider Dept  07/08/19 Appointment Milinda Pointer, MD [facility]  Showing future appointments within next 90 days and meeting all other requirements   I discussed the assessment and treatment plan with the patient. The patient was provided an opportunity to ask questions and all were answered. The patient agreed with the plan and demonstrated an understanding of the instructions.  Patient advised to call back or seek an in-person evaluation if the symptoms or condition worsens.  Duration of encounter: 20 minutes.  Note by: Gaspar Cola, MD Date: 06/17/2019; Time: 10:27 AM

## 2019-06-17 NOTE — Patient Instructions (Signed)
____________________________________________________________________________________________  Preparing for Procedure with Sedation  Procedure appointments are limited to planned procedures: . No Prescription Refills. . No disability issues will be discussed. . No medication changes will be discussed.  Instructions: . Oral Intake: Do not eat or drink anything for at least 8 hours prior to your procedure. (Exception: Blood Pressure Medication. See below.) . Transportation: Unless otherwise stated by your physician, you may drive yourself after the procedure. . Blood Pressure Medicine: Do not forget to take your blood pressure medicine with a sip of water the morning of the procedure. If your Diastolic (lower reading)is above 100 mmHg, elective cases will be cancelled/rescheduled. . Blood thinners: These will need to be stopped for procedures. Notify our staff if you are taking any blood thinners. Depending on which one you take, there will be specific instructions on how and when to stop it. . Diabetics on insulin: Notify the staff so that you can be scheduled 1st case in the morning. If your diabetes requires high dose insulin, take only  of your normal insulin dose the morning of the procedure and notify the staff that you have done so. . Preventing infections: Shower with an antibacterial soap the morning of your procedure. . Build-up your immune system: Take 1000 mg of Vitamin C with every meal (3 times a day) the day prior to your procedure. . Antibiotics: Inform the staff if you have a condition or reason that requires you to take antibiotics before dental procedures. . Pregnancy: If you are pregnant, call and cancel the procedure. . Sickness: If you have a cold, fever, or any active infections, call and cancel the procedure. . Arrival: You must be in the facility at least 30 minutes prior to your scheduled procedure. . Children: Do not bring children with you. . Dress appropriately:  Bring dark clothing that you would not mind if they get stained. . Valuables: Do not bring any jewelry or valuables.  Reasons to call and reschedule or cancel your procedure: (Following these recommendations will minimize the risk of a serious complication.) . Surgeries: Avoid having procedures within 2 weeks of any surgery. (Avoid for 2 weeks before or after any surgery). . Flu Shots: Avoid having procedures within 2 weeks of a flu shots or . (Avoid for 2 weeks before or after immunizations). . Barium: Avoid having a procedure within 7-10 days after having had a radiological study involving the use of radiological contrast. (Myelograms, Barium swallow or enema study). . Heart attacks: Avoid any elective procedures or surgeries for the initial 6 months after a "Myocardial Infarction" (Heart Attack). . Blood thinners: It is imperative that you stop these medications before procedures. Let us know if you if you take any blood thinner.  . Infection: Avoid procedures during or within two weeks of an infection (including chest colds or gastrointestinal problems). Symptoms associated with infections include: Localized redness, fever, chills, night sweats or profuse sweating, burning sensation when voiding, cough, congestion, stuffiness, runny nose, sore throat, diarrhea, nausea, vomiting, cold or Flu symptoms, recent or current infections. It is specially important if the infection is over the area that we intend to treat. . Heart and lung problems: Symptoms that may suggest an active cardiopulmonary problem include: cough, chest pain, breathing difficulties or shortness of breath, dizziness, ankle swelling, uncontrolled high or unusually low blood pressure, and/or palpitations. If you are experiencing any of these symptoms, cancel your procedure and contact your primary care physician for an evaluation.  Remember:  Regular Business hours are:    Monday to Thursday 8:00 AM to 4:00 PM  Provider's  Schedule: Krystale Rinkenberger, MD:  Procedure days: Tuesday and Thursday 7:30 AM to 4:00 PM  Bilal Lateef, MD:  Procedure days: Monday and Wednesday 7:30 AM to 4:00 PM ____________________________________________________________________________________________    

## 2019-06-22 ENCOUNTER — Telehealth: Payer: Self-pay

## 2019-06-22 NOTE — Telephone Encounter (Signed)
I called patient and  she states that she would like to have an RF. Angela Espinoza has had one in the past. She would like to do whatever Dr Laban Emperor thinks is best for her. Can you please Let Alona Bene know what to schedule this patient for. Thanks.

## 2019-06-22 NOTE — Telephone Encounter (Signed)
She called in requesting an RFA. The last visit stated come in for lumbar facets #2. What do I need to schedule her for? There is a future RFA order but I'm going by what the last visit notes stated.

## 2019-06-30 NOTE — Telephone Encounter (Signed)
Can this be addressed today please? It has been a week and the patient is waiting. Thanks

## 2019-07-01 NOTE — Telephone Encounter (Signed)
Is this going to be bilateral? He still hasn't put in an order for the RF

## 2019-07-01 NOTE — Telephone Encounter (Signed)
If Dr Laban Emperor does an RF it will only be for one side. He does not do bilaterals RF

## 2019-07-08 ENCOUNTER — Ambulatory Visit: Payer: Medicare HMO | Admitting: Pain Medicine

## 2019-07-13 ENCOUNTER — Telehealth: Payer: Medicare HMO | Admitting: Pain Medicine

## 2019-07-28 ENCOUNTER — Ambulatory Visit
Admission: RE | Admit: 2019-07-28 | Discharge: 2019-07-28 | Disposition: A | Discharge status: Home / Self Care | Payer: Medicare HMO | Source: Ambulatory Visit | Attending: Pain Medicine | Admitting: Pain Medicine

## 2019-07-28 ENCOUNTER — Encounter: Payer: Self-pay | Admitting: Pain Medicine

## 2019-07-28 ENCOUNTER — Other Ambulatory Visit: Payer: Self-pay

## 2019-07-28 ENCOUNTER — Ambulatory Visit (HOSPITAL_BASED_OUTPATIENT_CLINIC_OR_DEPARTMENT_OTHER): Payer: Medicare HMO | Admitting: Pain Medicine

## 2019-07-28 VITALS — BP 111/50 | HR 73 | Temp 97.1°F | Resp 16 | Ht 62.0 in | Wt 145.0 lb

## 2019-07-28 DIAGNOSIS — G8929 Other chronic pain: Secondary | ICD-10-CM | POA: Insufficient documentation

## 2019-07-28 DIAGNOSIS — M47816 Spondylosis without myelopathy or radiculopathy, lumbar region: Secondary | ICD-10-CM | POA: Diagnosis not present

## 2019-07-28 DIAGNOSIS — G8918 Other acute postprocedural pain: Secondary | ICD-10-CM | POA: Insufficient documentation

## 2019-07-28 DIAGNOSIS — M5137 Other intervertebral disc degeneration, lumbosacral region: Secondary | ICD-10-CM | POA: Diagnosis present

## 2019-07-28 DIAGNOSIS — M545 Low back pain, unspecified: Secondary | ICD-10-CM

## 2019-07-28 DIAGNOSIS — M47817 Spondylosis without myelopathy or radiculopathy, lumbosacral region: Secondary | ICD-10-CM | POA: Diagnosis present

## 2019-07-28 MED ORDER — TRIAMCINOLONE ACETONIDE 40 MG/ML IJ SUSP
40.0000 mg | Freq: Once | INTRAMUSCULAR | Status: AC
  Administered 2019-07-28: 40 mg
  Filled 2019-07-28: qty 1

## 2019-07-28 MED ORDER — ROPIVACAINE HCL 2 MG/ML IJ SOLN
INTRAMUSCULAR | Status: AC
  Filled 2019-07-28: qty 10

## 2019-07-28 MED ORDER — LIDOCAINE HCL 2 % IJ SOLN
20.0000 mL | Freq: Once | INTRAMUSCULAR | Status: AC
  Administered 2019-07-28: 200 mg
  Filled 2019-07-28: qty 10

## 2019-07-28 MED ORDER — LACTATED RINGERS IV SOLN
1000.0000 mL | Freq: Once | INTRAVENOUS | Status: AC
  Administered 2019-07-28: 1000 mL via INTRAVENOUS

## 2019-07-28 MED ORDER — LIDOCAINE HCL 2 % IJ SOLN
INTRAMUSCULAR | Status: AC
  Filled 2019-07-28: qty 10

## 2019-07-28 MED ORDER — HYDROCODONE-ACETAMINOPHEN 5-325 MG PO TABS: 1.0000 | ORAL_TABLET | Freq: Four times a day (QID) | ORAL | 0 refills | Status: AC | PRN

## 2019-07-28 MED ORDER — FENTANYL CITRATE (PF) 100 MCG/2ML IJ SOLN
25.0000 ug | INTRAMUSCULAR | Status: DC | PRN
  Administered 2019-07-28: 75 ug via INTRAVENOUS
  Filled 2019-07-28 (×2): qty 2

## 2019-07-28 MED ORDER — ROPIVACAINE HCL 2 MG/ML IJ SOLN
9.0000 mL | Freq: Once | INTRAMUSCULAR | Status: DC
  Filled 2019-07-28: qty 10

## 2019-07-28 MED ORDER — MIDAZOLAM HCL 5 MG/5ML IJ SOLN
1.0000 mg | INTRAMUSCULAR | Status: DC | PRN
  Administered 2019-07-28: 1.5 mg via INTRAVENOUS
  Filled 2019-07-28 (×2): qty 5

## 2019-07-28 MED ORDER — LIDOCAINE HCL 2 % IJ SOLN
INTRAMUSCULAR | Status: AC
  Filled 2019-07-28: qty 20

## 2019-07-28 MED ORDER — TRIAMCINOLONE ACETONIDE 40 MG/ML IJ SUSP
INTRAMUSCULAR | Status: AC
  Filled 2019-07-28: qty 1

## 2019-07-28 NOTE — Patient Instructions (Signed)

## 2019-07-28 NOTE — Progress Notes (Signed)
Safety precautions to be maintained throughout the outpatient stay will include: orient to surroundings, keep bed in low position, maintain call bell within reach at all times, provide assistance with transfer out of bed and ambulation.  

## 2019-07-28 NOTE — Progress Notes (Signed)
PROVIDER NOTE: Information contained herein reflects review and annotations entered in association with encounter. Interpretation of such information and data should be left to medically-trained personnel. Information provided to patient can be located elsewhere in the medical record under "Patient Instructions". Document created using STT-dictation technology, any transcriptional errors that may result from process are unintentional.    Patient: Angela Espinoza  Service Category: Procedure  Provider: Oswaldo DoneFrancisco A Eiza Canniff, MD  DOB: 04-23-1955  DOS: 07/28/2019  Location: [facility]  MRN: [id number]  Setting: Ambulatory - outpatient  Referring Provider: Toy CookeyHeadrick, Emily, Espinoza  Type: Established Patient  Specialty: Interventional Pain Management  PCP: Angela Espinoza   Primary Reason for Visit: Interventional Pain Management Treatment. CC: Back Pain (lower left)  Procedure:          Anesthesia, Analgesia, Anxiolysis:  Type: Thermal Lumbar Facet, Medial Branch Radiofrequency Ablation/Neurotomy  #1  Primary Purpose: Therapeutic Region: Posterolateral Lumbosacral Spine Level: L4, L5, & S1 Medial Branch Level(s). These levels will denervate the L4-5, and the L5-S1 lumbar facet joints. Laterality: Left  Type: Moderate (Conscious) Sedation combined with Local Anesthesia Indication(s): Analgesia and Anxiety Route: Intravenous (IV) IV Access: Secured Sedation: Meaningful verbal contact was maintained at all times during the procedure  Local Anesthetic: Lidocaine 1-2%  Position: Prone   Indications: 1. Lumbar facet syndrome (Left)   2. Spondylosis without myelopathy or radiculopathy, lumbosacral region   3. DDD (degenerative disc disease), lumbosacral   4. Chronic low back pain (Primary Area of Pain) (Left) w/o sciatica    Angela Espinoza has been dealing with the above chronic pain for longer than three months and has either failed to respond, was unable to tolerate, or  simply did not get enough benefit from other more conservative therapies including, but not limited to: 1. Over-the-counter medications 2. Anti-inflammatory medications 3. Muscle relaxants 4. Membrane stabilizers 5. Opioids 6. Physical therapy and/or chiropractic manipulation 7. Modalities (Heat, ice, etc.) 8. Invasive techniques such as nerve blocks. Angela Espinoza has attained more than 50% relief of the pain from a series of diagnostic injections conducted in separate occasions.  Pain Score: Pre-procedure: 9 /10 Post-procedure: 0-No pain/10  Pre-op Assessment:  Angela Espinoza is a 64 y.o. (year old), female patient, seen today for interventional treatment. She  has a past surgical history that includes Oophorectomy; Exploratory laparotomy; Foot surgery; Abdominal hysterectomy; Colonoscopy; Tonsillectomy; Axillary lymph node biopsy (Right, 08/03/2014); Kyphoplasty (N/A, 12/10/2014); and Breast biopsy (Left, 10/07/2018). Angela Espinoza has a current medication list which includes the following prescription(s): biotin, black elderberry, black pepper-turmeric, calcium carbonate-vitamin d, cholecalciferol, duloxetine, gabapentin, levocetirizine, losartan, meloxicam, metoprolol succinate, multivitamin, omega-3 fatty acids, vitamin c, hydrocodone-acetaminophen, and [START ON 08/04/2019] hydrocodone-acetaminophen, and the following Facility-Administered Medications: fentanyl, midazolam, and ropivacaine (pf) 2 mg/ml (0.2%). Her primarily concern today is the Back Pain (lower left)  Initial Vital Signs:  Pulse/HCG Rate: 73ECG Heart Rate: 69 Temp: (!) 97.2 F (36.2 C) Resp: 16 BP: 113/67 SpO2: 100 %  BMI: Estimated body mass index is 26.52 kg/m as calculated from the following:   Height as of this encounter: 5\' 2"  (1.575 m).   Weight as of this encounter: 145 lb (65.8 kg).  Risk Assessment: Allergies: Reviewed. She is allergic to indapamide.  Allergy Precautions: None required Coagulopathies:  Reviewed. None identified.  Blood-thinner therapy: None at this time Active Infection(s): Reviewed. None identified. Angela Espinoza is afebrile  Site Confirmation: Angela Espinoza was asked to confirm the procedure and laterality before marking the site Procedure checklist: Completed  Consent: Before the procedure and under the influence of no sedative(s), amnesic(s), or anxiolytics, the patient was informed of the treatment options, risks and possible complications. To fulfill our ethical and legal obligations, as recommended by the American Medical Association's Code of Ethics, I have informed the patient of my clinical impression; the nature and purpose of the treatment or procedure; the risks, benefits, and possible complications of the intervention; the alternatives, including doing nothing; the risk(s) and benefit(s) of the alternative treatment(s) or procedure(s); and the risk(s) and benefit(s) of doing nothing. The patient was provided information about the general risks and possible complications associated with the procedure. These may include, but are not limited to: failure to achieve desired goals, infection, bleeding, organ or nerve damage, allergic reactions, paralysis, and death. In addition, the patient was informed of those risks and complications associated to Spine-related procedures, such as failure to decrease pain; infection (i.e.: Meningitis, epidural or intraspinal abscess); bleeding (i.e.: epidural hematoma, subarachnoid hemorrhage, or any other type of intraspinal or peri-dural bleeding); organ or nerve damage (i.e.: Any type of peripheral nerve, nerve root, or spinal cord injury) with subsequent damage to sensory, motor, and/or autonomic systems, resulting in permanent pain, numbness, and/or weakness of one or several areas of the body; allergic reactions; (i.e.: anaphylactic reaction); and/or death. Furthermore, the patient was informed of those risks and complications associated  with the medications. These include, but are not limited to: allergic reactions (i.e.: anaphylactic or anaphylactoid reaction(s)); adrenal axis suppression; blood sugar elevation that in diabetics may result in ketoacidosis or comma; water retention that in patients with history of congestive heart failure may result in shortness of breath, pulmonary edema, and decompensation with resultant heart failure; weight gain; swelling or edema; medication-induced neural toxicity; particulate matter embolism and blood vessel occlusion with resultant organ, and/or nervous system infarction; and/or aseptic necrosis of one or more joints. Finally, the patient was informed that Medicine is not an exact science; therefore, there is also the possibility of unforeseen or unpredictable risks and/or possible complications that may result in a catastrophic outcome. The patient indicated having understood very clearly. We have given the patient no guarantees and we have made no promises. Enough time was given to the patient to ask questions, all of which were answered to the patient's satisfaction. Ms. Fifer has indicated that she wanted to continue with the procedure. Attestation: I, the ordering provider, attest that I have discussed with the patient the benefits, risks, side-effects, alternatives, likelihood of achieving goals, and potential problems during recovery for the procedure that I have provided informed consent. Date  Time: 07/28/2019 10:34 AM  Pre-Procedure Preparation:  Monitoring: As per clinic protocol. Respiration, ETCO2, SpO2, BP, heart rate and rhythm monitor placed and checked for adequate function Safety Precautions: Patient was assessed for positional comfort and pressure points before starting the procedure. Time-out: I initiated and conducted the "Time-out" before starting the procedure, as per protocol. The patient was asked to participate by confirming the accuracy of the "Time Out" information.  Verification of the correct person, site, and procedure were performed and confirmed by me, the nursing staff, and the patient. "Time-out" conducted as per Joint Commission's Universal Protocol (UP.01.01.01). Time: 1109  Description of Procedure:          Laterality: Left Levels:  L4, L5, & S1 Medial Branch Level(s), at the L4-5, and the L5-S1 lumbar facet joints. Area Prepped: Lumbosacral DuraPrep (Iodine Povacrylex [0.7% available iodine] and Isopropyl Alcohol, 74% w/w) Safety Precautions: Aspiration looking for  blood return was conducted prior to all injections. At no point did we inject any substances, as a needle was being advanced. Before injecting, the patient was told to immediately notify me if she was experiencing any new onset of "ringing in the ears, or metallic taste in the mouth". No attempts were made at seeking any paresthesias. Safe injection practices and needle disposal techniques used. Medications properly checked for expiration dates. SDV (single dose vial) medications used. After the completion of the procedure, all disposable equipment used was discarded in the proper designated medical waste containers. Local Anesthesia: Protocol guidelines were followed. The patient was positioned over the fluoroscopy table. The area was prepped in the usual manner. The time-out was completed. The target area was identified using fluoroscopy. A 12-in long, straight, sterile hemostat was used with fluoroscopic guidance to locate the targets for each level blocked. Once located, the skin was marked with an approved surgical skin marker. Once all sites were marked, the skin (epidermis, dermis, and hypodermis), as well as deeper tissues (fat, connective tissue and muscle) were infiltrated with a small amount of a short-acting local anesthetic, loaded on a 10cc syringe with a 25G, 1.5-in  Needle. An appropriate amount of time was allowed for local anesthetics to take effect before proceeding to the next  step. Local Anesthetic: Lidocaine 2.0% The unused portion of the local anesthetic was discarded in the proper designated containers. Technical explanation of process:  Radiofrequency Ablation (RFA) L4 Medial Branch Nerve RFA: The target area for the L4 medial branch is at the junction of the postero-lateral aspect of the superior articular process and the superior, posterior, and medial edge of the transverse process of L5. Under fluoroscopic guidance, a Radiofrequency needle was inserted until contact was made with os over the superior postero-lateral aspect of the pedicular shadow (target area). Sensory and motor testing was conducted to properly adjust the position of the needle. Once satisfactory placement of the needle was achieved, the numbing solution was slowly injected after negative aspiration for blood. 2.0 mL of the nerve block solution was injected without difficulty or complication. After waiting for at least 3 minutes, the ablation was performed. Once completed, the needle was removed intact. L5 Medial Branch Nerve RFA: The target area for the L5 medial branch is at the junction of the postero-lateral aspect of the superior articular process of S1 and the superior, posterior, and medial edge of the sacral ala. Under fluoroscopic guidance, a Radiofrequency needle was inserted until contact was made with os over the superior postero-lateral aspect of the pedicular shadow (target area). Sensory and motor testing was conducted to properly adjust the position of the needle. Once satisfactory placement of the needle was achieved, the numbing solution was slowly injected after negative aspiration for blood. 2.0 mL of the nerve block solution was injected without difficulty or complication. After waiting for at least 3 minutes, the ablation was performed. Once completed, the needle was removed intact. S1 Medial Branch Nerve RFA: The target area for the S1 medial branch is located inferior to the  junction of the S1 superior articular process and the L5 inferior articular process, posterior, inferior, and lateral to the 6 o'clock position of the L5-S1 facet joint, just superior to the S1 posterior foramen. Under fluoroscopic guidance, the Radiofrequency needle was advanced until contact was made with os over the Target area. Sensory and motor testing was conducted to properly adjust the position of the needle. Once satisfactory placement of the needle was achieved, the  numbing solution was slowly injected after negative aspiration for blood. 2.0 mL of the nerve block solution was injected without difficulty or complication. After waiting for at least 3 minutes, the ablation was performed. Once completed, the needle was removed intact. Radiofrequency lesioning (ablation):  Radiofrequency Generator: NeuroTherm [id number] Sensory Stimulation Parameters: 50 Hz was used to locate & identify the nerve, making sure that the needle was positioned such that there was no sensory stimulation below 0.3 V or above 0.7 V. Motor Stimulation Parameters: 2 Hz was used to evaluate the motor component. Care was taken not to lesion any nerves that demonstrated motor stimulation of the lower extremities at an output of less than 2.5 times that of the sensory threshold, or a maximum of 2.0 V. Lesioning Technique Parameters: Standard Radiofrequency settings. (Not bipolar or pulsed.) Temperature Settings: 80 degrees C Lesioning time: 60 seconds Intra-operative Compliance: Compliant Materials & Medications: Needle(s) (Electrode/Cannula) Type: Teflon-coated, curved tip, Radiofrequency needle(s) Gauge: 22G Length: 10cm Numbing solution: 0.2% PF-Ropivacaine + Triamcinolone (40 mg/mL) diluted to a final concentration of 4 mg of Triamcinolone/mL of Ropivacaine The unused portion of the solution was discarded in the proper designated containers.  Once the entire procedure was completed, the treated area was cleaned, making sure  to leave some of the prepping solution back to take advantage of its long term bactericidal properties.  Illustration of the posterior view of the lumbar spine and the posterior neural structures. Laminae of L2 through S1 are labeled. DPRL5, dorsal primary ramus of L5; DPRS1, dorsal primary ramus of S1; DPR3, dorsal primary ramus of L3; FJ, facet (zygapophyseal) joint L3-L4; I, inferior articular process of L4; LB1, lateral branch of dorsal primary ramus of L1; IAB, inferior articular branches from L3 medial branch (supplies L4-L5 facet joint); IBP, intermediate branch plexus; MB3, medial branch of dorsal primary ramus of L3; NR3, third lumbar nerve root; S, superior articular process of L5; SAB, superior articular branches from L4 (supplies L4-5 facet joint also); TP3, transverse process of L3.  Vitals:   07/28/19 1136 07/28/19 1146 07/28/19 1156 07/28/19 1206  BP: 108/68 (!) 110/49 (!) 111/50   Pulse:      Resp: 12 14 15 16   Temp:  (!) 97.3 F (36.3 C)  (!) 97.1 F (36.2 C)  SpO2: 91% 100% 100% 100%  Weight:      Height:       Start Time: 1109 hrs. End Time: 1136 hrs.  Imaging Guidance (Spinal):          Type of Imaging Technique: Fluoroscopy Guidance (Spinal) Indication(s): Assistance in needle guidance and placement for procedures requiring needle placement in or near specific anatomical locations not easily accessible without such assistance. Exposure Time: Please see nurses notes. Contrast: None used. Fluoroscopic Guidance: I was personally present during the use of fluoroscopy. "Tunnel Vision Technique" used to obtain the best possible view of the target area. Parallax error corrected before commencing the procedure. "Direction-depth-direction" technique used to introduce the needle under continuous pulsed fluoroscopy. Once target was reached, antero-posterior, oblique, and lateral fluoroscopic projection used confirm needle placement in all planes. Images permanently stored in  EMR. Interpretation: No contrast injected. I personally interpreted the imaging intraoperatively. Adequate needle placement confirmed in multiple planes. Permanent images saved into the patient's record.  Antibiotic Prophylaxis:   Anti-infectives (From admission, onward)   None     Indication(s): None identified  Post-operative Assessment:  Post-procedure Vital Signs:  Pulse/HCG Rate: 7371 Temp: (!) 97.1 F (36.2 C) Resp: 16 BP: )  111/50 SpO2: 100 %  EBL: None  Complications: No immediate post-treatment complications observed by team, or reported by patient.  Note: The patient tolerated the entire procedure well. A repeat set of vitals were taken after the procedure and the patient was kept under observation following institutional policy, for this type of procedure. Post-procedural neurological assessment was performed, showing return to baseline, prior to discharge. The patient was provided with post-procedure discharge instructions, including a section on how to identify potential problems. Should any problems arise concerning this procedure, the patient was given instructions to immediately contact us, at any time, without hesitation. In any case, we plan to contact the patient by telephone for a follow-up status report regarding this interventional procedure.  Comments:  No additional relevant information.  Plan of Care  Orders:  Orders Placed This Encounter  Procedures  . Radiofrequency,Lumbar    Scheduling Instructions:     Side(s): Left-sided     Level: L4-5, & L5-S1 Facets (L4, L5, & S1 Medial Branch Nerves)     Sedation: With Sedation.     Timeframe: Today    Order Specific Question:   Where will this procedure be performed?    Answer:   [facility]  . DG PA[facility] C-ARM 1-60 MIN NO REPORT    Intraoperative interpretation by procedural physician at [facility].    Standing Status:   Standing    Number of Occurrences:   1    Order Specific  Question:   Reason for exam:    Answer:   Assistance in needle guidance and placement for procedures requiring needle placement in or near specific anatomical locations not easily accessible without such assistance.  . Provide equipment / supplies at bedside    Equipment required: Single use, disposable, "Block Tray"    Standing Status:   Standing    Number of Occurrences:   1    Order Specific Question:   Specify    Answer:   Block Tray  . Informed Consent Details: Physician/Practitioner Attestation; Transcribe to consent form and obtain patient signature    Nursing Order: Transcribe to consent form and obtain patient signature. Note: Always confirm laterality of pain with Ms. Rabe, before procedure. Procedure: Lumbar Facet Radiofrequency Ablation Indication/Reason: Low Back Pain, with our without leg pain, due to Facet Joint Arthralgia (Joint Pain) known as Lumbar Facet Syndrome, secondary to Lumbar, and/or Lumbosacral Spondylosis (Arthritis of the Spine), without myelopathy or radiculopathy (Nerve Damage). Provider Attestation: I, Kaiah Hosea A. Laban Emperor, MD, (Pain Management Specialist), the physician/practitioner, attest that I have discussed with the patient the benefits, risks, side effects, alternatives, likelihood of achieving goals and potential problems during recovery for the procedure that I have provided informed consent.   Chronic Opioid Analgesic:  No opioid analgesics from our practice. MME/day:0 mg/day   Medications ordered for procedure: Meds ordered this encounter  Medications  . lidocaine (XYLOCAINE) 2 % (with pres) injection 400 mg  . lactated ringers infusion 1,000 mL  . midazolam (VERSED) 5 MG/5ML injection 1-2 mg    Make sure Flumazenil is available in the pyxis when using this medication. If oversedation occurs, administer 0.2 mg IV over 15 sec. If after 45 sec no response, administer 0.2 mg again over 1 min; may repeat at 1 min intervals; not to exceed 4 doses (1  mg)  . fentaNYL (SUBLIMAZE) injection 25-50 mcg    Make sure Narcan is available in the pyxis when using this medication. In the event of respiratory depression (  RR< 8/min): Titrate NARCAN (naloxone) in increments of 0.1 to 0.2 mg IV at 2-3 minute intervals, until desired degree of reversal.  . ropivacaine (PF) 2 mg/mL (0.2%) (NAROPIN) injection 9 mL  . triamcinolone acetonide (KENALOG-40) injection 40 mg  . HYDROcodone-acetaminophen (NORCO/VICODIN) 5-325 MG tablet    Sig: Take 1 tablet by mouth every 6 (six) hours as needed for up to 7 days for severe pain. Must last 7 days.    Dispense:  28 tablet    Refill:  0    For acute post-operative pain. Not to be refilled. Must last 7 days.  Marland Kitchen HYDROcodone-acetaminophen (NORCO/VICODIN) 5-325 MG tablet    Sig: Take 1 tablet by mouth every 6 (six) hours as needed for up to 7 days for severe pain. Must last 7 days.    Dispense:  28 tablet    Refill:  0    For acute post-operative pain. Not to be refilled.  Must last 7 days.   Medications administered: We administered lidocaine, lactated ringers, midazolam, fentaNYL, and triamcinolone acetonide.  See the medical record for exact dosing, route, and time of administration.  Follow-up plan:   Return in about 6 weeks (around 09/08/2019) for (F2F), (PP).       Interventional management options: Considering:   Diagnostic left L1-2LESI #1   Palliative PRN treatment(s):   Palliative left thoracolumbar facet medial branch (T12, L1, L2, & L3) block #3  Palliative left thoracolumbar (T12, L1, L2, & L3) MB facet RFA #2 (last done 09/30/2018) (100/100/50) Diagnostic left lumbar facet block #2 (Diagnostic #1: 100/100/100x 1 wk)  Palliative left lumbar (L4, L5, S1) MB RFA #2 (last done 07/28/2019) Note: The patient has indicated that she has had prior lumbar facet radiofrequency ablations at [facility].    Recent Visits Date Type Provider Dept  05/28/19 Procedure visit Delano Metz, MD [facility]   Showing recent visits within past 90 days and meeting all other requirements Today's Visits Date Type Provider Dept  07/28/19 Procedure visit Delano Metz, MD [facility]  Showing today's visits and meeting all other requirements Future Appointments Date Type Provider Dept  09/14/19 Appointment Delano Metz, MD [facility]  Showing future appointments within next 90 days and meeting all other requirements  Disposition: Discharge home  Discharge (Date  Time): 07/28/2019; 1209 hrs.   Primary Care Physician: Angela Cookey, Espinoza Location: [facility] Note by: Angela Done, MD Date: 07/28/2019; Time: 12:16 PM  Disclaimer:  Medicine is not an Visual merchandiser. The only guarantee in medicine is that nothing is guaranteed. It is important to note that the decision to proceed with this intervention was based on the information collected from the patient. The Data and conclusions were drawn from the patient's questionnaire, the interview, and the physical examination. Because the information was provided in large part by the patient, it cannot be guaranteed that it has not been purposely or unconsciously manipulated. Every effort has been made to obtain as much relevant data as possible for this evaluation. It is important to note that the conclusions that lead to this procedure are derived in large part from the available data. Always take into account that the treatment will also be dependent on availability of resources and existing treatment guidelines, considered by other Pain Management Practitioners as being common knowledge and practice, at the time of the intervention. For Medico-Legal purposes, it is also important to point out that variation in procedural techniques and pharmacological choices are the  acceptable norm. The indications, contraindications, technique, and results of the above procedure should only be interpreted and  judged by a Board-Certified Interventional Pain Specialist with extensive familiarity and expertise in the same exact procedure and technique.

## 2019-07-29 ENCOUNTER — Telehealth: Payer: Self-pay

## 2019-07-29 NOTE — Telephone Encounter (Signed)
POst procedure phone call.  Patient states she is doing OK.

## 2019-09-08 ENCOUNTER — Ambulatory Visit: Payer: Medicare HMO | Admitting: Pain Medicine

## 2019-09-13 NOTE — Progress Notes (Signed)
PROVIDER NOTE: Information contained herein reflects review and annotations entered in association with encounter. Interpretation of such information and data should be left to medically-trained personnel. Information provided to patient can be located elsewhere in the medical record under "Patient Instructions". Document created using STT-dictation technology, any transcriptional errors that may result from process are unintentional.    Patient: Angela Espinoza  Service Category: E/M  Provider: Gaspar Cola, MD  DOB: 1955-05-20  DOS: 09/14/2019  Specialty: Interventional Pain Management  MRN: [id number]  Setting: Ambulatory outpatient  PCP: Gennette Pac, FNP  Type: Established Patient    Referring Provider: Gennette Pac, FNP  Location: Office  Delivery: Face-to-face     HPI  Reason for encounter: Angela Espinoza, a 64 y.o. year old female, is here today for evaluation and management of her Chronic pain syndrome [G89.4]. Angela Espinoza primary complain today is Back Pain (lower) Last encounter: Practice (07/29/2019). My last encounter with her was on 07/28/2019. Pertinent problems: Angela Espinoza has Chronic low back pain (Primary Area of Pain) (Left) w/o sciatica; Chronic pain syndrome; History of L1 kyphoplasty; Traumatic compression fracture of L1 lumbar vertebra, sequela (2016); Spondylosis without myelopathy or radiculopathy, lumbar region; Lumbar facet syndrome (Left); DDD (degenerative disc disease), lumbosacral; Thoracic facet syndrome (Left); Spondylosis without myelopathy or radiculopathy, lumbosacral region; Spondylosis without myelopathy or radiculopathy, thoracic region; DDD (degenerative disc disease), thoracolumbar; Acute postoperative pain; and Neurogenic pain on their pertinent problem list. Pain Assessment: Severity of Chronic pain is reported as a 1 /10. Location: Back Lower, Left/denies. Onset: More than a month ago. Quality: Aching. Timing: Intermittent.  Modifying factor(s): RFA, heat. Vitals:  height is 5' 2"  (1.575 m) and weight is 145 lb (65.8 kg). Her temporal temperature is 98.1 F (36.7 C). Her blood pressure is 140/83 and her pulse is 78. Her respiration is 16 and oxygen saturation is 100%.   The patient indicates doing great after the procedure.  She is currently having absolutely no pain.  The patient was instructed to simply give [place] a call when the pain returns at which time we will simply repeat the radiofrequency.  She is currently taking gabapentin 300 mg p.o. 3 times daily +600 mg p.o. at bedtime.  We will transfer this to her primary care provider.  We will see the patient on a PRN basis for repeat left lumbar facet radiofrequency ablation, as needed.  Post-Procedure Evaluation  Procedure (07/28/2019): Therapeutic left lumbar facet RFA #1 under fluoroscopic guidance and IV sedation Pre-procedure pain level: 9/10 Post-procedure: 0/10 (100% relief)  Sedation: Sedation provided.  Effectiveness during initial hour after procedure(Ultra-Short Term Relief): 100 %.  Local anesthetic used: Long-acting (4-6 hours) Effectiveness: Defined as any analgesic benefit obtained secondary to the administration of local anesthetics. This carries significant diagnostic value as to the etiological location, or anatomical origin, of the pain. Duration of benefit is expected to coincide with the duration of the local anesthetic used.  Effectiveness during initial 4-6 hours after procedure(Short-Term Relief): 100 %.  Long-term benefit: Defined as any relief past the pharmacologic duration of the local anesthetics.  Effectiveness past the initial 6 hours after procedure(Long-Term Relief): 100 %.  Current benefits: Defined as benefit that persist at this time.   Analgesia:  90-100% better Function: Angela Espinoza reports improvement in function ROM: Angela Espinoza reports improvement in ROM  Pharmacotherapy Assessment   Analgesic: No opioid analgesics  from our practice. MME/day:0 mg/day   Monitoring: [place] PMP: PDMP reviewed during this encounter.  Pharmacotherapy: No side-effects or adverse reactions reported. Compliance: No problems identified. Effectiveness: Clinically acceptable.  Hart Rochester, RN  09/14/2019 11:27 AM  Sign when Signing Visit Safety precautions to be maintained throughout the outpatient stay will include: orient to surroundings, keep bed in low position, maintain call bell within reach at all times, provide assistance with transfer out of bed and ambulation.     UDS:  Summary  Date Value Ref Range Status  12/10/2017 FINAL  Final    Comment:    ==================================================================== [facility] COMP DRUG ANALYSIS,UR ==================================================================== Test                             Result       Flag       Units Drug Present and Declared for Prescription Verification   Cyclobenzaprine                PRESENT      EXPECTED   Amitriptyline                  PRESENT      EXPECTED   Metoprolol                     PRESENT      EXPECTED Drug Absent but Declared for Prescription Verification   Desmethylcyclobenzaprine       Not Detected UNEXPECTED   Duloxetine                     Not Detected UNEXPECTED ==================================================================== Test                      Result    Flag   Units      Ref Range   Creatinine              28               mg/dL      >=20 ==================================================================== Declared Medications:  The flagging and interpretation on this report are based on the  following declared medications.  Unexpected results may arise from  inaccuracies in the declared medications.  **Note: The testing scope of this panel includes these medications:  Amitriptyline  Cyclobenzaprine  Duloxetine  Metoprolol  **Note: The testing scope of this panel does not include  following  reported medications:  Cholecalciferol  Hydrochlorothiazide  Levocetirizine  Losartan (Losartan Potassium)  Magnesium  Meloxicam  Multivitamin  Supplement  Supplement (Omega-3)  Vitamin B (Biotin)  Vitamin C ==================================================================== For clinical consultation, please call [phone]. ====================================================================      ROS  Constitutional: Denies any fever or chills Gastrointestinal: No reported hemesis, hematochezia, vomiting, or acute GI distress Musculoskeletal: Denies any acute onset joint swelling, redness, loss of ROM, or weakness Neurological: No reported episodes of acute onset apraxia, aphasia, dysarthria, agnosia, amnesia, paralysis, loss of coordination, or loss of consciousness  Medication Review  Biotin, Black Elderberry, Black Pepper-Turmeric, Calcium Carbonate-Vitamin D, Cholecalciferol, DULoxetine, Omega-3 Fatty Acids, aspirin EC, gabapentin, levocetirizine, losartan, meloxicam, metoprolol succinate, multivitamin, and vitamin C  History Review  Allergy: Angela Espinoza is allergic to indapamide. Drug: Angela Espinoza  reports no history of drug use. Alcohol:  reports current alcohol use. Tobacco:  reports that she is a non-smoker but has been exposed to tobacco smoke. She has never used smokeless tobacco. Social: Angela Espinoza  reports that she is a non-smoker but has been exposed to  tobacco smoke. She has never used smokeless tobacco. She reports current alcohol use. She reports that she does not use drugs. Medical:  has a past medical history of Anxiety, Asthma, Depression, Hyperlipidemia, Hypertension, and IBS (irritable bowel syndrome). Surgical: Angela Espinoza  has a past surgical history that includes Oophorectomy; Exploratory laparotomy; Foot surgery; Abdominal hysterectomy; Colonoscopy; Tonsillectomy; Axillary lymph node biopsy (Right, 08/03/2014); Kyphoplasty (N/A,  12/10/2014); and Breast biopsy (Left, 10/07/2018). Family: family history is not on file.  Laboratory Chemistry Profile   Renal Lab Results  Component Value Date   BUN 10 01/01/2018   CREATININE 0.88 01/01/2018   BCR 11 (L) 01/01/2018   GFRAA 81 01/01/2018   GFRNONAA 70 01/01/2018     Hepatic Lab Results  Component Value Date   AST 26 01/01/2018   ALT 42 08/10/2011   ALBUMIN 4.6 01/01/2018   ALKPHOS 73 01/01/2018     Electrolytes Lab Results  Component Value Date   NA 136 01/01/2018   K 4.5 01/01/2018   CL 98 01/01/2018   CALCIUM 9.9 01/01/2018   MG 2.3 12/10/2017   PHOS 1.6 (L) 08/10/2011     Bone Lab Results  Component Value Date   25OHVITD1 40 01/01/2018   25OHVITD2 <1.0 01/01/2018   25OHVITD3 40 01/01/2018     Inflammation (CRP: Acute Phase) (ESR: Chronic Phase) Lab Results  Component Value Date   CRP 1 12/10/2017   ESRSEDRATE 3 12/10/2017       Note: Above Lab results reviewed.  Recent Imaging Review  DG PAIN CLINIC C-ARM 1-60 MIN NO REPORT Fluoro was used, but no Radiologist interpretation will be provided.  Please refer to "NOTES" tab for provider progress note. Note: Reviewed        Physical Exam  General appearance: Well nourished, well developed, and well hydrated. In no apparent acute distress Mental status: Alert, oriented x 3 (person, place, & time)       Respiratory: No evidence of acute respiratory distress Eyes: PERLA Vitals: BP 140/83   Pulse 78   Temp 98.1 F (36.7 C) (Temporal)   Resp 16   Ht 5' 2"  (1.575 m)   Wt 145 lb (65.8 kg)   SpO2 100%   BMI 26.52 kg/m  BMI: Estimated body mass index is 26.52 kg/m as calculated from the following:   Height as of this encounter: 5' 2"  (1.575 m).   Weight as of this encounter: 145 lb (65.8 kg). Ideal: Ideal body weight: 50.1 kg (110 lb 7.2 oz) Adjusted ideal body weight: 56.4 kg (124 lb 4.3 oz)  Assessment   Status Diagnosis  Controlled Controlled Controlled 1. Chronic pain  syndrome   2. Chronic low back pain (Primary Area of Pain) (Left) w/o sciatica   3. Lumbar facet syndrome (Left)   4. Neurogenic pain      Updated Problems: No problems updated.  Plan of Care  Problem-specific:  No problem-specific Assessment & Plan notes found for this encounter.  Angela Espinoza has a current medication list which includes the following long-term medication(s): calcium carbonate-vitamin d, gabapentin, levocetirizine, losartan, and metoprolol succinate.  Pharmacotherapy (Medications Ordered): Meds ordered this encounter  Medications  . gabapentin (NEURONTIN) 300 MG capsule    Sig: Take 1 capsule (300 mg total) by mouth 3 (three) times daily AND 2 capsules (600 mg total) at bedtime.    Dispense:  450 capsule    Refill:  0    Fill one day early if pharmacy is closed on scheduled refill  date. May substitute for generic if available.   Orders:  No orders of the defined types were placed in this encounter.  Follow-up plan:   Return if symptoms worsen or fail to improve.      Interventional management options: Considering:   Diagnostic left L1-2LESI #1   Palliative PRN treatment(s):   Palliative left thoracolumbar facet medial branch (T12, L1, L2, & L3) block #3  Palliative left thoracolumbar (T12, L1, L2, & L3) MB facet RFA #2 (last done 09/30/2018) (100/100/50) Diagnostic left lumbar facet block #2 (Diagnostic #1: 100/100/100x 1 wk)  Palliative left lumbar (L4, L5, S1) MB RFA #2 (last done 07/28/2019) (100/100/100/100) Note: The patient has indicated that she has had prior lumbar facet radiofrequency ablations at [facility].    Recent Visits Date Type Provider Dept  07/28/19 Procedure visit Milinda Pointer, MD [facility]  Showing recent visits within past 90 days and meeting all other requirements Today's Visits Date Type Provider Dept  09/14/19 Office Visit Milinda Pointer, MD [facility]  Showing today's visits and  meeting all other requirements Future Appointments No visits were found meeting these conditions. Showing future appointments within next 90 days and meeting all other requirements  I discussed the assessment and treatment plan with the patient. The patient was provided an opportunity to ask questions and all were answered. The patient agreed with the plan and demonstrated an understanding of the instructions.  Patient advised to call back or seek an in-person evaluation if the symptoms or condition worsens.  Duration of encounter: 20 minutes.  Note by: Gaspar Cola, MD Date: 09/14/2019; Time: 11:53 AM

## 2019-09-14 ENCOUNTER — Encounter: Payer: Self-pay | Admitting: Pain Medicine

## 2019-09-14 ENCOUNTER — Other Ambulatory Visit: Payer: Self-pay

## 2019-09-14 ENCOUNTER — Ambulatory Visit: Payer: Medicare HMO | Attending: Pain Medicine | Admitting: Pain Medicine

## 2019-09-14 VITALS — BP 140/83 | HR 78 | Temp 98.1°F | Resp 16 | Ht 62.0 in | Wt 145.0 lb

## 2019-09-14 DIAGNOSIS — M545 Low back pain, unspecified: Secondary | ICD-10-CM

## 2019-09-14 DIAGNOSIS — G8929 Other chronic pain: Secondary | ICD-10-CM | POA: Insufficient documentation

## 2019-09-14 DIAGNOSIS — G894 Chronic pain syndrome: Secondary | ICD-10-CM | POA: Diagnosis not present

## 2019-09-14 DIAGNOSIS — M792 Neuralgia and neuritis, unspecified: Secondary | ICD-10-CM | POA: Insufficient documentation

## 2019-09-14 DIAGNOSIS — M47816 Spondylosis without myelopathy or radiculopathy, lumbar region: Secondary | ICD-10-CM | POA: Diagnosis not present

## 2019-09-14 MED ORDER — GABAPENTIN 300 MG PO CAPS: ORAL_CAPSULE | ORAL | 0 refills | Status: DC

## 2019-09-14 NOTE — Progress Notes (Signed)
Safety precautions to be maintained throughout the outpatient stay will include: orient to surroundings, keep bed in low position, maintain call bell within reach at all times, provide assistance with transfer out of bed and ambulation.  

## 2019-09-16 ENCOUNTER — Ambulatory Visit
Admission: RE | Admit: 2019-09-16 | Discharge: 2019-09-16 | Disposition: A | Discharge status: Home / Self Care | Payer: Medicare HMO | Source: Ambulatory Visit | Attending: Family Medicine | Admitting: Family Medicine

## 2019-09-16 ENCOUNTER — Other Ambulatory Visit: Payer: Self-pay

## 2019-09-16 DIAGNOSIS — Z1231 Encounter for screening mammogram for malignant neoplasm of breast: Secondary | ICD-10-CM | POA: Insufficient documentation

## 2019-09-16 DIAGNOSIS — N6489 Other specified disorders of breast: Secondary | ICD-10-CM | POA: Insufficient documentation

## 2019-09-16 DIAGNOSIS — R928 Other abnormal and inconclusive findings on diagnostic imaging of breast: Secondary | ICD-10-CM

## 2019-09-22 ENCOUNTER — Encounter: Payer: Self-pay | Admitting: *Deleted

## 2019-09-22 NOTE — Progress Notes (Signed)
Patient called with concerns regarding her abnormal mammogram.  She has a bi-rads 4 mammo showing asymmetry and distortion with recommendations of excision of the distortion.  She had discussed with her pcp yesterday.  I reviewed results and recommendations with patient.  States she would like to see Dr. Toula Moos for surgical consultation.  I have scheduled her to see him on 09/24/19 @ 2:00.  Requested that she let her pcp know that the referral had been made.  She is agreeable to the plan.

## 2019-09-24 ENCOUNTER — Ambulatory Visit: Payer: Medicare HMO | Admitting: Surgery

## 2019-09-24 ENCOUNTER — Encounter: Payer: Self-pay | Admitting: Surgery

## 2019-09-24 ENCOUNTER — Other Ambulatory Visit: Payer: Self-pay

## 2019-09-24 VITALS — BP 157/85 | HR 66 | Temp 98.3°F | Ht 62.0 in | Wt 148.0 lb

## 2019-09-24 DIAGNOSIS — R928 Other abnormal and inconclusive findings on diagnostic imaging of breast: Secondary | ICD-10-CM | POA: Insufficient documentation

## 2019-09-24 NOTE — Progress Notes (Signed)
Patient ID: Leafy KindleBrenda Smith Espinoza, female   DOB: 1955/11/26, 64 y.o.   MRN: [id number]  Chief Complaint: Abnormal left mammogram  History of Present Illness Leafy KindleBrenda Smith Espinoza is a 64 y.o. female with an asymmetric area on a screening mammogram back in September 2020.  A biopsy was obtained which was clearly benign with primarily fatty tissue.  Follow-up short interval imaging in April showed no new changes.  However repeated imaging currently, show a persistent distortion/asymmetry.  Patient has utilized birth control in the past, underwent a hysterectomy at the age of 64.  She has no family history of breast cancer.  She was pregnant twice, she breast-fed.  Has felt no lump, had nipple discharge, noted any skin changes, had any breast pain.  Past Medical History Past Medical History:  Diagnosis Date  . Anxiety   . Asthma    NO INHALER-SMOKE AND PERFUME TRIGGERS ASTHMA  . Depression   . Hyperlipidemia   . Hypertension   . IBS (irritable bowel syndrome)       Past Surgical History:  Procedure Laterality Date  . ABDOMINAL HYSTERECTOMY  age 64  . AXILLARY LYMPH NODE BIOPSY Right 08/03/2014   Procedure: EXCISION RIGHT  AXILLARY LIPOMATOSIS;  Surgeon: Nadeen LandauJarvis Wilton Smith, MD;  Location: [facility] ORS;  Service: General;  Laterality: Right;  . BREAST BIOPSY Left 10/07/2018   Stereo, Coil Clip, pending path  . COLONOSCOPY    . EXPLORATORY LAPAROTOMY    . FOOT SURGERY    . KYPHOPLASTY N/A 12/10/2014   Procedure: KYPHOPLASTY L 1;  Surgeon: Kennedy BuckerMichael Menz, MD;  Location: [facility] ORS;  Service: Orthopedics;  Laterality: N/A;  . OOPHORECTOMY    . TONSILLECTOMY      Allergies  Allergen Reactions  . Indapamide Other (See Comments)    tingling all over body    Current Outpatient Medications  Medication Sig Dispense Refill  . aspirin EC 81 MG tablet Take 81 mg by mouth every other day. Swallow whole.    . Biotin 1000 MCG CHEW Chew 1,000 mcg by mouth daily.     Marland Kitchen. BLACK ELDERBERRY PO Take 1  capsule by mouth daily.     . Black Pepper-Turmeric (TURMERIC COMPLEX/BLACK PEPPER) 3-500 MG CAPS Take by mouth.    . Calcium Carbonate-Vitamin D (CALCIUM 500 + D PO) Take 1 tablet by mouth daily.    . Cholecalciferol (D3-1000) 25 MCG (1000 UT) tablet Take 1,000 Units by mouth daily.    . DULoxetine (CYMBALTA) 30 MG capsule Take 30 mg by mouth 2 (two) times daily.    Marland Kitchen. gabapentin (NEURONTIN) 300 MG capsule Take 1 capsule (300 mg total) by mouth 3 (three) times daily AND 2 capsules (600 mg total) at bedtime. 450 capsule 0  . HYDROcodone-acetaminophen (NORCO/VICODIN) 5-325 MG tablet Take 1 tablet by mouth every 6 (six) hours as needed.    Marland Kitchen. levocetirizine (XYZAL) 5 MG tablet Take by mouth.    . losartan (COZAAR) 25 MG tablet Take 25 mg by mouth every morning.    . meloxicam (MOBIC) 15 MG tablet Take 15 mg by mouth daily.    . metoprolol succinate (TOPROL-XL) 25 MG 24 hr tablet Take 25 mg by mouth every morning.    . Multiple Vitamin (MULTIVITAMIN) capsule Take 1 capsule by mouth daily.    . Omega-3 Fatty Acids (FISH OIL PO) Take 1 tablet by mouth daily.    . vitamin C (ASCORBIC ACID) 500 MG tablet Take 1,000 mg by mouth daily.  No current facility-administered medications for this visit.    Family History Family History  Problem Relation Age of Onset  . Heart attack Mother   . Lung cancer Father   . Breast cancer Neg Hx       Social History Social History   Tobacco Use  . Smoking status: Passive Smoke Exposure - Never Smoker  . Smokeless tobacco: Never Used  . Tobacco comment: Parents were smokers   Vaping Use  . Vaping Use: Never used  Substance Use Topics  . Alcohol use: Yes    Comment: occ-wine  . Drug use: No        Review of Systems  Constitutional: Negative.   HENT: Negative.   Eyes: Negative.   Respiratory: Negative.   Cardiovascular: Negative.   Gastrointestinal: Negative.   Genitourinary: Negative.   Skin: Negative.   Neurological: Negative.    Psychiatric/Behavioral: Positive for depression.      Physical Exam Blood pressure (!) 157/85, pulse 66, temperature 98.3 F (36.8 C), height 5\' 2"  (1.575 m), weight 148 lb (67.1 kg), SpO2 99 %. Last Weight  Most recent update: 09/24/2019  2:15 PM   Weight  67.1 kg (148 lb)            CONSTITUTIONAL: Well developed, and nourished, appropriately responsive and aware without distress.   EYES: Sclera non-icteric.   EARS, NOSE, MOUTH AND THROAT: Mask worn.    Hearing is intact to voice.  NECK: Trachea is midline, and there is no jugular venous distension.  LYMPH NODES:  Lymph nodes in the neck are not enlarged. RESPIRATORY:  Lungs are clear, and breath sounds are equal bilaterally. Normal respiratory effort without pathologic use of accessory muscles. CARDIOVASCULAR: Heart is regular in rate and rhythm. GI: The abdomen is soft, nontender, and nondistended.  GU: Bilateral breast exam are unremarkable for any suspicious or dominant nodularity or masses.  I do not appreciate any skin changes, nipple discharge or significant asymmetry. MUSCULOSKELETAL:  Symmetrical muscle tone appreciated in all four extremities.    SKIN: Skin turgor is normal. No pathologic skin lesions appreciated.  NEUROLOGIC:  Motor and sensation appear grossly normal.  Cranial nerves are grossly without defect. PSYCH:  Alert and oriented to person, place and time. Affect is appropriate for situation.  Data Reviewed I have personally reviewed what is currently available of the patient's imaging, recent labs and medical records.   Labs:  CBC Latest Ref Rng & Units 08/12/2011 08/11/2011 08/10/2011  WBC 3.6 - 11.0 x10 3/mm 3 4.5 4.9 5.7  Hemoglobin 12.0 - 16.0 g/dL 10/11/2011 76.2 83.1  Hematocrit 35.0 - 47.0 % 35.0 37.2 36.8  Platelets 150 - 440 x10 3/mm 3 255 302 303   CMP Latest Ref Rng & Units 01/01/2018 08/12/2011 08/11/2011  Glucose 65 - 99 mg/dL 92 88 94  BUN 8 - 27 mg/dL 10 5(L) 5(L)  Creatinine 0.57 - 1.00 mg/dL 08/13/2011  6.16 0.73  Sodium 134 - 144 mmol/L 136 138 132(L)  Potassium 3.5 - 5.2 mmol/L 4.5 4.4 3.2(L)  Chloride 96 - 106 mmol/L 98 104 94(L)  CO2 21 - 32 mmol/L - 26 31  Calcium 8.7 - 10.3 mg/dL 9.9 8.5 9.0  Total Protein 6.0 - 8.5 g/dL 6.9 - -  Total Bilirubin 0.0 - 1.2 mg/dL 0.2 - -  Alkaline Phos 39 - 117 IU/L 73 - -  AST 0 - 40 IU/L 26 - -  ALT U/L - - -      Imaging: CLINICAL  DATA:  64 year old female who underwent stereotactic biopsy of the left breast on 10/07/2018. Pathology revealed benign breast tissue with areas containing predominance of adipose tissue negative for atypia or malignancy. Follow-up study.  EXAM: DIGITAL DIAGNOSTIC BILATERAL MAMMOGRAM WITH CAD AND TOMO  ULTRASOUND LEFT BREAST  COMPARISON:  Previous exam(s).  ACR Breast Density Category c: The breast tissue is heterogeneously dense, which may obscure small masses.  FINDINGS: No suspicious mass, malignant type microcalcifications or distortion detected in the right breast.  There is a coil shaped clip in the upper-outer quadrant of the left breast. There continues to be persistent asymmetry and distortion in this area. No additional abnormality seen in the left breast.  Mammographic images were processed with CAD.  On physical exam, I do not palpate a mass in the upper-outer quadrant of the left breast.  Targeted ultrasound is performed, showing normal tissue in the upper-outer quadrant of the left breast. Sonographic evaluation of the left axilla does not show any enlarged adenopathy.  IMPRESSION: Persistent asymmetry and distortion in the upper-outer quadrant of the left breast.  RECOMMENDATION: Surgical excision of the asymmetry and distortion in the upper-outer quadrant of the left breast is recommended.  I have discussed the findings and recommendations with the patient. If applicable, a reminder letter will be sent to the patient regarding the next appointment.  BI-RADS  CATEGORY  4: Suspicious.   Electronically Signed   By: Baird Lyons M.D.   On: 09/16/2019 15:16  Assessment    Asymmetry/distortion localized upper outer quadrant left breast.  Previously biopsied benign.  Follow-up imaging x2 did not appear to show any significant change or progression to my nonradiologist eye.     Patient Active Problem List   Diagnosis Date Noted  . Acid reflux 12/03/2018  . Neurogenic pain 11/03/2018  . Thoracic facet syndrome (Left) 09/30/2018  . Spondylosis without myelopathy or radiculopathy, lumbosacral region 09/30/2018  . Spondylosis without myelopathy or radiculopathy, thoracic region 09/30/2018  . DDD (degenerative disc disease), thoracolumbar 09/30/2018  . Acute postoperative pain 09/30/2018  . DDD (degenerative disc disease), lumbosacral 07/24/2018  . History of L1 kyphoplasty 01/01/2018  . Traumatic compression fracture of L1 lumbar vertebra, sequela (2016) 01/01/2018  . Spondylosis without myelopathy or radiculopathy, lumbar region 01/01/2018  . Lumbar facet syndrome (Left) 01/01/2018  . Allergy 12/10/2017  . Anxiety 12/10/2017  . Asthma without status asthmaticus 12/10/2017  . Depression 12/10/2017  . Hypertension 12/10/2017  . Chronic low back pain (Primary Area of Pain) (Left) w/o sciatica 12/10/2017  . Chronic pain syndrome 12/10/2017  . Pharmacologic therapy 12/10/2017  . Disorder of skeletal system 12/10/2017  . Problems influencing health status 12/10/2017  . Postmenopause 06/02/2014    Plan    We discussed at length the risks of and unappreciated associated precancerous lesion or a cancer that reveals her to be at higher risk of developing breast cancer in the future.  I believe is an average of a 15% risk.  She does not feel this significant to warrant proceeding with an excisional biopsy at this time.  She would much rather compromise with a repeat stereotactic biopsy and close follow-up.  I do not feel this is terribly unreasonable  considering the fact that she is quite reliable and will follow up both in short interval and in the usual 59-month interval.  I have made a request through chat to the radiologist who read her last imaging, to see if they would quantitate their degree of suspicion or proceed with  a repeat stereotactic biopsy of this region of suspicion.  I will be glad to see her back again for clinical examination in 3 months if nothing different is done, otherwise I will help coordinate repeat stereotactic biopsy.  Face-to-face time spent with the patient and accompanying care providers(if present) was 30 minutes, with more than 50% of the time spent counseling, educating, and coordinating care of the patient.      Campbell Lerner M.D., FACS 09/24/2019, 3:06 PM

## 2019-09-24 NOTE — Patient Instructions (Addendum)
Follow up here in 3 months with an exam. We will let you know if Radiology recommends another biopsy or not.    Please call and ask to speak with a nurse if you develop questions or concerns.

## 2019-10-08 ENCOUNTER — Telehealth: Payer: Self-pay

## 2019-10-08 NOTE — Telephone Encounter (Signed)
Patient states she was instructed to have a mammogram in 3 months- Dr.rodenberg was going to follow up with the radiologist and call patient. Patient would like a call back.

## 2019-10-08 NOTE — Telephone Encounter (Signed)
I spoke with the patient and let her know that Dr Claudine Mouton has has made multiple attempts to contact the reading Radiologist to see if he can get more concrete information on any changes observed with the mammogram. He has not gotten any response back. His suggestion and the patient agrees is to have another Stereotactic biopsy done of the breast. The patient is not willing to have a surgical biopsy at this time, but is willing to have a Stereo Biopsy done. She is aware that we will be in contact with her about this.

## 2019-10-09 ENCOUNTER — Other Ambulatory Visit: Payer: Self-pay | Admitting: Surgery

## 2019-10-09 DIAGNOSIS — R928 Other abnormal and inconclusive findings on diagnostic imaging of breast: Secondary | ICD-10-CM

## 2019-10-09 DIAGNOSIS — N6489 Other specified disorders of breast: Secondary | ICD-10-CM

## 2019-10-21 ENCOUNTER — Ambulatory Visit
Admission: RE | Admit: 2019-10-21 | Discharge: 2019-10-21 | Disposition: A | Discharge status: Home / Self Care | Payer: Medicare HMO | Source: Ambulatory Visit | Attending: Surgery | Admitting: Surgery

## 2019-10-21 ENCOUNTER — Other Ambulatory Visit: Payer: Self-pay

## 2019-10-21 DIAGNOSIS — N6489 Other specified disorders of breast: Secondary | ICD-10-CM | POA: Diagnosis present

## 2019-10-21 DIAGNOSIS — R928 Other abnormal and inconclusive findings on diagnostic imaging of breast: Secondary | ICD-10-CM

## 2019-10-21 HISTORY — PX: BREAST BIOPSY: SHX20

## 2019-10-22 LAB — SURGICAL PATHOLOGY

## 2020-01-06 ENCOUNTER — Other Ambulatory Visit: Payer: Self-pay

## 2020-01-06 ENCOUNTER — Ambulatory Visit: Payer: Medicare HMO | Attending: Pain Medicine | Admitting: Pain Medicine

## 2020-01-06 ENCOUNTER — Encounter: Payer: Self-pay | Admitting: Pain Medicine

## 2020-01-06 VITALS — BP 138/81 | HR 86 | Temp 97.4°F | Resp 16 | Ht 62.0 in | Wt 140.0 lb

## 2020-01-06 DIAGNOSIS — G8929 Other chronic pain: Secondary | ICD-10-CM | POA: Insufficient documentation

## 2020-01-06 DIAGNOSIS — M545 Low back pain, unspecified: Secondary | ICD-10-CM | POA: Insufficient documentation

## 2020-01-06 DIAGNOSIS — S32010S Wedge compression fracture of first lumbar vertebra, sequela: Secondary | ICD-10-CM | POA: Insufficient documentation

## 2020-01-06 DIAGNOSIS — M5135 Other intervertebral disc degeneration, thoracolumbar region: Secondary | ICD-10-CM | POA: Diagnosis present

## 2020-01-06 DIAGNOSIS — M792 Neuralgia and neuritis, unspecified: Secondary | ICD-10-CM | POA: Insufficient documentation

## 2020-01-06 DIAGNOSIS — M47816 Spondylosis without myelopathy or radiculopathy, lumbar region: Secondary | ICD-10-CM | POA: Diagnosis present

## 2020-01-06 DIAGNOSIS — M5137 Other intervertebral disc degeneration, lumbosacral region: Secondary | ICD-10-CM | POA: Insufficient documentation

## 2020-01-06 NOTE — Progress Notes (Signed)
PROVIDER NOTE: Information contained herein reflects review and annotations entered in association with encounter. Interpretation of such information and data should be left to medically-trained personnel. Information provided to patient can be located elsewhere in the medical record under "Patient Instructions". Document created using STT-dictation technology, any transcriptional errors that may result from process are unintentional.    Patient: Angela Espinoza  Service Category: E/M  Provider: Gaspar Cola, MD  DOB: [birth year]  DOS: 01/06/2020  Specialty: Interventional Pain Management  MRN: [id number]  Setting: Ambulatory outpatient  PCP: Gennette Pac, FNP  Type: Established Patient    Referring Provider: Gennette Pac, FNP  Location: Office  Delivery: Face-to-face     HPI  Angela Espinoza, a 64 y.o. year old female, is here today because of her Chronic left-sided low back pain without sciatica [M54.50, G89.29]. Angela Espinoza primary complain today is Back Pain (L!) Last encounter: My last encounter with her was on 09/14/2019. Pertinent problems: Angela Espinoza has Chronic low back pain (Primary Area of Pain) (Left) w/o sciatica; Chronic pain syndrome; History of L1 kyphoplasty; Traumatic compression fracture of L1 lumbar vertebra, sequela (2016); Spondylosis without myelopathy or radiculopathy, lumbar region; Lumbar facet syndrome (Left); DDD (degenerative disc disease), lumbosacral; Thoracic facet syndrome (Left); Spondylosis without myelopathy or radiculopathy, lumbosacral region; Spondylosis without myelopathy or radiculopathy, thoracic region; DDD (degenerative disc disease), thoracolumbar; Acute postoperative pain; and Neurogenic pain on their pertinent problem list. Pain Assessment: Severity of Chronic pain is reported as a 0-No pain/10. Location: Back Lower/denies. Onset: More than a month ago. Quality:  (Denies). Timing: Rarely. Modifying factor(s): natures balance  braclet, walking, no sugar. Vitals:  height is _0  (1.575 m) and weight is 140 lb (63.5 kg). Her temperature is 97.4 F (36.3 C) (abnormal). Her blood pressure is 138/81 and her pulse is 86. Her respiration is 16 and oxygen saturation is 99%.   Reason for encounter: medication management.  I was prescribing gabapentin (Neurontin) 300 mg capsule, 1 cap p.o. 3 times daily and 2 caps (600 mg) p.o. at bedtime.  Last written on 09/14/2019.  The prescription had enough for the patient to take until 12/13/2019.  Patient describes that she still has some left because she has not needed to take it.  Medication reconciliation was states that the patient wants the medication removed from the list "don't need now."  The patient also requested removal of the meloxicam (Mobic) 15 mg tablet p.o. daily and the hydrocodone/APAP (Norco) 5/325 1 tablet p.o. every 6 hours.  Both of them the patient indicates "don't need."  She actually came in today just to let me know that she is no longer taking any of the medicines.  She was able to come off of the gabapentin, the Mobic, and the hydrocodone.  She refers that she started receiving "red light" therapy by a chiropractor, she found that that diet drinks actually can cause inflammation and she has stopped using most of them.  She has switched to taking Zevia, a drink that is marketed as a 0 sugar, 0-calorie, none phosphoric acid drink with no artificial sweeteners.  The drink is supposed to be naturally sweetened with Stevia plant.  He has no callers added to it and it is supposed to be a non-GMO verified/certified drink.  She has also started walking, has lost weight, and she is also using a magnet bracelet on her left wrist.  With this combination the patient indicates that she is having absolutely no pain.  I am  extremely happy for her and I have recommended that she continue doing what ever she is doing right now and to stay away from the medicines, if she does not need them.   I told her that I would be more than happy to see her back if she develops any pain problems in the future that she may want me to evaluate.  Of course we also did a left T12, L1, L2, and L3 medial branch radiofrequency ablation on 09/30/2018, followed by a left-sided L4, L5, and S1 medial branch radiofrequency ablation on 07/28/2019.   Pharmacotherapy Assessment   Analgesic: No opioid analgesics from our practice. MME/day:0 mg/day   Monitoring: [place] PMP: PDMP reviewed during this encounter.       Pharmacotherapy: No side-effects or adverse reactions reported. Compliance: No problems identified. Effectiveness: Clinically acceptable.  Ignatius Specking, RN  01/06/2020 11:36 AM  Sign when Signing Visit Safety precautions to be maintained throughout the outpatient stay will include: orient to surroundings, keep bed in low position, maintain call bell within reach at all times, provide assistance with transfer out of bed and ambulation.     UDS:  Summary  Date Value Ref Range Status  12/10/2017 FINAL  Final    Comment:    ==================================================================== [facility] COMP DRUG ANALYSIS,UR ==================================================================== Test                             Result       Flag       Units Drug Present and Declared for Prescription Verification   Cyclobenzaprine                PRESENT      EXPECTED   Amitriptyline                  PRESENT      EXPECTED   Metoprolol                     PRESENT      EXPECTED Drug Absent but Declared for Prescription Verification   Desmethylcyclobenzaprine       Not Detected UNEXPECTED   Duloxetine                     Not Detected UNEXPECTED ==================================================================== Test                      Result    Flag   Units      Ref Range   Creatinine              28               mg/dL       >=20 ==================================================================== Declared Medications:  The flagging and interpretation on this report are based on the  following declared medications.  Unexpected results may arise from  inaccuracies in the declared medications.  **Note: The testing scope of this panel includes these medications:  Amitriptyline  Cyclobenzaprine  Duloxetine  Metoprolol  **Note: The testing scope of this panel does not include following  reported medications:  Cholecalciferol  Hydrochlorothiazide  Levocetirizine  Losartan (Losartan Potassium)  Magnesium  Meloxicam  Multivitamin  Supplement  Supplement (Omega-3)  Vitamin B (Biotin)  Vitamin C ==================================================================== For clinical consultation, please call [phone]. ====================================================================      ROS  Constitutional: Denies any fever or chills Gastrointestinal: No reported hemesis, hematochezia, vomiting, or  acute GI distress Musculoskeletal: Denies any acute onset joint swelling, redness, loss of ROM, or weakness Neurological: No reported episodes of acute onset apraxia, aphasia, dysarthria, agnosia, amnesia, paralysis, loss of coordination, or loss of consciousness  Medication Review  Biotin, Black Elderberry, Black Pepper-Turmeric, Calcium Carb-Cholecalciferol, Cholecalciferol, DULoxetine, Omega-3 Fatty Acids, acetaminophen, levocetirizine, losartan, metoprolol succinate, multivitamin, and vitamin C  History Review  Allergy: Angela Espinoza is allergic to indapamide. Drug: Angela Espinoza  reports no history of drug use. Alcohol:  reports current alcohol use. Tobacco:  reports that she is a non-smoker but has been exposed to tobacco smoke. She has never used smokeless tobacco. Social: Angela Espinoza  reports that she is a non-smoker but has been exposed to tobacco smoke. She has never used smokeless tobacco. She  reports current alcohol use. She reports that she does not use drugs. Medical:  has a past medical history of Anxiety, Asthma, Depression, Hyperlipidemia, Hypertension, and IBS (irritable bowel syndrome). Surgical: Angela Espinoza  has a past surgical history that includes Oophorectomy; Exploratory laparotomy; Foot surgery; Colonoscopy; Tonsillectomy; Axillary lymph node biopsy (Right, 08/03/2014); Kyphoplasty (N/A, 12/10/2014); Abdominal hysterectomy (age 25); Breast biopsy (Left, 10/07/2018); and Breast biopsy (Left, 10/21/2019). Family: family history includes Heart attack in her mother; Lung cancer in her father.  Laboratory Chemistry Profile   Renal Lab Results  Component Value Date   BUN 10 01/01/2018   CREATININE 0.88 01/01/2018   BCR 11 (L) 01/01/2018   GFRAA 81 01/01/2018   GFRNONAA 70 01/01/2018     Hepatic Lab Results  Component Value Date   AST 26 01/01/2018   ALT 42 08/10/2011   ALBUMIN 4.6 01/01/2018   ALKPHOS 73 01/01/2018     Electrolytes Lab Results  Component Value Date   NA 136 01/01/2018   K 4.5 01/01/2018   CL 98 01/01/2018   CALCIUM 9.9 01/01/2018   MG 2.3 12/10/2017   PHOS 1.6 (L) 08/10/2011     Bone Lab Results  Component Value Date   25OHVITD1 40 01/01/2018   25OHVITD2 <1.0 01/01/2018   25OHVITD3 40 01/01/2018     Inflammation (CRP: Acute Phase) (ESR: Chronic Phase) Lab Results  Component Value Date   CRP 1 12/10/2017   ESRSEDRATE 3 12/10/2017       Note: Above Lab results reviewed.  Recent Imaging Review  MM LT BREAST BX W LOC DEV 1ST LESION IMAGE BX SPEC STEREO GUIDE Addendum: ADDENDUM REPORT: 10/23/2019 12:54   ADDENDUM:  PATHOLOGY revealed: A. 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 - CHANGES COMPATIBLE WITH PRIOR BIOPSY SITE.  - NEGATIVE FOR ATYPICAL PROLIFERATIVE BREAST DISEASE.   Pathology results are CONCORDANT with imaging findings, per  Dr.  Kristopher Oppenheim.   Pathology results and recommendations below were discussed with  patient by telephone on 10/23/2019. Patient reported biopsy site  within normal limits with slight tenderness at the site. Post biopsy  care instructions were reviewed, questions were answered and my  direct phone number was provided to patient. Patient was instructed  to call [facility] if any concerns or questions arise  related to the biopsy.   Recommendation: Patient instructed to return for single six month  LEFT breast diagnostic mammogram and possible ultrasound to ensure  stability of biopsied area. Assuming no suspicious changes at this  time, the patient may subsequently return to screening mammography.   Pathology results reported by Electa Sniff RN on 10/23/2019.   Electronically  Signed    By: Kristopher Oppenheim M.D.    On: 10/23/2019 12:54 Narrative: CLINICAL DATA:  64 year old female with an indeterminate left breast asymmetry.  EXAM: LEFT BREAST STEREOTACTIC CORE NEEDLE BIOPSY  COMPARISON:  Previous exams.  FINDINGS: The patient and I discussed the procedure of stereotactic-guided biopsy including benefits and alternatives. We discussed the high likelihood of a successful procedure. We discussed the risks of the procedure including infection, bleeding, tissue injury, clip migration, and inadequate sampling. Informed written consent was given. The usual time out protocol was performed immediately prior to the procedure.  Using sterile technique and 1% Lidocaine as local anesthetic, under stereotactic guidance, a 9 gauge vacuum assisted device was used to perform core needle biopsy of an asymmetry in the upper-outer quadrant of the left breast using a lateral approach.  Lesion quadrant: Upper outer quadrant  At the conclusion of the procedure, ribbon shaped tissue marker clip was deployed into the biopsy cavity. Follow-up 2-view mammogram was performed and dictated  separately.  IMPRESSION: Stereotactic-guided biopsy of the left breast. No apparent complications.  Electronically Signed: By: Kristopher Oppenheim M.D. On: 10/21/2019 13:36 Note: Reviewed        Physical Exam  General appearance: Well nourished, well developed, and well hydrated. In no apparent acute distress Mental status: Alert, oriented x 3 (person, place, & time)       Respiratory: No evidence of acute respiratory distress Eyes: PERLA Vitals: BP 138/81   Pulse 86   Temp (!) 97.4 F (36.3 C)   Resp 16   Ht _0  (1.575 m)   Wt 140 lb (63.5 kg)   SpO2 99%   BMI 25.61 kg/m  BMI: Estimated body mass index is 25.61 kg/m as calculated from the following:   Height as of this encounter: _1  (1.575 m).   Weight as of this encounter: 140 lb (63.5 kg). Ideal: Ideal body weight: 50.1 kg (110 lb 7.2 oz) Adjusted ideal body weight: 55.5 kg (122 lb 4.3 oz)  Assessment   Status Diagnosis  Resolved Resolved Resolved 1. Chronic low back pain (Primary Area of Pain) (Left) w/o sciatica   2. Lumbar facet syndrome (Left)   3. Neurogenic pain   4. Traumatic compression fracture of L1 lumbar vertebra, sequela (2016)   5. DDD (degenerative disc disease), lumbosacral   6. DDD (degenerative disc disease), thoracolumbar      Updated Problems: No problems updated.  Plan of Care  Problem-specific:  No problem-specific Assessment & Plan notes found for this encounter.  Angela Espinoza has a current medication list which includes the following long-term medication(s): calcium carb-cholecalciferol, levocetirizine, losartan, and metoprolol succinate.  Pharmacotherapy (Medications Ordered): No orders of the defined types were placed in this encounter.  Orders:  No orders of the defined types were placed in this encounter.  Follow-up plan:   Return if symptoms worsen or fail to improve.      Interventional Therapies  Risk  Complexity Considerations:   WNL   Planned   Pending:   Pending further evaluation   Under consideration:   Diagnostic left L1-2LESI #1   Completed:   Diagnostic left thoracolumbar facet MBB (T12, L1, L2, & L3) block x2  Therapeutic left thoracolumbar (T12, L1, L2, & L3) MB RFA x1 (done 09/30/2018) (100/100/50) Diagnostic left lumbar facet MBB x1 (Diagnostic #1: 100/100/100x 1 wk)  Therapeutic left lumbar (L4, L5, S1) MB RFA x1 (done 07/28/2019) (100/100/100/100) Note: The patient has indicated that she has had prior  lumbar facet radiofrequency ablations at [facility].   Palliative options:   Palliative left thoracolumbar (T12, L1, L2, & L3) MB RFA #2 (last 09/30/2018)  Palliative left lumbar (L4, L5, S1) MB RFA #2 (last 07/28/2019)     Recent Visits No visits were found meeting these conditions. Showing recent visits within past 90 days and meeting all other requirements Today's Visits Date Type Provider Dept  01/06/20 Office Visit Milinda Pointer, MD [facility]  Showing today's visits and meeting all other requirements Future Appointments No visits were found meeting these conditions. Showing future appointments within next 90 days and meeting all other requirements  I discussed the assessment and treatment plan with the patient. The patient was provided an opportunity to ask questions and all were answered. The patient agreed with the plan and demonstrated an understanding of the instructions.  Patient advised to call back or seek an in-person evaluation if the symptoms or condition worsens.  Duration of encounter: 39 minutes.  Note by: Gaspar Cola, MD Date: 01/06/2020; Time: 12:04 PM

## 2020-01-06 NOTE — Progress Notes (Signed)
Safety precautions to be maintained throughout the outpatient stay will include: orient to surroundings, keep bed in low position, maintain call bell within reach at all times, provide assistance with transfer out of bed and ambulation.  

## 2020-03-02 ENCOUNTER — Other Ambulatory Visit: Payer: Self-pay | Admitting: Surgery

## 2020-03-02 DIAGNOSIS — R928 Other abnormal and inconclusive findings on diagnostic imaging of breast: Secondary | ICD-10-CM

## 2020-03-21 ENCOUNTER — Telehealth: Payer: Self-pay

## 2020-03-21 NOTE — Telephone Encounter (Signed)
Pt states she wants a Injection, Active Requests have PRN order

## 2020-03-29 ENCOUNTER — Other Ambulatory Visit: Payer: Self-pay

## 2020-03-29 ENCOUNTER — Ambulatory Visit (HOSPITAL_BASED_OUTPATIENT_CLINIC_OR_DEPARTMENT_OTHER): Payer: Medicare HMO | Admitting: Pain Medicine

## 2020-03-29 ENCOUNTER — Encounter: Payer: Self-pay | Admitting: Pain Medicine

## 2020-03-29 ENCOUNTER — Ambulatory Visit
Admission: RE | Admit: 2020-03-29 | Discharge: 2020-03-29 | Disposition: A | Discharge status: Home / Self Care | Payer: Medicare HMO | Source: Ambulatory Visit | Attending: Pain Medicine | Admitting: Pain Medicine

## 2020-03-29 VITALS — BP 100/65 | HR 64 | Temp 97.9°F | Resp 18 | Ht 62.0 in | Wt 140.0 lb

## 2020-03-29 DIAGNOSIS — G8929 Other chronic pain: Secondary | ICD-10-CM | POA: Diagnosis present

## 2020-03-29 DIAGNOSIS — M5137 Other intervertebral disc degeneration, lumbosacral region: Secondary | ICD-10-CM

## 2020-03-29 DIAGNOSIS — M47816 Spondylosis without myelopathy or radiculopathy, lumbar region: Secondary | ICD-10-CM | POA: Diagnosis present

## 2020-03-29 DIAGNOSIS — M47817 Spondylosis without myelopathy or radiculopathy, lumbosacral region: Secondary | ICD-10-CM

## 2020-03-29 DIAGNOSIS — M47814 Spondylosis without myelopathy or radiculopathy, thoracic region: Secondary | ICD-10-CM

## 2020-03-29 DIAGNOSIS — M545 Low back pain, unspecified: Secondary | ICD-10-CM | POA: Diagnosis present

## 2020-03-29 DIAGNOSIS — M47894 Other spondylosis, thoracic region: Secondary | ICD-10-CM | POA: Insufficient documentation

## 2020-03-29 MED ORDER — FENTANYL CITRATE (PF) 100 MCG/2ML IJ SOLN
25.0000 ug | INTRAMUSCULAR | Status: DC | PRN
  Administered 2020-03-29: 50 ug via INTRAVENOUS

## 2020-03-29 MED ORDER — MIDAZOLAM HCL 5 MG/5ML IJ SOLN
1.0000 mg | INTRAMUSCULAR | Status: DC | PRN
  Administered 2020-03-29: 2 mg via INTRAVENOUS

## 2020-03-29 MED ORDER — ROPIVACAINE HCL 2 MG/ML IJ SOLN
INTRAMUSCULAR | Status: AC
  Filled 2020-03-29: qty 10

## 2020-03-29 MED ORDER — TRIAMCINOLONE ACETONIDE 40 MG/ML IJ SUSP
40.0000 mg | Freq: Once | INTRAMUSCULAR | Status: AC
  Administered 2020-03-29: 40 mg

## 2020-03-29 MED ORDER — LIDOCAINE HCL 2 % IJ SOLN
20.0000 mL | Freq: Once | INTRAMUSCULAR | Status: AC
  Administered 2020-03-29: 100 mg
  Filled 2020-03-29: qty 20

## 2020-03-29 MED ORDER — TRIAMCINOLONE ACETONIDE 40 MG/ML IJ SUSP
INTRAMUSCULAR | Status: AC
  Filled 2020-03-29: qty 1

## 2020-03-29 MED ORDER — MIDAZOLAM HCL 5 MG/5ML IJ SOLN
INTRAMUSCULAR | Status: AC
  Filled 2020-03-29: qty 5

## 2020-03-29 MED ORDER — ROPIVACAINE HCL 2 MG/ML IJ SOLN
9.0000 mL | Freq: Once | INTRAMUSCULAR | Status: AC
  Administered 2020-03-29: 9 mL via PERINEURAL

## 2020-03-29 MED ORDER — LACTATED RINGERS IV SOLN
1000.0000 mL | Freq: Once | INTRAVENOUS | Status: AC
  Administered 2020-03-29: 1000 mL via INTRAVENOUS

## 2020-03-29 MED ORDER — FENTANYL CITRATE (PF) 100 MCG/2ML IJ SOLN
INTRAMUSCULAR | Status: AC
  Filled 2020-03-29: qty 2

## 2020-03-29 NOTE — Progress Notes (Signed)
Safety precautions to be maintained throughout the outpatient stay will include: orient to surroundings, keep bed in low position, maintain call bell within reach at all times, provide assistance with transfer out of bed and ambulation.  

## 2020-03-29 NOTE — Progress Notes (Signed)
PROVIDER NOTE: Information contained herein reflects review and annotations entered in association with encounter. Interpretation of such information and data should be left to medically-trained personnel. Information provided to patient can be located elsewhere in the medical record under "Patient Instructions". Document created using STT-dictation technology, any transcriptional errors that may result from process are unintentional.    Patient: Angela Espinoza  Service Category: Procedure  Provider: Oswaldo Done, MD  DOB: Mar 05, 1955  DOS: 03/29/2020  Location: [facility]  MRN: [id number]  Setting: Ambulatory - outpatient  Referring Provider: Delano Metz, MD  Type: Established Patient  Specialty: Interventional Pain Management  PCP: Toy Cookey, FNP   Primary Reason for Visit: Interventional Pain Management Treatment. CC: Back Pain (low)  Procedure:          Anesthesia, Analgesia, Anxiolysis:  Type: Lumbar Facet, Medial Branch Block(s)          Primary Purpose: Diagnostic Region: Posterolateral Lumbosacral Spine Level: L2, L3, L4, L5, & S1 Medial Branch Level(s). Injecting these levels blocks the L3-4, L4-5, and L5-S1 lumbar facet joints. Laterality: Left  Type: Moderate (Conscious) Sedation combined with Local Anesthesia Indication(s): Analgesia and Anxiety Route: Intravenous (IV) IV Access: Secured Sedation: Meaningful verbal contact was maintained at all times during the procedure  Local Anesthetic: Lidocaine 1-2%  Position: Prone   Indications: 1. Spondylosis without myelopathy or radiculopathy, lumbosacral region   2. Lumbar facet syndrome (Left)   3. DDD (degenerative disc disease), lumbosacral   4. Chronic low back pain (Primary Area of Pain) (Left) w/o sciatica   5. Spondylosis without myelopathy or radiculopathy, thoracic region   6. Thoracic facet syndrome (Left)    Pain Score: Pre-procedure: 6 /10 Post-procedure: 0-No pain/10    Pre-op H&P Assessment:  Angela Espinoza is a 65 y.o. (year old), female patient, seen today for interventional treatment. She  has a past surgical history that includes Oophorectomy; Exploratory laparotomy; Foot surgery; Colonoscopy; Tonsillectomy; Axillary lymph node biopsy (Right, 08/03/2014); Kyphoplasty (N/A, 12/10/2014); Abdominal hysterectomy (age 73); Breast biopsy (Left, 10/07/2018); and Breast biopsy (Left, 10/21/2019). Angela Espinoza has a current medication list which includes the following prescription(s): acetaminophen, biotin, black elderberry, black pepper-turmeric, calcium carb-cholecalciferol, cholecalciferol, duloxetine, levocetirizine, losartan, magnesium oxide, meloxicam, metoprolol succinate, multivitamin, omega-3 fatty acids, ventolin hfa, and vitamin c, and the following Facility-Administered Medications: fentanyl and midazolam. Her primarily concern today is the Back Pain (low)  Initial Vital Signs:  Pulse/HCG Rate: 64ECG Heart Rate: 62 Temp: 98.1 F (36.7 C) Resp: 16 BP: (!) 114/55 SpO2: 100 %  BMI: Estimated body mass index is 25.61 kg/m as calculated from the following:   Height as of this encounter: 5\' 2"  (1.575 m).   Weight as of this encounter: 140 lb (63.5 kg).  Risk Assessment: Allergies: Reviewed. She is allergic to indapamide.  Allergy Precautions: None required Coagulopathies: Reviewed. None identified.  Blood-thinner therapy: None at this time Active Infection(s): Reviewed. None identified. Angela Espinoza is afebrile  Site Confirmation: Angela Espinoza was asked to confirm the procedure and laterality before marking the site Procedure checklist: Completed Consent: Before the procedure and under the influence of no sedative(s), amnesic(s), or anxiolytics, the patient was informed of the treatment options, risks and possible complications. To fulfill our ethical and legal obligations, as recommended by the American Medical Association's Code of Ethics, I have  informed the patient of my clinical impression; the nature and purpose of the treatment or procedure; the risks, benefits, and possible complications of the intervention; the alternatives, including doing  nothing; the risk(s) and benefit(s) of the alternative treatment(s) or procedure(s); and the risk(s) and benefit(s) of doing nothing. The patient was provided information about the general risks and possible complications associated with the procedure. These may include, but are not limited to: failure to achieve desired goals, infection, bleeding, organ or nerve damage, allergic reactions, paralysis, and death. In addition, the patient was informed of those risks and complications associated to Spine-related procedures, such as failure to decrease pain; infection (i.e.: Meningitis, epidural or intraspinal abscess); bleeding (i.e.: epidural hematoma, subarachnoid hemorrhage, or any other type of intraspinal or peri-dural bleeding); organ or nerve damage (i.e.: Any type of peripheral nerve, nerve root, or spinal cord injury) with subsequent damage to sensory, motor, and/or autonomic systems, resulting in permanent pain, numbness, and/or weakness of one or several areas of the body; allergic reactions; (i.e.: anaphylactic reaction); and/or death. Furthermore, the patient was informed of those risks and complications associated with the medications. These include, but are not limited to: allergic reactions (i.e.: anaphylactic or anaphylactoid reaction(s)); adrenal axis suppression; blood sugar elevation that in diabetics may result in ketoacidosis or comma; water retention that in patients with history of congestive heart failure may result in shortness of breath, pulmonary edema, and decompensation with resultant heart failure; weight gain; swelling or edema; medication-induced neural toxicity; particulate matter embolism and blood vessel occlusion with resultant organ, and/or nervous system infarction; and/or  aseptic necrosis of one or more joints. Finally, the patient was informed that Medicine is not an exact science; therefore, there is also the possibility of unforeseen or unpredictable risks and/or possible complications that may result in a catastrophic outcome. The patient indicated having understood very clearly. We have given the patient no guarantees and we have made no promises. Enough time was given to the patient to ask questions, all of which were answered to the patient's satisfaction. Angela Espinoza has indicated that she wanted to continue with the procedure. Attestation: I, the ordering provider, attest that I have discussed with the patient the benefits, risks, side-effects, alternatives, likelihood of achieving goals, and potential problems during recovery for the procedure that I have provided informed consent. Date  Time: 03/29/2020  9:02 AM  Pre-Procedure Preparation:  Monitoring: As per clinic protocol. Respiration, ETCO2, SpO2, BP, heart rate and rhythm monitor placed and checked for adequate function Safety Precautions: Patient was assessed for positional comfort and pressure points before starting the procedure. Time-out: I initiated and conducted the "Time-out" before starting the procedure, as per protocol. The patient was asked to participate by confirming the accuracy of the "Time Out" information. Verification of the correct person, site, and procedure were performed and confirmed by me, the nursing staff, and the patient. "Time-out" conducted as per Joint Commission's Universal Protocol (UP.01.01.01). Time: 0940  Description of Procedure:          Laterality: Left Levels:  L2, L3, L4, L5, & S1 Medial Branch Level(s) Area Prepped: Posterior Lumbosacral Region DuraPrep (Iodine Povacrylex [0.7% available iodine] and Isopropyl Alcohol, 74% w/w) Safety Precautions: Aspiration looking for blood return was conducted prior to all injections. At no point did we inject any substances,  as a needle was being advanced. Before injecting, the patient was told to immediately notify me if she was experiencing any new onset of "ringing in the ears, or metallic taste in the mouth". No attempts were made at seeking any paresthesias. Safe injection practices and needle disposal techniques used. Medications properly checked for expiration dates. SDV (single dose vial) medications used.  After the completion of the procedure, all disposable equipment used was discarded in the proper designated medical waste containers. Local Anesthesia: Protocol guidelines were followed. The patient was positioned over the fluoroscopy table. The area was prepped in the usual manner. The time-out was completed. The target area was identified using fluoroscopy. A 12-in long, straight, sterile hemostat was used with fluoroscopic guidance to locate the targets for each level blocked. Once located, the skin was marked with an approved surgical skin marker. Once all sites were marked, the skin (epidermis, dermis, and hypodermis), as well as deeper tissues (fat, connective tissue and muscle) were infiltrated with a small amount of a short-acting local anesthetic, loaded on a 10cc syringe with a 25G, 1.5-in  Needle. An appropriate amount of time was allowed for local anesthetics to take effect before proceeding to the next step. Local Anesthetic: Lidocaine 2.0% The unused portion of the local anesthetic was discarded in the proper designated containers. Technical explanation of process:  L2 Medial Branch Nerve Block (MBB): The target area for the L2 medial branch is at the junction of the postero-lateral aspect of the superior articular process and the superior, posterior, and medial edge of the transverse process of L3. Under fluoroscopic guidance, a Quincke needle was inserted until contact was made with os over the superior postero-lateral aspect of the pedicular shadow (target area). After negative aspiration for blood, 0.5 mL  of the nerve block solution was injected without difficulty or complication. The needle was removed intact. L3 Medial Branch Nerve Block (MBB): The target area for the L3 medial branch is at the junction of the postero-lateral aspect of the superior articular process and the superior, posterior, and medial edge of the transverse process of L4. Under fluoroscopic guidance, a Quincke needle was inserted until contact was made with os over the superior postero-lateral aspect of the pedicular shadow (target area). After negative aspiration for blood, 0.5 mL of the nerve block solution was injected without difficulty or complication. The needle was removed intact. L4 Medial Branch Nerve Block (MBB): The target area for the L4 medial branch is at the junction of the postero-lateral aspect of the superior articular process and the superior, posterior, and medial edge of the transverse process of L5. Under fluoroscopic guidance, a Quincke needle was inserted until contact was made with os over the superior postero-lateral aspect of the pedicular shadow (target area). After negative aspiration for blood, 0.5 mL of the nerve block solution was injected without difficulty or complication. The needle was removed intact. L5 Medial Branch Nerve Block (MBB): The target area for the L5 medial branch is at the junction of the postero-lateral aspect of the superior articular process and the superior, posterior, and medial edge of the sacral ala. Under fluoroscopic guidance, a Quincke needle was inserted until contact was made with os over the superior postero-lateral aspect of the pedicular shadow (target area). After negative aspiration for blood, 0.5 mL of the nerve block solution was injected without difficulty or complication. The needle was removed intact. S1 Medial Branch Nerve Block (MBB): The target area for the S1 medial branch is at the posterior and inferior 6 o'clock position of the L5-S1 facet joint. Under  fluoroscopic guidance, the Quincke needle inserted for the L5 MBB was redirected until contact was made with os over the inferior and postero aspect of the sacrum, at the 6 o' clock position under the L5-S1 facet joint (Target area). After negative aspiration for blood, 0.5 mL of the nerve block  solution was injected without difficulty or complication. The needle was removed intact.  Nerve block solution: 0.2% PF-Ropivacaine + Triamcinolone (40 mg/mL) diluted to a final concentration of 4 mg of Triamcinolone/mL of Ropivacaine The unused portion of the solution was discarded in the proper designated containers. Procedural Needles: 22-gauge, 5-inch, Quincke needles used for all levels.  Once the entire procedure was completed, the treated area was cleaned, making sure to leave some of the prepping solution back to take advantage of its long term bactericidal properties.   Illustration of the posterior view of the lumbar spine and the posterior neural structures. Laminae of L2 through S1 are labeled. DPRL5, dorsal primary ramus of L5; DPRS1, dorsal primary ramus of S1; DPR3, dorsal primary ramus of L3; FJ, facet (zygapophyseal) joint L3-L4; I, inferior articular process of L4; LB1, lateral branch of dorsal primary ramus of L1; IAB, inferior articular branches from L3 medial branch (supplies L4-L5 facet joint); IBP, intermediate branch plexus; MB3, medial branch of dorsal primary ramus of L3; NR3, third lumbar nerve root; S, superior articular process of L5; SAB, superior articular branches from L4 (supplies L4-5 facet joint also); TP3, transverse process of L3.  Vitals:   03/29/20 0945 03/29/20 0955 03/29/20 1005 03/29/20 1015  BP: 109/63 (!) 82/54 97/62 100/65  Pulse:      Resp: Temp:  98.1 F (36.7 C)  97.9 F (36.6 C)  SpO2: 95% 97% 97% 98%  Weight:      Height:         Start Time: 0940 hrs. End Time: 0945 hrs.  Imaging Guidance (Spinal):          Type of Imaging Technique:  Fluoroscopy Guidance (Spinal) Indication(s): Assistance in needle guidance and placement for procedures requiring needle placement in or near specific anatomical locations not easily accessible without such assistance. Exposure Time: Please see nurses notes. Contrast: None used. Fluoroscopic Guidance: I was personally present during the use of fluoroscopy. "Tunnel Vision Technique" used to obtain the best possible view of the target area. Parallax error corrected before commencing the procedure. "Direction-depth-direction" technique used to introduce the needle under continuous pulsed fluoroscopy. Once target was reached, antero-posterior, oblique, and lateral fluoroscopic projection used confirm needle placement in all planes. Images permanently stored in EMR. Interpretation: No contrast injected. I personally interpreted the imaging intraoperatively. Adequate needle placement confirmed in multiple planes. Permanent images saved into the patient's record.  Antibiotic Prophylaxis:   Anti-infectives (From admission, onward)   None     Indication(s): None identified  Post-operative Assessment:  Post-procedure Vital Signs:  Pulse/HCG Rate: 6465 Temp: 97.9 F (36.6 C) Resp: 18 BP: 100/65 SpO2: 98 %  EBL: None  Complications: No immediate post-treatment complications observed by team, or reported by patient.  Note: The patient tolerated the entire procedure well. A repeat set of vitals were taken after the procedure and the patient was kept under observation following institutional policy, for this type of procedure. Post-procedural neurological assessment was performed, showing return to baseline, prior to discharge. The patient was provided with post-procedure discharge instructions, including a section on how to identify potential problems. Should any problems arise concerning this procedure, the patient was given instructions to immediately contact us, at any time, without hesitation. In  any case, we plan to contact the patient by telephone for a follow-up status report regarding this interventional procedure.  Comments:  No additional relevant information.  Plan of Care  Orders:  Orders Placed This Encounter  Procedures  .  LUMBAR FACET(MEDIAL BRANCH NERVE BLOCK) MBNB    Scheduling Instructions:     Procedure: Lumbar facet block (AKA.: Lumbosacral medial branch nerve block)     Side: Left-sided     Level: L3-4, L4-5, & L5-S1 Facets (L2, L3, L4, L5, & S1 Medial Branch Nerves)     Sedation: Patient's choice.     Timeframe: Today    Order Specific Question:   Where will this procedure be performed?    Answer:   [facility]  . DG PAIN [facility] C-ARM 1-60 MIN NO REPORT    Intraoperative interpretation by procedural physician at [facility].    Standing Status:   Standing    Number of Occurrences:   1    Order Specific Question:   Reason for exam:    Answer:   Assistance in needle guidance and placement for procedures requiring needle placement in or near specific anatomical locations not easily accessible without such assistance.  . Informed Consent Details: Physician/Practitioner Attestation; Transcribe to consent form and obtain patient signature    Nursing Order: Transcribe to consent form and obtain patient signature. Note: Always confirm laterality of pain with Angela Espinoza, before procedure.    Order Specific Question:   Physician/Practitioner attestation of informed consent for procedure/surgical case    Answer:   I, the physician/practitioner, attest that I have discussed with the patient the benefits, risks, side effects, alternatives, likelihood of achieving goals and potential problems during recovery for the procedure that I have provided informed consent.    Order Specific Question:   Procedure    Answer:   Lumbar Facet Block  under fluoroscopic guidance    Order Specific Question:   Physician/Practitioner performing the procedure    Answer:    Talbot Monarch A. Laban EmperorNaveira MD    Order Specific Question:   Indication/Reason    Answer:   Low Back Pain, with our without leg pain, due to Facet Joint Arthralgia (Joint Pain) Spondylosis (Arthritis of the Spine), without myelopathy or radiculopathy (Nerve Damage).  . Provide equipment / supplies at bedside    "Block Tray" (Disposable  single use) Needle type: SpinalSpinal Amount/quantity: 5 Size: Regular (5-inch) Gauge: 22G    Standing Status:   Standing    Number of Occurrences:   1    Order Specific Question:   Specify    Answer:   Block Tray   Chronic Opioid Analgesic:  No opioid analgesics from our practice. MME/day:0 mg/day   Medications ordered for procedure: Meds ordered this encounter  Medications  . lidocaine (XYLOCAINE) 2 % (with pres) injection 400 mg  . lactated ringers infusion 1,000 mL  . midazolam (VERSED) 5 MG/5ML injection 1-2 mg    Make sure Flumazenil is available in the pyxis when using this medication. If oversedation occurs, administer 0.2 mg IV over 15 sec. If after 45 sec no response, administer 0.2 mg again over 1 min; may repeat at 1 min intervals; not to exceed 4 doses (1 mg)  . fentaNYL (SUBLIMAZE) injection 25-50 mcg    Make sure Narcan is available in the pyxis when using this medication. In the event of respiratory depression (RR< 8/min): Titrate NARCAN (naloxone) in increments of 0.1 to 0.2 mg IV at 2-3 minute intervals, until desired degree of reversal.  . ropivacaine (PF) 2 mg/mL (0.2%) (NAROPIN) injection 9 mL  . triamcinolone acetonide (KENALOG-40) injection 40 mg   Medications administered: We administered lidocaine, lactated ringers, midazolam, fentaNYL, ropivacaine (PF) 2 mg/mL (0.2%), and triamcinolone acetonide.  See the medical record for exact dosing, route, and time of administration.  Follow-up plan:   Return in about 2 weeks (around 04/12/2020) for (VIrtual), (PP) Follow-up.       Interventional Therapies  Risk  Complexity  Considerations:   WNL   Planned  Pending:   Pending further evaluation   Under consideration:   Diagnostic left L1-2LESI #1   Completed:   Diagnostic left thoracolumbar facet MBB (T12, L1, L2, & L3) block x2  Therapeutic left thoracolumbar (T12, L1, L2, & L3) MB RFA x1 (09/30/2018) (100/100/50) Diagnostic left lumbar facet MBB x1 (Diagnostic #1: 100/100/100x 1 wk)  Therapeutic left lumbar (L4, L5, S1) MB RFA x1 (07/28/2019) (100/100/100/100) Note: The patient has indicated that she has had prior lumbar facet radiofrequency ablations at [facility].   Palliative options:   Palliative left thoracolumbar (T12, L1, L2, & L3) MB RFA #2  Palliative left lumbar (L4, L5, S1) MB RFA #2     Recent Visits Date Type Provider Dept  01/06/20 Office Visit Delano Metz, MD [facility]  Showing recent visits within past 90 days and meeting all other requirements Today's Visits Date Type Provider Dept  03/29/20 Procedure visit Delano Metz, MD [facility]  Showing today's visits and meeting all other requirements Future Appointments Date Type Provider Dept  04/12/20 Appointment Delano Metz, MD [facility]  Showing future appointments within next 90 days and meeting all other requirements  Disposition: Discharge home  Discharge (Date  Time): 03/29/2020; 1021 hrs.   Primary Care Physician: Toy Cookey, FNP Location: [facility] Note by: Oswaldo Done, MD Date: 03/29/2020; Time: 11:33 AM  Disclaimer:  Medicine is not an exact science. The only guarantee in medicine is that nothing is guaranteed. It is important to note that the decision to proceed with this intervention was based on the information collected from the patient. The Data and conclusions were drawn from the patient's questionnaire, the interview, and the physical examination. Because the information was provided in large part by the patient, it cannot be  guaranteed that it has not been purposely or unconsciously manipulated. Every effort has been made to obtain as much relevant data as possible for this evaluation. It is important to note that the conclusions that lead to this procedure are derived in large part from the available data. Always take into account that the treatment will also be dependent on availability of resources and existing treatment guidelines, considered by other Pain Management Practitioners as being common knowledge and practice, at the time of the intervention. For Medico-Legal purposes, it is also important to point out that variation in procedural techniques and pharmacological choices are the acceptable norm. The indications, contraindications, technique, and results of the above procedure should only be interpreted and judged by a Board-Certified Interventional Pain Specialist with extensive familiarity and expertise in the same exact procedure and technique.

## 2020-03-29 NOTE — Patient Instructions (Signed)

## 2020-03-30 ENCOUNTER — Telehealth: Payer: Self-pay

## 2020-03-30 NOTE — Telephone Encounter (Signed)
Post procedure follow up call.  LM 

## 2020-04-06 DIAGNOSIS — Z0000 Encounter for general adult medical examination without abnormal findings: Secondary | ICD-10-CM | POA: Diagnosis not present

## 2020-04-07 ENCOUNTER — Other Ambulatory Visit: Payer: Self-pay

## 2020-04-07 ENCOUNTER — Ambulatory Visit
Admission: RE | Admit: 2020-04-07 | Discharge: 2020-04-07 | Disposition: A | Discharge status: Home / Self Care | Payer: Medicare HMO | Source: Ambulatory Visit | Attending: Surgery | Admitting: Surgery

## 2020-04-07 DIAGNOSIS — R928 Other abnormal and inconclusive findings on diagnostic imaging of breast: Secondary | ICD-10-CM | POA: Insufficient documentation

## 2020-04-07 DIAGNOSIS — R922 Inconclusive mammogram: Secondary | ICD-10-CM | POA: Diagnosis not present

## 2020-04-09 NOTE — Progress Notes (Signed)
Patient: Angela Espinoza  Service Category: E/M  Provider: Gaspar Cola, MD  DOB: 03/20/1955  DOS: 04/12/2020  Location: Office  MRN: [id number]  Setting: Ambulatory outpatient  Referring Provider: Gennette Pac, FNP  Type: Established Patient  Specialty: Interventional Pain Management  PCP: Angela Pac, FNP  Location: Remote location  Delivery: TeleHealth     Virtual Encounter - Pain Management PROVIDER NOTE: Information contained herein reflects review and annotations entered in association with encounter. Interpretation of such information and data should be left to medically-trained personnel. Information provided to patient can be located elsewhere in the medical record under "Patient Instructions". Document created using STT-dictation technology, any transcriptional errors that may result from process are unintentional.    Contact & Pharmacy Preferred: [place]: [phone] (home) Mobile: [phone] (mobile) E-mail: [email or web address]  [id number]Phone: [phone]: [phone]  Pre-screening  [name] offered "in-person" vs "virtual" encounter. She indicated preferring virtual for this encounter.   Reason COVID-19*  Social distancing based on CDC and AMA recommendations.   I contacted BGelena KlosinskiCulberson on 04/12/2020 via telephone.      I clearly identified myself as FGaspar Cola MD. I verified that I was speaking with the correct person using two identifiers (Name: Angela Espinoza and date of birth: [birth year].  Consent I sought verbal advanced consent from BMadaline Savagefor virtual visit interactions. I informed Angela Espinoza of possible security and privacy concerns, risks, and limitations associated with providing "not-in-person" medical evaluation and management services. I also informed Angela Espinoza of the  availability of "in-person" appointments. Finally, I informed her that there would be a charge for the virtual visit and that she could be  personally, fully or partially, financially responsible for it. Ms. CStachnikexpressed understanding and agreed to proceed.   Historic Elements   Ms. BElanore Talcottis a 65y.o. year old, female patient evaluated today after our last contact on 03/29/2020. Ms. CSalminen has a past medical history of Anxiety, Asthma, Depression, Hyperlipidemia, Hypertension, and IBS (irritable bowel syndrome). She also  has a past surgical history that includes Oophorectomy; Exploratory laparotomy; Foot surgery; Colonoscopy; Tonsillectomy; Axillary lymph node biopsy (Right, 08/03/2014); Kyphoplasty (N/A, 12/10/2014); Abdominal hysterectomy (age 65; Breast biopsy (Left, 10/07/2018); and Breast biopsy (Left, 10/21/2019). Ms. CPharrishas a current medication list which includes the following prescription(s): acetaminophen, biotin, black elderberry, black pepper-turmeric, calcium carb-cholecalciferol, cholecalciferol, duloxetine, levocetirizine, losartan, magnesium oxide, meloxicam, metoprolol succinate, multivitamin, omega-3 fatty acids, ventolin hfa, and vitamin c. She  reports that she is a non-smoker but has been exposed to tobacco smoke. She has never used smokeless tobacco. She reports current alcohol use. She reports that she does not use drugs. Ms. CTroutmanis allergic to indapamide.   HPI  Today, she is being contacted for a post-procedure assessment.  The patient is being contacted today for follow-up evaluation after a diagnostic left lumbar facet block #2 under fluoroscopic guidance and IV sedation.  She indicated that for the duration of the local anesthetic, she had 100% relief of the pain.  However, after that it began to wear off but primarily the area that seems to be worse is the upper thoracolumbar region.  This confirms my suspicions that it is the area between  the T12 and the L3 that has worn off.  The last time we did that thoracolumbar facet region (T12, L1, L2, & L3 MB levels) was  on 09/30/2018 (560 days, or 18 months ago) and it did provide the patient's low back symptoms with 100% relief of the pain until recently when it started to come back.  For this reason, I believe it to be medically necessary for [place] to repeat that radiofrequency ablation.  This plan was shared with the patient who understood and accepted.  Post-Procedure Evaluation  Procedure (03/29/2020): Diagnostic left lumbar facet block #2 under fluoroscopic guidance and IV sedation Pre-procedure pain level: 6/10 Post-procedure: 0/10 (100% relief)  Sedation: Sedation provided.  Effectiveness during initial hour after procedure(Ultra-Short Term Relief): 100 %.  Local anesthetic used: Long-acting (4-6 hours) Effectiveness: Defined as any analgesic benefit obtained secondary to the administration of local anesthetics. This carries significant diagnostic value as to the etiological location, or anatomical origin, of the pain. Duration of benefit is expected to coincide with the duration of the local anesthetic used.  Effectiveness during initial 4-6 hours after procedure(Short-Term Relief): 100 %.  Long-term benefit: Defined as any relief past the pharmacologic duration of the local anesthetics.  Effectiveness past the initial 6 hours after procedure(Long-Term Relief): 60 %.  Current benefits: Defined as benefit that persist at this time.   Analgesia:  The patient refers having attained an ongoing 60% relief of her lower back pain. Function: Angela Espinoza reports improvement in function ROM: Angela Espinoza reports improvement in ROM  Pharmacotherapy Assessment  Analgesic: No opioid analgesics from our practice. MME/day:0 mg/day   Monitoring:  PMP: PDMP reviewed during this encounter.       Pharmacotherapy: No side-effects or adverse reactions reported. Compliance: No problems  identified. Effectiveness: Clinically acceptable. Plan: Refer to "POC".  UDS:  Summary  Date Value Ref Range Status  12/10/2017 FINAL  Final    Comment:    ==================================================================== TOXASSURE COMP DRUG ANALYSIS,UR ==================================================================== Test                             Result       Flag       Units Drug Present and Declared for Prescription Verification   Cyclobenzaprine                PRESENT      EXPECTED   Amitriptyline                  PRESENT      EXPECTED   Metoprolol                     PRESENT      EXPECTED Drug Absent but Declared for Prescription Verification   Desmethylcyclobenzaprine       Not Detected UNEXPECTED   Duloxetine                     Not Detected UNEXPECTED ==================================================================== Test                      Result    Flag   Units      Ref Range   Creatinine              28               mg/dL      >=20 ==================================================================== Declared Medications:  The flagging and interpretation on this report are based on the  following declared medications.  Unexpected results may arise from  inaccuracies in the declared medications.  **Note: The testing  scope of this panel includes these medications:  Amitriptyline  Cyclobenzaprine  Duloxetine  Metoprolol  **Note: The testing scope of this panel does not include following  reported medications:  Cholecalciferol  Hydrochlorothiazide  Levocetirizine  Losartan (Losartan Potassium)  Magnesium  Meloxicam  Multivitamin  Supplement  Supplement (Omega-3)  Vitamin B (Biotin)  Vitamin C ==================================================================== For clinical consultation, please call [phone]. ====================================================================     Laboratory Chemistry Profile   Renal Lab Results   Component Value Date   BUN 10 01/01/2018   CREATININE 0.88 01/01/2018   BCR 11 (L) 01/01/2018   GFRAA 81 01/01/2018   GFRNONAA 70 01/01/2018     Hepatic Lab Results  Component Value Date   AST 26 01/01/2018   ALT 42 08/10/2011   ALBUMIN 4.6 01/01/2018   ALKPHOS 73 01/01/2018     Electrolytes Lab Results  Component Value Date   NA 136 01/01/2018   K 4.5 01/01/2018   CL 98 01/01/2018   CALCIUM 9.9 01/01/2018   MG 2.3 12/10/2017   PHOS 1.6 (L) 08/10/2011     Bone Lab Results  Component Value Date   25OHVITD1 40 01/01/2018   25OHVITD2 <1.0 01/01/2018   25OHVITD3 40 01/01/2018     Inflammation (CRP: Acute Phase) (ESR: Chronic Phase) Lab Results  Component Value Date   CRP 1 12/10/2017   ESRSEDRATE 3 12/10/2017       Note: Above Lab results reviewed.  Imaging  MM DIAG BREAST TOMO UNI LEFT CLINICAL DATA:  Short-term follow-up. Patient underwent a stereotactic core needle biopsy of an area of asymmetry in the left breast on 10/21/2019, which revealed benign mammary parenchyma with dense stromal fibrosis, fibrosis consistent with the prior biopsy site. Negative for atypia or malignancy. The patient also had a previous stereotactic core needle biopsy on 10/07/2018, which revealed benign breast tissue with areas of adipose tissue, without malignancy or atypia. She underwent follow-up diagnostic imaging on 05/06/2019 and on 09/16/2019. On 09/16/2019 the conclusion was that persistent asymmetry and possible distortion warranted surgical excision. This was not performed. The patient subsequently underwent the most recent stereotactic core needle biopsy as detailed above.  EXAM: DIGITAL DIAGNOSTIC UNILATERAL LEFT MAMMOGRAM WITH TOMOSYNTHESIS AND CAD  TECHNIQUE: Left digital diagnostic mammography and breast tomosynthesis was performed. The images were evaluated with computer-aided detection.  COMPARISON:  Previous exam(s).  ACR Breast Density Category c: The  breast tissue is heterogeneously dense, which may obscure small masses.  FINDINGS: A ribbon shaped biopsy clip lies in the previous location of the coil shaped biopsy clip, the coil clip being removed during the most recent stereotactic core needle biopsy. There is linearly arranged opacity on the MLO view associated with the biopsy clip consistent with post biopsy change. There is no mass and there is no convincing architectural distortion.  No suspicious calcifications.  IMPRESSION: 1. No evidence of breast malignancy. 2. Benign post biopsy changes in the upper outer left breast associated with a ribbon shaped biopsy clip.  RECOMMENDATION: Return to routine annual screening mammography. Last bilateral mammography performed on 09/16/2019.  I have discussed the findings and recommendations with the patient. If applicable, a reminder letter will be sent to the patient regarding the next appointment.  BI-RADS CATEGORY  2: Benign.  Electronically Signed   By: Lajean Manes M.D.   On: 04/07/2020 14:30  Assessment  The primary encounter diagnosis was Chronic pain syndrome. Diagnoses of Lumbar facet syndrome (Left), Chronic low back pain (Primary Area of Pain) (Left) w/o sciatica,  Thoracic facet syndrome (Left), and Traumatic compression fracture of L1 lumbar vertebra, sequela (2016) were also pertinent to this visit.  Plan of Care  Problem-specific:  No problem-specific Assessment & Plan notes found for this encounter.  Ms. Blessyn [place] has a current medication list which includes the following long-term medication(s): calcium carb-cholecalciferol, levocetirizine, losartan, metoprolol succinate, and ventolin hfa.  Pharmacotherapy (Medications Ordered): No orders of the defined types were placed in this encounter.  Orders:  Orders Placed This Encounter  Procedures  . Radiofrequency,Lumbar    Please go ahead and get preapproval for a left Therapeutic thoracolumbar  facet (T12, L1, L2, & L3 MB) RFA #2.  The last time we did that level was on 09/30/2018 (560 days, or 18 months ago) and it did provide the patient with excellent (100%) relief of the pain until recently when it started to come back.    Standing Status:   Future    Standing Expiration Date:   04/12/2021    Scheduling Instructions:     Side(s): Left-sided     Level: T12, L1, L2, & L3 Medial Branch Nerves     Sedation: With Sedation.     Scheduling Timeframe: As soon as pre-approved    Order Specific Question:   Where will this procedure be performed?    Answer:   [facility]   Follow-up plan:   Return for RFA (57mn): (L) thoracolumbar FCT (T12, L1, L2, & L3 MB) RFA #2.      Interventional Therapies  Risk  Complexity Considerations:   WNL   Planned  Pending:   Therapeutic left thoracolumbar (T12, L1, L2, & L3) MB RFA #2    Under consideration:   Diagnostic left L1-2LESI #1   Completed:   Diagnostic left thoracolumbar facet MBB (T12, L1, L2, & L3) block x2  Therapeutic left thoracolumbar (T12, L1, L2, & L3) MB RFA x1 (09/30/2018) (100/100/50) Diagnostic left lumbar facet MBB x2 (03/29/2020)  Therapeutic left lumbar (L4, L5, S1) MB RFA x1 (07/28/2019) (100/100/100/100) Note: The patient has indicated [facility]   Palliative options:   Palliative left thoracolumbar (T12, L1, L2, & L3) MB RFA #2  Palliative left lumbar (L4, L5, S1) MB RFA #2     Recent Visits Date Type Provider Dept  03/29/20 Procedure visit NMilinda Pointer MD [facility]  Showing recent visits within past 90 days and meeting all other requirements Today's Visits Date Type Provider Dept  04/12/20 Telemedicine NMilinda Pointer MD [facility]  Showing today's visits and meeting all other requirements Future Appointments No visits were found meeting these conditions. Showing future appointments within next 90 days and  meeting all other requirements  I discussed the assessment and treatment plan with the patient. The patient was provided an opportunity to ask questions and all were answered. The patient agreed with the plan and demonstrated an understanding of the instructions.  Patient advised to call back or seek an in-person evaluation if the symptoms or condition worsens.  Duration of encounter: 35 minutes.  Note by: FGaspar Cola MD Date: 04/12/2020; Time: 4:55 PM

## 2020-04-11 ENCOUNTER — Encounter: Payer: Self-pay | Admitting: Pain Medicine

## 2020-04-11 NOTE — Telephone Encounter (Signed)
LM for patient to call office for pre virtual appointment questions.  

## 2020-04-12 ENCOUNTER — Ambulatory Visit: Payer: Medicare HMO | Attending: Pain Medicine | Admitting: Pain Medicine

## 2020-04-12 ENCOUNTER — Other Ambulatory Visit: Payer: Self-pay

## 2020-04-12 DIAGNOSIS — S32010S Wedge compression fracture of first lumbar vertebra, sequela: Secondary | ICD-10-CM

## 2020-04-12 DIAGNOSIS — M545 Low back pain, unspecified: Secondary | ICD-10-CM | POA: Diagnosis not present

## 2020-04-12 DIAGNOSIS — G894 Chronic pain syndrome: Secondary | ICD-10-CM

## 2020-04-12 DIAGNOSIS — G8929 Other chronic pain: Secondary | ICD-10-CM | POA: Diagnosis not present

## 2020-04-12 DIAGNOSIS — M47894 Other spondylosis, thoracic region: Secondary | ICD-10-CM

## 2020-04-12 DIAGNOSIS — M47816 Spondylosis without myelopathy or radiculopathy, lumbar region: Secondary | ICD-10-CM

## 2020-04-12 NOTE — Patient Instructions (Signed)

## 2020-04-14 ENCOUNTER — Encounter: Payer: Self-pay | Admitting: Surgery

## 2020-04-14 ENCOUNTER — Ambulatory Visit (INDEPENDENT_AMBULATORY_CARE_PROVIDER_SITE_OTHER): Payer: Medicare HMO | Admitting: Surgery

## 2020-04-14 ENCOUNTER — Other Ambulatory Visit: Payer: Self-pay

## 2020-04-14 VITALS — BP 119/77 | HR 68 | Temp 98.2°F | Ht 62.0 in | Wt 137.8 lb

## 2020-04-14 DIAGNOSIS — R928 Other abnormal and inconclusive findings on diagnostic imaging of breast: Secondary | ICD-10-CM

## 2020-04-14 NOTE — Patient Instructions (Signed)
We will contact you in July to schedule your mammogram and breast exam for August 2022. Please call our office if you have any questions or concerns.

## 2020-04-14 NOTE — Progress Notes (Signed)
Surgical Clinic Progress/Follow-up Note   HPI:  65 y.o. Female presents to clinic for left breast mammographic abnormality follow-up.  She opted to pursue the last imaging irregularity with repeat core biopsy.  She has no current breast issues, denies any tenderness, new masses or skin changes.  10/21/19 DIAGNOSIS:  A. BREAST, LEFT UPPER OUTER QUADRANT; STEREOTACTIC CORE NEEDLE BIOPSY:  - BENIGN MAMMARY PARENCHYMA WITH DENSE STROMAL FIBROSIS.  - CHANGES COMPATIBLE WITH PRIOR BIOPSY SITE.  - NEGATIVE FOR ATYPICAL PROLIFERATIVE BREAST DISEASE.   CLINICAL DATA:  Short-term follow-up. Patient underwent a stereotactic core needle biopsy of an area of asymmetry in the left breast on 10/21/2019, which revealed benign mammary parenchyma with dense stromal fibrosis, fibrosis consistent with the prior biopsy site. Negative for atypia or malignancy. The patient also had a previous stereotactic core needle biopsy on 10/07/2018, which revealed benign breast tissue with areas of adipose tissue, without malignancy or atypia. She underwent follow-up diagnostic imaging on 05/06/2019 and on 09/16/2019. On 09/16/2019 the conclusion was that persistent asymmetry and possible distortion warranted surgical excision. This was not performed. The patient subsequently underwent the most recent stereotactic core needle biopsy as detailed above.  EXAM: DIGITAL DIAGNOSTIC UNILATERAL LEFT MAMMOGRAM WITH TOMOSYNTHESIS AND CAD  TECHNIQUE: Left digital diagnostic mammography and breast tomosynthesis was performed. The images were evaluated with computer-aided detection.  COMPARISON:  Previous exam(s).  ACR Breast Density Category c: The breast tissue is heterogeneously dense, which may obscure small masses.  FINDINGS: A ribbon shaped biopsy clip lies in the previous location of the coil shaped biopsy clip, the coil clip being removed during the most recent stereotactic core needle biopsy. There is  linearly arranged opacity on the MLO view associated with the biopsy clip consistent with post biopsy change. There is no mass and there is no convincing architectural distortion.  No suspicious calcifications.  IMPRESSION: 1. No evidence of breast malignancy. 2. Benign post biopsy changes in the upper outer left breast associated with a ribbon shaped biopsy clip.  RECOMMENDATION: Return to routine annual screening mammography. Last bilateral mammography performed on 09/16/2019.  I have discussed the findings and recommendations with the patient. If applicable, a reminder letter will be sent to the patient regarding the next appointment.  BI-RADS CATEGORY  2: Benign.   Electronically Signed   By: Amie Portland M.D.   On: 04/07/2020 14:30   Review of Systems:  Constitutional: denies fever/chills  ENT: denies sore throat, hearing problems  Respiratory: denies shortness of breath, wheezing  Cardiovascular: denies chest pain, palpitations  Gastrointestinal: denies abdominal pain, N/V, or diarrhea Skin: Denies any other rashes or skin discolorations Vital Signs:  There were no vitals taken for this visit.   Physical Exam:  Constitutional:  -- Normal body habitus  -- Awake, alert, and oriented x3  Pulmonary:  -- No crackles -- Equal breath sounds bilaterally -- Breathing non-labored at rest Cardiovascular:  -- S1, S2 present  -- No pericardial rubs  Gastrointestinal:  -- Soft and non-distended, non-tender GU  --breast exam unremarkable for dominant nor suspicious nodularity.  No evidence of skin changes or remarkable asymmetry. Musculoskeletal / Integumentary:  -- Wounds or skin discoloration: None appreciated -- Extremities: B/L UE and LE FROM, hands and feet warm, no edema    Imaging: As noted above.   Assessment:  65 y.o. yo Female with a problem list including...  Patient Active Problem List   Diagnosis Date Noted  . Abnormal mammogram of left  breast 09/24/2019  . Acid  reflux 12/03/2018  . Neurogenic pain 11/03/2018  . Thoracic facet syndrome (Left) 09/30/2018  . Spondylosis without myelopathy or radiculopathy, lumbosacral region 09/30/2018  . Spondylosis without myelopathy or radiculopathy, thoracic region 09/30/2018  . DDD (degenerative disc disease), thoracolumbar 09/30/2018  . Acute postoperative pain 09/30/2018  . DDD (degenerative disc disease), lumbosacral 07/24/2018  . History of L1 kyphoplasty 01/01/2018  . Traumatic compression fracture of L1 lumbar vertebra, sequela (2016) 01/01/2018  . Spondylosis without myelopathy or radiculopathy, lumbar region 01/01/2018  . Lumbar facet syndrome (Left) 01/01/2018  . Allergy 12/10/2017  . Anxiety 12/10/2017  . Asthma without status asthmaticus 12/10/2017  . Depression 12/10/2017  . Hypertension 12/10/2017  . Chronic low back pain (Primary Area of Pain) (Left) w/o sciatica 12/10/2017  . Chronic pain syndrome 12/10/2017  . Pharmacologic therapy 12/10/2017  . Disorder of skeletal system 12/10/2017  . Problems influencing health status 12/10/2017  . Postmenopause 06/02/2014    presents to clinic for follow-up evaluation of abnormal left breast mammogram, progressing well.  Plan:              - return to clinic after next screening mammography in August or as needed, instructed to call office if any questions or concerns  All of the above recommendations were discussed with the patient, and all of patient's questions were answered to her expressed satisfaction.  Campbell Lerner, MD, FACS [place]: [facility] General [facility] - Partnering for exceptional care. Office: [phone]

## 2020-04-21 NOTE — Progress Notes (Signed)
PROVIDER NOTE: Information contained herein reflects review and annotations entered in association with encounter. Interpretation of such information and data should be left to medically-trained personnel. Information provided to patient can be located elsewhere in the medical record under "Patient Instructions". Document created using STT-dictation technology, any transcriptional errors that may result from process are unintentional.    Patient: Angela Espinoza  Service Category: Procedure  Provider: Oswaldo Done, MD  DOB: April 30, 1955  DOS: 04/26/2020  Location: [facility]  MRN: [id number]  Setting: Ambulatory - outpatient  Referring Provider: Toy Cookey, FNP  Type: Established Patient  Specialty: Interventional Pain Management  PCP: Toy Cookey, FNP   Primary Reason for Visit: Interventional Pain Management Treatment. CC: Back Pain  Procedure:          Anesthesia, Analgesia, Anxiolysis:  Type: Thermal Lumbar Facet, Medial Branch Radiofrequency Ablation/Neurotomy  #2  Primary Purpose: Therapeutic Region: Posterolateral Lumbosacral Spine Level: T12, L1, L2, and L3 Medial Branch Level(s). These levels will denervate the T12-L1, L1-2, and L2-3 thoraco-lumbar facet joints. Laterality: Left  Type: Moderate (Conscious) Sedation combined with Local Anesthesia Indication(s): Analgesia and Anxiety Route: Intravenous (IV) IV Access: Secured Sedation: Meaningful verbal contact was maintained at all times during the procedure  Local Anesthetic: Lidocaine 1-2%  Position: Prone   Indications: 1. Spondylosis without myelopathy or radiculopathy, thoracic region   2. Thoracic facet syndrome (Left)   3. Spondylosis without myelopathy or radiculopathy, lumbar region   4. Lumbar facet syndrome (Left)   5. DDD (degenerative disc disease), thoracolumbar   6. Chronic low back pain (1ry area of Pain) (Left) w/o sciatica   7. Traumatic compression fracture of L1 lumbar  vertebra, sequela (2016)   8. History of L1 kyphoplasty    Angela Espinoza has been dealing with the above chronic pain for longer than three months and has either failed to respond, was unable to tolerate, or simply did not get enough benefit from other more conservative therapies including, but not limited to: 1. Over-the-counter medications 2. Anti-inflammatory medications 3. Muscle relaxants 4. Membrane stabilizers 5. Opioids 6. Physical therapy and/or chiropractic manipulation 7. Modalities (Heat, ice, etc.) 8. Invasive techniques such as nerve blocks. Angela Espinoza has attained more than 50% relief of the pain from a series of diagnostic injections conducted in separate occasions.  Pain Score: Pre-procedure: 3 /10 Post-procedure: 0-No pain/10  Pre-op H&P Assessment:  Angela Espinoza is a 65 y.o. (year old), female patient, seen today for interventional treatment. She  has a past surgical history that includes Oophorectomy; Exploratory laparotomy; Foot surgery; Colonoscopy; Tonsillectomy; Axillary lymph node biopsy (Right, 08/03/2014); Kyphoplasty (N/A, 12/10/2014); Abdominal hysterectomy (age 65); Breast biopsy (Left, 10/07/2018); and Breast biopsy (Left, 10/21/2019). Angela Espinoza has a current medication list which includes the following prescription(s): acetaminophen, biotin, black elderberry, black pepper-turmeric, calcium carb-cholecalciferol, cholecalciferol, duloxetine, hydrocodone-acetaminophen, [START ON 05/02/2020] hydrocodone-acetaminophen, levocetirizine, losartan, magnesium oxide, meloxicam, metoprolol succinate, multivitamin, omega-3 fatty acids, ventolin hfa, and vitamin c, and the following Facility-Administered Medications: fentanyl and midazolam. Her primarily concern today is the Back Pain  Initial Vital Signs:  Pulse/HCG Rate: 74ECG Heart Rate: 73 Temp: (!) 96.8 F (36 C) Resp: 11 BP: (!) 113/52 SpO2: 97 %  BMI: Estimated body mass index is 24.69 kg/m as calculated  from the following:   Height as of this encounter:  (1.575 m).   Weight as of this encounter: 135 lb (61.2 kg).  Risk Assessment: Allergies: Reviewed. She is allergic to indapamide.  Allergy Precautions: None required Coagulopathies:  Reviewed. None identified.  Blood-thinner therapy: None at this time Active Infection(s): Reviewed. None identified. Angela Espinoza is afebrile  Site Confirmation: Angela Espinoza was asked to confirm the procedure and laterality before marking the site Procedure checklist: Completed Consent: Before the procedure and under the influence of no sedative(s), amnesic(s), or anxiolytics, the patient was informed of the treatment options, risks and possible complications. To fulfill our ethical and legal obligations, as recommended by the American Medical Association's Code of Ethics, I have informed the patient of my clinical impression; the nature and purpose of the treatment or procedure; the risks, benefits, and possible complications of the intervention; the alternatives, including doing nothing; the risk(s) and benefit(s) of the alternative treatment(s) or procedure(s); and the risk(s) and benefit(s) of doing nothing. The patient was provided information about the general risks and possible complications associated with the procedure. These may include, but are not limited to: failure to achieve desired goals, infection, bleeding, organ or nerve damage, allergic reactions, paralysis, and death. In addition, the patient was informed of those risks and complications associated to Spine-related procedures, such as failure to decrease pain; infection (i.e.: Meningitis, epidural or intraspinal abscess); bleeding (i.e.: epidural hematoma, subarachnoid hemorrhage, or any other type of intraspinal or peri-dural bleeding); organ or nerve damage (i.e.: Any type of peripheral nerve, nerve root, or spinal cord injury) with subsequent damage to sensory, motor, and/or autonomic  systems, resulting in permanent pain, numbness, and/or weakness of one or several areas of the body; allergic reactions; (i.e.: anaphylactic reaction); and/or death. Furthermore, the patient was informed of those risks and complications associated with the medications. These include, but are not limited to: allergic reactions (i.e.: anaphylactic or anaphylactoid reaction(s)); adrenal axis suppression; blood sugar elevation that in diabetics may result in ketoacidosis or comma; water retention that in patients with history of congestive heart failure may result in shortness of breath, pulmonary edema, and decompensation with resultant heart failure; weight gain; swelling or edema; medication-induced neural toxicity; particulate matter embolism and blood vessel occlusion with resultant organ, and/or nervous system infarction; and/or aseptic necrosis of one or more joints. Finally, the patient was informed that Medicine is not an exact science; therefore, there is also the possibility of unforeseen or unpredictable risks and/or possible complications that may result in a catastrophic outcome. The patient indicated having understood very clearly. We have given the patient no guarantees and we have made no promises. Enough time was given to the patient to ask questions, all of which were answered to the patient's satisfaction. Angela Espinoza has indicated that she wanted to continue with the procedure. Attestation: I, the ordering provider, attest that I have discussed with the patient the benefits, risks, side-effects, alternatives, likelihood of achieving goals, and potential problems during recovery for the procedure that I have provided informed consent. Date  Time: 04/26/2020 10:07 AM  Pre-Procedure Preparation:  Monitoring: As per clinic protocol. Respiration, ETCO2, SpO2, BP, heart rate and rhythm monitor placed and checked for adequate function Safety Precautions: Patient was assessed for positional  comfort and pressure points before starting the procedure. Time-out: I initiated and conducted the "Time-out" before starting the procedure, as per protocol. The patient was asked to participate by confirming the accuracy of the "Time Out" information. Verification of the correct person, site, and procedure were performed and confirmed by me, the nursing staff, and the patient. "Time-out" conducted as per Joint Commission's Universal Protocol (UP.01.01.01). Time: 1035  Description of Procedure:  Laterality: Left Levels:  T12, L1, L2, and L3 Medial Branch Level(s). These levels will denervate the T12-L1, L1-2, and L2-3 thoraco-lumbar facet joints.. Area Prepped: Lumbosacral DuraPrep (Iodine Povacrylex [0.7% available iodine] and Isopropyl Alcohol, 74% w/w) Safety Precautions: Aspiration looking for blood return was conducted prior to all injections. At no point did we inject any substances, as a needle was being advanced. Before injecting, the patient was told to immediately notify me if she was experiencing any new onset of "ringing in the ears, or metallic taste in the mouth". No attempts were made at seeking any paresthesias. Safe injection practices and needle disposal techniques used. Medications properly checked for expiration dates. SDV (single dose vial) medications used. After the completion of the procedure, all disposable equipment used was discarded in the proper designated medical waste containers. Local Anesthesia: Protocol guidelines were followed. The patient was positioned over the fluoroscopy table. The area was prepped in the usual manner. The time-out was completed. The target area was identified using fluoroscopy. A 12-in long, straight, sterile hemostat was used with fluoroscopic guidance to locate the targets for each level blocked. Once located, the skin was marked with an approved surgical skin marker. Once all sites were marked, the skin (epidermis, dermis, and hypodermis),  as well as deeper tissues (fat, connective tissue and muscle) were infiltrated with a small amount of a short-acting local anesthetic, loaded on a 10cc syringe with a 25G, 1.5-in  Needle. An appropriate amount of time was allowed for local anesthetics to take effect before proceeding to the next step. Local Anesthetic: Lidocaine 2.0% The unused portion of the local anesthetic was discarded in the proper designated containers. Technical explanation of process:  Radiofrequency Ablation (RFA) T12 Medial Branch Nerve RFA: The target area for the T12 medial branch is at the junction of the postero-lateral aspect of the superior articular process and the superior, posterior, and medial edge of the transverse process of L1. Under fluoroscopic guidance, a Radiofrequency needle was inserted until contact was made with os over the superior postero-lateral aspect of the pedicular shadow (target area). Sensory and motor testing was conducted to properly adjust the position of the needle. Once satisfactory placement of the needle was achieved, the numbing solution was slowly injected after negative aspiration for blood. 2.0 mL of the nerve block solution was injected without difficulty or complication. After waiting for at least 3 minutes, the ablation was performed. Once completed, the needle was removed intact. L1 Medial Branch Nerve RFA: The target area for the L1 medial branch is at the junction of the postero-lateral aspect of the superior articular process and the superior, posterior, and medial edge of the transverse process of L2. Under fluoroscopic guidance, a Radiofrequency needle was inserted until contact was made with os over the superior postero-lateral aspect of the pedicular shadow (target area). Sensory and motor testing was conducted to properly adjust the position of the needle. Once satisfactory placement of the needle was achieved, the numbing solution was slowly injected after negative aspiration for  blood. 2.0 mL of the nerve block solution was injected without difficulty or complication. After waiting for at least 3 minutes, the ablation was performed. Once completed, the needle was removed intact. L2 Medial Branch Nerve RFA: The target area for the L2 medial branch is at the junction of the postero-lateral aspect of the superior articular process and the superior, posterior, and medial edge of the transverse process of L3. Under fluoroscopic guidance, a Radiofrequency needle was inserted until contact was  made with os over the superior postero-lateral aspect of the pedicular shadow (target area). Sensory and motor testing was conducted to properly adjust the position of the needle. Once satisfactory placement of the needle was achieved, the numbing solution was slowly injected after negative aspiration for blood. 2.0 mL of the nerve block solution was injected without difficulty or complication. After waiting for at least 3 minutes, the ablation was performed. Once completed, the needle was removed intact. L3 Medial Branch Nerve RFA: The target area for the L3 medial branch is at the junction of the postero-lateral aspect of the superior articular process and the superior, posterior, and medial edge of the transverse process of L4. Under fluoroscopic guidance, a Radiofrequency needle was inserted until contact was made with os over the superior postero-lateral aspect of the pedicular shadow (target area). Sensory and motor testing was conducted to properly adjust the position of the needle. Once satisfactory placement of the needle was achieved, the numbing solution was slowly injected after negative aspiration for blood. 2.0 mL of the nerve block solution was injected without difficulty or complication. After waiting for at least 3 minutes, the ablation was performed. Once completed, the needle was removed intact. L4 Medial Branch Nerve RFA: The target area for the L4 medial branch is at the junction of  the postero-lateral aspect of the superior articular process and the superior, posterior, and medial edge of the transverse process of L5. Under fluoroscopic guidance, a Radiofrequency needle was inserted until contact was made with os over the superior postero-lateral aspect of the pedicular shadow (target area). Sensory and motor testing was conducted to properly adjust the position of the needle. Once satisfactory placement of the needle was achieved, the numbing solution was slowly injected after negative aspiration for blood. 2.0 mL of the nerve block solution was injected without difficulty or complication. After waiting for at least 3 minutes, the ablation was performed. Once completed, the needle was removed intact. Radiofrequency lesioning (ablation):  Radiofrequency Generator: NeuroTherm [id number] Sensory Stimulation Parameters: 50 Hz was used to locate & identify the nerve, making sure that the needle was positioned such that there was no sensory stimulation below 0.3 V or above 0.7 V. Motor Stimulation Parameters: 2 Hz was used to evaluate the motor component. Care was taken not to lesion any nerves that demonstrated motor stimulation of the lower extremities at an output of less than 2.5 times that of the sensory threshold, or a maximum of 2.0 V. Lesioning Technique Parameters: Standard Radiofrequency settings. (Not bipolar or pulsed.) Temperature Settings: 80 degrees C Lesioning time: 60 seconds Intra-operative Compliance: Compliant Materials & Medications: Needle(s) (Electrode/Cannula) Type: Teflon-coated, curved tip, Radiofrequency needle(s) Gauge: 22G Length: 10cm Numbing solution: 0.2% PF-Ropivacaine + Triamcinolone (40 mg/mL) diluted to a final concentration of 4 mg of Triamcinolone/mL of Ropivacaine The unused portion of the solution was discarded in the proper designated containers.  Once the entire procedure was completed, the treated area was cleaned, making sure to leave some of  the prepping solution back to take advantage of its long term bactericidal properties.        Illustration of the posterior view of the lumbar spine and the posterior neural structures. Laminae of L2 through S1 are labeled. DPRL5, dorsal primary ramus of L5; DPRS1, dorsal primary ramus of S1; DPR3, dorsal primary ramus of L3; FJ, facet (zygapophyseal) joint L3-L4; I, inferior articular process of L4; LB1, lateral branch of dorsal primary ramus of L1; IAB, inferior articular branches from L3 medial branch (  supplies L4-L5 facet joint); IBP, intermediate branch plexus; MB3, medial branch of dorsal primary ramus of L3; NR3, third lumbar nerve root; S, superior articular process of L5; SAB, superior articular branches from L4 (supplies L4-5 facet joint also); TP3, transverse process of L3.  Vitals:   04/26/20 1108 04/26/20 1115 04/26/20 1125 04/26/20 1135  BP: (!) 81/50 111/64 114/67 113/71  Pulse:  68 70 72  Resp: 18 16 16 16   Temp:      SpO2: 92% 98% 100% 100%  Weight:      Height:       Start Time: 1035 hrs. End Time: 1108 hrs.  Imaging Guidance (Spinal):          Type of Imaging Technique: Fluoroscopy Guidance (Spinal) Indication(s): Assistance in needle guidance and placement for procedures requiring needle placement in or near specific anatomical locations not easily accessible without such assistance. Exposure Time: Please see nurses notes. Contrast: None used. Fluoroscopic Guidance: I was personally present during the use of fluoroscopy. "Tunnel Vision Technique" used to obtain the best possible view of the target area. Parallax error corrected before commencing the procedure. "Direction-depth-direction" technique used to introduce the needle under continuous pulsed fluoroscopy. Once target was reached, antero-posterior, oblique, and lateral fluoroscopic projection used confirm needle placement in all planes. Images permanently stored in EMR. Interpretation: No contrast injected. I  personally interpreted the imaging intraoperatively. Adequate needle placement confirmed in multiple planes. Permanent images saved into the patient's record.  Antibiotic Prophylaxis:   Anti-infectives (From admission, onward)   None     Indication(s): None identified  Post-operative Assessment:  Post-procedure Vital Signs:  Pulse/HCG Rate: 72 (nsr)67 Temp: (!) 96.8 F (36 C) Resp: 16 BP: 113/71 SpO2: 100 %  EBL: None  Complications: No immediate post-treatment complications observed by team, or reported by patient.  Note: The patient tolerated the entire procedure well. A repeat set of vitals were taken after the procedure and the patient was kept under observation following institutional policy, for this type of procedure. Post-procedural neurological assessment was performed, showing return to baseline, prior to discharge. The patient was provided with post-procedure discharge instructions, including a section on how to identify potential problems. Should any problems arise concerning this procedure, the patient was given instructions to immediately contact , at any time, without hesitation. In any case, we plan to contact the patient by telephone for a follow-up status report regarding this interventional procedure.  Comments:  No additional relevant information.  Plan of Care  Orders:  Orders Placed This Encounter  Procedures  . Radiofrequency,Thoracic    Scheduling Instructions:     Side(s): Left-sided     Level(s): T12 Medial Branch Level(s). (T12-L1 thoraco-lumbar facet joint)     Sedation: With Sedation.     Timeframe: Today    Order Specific Question:   Where will this procedure be performed?    Answer:   [facility]  . Radiofrequency,Lumbar    Scheduling Instructions:     Side(s): Left-sided     Level: L1, L2, and L3 MB. (T12-L1, L1-2, and L2-3 thoraco-lumbar facet joints.)     Sedation: With Sedation.     Timeframe: Today    Order Specific Question:    Where will this procedure be performed?    Answer:   [facility]  . DG PAIN [facility]IN[facility] C-ARM 1-60 MIN NO REPORT    Intraoperative interpretation by procedural physician at [facility].    Standing Status:   Standing  Number of Occurrences:   1    Order Specific Question:   Reason for exam:    Answer:   Assistance in needle guidance and placement for procedures requiring needle placement in or near specific anatomical locations not easily accessible without such assistance.  . Informed Consent Details: Physician/Practitioner Attestation; Transcribe to consent form and obtain patient signature    Nursing Order: Transcribe to consent form and obtain patient signature. Note: Always confirm laterality of pain with Angela Espinoza, before procedure.    Order Specific Question:   Physician/Practitioner attestation of informed consent for procedure/surgical case    Answer:   I, the physician/practitioner, attest that I have discussed with the patient the benefits, risks, side effects, alternatives, likelihood of achieving goals and potential problems during recovery for the procedure that I have provided informed consent.    Order Specific Question:   Procedure    Answer:   Lumbar Facet Radiofrequency Ablation    Order Specific Question:   Physician/Practitioner performing the procedure    Answer:   Halcyon Heck A. Laban Emperor, MD    Order Specific Question:   Indication/Reason    Answer:   Low Back Pain, with our without leg pain, due to Facet Joint Arthralgia (Joint Pain) known as Lumbar Facet Syndrome, secondary to Lumbar, and/or Lumbosacral Spondylosis (Arthritis of the Spine), without myelopathy or radiculopathy (Nerve Damage).  . Provide equipment / supplies at bedside    "Radiofrequency Tray"; Large hemostat (x1); Small hemostat (x1); Towels (x8); 4x4 sterile sponge pack (x1) Needle type: Teflon-coated Radiofrequency Needle (Disposable  single use) Size: Regular Quantity: 5     Standing Status:   Standing    Number of Occurrences:   1    Order Specific Question:   Specify    Answer:   Radiofrequency Tray  . Informed Consent Details: Physician/Practitioner Attestation; Transcribe to consent form and obtain patient signature    Note: Always confirm laterality of pain with Angela Espinoza, before procedure.    Order Specific Question:   Physician/Practitioner attestation of informed consent for procedure/surgical case    Answer:   I, the physician/practitioner, attest that I have discussed with the patient the benefits, risks, side effects, alternatives, likelihood of achieving goals and potential problems during recovery for the procedure that I have provided informed consent.    Order Specific Question:   Procedure    Answer:   Thoracic facet medial branch radiofrequency ablation (RFA)    Order Specific Question:   Physician/Practitioner performing the procedure    Answer:   Amairany Schumpert A. Laban Emperor, MD    Order Specific Question:   Indication/Reason    Answer:   Thoracic upper back pain associated with thoracic facet syndrome (Z61.096) and thoracic spondylosis without myelopathy or radiculopathy (M47.814)   Chronic Opioid Analgesic:  No opioid analgesics from our practice. MME/day:0 mg/day   Medications ordered for procedure: Meds ordered this encounter  Medications  . lidocaine (XYLOCAINE) 2 % (with pres) injection 400 mg  . lactated ringers infusion 1,000 mL  . midazolam (VERSED) 5 MG/5ML injection 1-2 mg    Make sure Flumazenil is available in the pyxis when using this medication. If oversedation occurs, administer 0.2 mg IV over 15 sec. If after 45 sec no response, administer 0.2 mg again over 1 min; may repeat at 1 min intervals; not to exceed 4 doses (1 mg)  . fentaNYL (SUBLIMAZE) injection 25-50 mcg    Make sure Narcan is available in the pyxis when using this medication. In the event  of respiratory depression (RR< 8/min): Titrate NARCAN (naloxone) in  increments of 0.1 to 0.2 mg IV at 2-3 minute intervals, until desired degree of reversal.  . ropivacaine (PF) 2 mg/mL (0.2%) (NAROPIN) injection 9 mL  . triamcinolone acetonide (KENALOG-40) injection 40 mg  . HYDROcodone-acetaminophen (NORCO/VICODIN) 5-325 MG tablet    Sig: Take 1 tablet by mouth every 6 (six) hours as needed for up to 7 days for severe pain. Must last 7 days.    Dispense:  28 tablet    Refill:  0    For acute post-operative pain. Not to be refilled. Must last 7 days.  Marland Kitchen HYDROcodone-acetaminophen (NORCO/VICODIN) 5-325 MG tablet    Sig: Take 1 tablet by mouth every 6 (six) hours as needed for up to 7 days for severe pain. Must last 7 days.    Dispense:  28 tablet    Refill:  0    For acute post-operative pain. Not to be refilled.  Must last 7 days.   Medications administered: We administered lidocaine, lactated ringers, midazolam, fentaNYL, ropivacaine (PF) 2 mg/mL (0.2%), and triamcinolone acetonide.  See the medical record for exact dosing, route, and time of administration.  Follow-up plan:   Return in about 6 weeks (around 06/07/2020) for on afternoon of procedure day, (VV), (PPE).       Interventional Therapies  Risk  Complexity Considerations:   WNL   Planned  Pending:   Therapeutic left thoracolumbar (T12, L1, L2, & L3) MB RFA #2    Under consideration:   Diagnostic left L1-2LESI #1   Completed:   Diagnostic left thoracolumbar facet MBB (T12, L1, L2, & L3) block x2  Therapeutic left thoracolumbar (T12, L1, L2, & L3) MB RFA x1 (09/30/2018) (100/100/50) Diagnostic left lumbar facet MBB x2 (03/29/2020)  Therapeutic left lumbar (L4, L5, S1) MB RFA x1 (07/28/2019) (100/100/100/100) Note: The patient has indicated that she has had prior lumbar facet radiofrequency ablations at [facility].   Palliative options:   Palliative left thoracolumbar (T12, L1, L2, & L3) MB RFA #2  Palliative left lumbar (L4, L5, S1) MB RFA #2      Recent Visits Date Type Provider Dept   04/12/20 Telemedicine Delano Metz, MD [facility]  03/29/20 Procedure visit Delano Metz, MD [facility]  Showing recent visits within past 90 days and meeting all other requirements Today's Visits Date Type Provider Dept  04/26/20 Procedure visit Delano Metz, MD [facility]  Showing today's visits and meeting all other requirements Future Appointments Date Type Provider Dept  06/07/20 Appointment Delano Metz, MD [facility]  Showing future appointments within next 90 days and meeting all other requirements  Disposition: Discharge home  Discharge (Date  Time): 04/26/2020; 1139 hrs.   Primary Care Physician: Toy Cookey, FNP Location: [facility] Note by: Oswaldo Done, MD Date: 04/26/2020; Time: 12:01 PM  Disclaimer:  Medicine is not an Visual merchandiser. The only guarantee in medicine is that nothing is guaranteed. It is important to note that the decision to proceed with this intervention was based on the information collected from the patient. The Data and conclusions were drawn from the patient's questionnaire, the interview, and the physical examination. Because the information was provided in large part by the patient, it cannot be guaranteed that it has not been purposely or unconsciously manipulated. Every effort has been made to obtain as much relevant data as possible for this evaluation. It is important to note that the conclusions that  lead to this procedure are derived in large part from the available data. Always take into account that the treatment will also be dependent on availability of resources and existing treatment guidelines, considered by other Pain Management Practitioners as being common knowledge and practice, at the time of the intervention. For Medico-Legal purposes, it is also important to point out that variation in procedural techniques and pharmacological  choices are the acceptable norm. The indications, contraindications, technique, and results of the above procedure should only be interpreted and judged by a Board-Certified Interventional Pain Specialist with extensive familiarity and expertise in the same exact procedure and technique.

## 2020-04-26 ENCOUNTER — Ambulatory Visit
Admission: RE | Admit: 2020-04-26 | Discharge: 2020-04-26 | Disposition: A | Discharge status: Home / Self Care | Payer: Medicare HMO | Source: Ambulatory Visit | Attending: Pain Medicine | Admitting: Pain Medicine

## 2020-04-26 ENCOUNTER — Encounter: Payer: Self-pay | Admitting: Pain Medicine

## 2020-04-26 ENCOUNTER — Other Ambulatory Visit: Payer: Self-pay

## 2020-04-26 ENCOUNTER — Ambulatory Visit (HOSPITAL_BASED_OUTPATIENT_CLINIC_OR_DEPARTMENT_OTHER): Payer: Medicare HMO | Admitting: Pain Medicine

## 2020-04-26 VITALS — BP 113/71 | HR 72 | Temp 96.8°F | Resp 16 | Ht 62.0 in | Wt 135.0 lb

## 2020-04-26 DIAGNOSIS — G8918 Other acute postprocedural pain: Secondary | ICD-10-CM

## 2020-04-26 DIAGNOSIS — M545 Low back pain, unspecified: Secondary | ICD-10-CM | POA: Diagnosis not present

## 2020-04-26 DIAGNOSIS — M47814 Spondylosis without myelopathy or radiculopathy, thoracic region: Secondary | ICD-10-CM

## 2020-04-26 DIAGNOSIS — M47816 Spondylosis without myelopathy or radiculopathy, lumbar region: Secondary | ICD-10-CM | POA: Diagnosis not present

## 2020-04-26 DIAGNOSIS — M5135 Other intervertebral disc degeneration, thoracolumbar region: Secondary | ICD-10-CM

## 2020-04-26 DIAGNOSIS — M47894 Other spondylosis, thoracic region: Secondary | ICD-10-CM

## 2020-04-26 DIAGNOSIS — Z9889 Other specified postprocedural states: Secondary | ICD-10-CM | POA: Insufficient documentation

## 2020-04-26 DIAGNOSIS — S32010S Wedge compression fracture of first lumbar vertebra, sequela: Secondary | ICD-10-CM | POA: Insufficient documentation

## 2020-04-26 DIAGNOSIS — G8929 Other chronic pain: Secondary | ICD-10-CM | POA: Insufficient documentation

## 2020-04-26 MED ORDER — MIDAZOLAM HCL 5 MG/5ML IJ SOLN
1.0000 mg | INTRAMUSCULAR | Status: DC | PRN
  Administered 2020-04-26: 0.5 mg via INTRAVENOUS
  Filled 2020-04-26: qty 5

## 2020-04-26 MED ORDER — TRIAMCINOLONE ACETONIDE 40 MG/ML IJ SUSP
40.0000 mg | Freq: Once | INTRAMUSCULAR | Status: AC
  Administered 2020-04-26: 40 mg
  Filled 2020-04-26: qty 1

## 2020-04-26 MED ORDER — LACTATED RINGERS IV SOLN
1000.0000 mL | Freq: Once | INTRAVENOUS | Status: AC
  Administered 2020-04-26: 1000 mL via INTRAVENOUS

## 2020-04-26 MED ORDER — HYDROCODONE-ACETAMINOPHEN 5-325 MG PO TABS: 1.0000 | ORAL_TABLET | Freq: Four times a day (QID) | ORAL | 0 refills | Status: AC | PRN

## 2020-04-26 MED ORDER — LIDOCAINE HCL 2 % IJ SOLN
20.0000 mL | Freq: Once | INTRAMUSCULAR | Status: AC
  Administered 2020-04-26: 400 mg
  Filled 2020-04-26: qty 40

## 2020-04-26 MED ORDER — ROPIVACAINE HCL 2 MG/ML IJ SOLN
9.0000 mL | Freq: Once | INTRAMUSCULAR | Status: AC
  Administered 2020-04-26: 9 mL via PERINEURAL
  Filled 2020-04-26: qty 10

## 2020-04-26 MED ORDER — FENTANYL CITRATE (PF) 100 MCG/2ML IJ SOLN
25.0000 ug | INTRAMUSCULAR | Status: DC | PRN
  Administered 2020-04-26: 25 ug via INTRAVENOUS
  Filled 2020-04-26: qty 2

## 2020-04-26 NOTE — Patient Instructions (Signed)

## 2020-04-26 NOTE — Progress Notes (Signed)
Safety precautions to be maintained throughout the outpatient stay will include: orient to surroundings, keep bed in low position, maintain call bell within reach at all times, provide assistance with transfer out of bed and ambulation.  

## 2020-04-27 ENCOUNTER — Telehealth: Payer: Self-pay | Admitting: *Deleted

## 2020-04-27 NOTE — Telephone Encounter (Signed)
Voicemail left with patient to please call our office if there are questions or concerns.

## 2020-05-02 DIAGNOSIS — Z13220 Encounter for screening for lipoid disorders: Secondary | ICD-10-CM | POA: Diagnosis not present

## 2020-05-02 DIAGNOSIS — E871 Hypo-osmolality and hyponatremia: Secondary | ICD-10-CM | POA: Diagnosis not present

## 2020-05-02 DIAGNOSIS — I10 Essential (primary) hypertension: Secondary | ICD-10-CM | POA: Diagnosis not present

## 2020-05-02 DIAGNOSIS — R928 Other abnormal and inconclusive findings on diagnostic imaging of breast: Secondary | ICD-10-CM | POA: Diagnosis not present

## 2020-05-09 DIAGNOSIS — E871 Hypo-osmolality and hyponatremia: Secondary | ICD-10-CM | POA: Diagnosis not present

## 2020-05-09 DIAGNOSIS — R5383 Other fatigue: Secondary | ICD-10-CM | POA: Diagnosis not present

## 2020-05-09 DIAGNOSIS — Z79899 Other long term (current) drug therapy: Secondary | ICD-10-CM | POA: Diagnosis not present

## 2020-06-05 NOTE — Progress Notes (Signed)
Patient: Angela Espinoza  Service Category: E/M  Provider: Gaspar Cola, MD  DOB: 06-25-1955  DOS: 06/07/2020  Location: Office  MRN: [id number]  Setting: Ambulatory outpatient  Referring Provider: Gennette Pac, FNP  Type: Established Patient  Specialty: Interventional Pain Management  PCP: Felicity Coyer, PA  Location: Remote location  Delivery: TeleHealth     Virtual Encounter - Pain Management PROVIDER NOTE: Information contained herein reflects review and annotations entered in association with encounter. Interpretation of such information and data should be left to medically-trained personnel. Information provided to patient can be located elsewhere in the medical record under "Patient Instructions". Document created using STT-dictation technology, any transcriptional errors that may result from process are unintentional.    Contact & Pharmacy Preferred: [place]: [phone] (home) Mobile: [phone] (mobile) E-mail: [email or web address]  [id number]: [phone]: [phone]  Pre-screening  [name] offered "in-person" vs "virtual" encounter. She indicated preferring virtual for this encounter.   Reason COVID-19*  Social distancing based on CDC and AMA recommendations.   I contacted BCharlea NardoCulberson on 06/07/2020 via telephone.      I clearly identified myself as FGaspar Cola MD. I verified that I was speaking with the correct person using two identifiers (Name: [name] and date of birth: 2March 17, 1957.  Consent I sought verbal advanced consent from BMadaline Savagefor virtual visit interactions. I informed [name] of possible security and privacy concerns, risks, and limitations associated with providing "not-in-person" medical evaluation and management services. I also informed [name] of the  availability of "in-person" appointments. Finally, I informed her that there would be a charge for the virtual visit and that she could be  personally, fully or partially, financially responsible for it. Ms. CPuschexpressed understanding and agreed to proceed.   Historic Elements   Ms. BOveta Idrisis a 65y.o. year old, female patient evaluated today after our last contact on 04/26/2020. [name] has a past medical history of Anxiety, Asthma, Depression, Hyperlipidemia, Hypertension, and IBS (irritable bowel syndrome). She also  has a past surgical history that includes Oophorectomy; Exploratory laparotomy; Foot surgery; Colonoscopy; Tonsillectomy; Axillary lymph node biopsy (Right, 08/03/2014); Kyphoplasty (N/A, 12/10/2014); Abdominal hysterectomy (age 65; Breast biopsy (Left, 10/07/2018); and Breast biopsy (Left, 10/21/2019). Ms. CBlanchethas a current medication list which includes the following prescription(s): acetaminophen, biotin, black elderberry, black pepper-turmeric, calcium carb-cholecalciferol, cholecalciferol, duloxetine, levocetirizine, losartan, magnesium oxide, meloxicam, metoprolol succinate, multivitamin, omega-3 fatty acids, ventolin hfa, and vitamin c. She  reports that she is a non-smoker but has been exposed to tobacco smoke. She has never used smokeless tobacco. She reports current alcohol use. She reports that she does not use drugs. Ms. CCrusoeis allergic to indapamide.   HPI  Today, she is being contacted for a post-procedure assessment.  The patient indicates having done really well until recently where her potassium went down to 131 and she was experiencing a lot of weakness and a lot of problems.  He indicates taking magnesium for this, which apparently does help bring her potassium.  I have recommended that to try some bananas but also to contact her primary care physician since potassium levels below 132 can cause a lot of problems not only with weakness,  but also with cardiac dysrhythmias.  The patient understood and indicated that she would be contacting her PCP.  Post-Procedure Evaluation  Procedure (04/26/2020): Therapeutic left thoracolumbar (  T12, L1, L2, and L3) medial branch RFA #2 under fluoroscopic guidance and IV sedation Pre-procedure pain level: 3/10 Post-procedure: 0/10 (100% relief)  Sedation: Sedation provided.  Effectiveness during initial hour after procedure(Ultra-Short Term Relief): 100 %.  Local anesthetic used: Long-acting (4-6 hours) Effectiveness: Defined as any analgesic benefit obtained secondary to the administration of local anesthetics. This carries significant diagnostic value as to the etiological location, or anatomical origin, of the pain. Duration of benefit is expected to coincide with the duration of the local anesthetic used.  Effectiveness during initial 4-6 hours after procedure(Short-Term Relief): 100 %.  Long-term benefit: Defined as any relief past the pharmacologic duration of the local anesthetics.  Effectiveness past the initial 6 hours after procedure(Long-Term Relief): 80 % (ongoing).  Current benefits: Defined as benefit that persist at this time.   Analgesia:  The patient indicates currently enjoying an 80% ongoing relief of her pain. Function: [name] reports improvement in function ROM: [name] reports improvement in ROM  Pharmacotherapy Assessment  Analgesic: No opioid analgesics from our practice. MME/day:0 mg/day   Monitoring: [place] PMP: PDMP reviewed during this encounter.       Pharmacotherapy: No side-effects or adverse reactions reported. Compliance: No problems identified. Effectiveness: Clinically acceptable. Plan: Refer to "POC".  UDS:  Summary  Date Value Ref Range Status  12/10/2017 FINAL  Final    Comment:    ==================================================================== TOXASSURE COMP DRUG  ANALYSIS,UR ==================================================================== Test                             Result       Flag       Units Drug Present and Declared for Prescription Verification   Cyclobenzaprine                PRESENT      EXPECTED   Amitriptyline                  PRESENT      EXPECTED   Metoprolol                     PRESENT      EXPECTED Drug Absent but Declared for Prescription Verification   Desmethylcyclobenzaprine       Not Detected UNEXPECTED   Duloxetine                     Not Detected UNEXPECTED ==================================================================== Test                      Result    Flag   Units      Ref Range   Creatinine              28               mg/dL      >=20 ==================================================================== Declared Medications:  The flagging and interpretation on this report are based on the  following declared medications.  Unexpected results may arise from  inaccuracies in the declared medications.  **Note: The testing scope of this panel includes these medications:  Amitriptyline  Cyclobenzaprine  Duloxetine  Metoprolol  **Note: The testing scope of this panel does not include following  reported medications:  Cholecalciferol  Hydrochlorothiazide  Levocetirizine  Losartan (Losartan Potassium)  Magnesium  Meloxicam  Multivitamin  Supplement  Supplement (Omega-3)  Vitamin B (Biotin)  Vitamin C ==================================================================== For clinical consultation, please call [phone]. ====================================================================     Laboratory Chemistry Profile   Renal Lab Results  Component Value Date   BUN 10 01/01/2018   CREATININE 0.88 01/01/2018   BCR 11 (L) 01/01/2018   GFRAA 81 01/01/2018   GFRNONAA 70 01/01/2018     Hepatic Lab Results  Component Value Date   AST 26 01/01/2018   ALT 42 08/10/2011   ALBUMIN 4.6  01/01/2018   ALKPHOS 73 01/01/2018     Electrolytes Lab Results  Component Value Date   NA 136 01/01/2018   K 4.5 01/01/2018   CL 98 01/01/2018   CALCIUM 9.9 01/01/2018   MG 2.3 12/10/2017   PHOS 1.6 (L) 08/10/2011     Bone Lab Results  Component Value Date   25OHVITD1 40 01/01/2018   25OHVITD2 <1.0 01/01/2018   25OHVITD3 40 01/01/2018     Inflammation (CRP: Acute Phase) (ESR: Chronic Phase) Lab Results  Component Value Date   CRP 1 12/10/2017   ESRSEDRATE 3 12/10/2017       Note: Above Lab results reviewed.  Imaging  DG PAIN CLINIC C-ARM 1-60 MIN NO REPORT Fluoro was used, but no Radiologist interpretation will be provided.  Please refer to "NOTES" tab for provider progress note.  Assessment  There were no encounter diagnoses.  Plan of Care  Problem-specific:  No problem-specific Assessment & Plan notes found for this encounter.  Ms. Iara Monds has a current medication list which includes the following long-term medication(s): calcium carb-cholecalciferol, levocetirizine, losartan, metoprolol succinate, and ventolin hfa.  Pharmacotherapy (Medications Ordered): No orders of the defined types were placed in this encounter.  Orders:  No orders of the defined types were placed in this encounter.  Follow-up plan:   No follow-ups on file.      Interventional Therapies  Risk  Complexity Considerations:   WNL   Planned  Pending:   Therapeutic left thoracolumbar (T12, L1, L2, & L3) MB RFA #2    Under consideration:   Diagnostic left L1-2LESI #1   Completed:   Diagnostic left thoracolumbar facet MBB (T12, L1, L2, & L3) block x2  Therapeutic left thoracolumbar (T12, L1, L2, & L3) MB RFA x2 (04/26/2020) (100/100/80) Diagnostic left lumbar facet MBB x2 (03/29/2020)  Therapeutic left lumbar (L4, L5, S1) MB RFA x1 (07/28/2019) (100/100/100/100) Note: The patient has indicated that she has had prior lumbar facet radiofrequency ablations at [facility].    Palliative options:   Palliative left thoracolumbar (T12, L1, L2, & L3) MB RFA #2  Palliative left lumbar (L4, L5, S1) MB RFA #2       Recent Visits Date Type Provider Dept  04/26/20 Procedure visit Milinda Pointer, MD [facility]  04/12/20 Telemedicine Milinda Pointer, MD [facility]  03/29/20 Procedure visit Milinda Pointer, MD [facility]  Showing recent visits within past 90 days and meeting all other requirements Today's Visits Date Type Provider Dept  06/07/20 Telemedicine Milinda Pointer, MD [facility]  Showing today's visits and meeting all other requirements Future Appointments No visits were found meeting these conditions. Showing future appointments within next 90 days and meeting all other requirements  I discussed the assessment and treatment plan with the patient. The patient was provided an opportunity to ask questions and all were answered. The patient agreed with the plan and demonstrated an understanding of the instructions.  Patient advised to call back or seek an in-person evaluation if the symptoms or condition worsens.  Duration of encounter: 15 minutes.  Note  by: Gaspar Cola, MD Date: 06/07/2020; Time: 6:57 PM

## 2020-06-07 ENCOUNTER — Other Ambulatory Visit: Payer: Self-pay

## 2020-06-07 ENCOUNTER — Ambulatory Visit: Payer: Medicare HMO | Attending: Pain Medicine | Admitting: Pain Medicine

## 2020-06-07 DIAGNOSIS — M47814 Spondylosis without myelopathy or radiculopathy, thoracic region: Secondary | ICD-10-CM | POA: Diagnosis not present

## 2020-06-23 DIAGNOSIS — E871 Hypo-osmolality and hyponatremia: Secondary | ICD-10-CM | POA: Diagnosis not present

## 2020-06-23 DIAGNOSIS — R69 Illness, unspecified: Secondary | ICD-10-CM | POA: Diagnosis not present

## 2020-06-23 DIAGNOSIS — M47814 Spondylosis without myelopathy or radiculopathy, thoracic region: Secondary | ICD-10-CM | POA: Diagnosis not present

## 2020-07-04 DIAGNOSIS — Z0000 Encounter for general adult medical examination without abnormal findings: Secondary | ICD-10-CM | POA: Diagnosis not present

## 2020-07-04 DIAGNOSIS — Z139 Encounter for screening, unspecified: Secondary | ICD-10-CM | POA: Diagnosis not present

## 2020-09-06 DIAGNOSIS — I10 Essential (primary) hypertension: Secondary | ICD-10-CM | POA: Diagnosis not present

## 2020-09-06 DIAGNOSIS — R69 Illness, unspecified: Secondary | ICD-10-CM | POA: Diagnosis not present

## 2020-11-08 DIAGNOSIS — Z1389 Encounter for screening for other disorder: Secondary | ICD-10-CM | POA: Diagnosis not present

## 2020-11-08 DIAGNOSIS — Z7189 Other specified counseling: Secondary | ICD-10-CM | POA: Diagnosis not present

## 2020-11-08 DIAGNOSIS — Z23 Encounter for immunization: Secondary | ICD-10-CM | POA: Diagnosis not present

## 2020-11-08 DIAGNOSIS — M47814 Spondylosis without myelopathy or radiculopathy, thoracic region: Secondary | ICD-10-CM | POA: Diagnosis not present

## 2020-11-08 DIAGNOSIS — Z0000 Encounter for general adult medical examination without abnormal findings: Secondary | ICD-10-CM | POA: Diagnosis not present

## 2020-11-08 DIAGNOSIS — I10 Essential (primary) hypertension: Secondary | ICD-10-CM | POA: Diagnosis not present

## 2020-11-08 DIAGNOSIS — E871 Hypo-osmolality and hyponatremia: Secondary | ICD-10-CM | POA: Diagnosis not present

## 2020-11-14 ENCOUNTER — Other Ambulatory Visit: Payer: Self-pay | Admitting: Physician Assistant

## 2020-11-14 DIAGNOSIS — Z1231 Encounter for screening mammogram for malignant neoplasm of breast: Secondary | ICD-10-CM

## 2020-11-27 NOTE — Progress Notes (Signed)
PROVIDER NOTE: Information contained herein reflects review and annotations entered in association with encounter. Interpretation of such information and data should be left to medically-trained personnel. Information provided to patient can be located elsewhere in the medical record under "Patient Instructions". Document created using STT-dictation technology, any transcriptional errors that may result from process are unintentional.    Patient: Angela Espinoza  Service Category: E/M  Provider: Gaspar Cola, MD  DOB: 1956-01-27  DOS: 11/28/2020  Specialty: Interventional Pain Management  MRN: [id number]  Setting: Ambulatory outpatient  PCP: Ane Payment, PA  Type: Established Patient    Referring Provider: Marylen Ponto*  Location: Office  Delivery: Face-to-face     HPI  Angela Espinoza, a 65 y.o. year old female, is here today because of her Chronic left-sided low back pain without sciatica [M54.50, G89.29]. [name]'s primary complain today is Back Pain (lower) Last encounter: My last encounter with her was on Visit date not found. Pertinent problems: [name] has Chronic low back pain (1ry area of Pain) (Left) w/o sciatica; Chronic pain syndrome; History of L1 kyphoplasty; Traumatic compression fracture of L1 lumbar vertebra, sequela (2016); Spondylosis without myelopathy or radiculopathy, lumbar region; Lumbar facet syndrome (Left); DDD (degenerative disc disease), lumbosacral; Thoracic facet syndrome (Left); Spondylosis without myelopathy or radiculopathy, lumbosacral region; Spondylosis without myelopathy or radiculopathy, thoracic region; DDD (degenerative disc disease), thoracolumbar; Acute postoperative pain; and Neurogenic pain on their pertinent problem list. Pain Assessment: Severity of Chronic pain is reported as a 9 /10. Location: Back Left, Right/Denies. Onset: More than a month ago. Quality: Aching, Sharp, Throbbing, Constant.  Timing: Constant. Modifying factor(s): meds, laying down. Vitals:  height is 5' 2"  (1.575 m) and weight is 131 lb (59.4 kg). Her temperature is 96.5 F (35.8 C) (abnormal). Her blood pressure is 137/90 and her pulse is 122 (abnormal). Her respiration is 17 and oxygen saturation is 100%.   Reason for encounter: worsening of previously known (established) problem.   She indicates that her left lower back pain is coming back.  Part of it has to do with the fact that her last radiofrequency ablation was on June of last year meaning that it has probably worn off and needs to be repeated.  We will start by doing a diagnostic left lumbar facet block and if this provides her with good relief of the pain, at least for the duration of the local anesthetic, then we will schedule that radiofrequency again.  However, because the patient has a history of vertebral body fractures, today we will go ahead and update her lumbar x-rays.   The patient refers that recently she had some lab work done which led to a decrease in her gabapentin dose.  Since then, her symptoms have worsened.  I do not have access to that "lab work" and therefore I could not really explain to her what was going on.  However, I have encouraged her to go back to her PCP and report to them the worsening of her pain with a recent decrease in the gabapentin.  Pharmacotherapy Assessment  Analgesic: No opioid analgesics from our practice. MME/day: 0 mg/day   Monitoring: [place] PMP: PDMP reviewed during this encounter.       Pharmacotherapy: No side-effects or adverse reactions reported. Compliance: No problems identified. Effectiveness: Clinically acceptable.  Chauncey Fischer, RN  11/28/2020  8:59 AM  Sign when Signing Visit Safety precautions to be maintained throughout the outpatient stay will include: orient to surroundings, keep  bed in low position, maintain call bell within reach at all times, provide assistance with transfer out of bed and  ambulation.      UDS:  Summary  Date Value Ref Range Status  12/10/2017 FINAL  Final    Comment:    ==================================================================== [facility] COMP DRUG ANALYSIS,UR ==================================================================== Test                             Result       Flag       Units Drug Present and Declared for Prescription Verification   Cyclobenzaprine                PRESENT      EXPECTED   Amitriptyline                  PRESENT      EXPECTED   Metoprolol                     PRESENT      EXPECTED Drug Absent but Declared for Prescription Verification   Desmethylcyclobenzaprine       Not Detected UNEXPECTED   Duloxetine                     Not Detected UNEXPECTED ==================================================================== Test                      Result    Flag   Units      Ref Range   Creatinine              28               mg/dL      >=20 ==================================================================== Declared Medications:  The flagging and interpretation on this report are based on the  following declared medications.  Unexpected results may arise from  inaccuracies in the declared medications.  **Note: The testing scope of this panel includes these medications:  Amitriptyline  Cyclobenzaprine  Duloxetine  Metoprolol  **Note: The testing scope of this panel does not include following  reported medications:  Cholecalciferol  Hydrochlorothiazide  Levocetirizine  Losartan (Losartan Potassium)  Magnesium  Meloxicam  Multivitamin  Supplement  Supplement (Omega-3)  Vitamin B (Biotin)  Vitamin C ==================================================================== For clinical consultation, please call [phone]. ====================================================================      ROS  Constitutional: Denies any fever or chills Gastrointestinal: No reported hemesis, hematochezia, vomiting, or  acute GI distress Musculoskeletal: Denies any acute onset joint swelling, redness, loss of ROM, or weakness Neurological: No reported episodes of acute onset apraxia, aphasia, dysarthria, agnosia, amnesia, paralysis, loss of coordination, or loss of consciousness  Medication Review  Biotin, Black Elderberry, Black Pepper-Turmeric, Calcium Carb-Cholecalciferol, DULoxetine, Omega-3 Fatty Acids, acetaminophen, gabapentin, losartan, meloxicam, multivitamin, and vitamin C  History Review  Allergy: Ms. Daniel is allergic to indapamide. Drug: Ms. Mcglaun  reports no history of drug use. Alcohol:  reports current alcohol use. Tobacco:  reports that she is a non-smoker but has been exposed to tobacco smoke. She has never used smokeless tobacco. Social: Ms. Khaimov  reports that she is a non-smoker but has been exposed to tobacco smoke. She has never used smokeless tobacco. She reports current alcohol use. She reports that she does not use drugs. Medical:  has a past medical history of Anxiety, Asthma, Depression, Hyperlipidemia, Hypertension, and IBS (irritable bowel syndrome). Surgical: Ms. Astorino  has a past surgical  history that includes Oophorectomy; Exploratory laparotomy; Foot surgery; Colonoscopy; Tonsillectomy; Axillary lymph node biopsy (Right, 08/03/2014); Kyphoplasty (N/A, 12/10/2014); Abdominal hysterectomy (age 38); Breast biopsy (Left, 10/07/2018); and Breast biopsy (Left, 10/21/2019). Family: family history includes Heart attack in her mother; Lung cancer in her father.  Laboratory Chemistry Profile   Renal Lab Results  Component Value Date   BUN 10 01/01/2018   CREATININE 0.88 01/01/2018   BCR 11 (L) 01/01/2018   GFRAA 81 01/01/2018   GFRNONAA 70 01/01/2018    Hepatic Lab Results  Component Value Date   AST 26 01/01/2018   ALT 42 08/10/2011   ALBUMIN 4.6 01/01/2018   ALKPHOS 73 01/01/2018    Electrolytes Lab Results  Component Value Date   NA 136 01/01/2018   K  4.5 01/01/2018   CL 98 01/01/2018   CALCIUM 9.9 01/01/2018   MG 2.3 12/10/2017   PHOS 1.6 (L) 08/10/2011    Bone Lab Results  Component Value Date   25OHVITD1 40 01/01/2018   25OHVITD2 <1.0 01/01/2018   25OHVITD3 40 01/01/2018    Inflammation (CRP: Acute Phase) (ESR: Chronic Phase) Lab Results  Component Value Date   CRP 1 12/10/2017   ESRSEDRATE 3 12/10/2017         Note: Above Lab results reviewed.  Recent Imaging Review  DG PAIN CLINIC C-ARM 1-60 MIN NO REPORT Fluoro was used, but no Radiologist interpretation will be provided.  Please refer to "NOTES" tab for provider progress note. Note: Reviewed        Physical Exam  General appearance: Well nourished, well developed, and well hydrated. In no apparent acute distress Mental status: Alert, oriented x 3 (person, place, & time)       Respiratory: No evidence of acute respiratory distress Eyes: PERLA Vitals: BP 137/90   Pulse (!) 122   Temp (!) 96.5 F (35.8 C)   Resp 17   Ht 5' 2"  (1.575 m)   Wt 131 lb (59.4 kg)   SpO2 100%   BMI 23.96 kg/m  BMI: Estimated body mass index is 23.96 kg/m as calculated from the following:   Height as of this encounter: 5' 2"  (1.575 m).   Weight as of this encounter: 131 lb (59.4 kg). Ideal: Ideal body weight: 50.1 kg (110 lb 7.2 oz) Adjusted ideal body weight: 53.8 kg (118 lb 10.7 oz)  Assessment   Status Diagnosis  Controlled Controlled Controlled 1. Chronic low back pain (1ry area of Pain) (Left) w/o sciatica   2. DDD (degenerative disc disease), lumbosacral   3. Lumbar facet syndrome (Left)   4. Spondylosis without myelopathy or radiculopathy, lumbosacral region      Updated Problems: No problems updated.  Plan of Care  Problem-specific:  No problem-specific Assessment & Plan notes found for this encounter.  Ms. Carsynn Bethune has a current medication list which includes the following long-term medication(s): calcium carb-cholecalciferol and  losartan.  Pharmacotherapy (Medications Ordered): No orders of the defined types were placed in this encounter.  Orders:  Orders Placed This Encounter  Procedures   LUMBAR FACET(MEDIAL BRANCH NERVE BLOCK) MBNB    Standing Status:   Future    Standing Expiration Date:   02/28/2021    Scheduling Instructions:     Procedure: Lumbar facet block (AKA.: Lumbosacral medial branch nerve block)     Side: Left-sided     Level: L3-4, L4-5, & L5-S1 Facets (L2, L3, L4, L5, & S1 Medial Branch Nerves)     Sedation: Patient's choice.  Timeframe: ASAA    Order Specific Question:   Where will this procedure be performed?    Answer:   [facility]   DG Lumbar Spine Complete W/Bend    Patient presents with axial pain with possible radicular component. Please assist [place] in identifying specific level(s) and laterality of any additional findings such as: 1. Facet (Zygapophyseal) joint DJD (Hypertrophy, space narrowing, subchondral sclerosis, and/or osteophyte formation) 2. DDD and/or IVDD (Loss of disc height, desiccation, gas patterns, osteophytes, endplate sclerosis, or "Black disc disease") 3. Pars defects 4. Spondylolisthesis, spondylosis, and/or spondyloarthropathies (include Degree/Grade of displacement in mm) (stability) 5. Vertebral body Fractures (acute/chronic) (state percentage of collapse) 6. Demineralization (osteopenia/osteoporotic) 7. Bone pathology 8. Foraminal narrowing  9. Surgical changes    Standing Status:   Future    Standing Expiration Date:   12/28/2020    Scheduling Instructions:     Imaging must be done as soon as possible. Inform patient that order will expire within 30 days and I will not renew it.    Order Specific Question:   Reason for Exam (SYMPTOM  OR DIAGNOSIS REQUIRED)    Answer:   Low back pain    Order Specific Question:   Preferred imaging location?    Answer:   [facility]    Order Specific Question:   Call Results- Best Contact Number?    Answer:    [phone] ([facility])    Order Specific Question:   Radiology Contrast Protocol - do NOT remove file path    Answer:   [email or web address]    Order Specific Question:   Release to patient    Answer:   Immediate   Informed Consent Details: Physician/Practitioner Attestation; Transcribe to consent form and obtain patient signature    Nursing Order: Transcribe to consent form and obtain patient signature. Note: Always confirm laterality of pain with Ms. Delucia, before procedure.    Order Specific Question:   Physician/Practitioner attestation of informed consent for procedure/surgical case    Answer:   I, the physician/practitioner, attest that I have discussed with the patient the benefits, risks, side effects, alternatives, likelihood of achieving goals and potential problems during recovery for the procedure that I have provided informed consent.    Order Specific Question:   Procedure    Answer:   Lumbar Facet Block  under fluoroscopic guidance    Order Specific Question:   Physician/Practitioner performing the procedure    Answer:   Quention Mcneill A. Dossie Arbour MD    Order Specific Question:   Indication/Reason    Answer:   Low Back Pain, with our without leg pain, due to Facet Joint Arthralgia (Joint Pain) Spondylosis (Arthritis of the Spine), without myelopathy or radiculopathy (Nerve Damage).    Follow-up plan:   Return for (Clinic) procedure: (L) L-FCT MBB, (Sed-anx).     Interventional Therapies  Risk  Complexity Considerations:   WNL   Planned  Pending:   Therapeutic left thoracolumbar (T12, L1, L2, & L3) MB RFA #2    Under consideration:   Diagnostic left L1-2 LESI #1    Completed:   Diagnostic left thoracolumbar facet MBB (T12, L1, L2, & L3) block x2  Therapeutic left thoracolumbar (T12, L1, L2, & L3) MB RFA x2 (04/26/2020) (100/100/80) Diagnostic left lumbar facet MBB x2 (03/29/2020)  Therapeutic left lumbar (L4, L5, S1) MB  RFA x1 (07/28/2019) (100/100/100/100) Note: The patient has indicated that she has had prior lumbar facet radiofrequency ablations at [facility].   Palliative options:  Palliative left thoracolumbar (T12, L1, L2, & L3) MB RFA #2  Palliative left lumbar (L4, L5, S1) MB RFA #2     Recent Visits No visits were found meeting these conditions. Showing recent visits within past 90 days and meeting all other requirements Today's Visits Date Type Provider Dept  11/28/20 Office Visit Milinda Pointer, MD [facility]  Showing today's visits and meeting all other requirements Future Appointments No visits were found meeting these conditions. Showing future appointments within next 90 days and meeting all other requirements I discussed the assessment and treatment plan with the patient. The patient was provided an opportunity to ask questions and all were answered. The patient agreed with the plan and demonstrated an understanding of the instructions.  Patient advised to call back or seek an in-person evaluation if the symptoms or condition worsens.  Duration of encounter: 30 minutes.  Note by: Gaspar Cola, MD Date: 11/28/2020; Time: 9:46 AM

## 2020-11-28 ENCOUNTER — Other Ambulatory Visit: Payer: Self-pay

## 2020-11-28 ENCOUNTER — Ambulatory Visit
Admission: RE | Admit: 2020-11-28 | Discharge: 2020-11-28 | Disposition: A | Discharge status: Home / Self Care | Payer: Medicare HMO | Source: Ambulatory Visit | Attending: Pain Medicine | Admitting: Pain Medicine

## 2020-11-28 ENCOUNTER — Ambulatory Visit
Admission: RE | Admit: 2020-11-28 | Discharge: 2020-11-28 | Disposition: A | Discharge status: Home / Self Care | Payer: Medicare HMO | Attending: Pain Medicine | Admitting: Pain Medicine

## 2020-11-28 ENCOUNTER — Ambulatory Visit: Payer: Medicare HMO | Attending: Pain Medicine | Admitting: Pain Medicine

## 2020-11-28 ENCOUNTER — Encounter: Payer: Self-pay | Admitting: Pain Medicine

## 2020-11-28 VITALS — BP 137/90 | HR 122 | Temp 96.5°F | Resp 17 | Ht 62.0 in | Wt 131.0 lb

## 2020-11-28 DIAGNOSIS — M545 Low back pain, unspecified: Secondary | ICD-10-CM | POA: Diagnosis not present

## 2020-11-28 DIAGNOSIS — M5137 Other intervertebral disc degeneration, lumbosacral region: Secondary | ICD-10-CM

## 2020-11-28 DIAGNOSIS — G8929 Other chronic pain: Secondary | ICD-10-CM | POA: Insufficient documentation

## 2020-11-28 DIAGNOSIS — M47817 Spondylosis without myelopathy or radiculopathy, lumbosacral region: Secondary | ICD-10-CM | POA: Insufficient documentation

## 2020-11-28 DIAGNOSIS — M47816 Spondylosis without myelopathy or radiculopathy, lumbar region: Secondary | ICD-10-CM | POA: Diagnosis not present

## 2020-11-28 DIAGNOSIS — S32010A Wedge compression fracture of first lumbar vertebra, initial encounter for closed fracture: Secondary | ICD-10-CM | POA: Diagnosis not present

## 2020-11-28 NOTE — Progress Notes (Signed)
Safety precautions to be maintained throughout the outpatient stay will include: orient to surroundings, keep bed in low position, maintain call bell within reach at all times, provide assistance with transfer out of bed and ambulation.  

## 2020-11-28 NOTE — Patient Instructions (Addendum)
______________________________________________________________________  Preparing for Procedure with Sedation  NOTICE: Due to recent regulatory changes, starting on August 29, 2020, procedures requiring intravenous (IV) sedation will no longer be performed at the [facility].  These types of procedures are required to be performed at [facility] ambulatory surgery facility.  We are very sorry for the inconvenience.  Procedure appointments are limited to planned procedures: No Prescription Refills. No disability issues will be discussed. No medication changes will be discussed.  Instructions: Oral Intake: Do not eat or drink anything for at least 8 hours prior to your procedure. (Exception: Blood Pressure Medication. See below.) Transportation: A driver is required. You may not drive yourself after the procedure. Blood Pressure Medicine: Do not forget to take your blood pressure medicine with a sip of water the morning of the procedure. If your Diastolic (lower reading) is above 100 mmHg, elective cases will be cancelled/rescheduled. Blood thinners: These will need to be stopped for procedures. Notify our staff if you are taking any blood thinners. Depending on which one you take, there will be specific instructions on how and when to stop it. Diabetics on insulin: Notify the staff so that you can be scheduled 1st case in the morning. If your diabetes requires high dose insulin, take only  of your normal insulin dose the morning of the procedure and notify the staff that you have done so. Preventing infections: Shower with an antibacterial soap the morning of your procedure. Build-up your immune system: Take 1000 mg of Vitamin C with every meal (3 times a day) the day prior to your procedure. Antibiotics: Inform the staff if you have a condition or reason that requires you to take antibiotics before dental procedures. Pregnancy: If you are pregnant, call and cancel the procedure. Sickness: If  you have a cold, fever, or any active infections, call and cancel the procedure. Arrival: You must be in the facility at least 30 minutes prior to your scheduled procedure. Children: Do not bring children with you. Dress appropriately: Bring dark clothing that you would not mind if they get stained. Valuables: Do not bring any jewelry or valuables.  Reasons to call and reschedule or cancel your procedure: (Following these recommendations will minimize the risk of a serious complication.) Surgeries: Avoid having procedures within 2 weeks of any surgery. (Avoid for 2 weeks before or after any surgery). Flu Shots: Avoid having procedures within 2 weeks of a flu shots. (Avoid for 2 weeks before or after immunizations). Barium: Avoid having a procedure within 7-10 days after having had a radiological study involving the use of radiological contrast. (Myelograms, Barium swallow or enema study). Heart attacks: Avoid any elective procedures or surgeries for the initial 6 months after a "Myocardial Infarction" (Heart Attack). Blood thinners: It is imperative that you stop these medications before procedures. Let us know if you if you take any blood thinner.  Infection: Avoid procedures during or within two weeks of an infection (including chest colds or gastrointestinal problems). Symptoms associated with infections include: Localized redness, fever, chills, night sweats or profuse sweating, burning sensation when voiding, cough, congestion, stuffiness, runny nose, sore throat, diarrhea, nausea, vomiting, cold or Flu symptoms, recent or current infections. It is specially important if the infection is over the area that we intend to treat. Heart and lung problems: Symptoms that may suggest an active cardiopulmonary problem include: cough, chest pain, breathing difficulties or shortness of breath, dizziness, ankle swelling, uncontrolled high or unusually low blood pressure, and/or palpitations. If you are  experiencing any of these symptoms, cancel your procedure and contact your primary care physician for an evaluation.  Remember:  Regular Business hours are:  Monday to Thursday 8:00 AM to 4:00 PM  Provider's Schedule: Raymir Frommelt, MD:  Procedure days: Tuesday and Thursday 7:30 AM to 4:00 PM  Bilal Lateef, MD:  Procedure days: Monday and Wednesday 7:30 AM to 4:00 PM ______________________________________________________________________  ____________________________________________________________________________________________  General Risks and Possible Complications  Patient Responsibilities: It is important that you read this as it is part of your informed consent. It is our duty to inform you of the risks and possible complications associated with treatments offered to you. It is your responsibility as a patient to read this and to ask questions about anything that is not clear or that you believe was not covered in this document.  Patient's Rights: You have the right to refuse treatment. You also have the right to change your mind, even after initially having agreed to have the treatment done. However, under this last option, if you wait until the last second to change your mind, you may be charged for the materials used up to that point.  Introduction: Medicine is not an exact science. Everything in Medicine, including the lack of treatment(s), carries the potential for danger, harm, or loss (which is by definition: Risk). In Medicine, a complication is a secondary problem, condition, or disease that can aggravate an already existing one. All treatments carry the risk of possible complications. The fact that a side effects or complications occurs, does not imply that the treatment was conducted incorrectly. It must be clearly understood that these can happen even when everything is done following the highest safety standards.  No treatment: You can choose not to proceed with the  proposed treatment alternative. The "PRO(s)" would include: avoiding the risk of complications associated with the therapy. The "CON(s)" would include: not getting any of the treatment benefits. These benefits fall under one of three categories: diagnostic; therapeutic; and/or palliative. Diagnostic benefits include: getting information which can ultimately lead to improvement of the disease or symptom(s). Therapeutic benefits are those associated with the successful treatment of the disease. Finally, palliative benefits are those related to the decrease of the primary symptoms, without necessarily curing the condition (example: decreasing the pain from a flare-up of a chronic condition, such as incurable terminal cancer).  General Risks and Complications: These are associated to most interventional treatments. They can occur alone, or in combination. They fall under one of the following six (6) categories: no benefit or worsening of symptoms; bleeding; infection; nerve damage; allergic reactions; and/or death. No benefits or worsening of symptoms: In Medicine there are no guarantees, only probabilities. No healthcare provider can ever guarantee that a medical treatment will work, they can only state the probability that it may. Furthermore, there is always the possibility that the condition may worsen, either directly, or indirectly, as a consequence of the treatment. Bleeding: This is more common if the patient is taking a blood thinner, either prescription or over the counter (example: Goody Powders, Fish oil, Aspirin, Garlic, etc.), or if suffering a condition associated with impaired coagulation (example: Hemophilia, cirrhosis of the liver, low platelet counts, etc.). However, even if you do not have one on these, it can still happen. If you have any of these conditions, or take one of these drugs, make sure to notify your treating physician. Infection: This is more common in patients with a compromised  immune system, either due to disease (example:   diabetes, cancer, human immunodeficiency virus [HIV], etc.), or due to medications or treatments (example: therapies used to treat cancer and rheumatological diseases). However, even if you do not have one on these, it can still happen. If you have any of these conditions, or take one of these drugs, make sure to notify your treating physician. Nerve Damage: This is more common when the treatment is an invasive one, but it can also happen with the use of medications, such as those used in the treatment of cancer. The damage can occur to small secondary nerves, or to large primary ones, such as those in the spinal cord and brain. This damage may be temporary or permanent and it may lead to impairments that can range from temporary numbness to permanent paralysis and/or brain death. Allergic Reactions: Any time a substance or material comes in contact with our body, there is the possibility of an allergic reaction. These can range from a mild skin rash (contact dermatitis) to a severe systemic reaction (anaphylactic reaction), which can result in death. Death: In general, any medical intervention can result in death, most of the time due to an unforeseen complication. ____________________________________________________________________________________________ ____________________________________________________________________________________________  CBD (cannabidiol) & Delta-8 (Delta-8 tetrahydrocannabinol) WARNING  Intro: Cannabidiol (CBD) and tetrahydrocannabinol (THC), are two natural compounds found in plants of the Cannabis genus. They can both be extracted from hemp or cannabis. Hemp and cannabis come from the Cannabis sativa plant. Both compounds interact with your body's endocannabinoid system, but they have very different effects. CBD does not produce the high sensation associated with cannabis. Delta-8 tetrahydrocannabinol, also known as delta-8 THC,  is a psychoactive substance found in the Cannabis sativa plant, of which marijuana and hemp are two varieties. THC is responsible for the high associated with the illicit use of marijuana.  Applicable to: All individuals currently taking or considering taking CBD (cannabidiol) and, more important, all patients taking opioid analgesic controlled substances (pain medication). (Example: oxycodone; oxymorphone; hydrocodone; hydromorphone; morphine; methadone; tramadol; tapentadol; fentanyl; buprenorphine; butorphanol; dextromethorphan; meperidine; codeine; etc.)  Legal status: CBD remains a Schedule I drug prohibited for any use. CBD is illegal with one exception. In the [place], CBD has a limited Education officer, environmental (FDA) approval for the treatment of two specific types of epilepsy disorders. Only one CBD product has been approved by the FDA for this purpose: "Epidiolex". FDA is aware that some companies are marketing products containing cannabis and cannabis-derived compounds in ways that violate the [employer], Drug and Cosmetic Act [facility] Act) and that may put the health and safety of consumers at risk. The FDA, a Federal agency, has not enforced the CBD status since 2018.   Legality: Some manufacturers ship CBD products nationally, which is illegal. Often such products are sold online and are therefore available throughout the country. CBD is openly sold in head shops and health food stores in some states where such sales have not been explicitly legalized. Selling unapproved products with unsubstantiated therapeutic claims is not only a violation of the law, but also can put patients at risk, as these products have not been proven to be safe or effective. Federal illegality makes it difficult to conduct research on CBD.  Reference: "FDA Regulation of Cannabis and Cannabis-Derived Products, Including Cannabidiol (CBD)" -  [employer]  Warning: CBD is not FDA approved and has not undergo the same manufacturing controls as prescription drugs.  This means that the purity and safety of available CBD may be questionable. Most of the time, despite manufacturer's  claims, it is contaminated with THC (delta-9-tetrahydrocannabinol - the chemical in marijuana responsible for the "HIGH").  When this is the case, the [facility] contaminant will trigger a positive urine drug screen (UDS) test for Marijuana (carboxy-THC). Because a positive UDS for any illicit substance is a violation of our medication agreement, your opioid analgesics (pain medicine) may be permanently discontinued. The FDA recently put out a warning about 5 things that everyone should be aware of regarding Delta-8 THC: Delta-8 THC products have not been evaluated or approved by the FDA for safe use and may be marketed in ways that put the public health at risk. The FDA has received adverse event reports involving delta-8 THC-containing products. Delta-8 THC has psychoactive and intoxicating effects. Delta-8 THC manufacturing often involve use of potentially harmful chemicals to create the concentrations of delta-8 THC claimed in the marketplace. The final delta-8 THC product may have potentially harmful by-products (contaminants) due to the chemicals used in the process. Manufacturing of delta-8 THC products may occur in uncontrolled or unsanitary settings, which may lead to the presence of unsafe contaminants or other potentially harmful substances. Delta-8 THC products should be kept out of the reach of children and pets.  MORE ABOUT CBD  General Information: CBD was discovered in [birth year] and it is a derivative of the cannabis sativa genus plants (Marijuana and Hemp). It is one of the 113 identified substances found in Marijuana. It accounts for up to 40% of  the plant's extract. As of 2018, preliminary clinical studies on CBD included research for the treatment of anxiety, movement disorders, and pain. CBD is available and consumed in multiple forms, including inhalation of smoke or vapor, as an aerosol spray, and by mouth. It may be supplied as an oil containing CBD, capsules, dried cannabis, or as a liquid solution. CBD is thought not to be as psychoactive as THC (delta-9-tetrahydrocannabinol - the chemical in marijuana responsible for the "HIGH"). Studies suggest that CBD may interact with different biological target receptors in the body, including cannabinoid and other neurotransmitter receptors. As of 2018 the mechanism of action for its biological effects has not been determined.  Side-effects  Adverse reactions: Dry mouth, diarrhea, decreased appetite, fatigue, drowsiness, malaise, weakness, sleep disturbances, and others.  Drug interactions: CBC may interact with other medications such as blood-thinners. (Last update: 10/28/2020) ____________________________________________________________________________________________  ____________________________________________________________________________________________  Pain Prevention Technique  Definition:   A technique used to minimize the effects of an activity known to cause inflammation or swelling, which in turn leads to an increase in pain.  Purpose: To prevent swelling from occurring. It is based on the fact that it is easier to prevent swelling from happening than it is to get rid of it, once it occurs.  Contraindications: Anyone with allergy or hypersensitivity to the recommended medications. Anyone taking anticoagulants (Blood Thinners) (e.g., Coumadin, Warfarin, Plavix, etc.). Patients in Renal Failure.  Technique: Before you undertake an activity known to cause pain, or a flare-up of your chronic pain, and before you experience any pain, do the following:  On a full stomach,  take 4 (four) over the counter Ibuprofens 200mg  tablets (Motrin), for a total of 800 mg. In addition, take over the counter Magnesium 400 to 500 mg, before doing the activity.  Six (6) hours later, again on a full stomach, repeat the Ibuprofen. That night, take a warm shower and stretch under the running warm water.  This technique may be sufficient to abort the pain and discomfort before it happens. Keep in  mind that it takes a lot less medication to prevent swelling than it takes to eliminate it once it occurs.  ____________________________________________________________________________________________  ______________________________________________________________________  Preparing for Procedure with Sedation  NOTICE: Due to recent regulatory changes, starting on August 29, 2020, procedures requiring intravenous (IV) sedation will no longer be performed at the [facility].  These types of procedures are required to be performed at [facility] ambulatory surgery facility.  We are very sorry for the inconvenience.  Procedure appointments are limited to planned procedures: No Prescription Refills. No disability issues will be discussed. No medication changes will be discussed.  Instructions: Oral Intake: Do not eat or drink anything for at least 8 hours prior to your procedure. (Exception: Blood Pressure Medication. See below.) Transportation: A driver is required. You may not drive yourself after the procedure. Blood Pressure Medicine: Do not forget to take your blood pressure medicine with a sip of water the morning of the procedure. If your Diastolic (lower reading) is above 100 mmHg, elective cases will be cancelled/rescheduled. Blood thinners: These will need to be stopped for procedures. Notify our staff if you are taking any blood thinners. Depending on which one you take, there will be specific instructions on how and when to stop it. Diabetics on insulin: Notify the staff so that  you can be scheduled 1st case in the morning. If your diabetes requires high dose insulin, take only  of your normal insulin dose the morning of the procedure and notify the staff that you have done so. Preventing infections: Shower with an antibacterial soap the morning of your procedure. Build-up your immune system: Take 1000 mg of Vitamin C with every meal (3 times a day) the day prior to your procedure. Antibiotics: Inform the staff if you have a condition or reason that requires you to take antibiotics before dental procedures. Pregnancy: If you are pregnant, call and cancel the procedure. Sickness: If you have a cold, fever, or any active infections, call and cancel the procedure. Arrival: You must be in the facility at least 30 minutes prior to your scheduled procedure. Children: Do not bring children with you. Dress appropriately: Bring dark clothing that you would not mind if they get stained. Valuables: Do not bring any jewelry or valuables.  Reasons to call and reschedule or cancel your procedure: (Following these recommendations will minimize the risk of a serious complication.) Surgeries: Avoid having procedures within 2 weeks of any surgery. (Avoid for 2 weeks before or after any surgery). Flu Shots: Avoid having procedures within 2 weeks of a flu shots. (Avoid for 2 weeks before or after immunizations). Barium: Avoid having a procedure within 7-10 days after having had a radiological study involving the use of radiological contrast. (Myelograms, Barium swallow or enema study). Heart attacks: Avoid any elective procedures or surgeries for the initial 6 months after a "Myocardial Infarction" (Heart Attack). Blood thinners: It is imperative that you stop these medications before procedures. Let us know if you if you take any blood thinner.  Infection: Avoid procedures during or within two weeks of an infection (including chest colds or gastrointestinal problems). Symptoms associated  with infections include: Localized redness, fever, chills, night sweats or profuse sweating, burning sensation when voiding, cough, congestion, stuffiness, runny nose, sore throat, diarrhea, nausea, vomiting, cold or Flu symptoms, recent or current infections. It is specially important if the infection is over the area that we intend to treat. Heart and lung problems: Symptoms that may suggest an active cardiopulmonary problem include:  cough, chest pain, breathing difficulties or shortness of breath, dizziness, ankle swelling, uncontrolled high or unusually low blood pressure, and/or palpitations. If you are experiencing any of these symptoms, cancel your procedure and contact your primary care physician for an evaluation.  Remember:  Regular Business hours are:  Monday to Thursday 8:00 AM to 4:00 PM  Provider's Schedule: Delano Metz, MD:  Procedure days: Tuesday and Thursday 7:30 AM to 4:00 PM  Edward Jolly, MD:  Procedure days: Monday and Wednesday 7:30 AM to 4:00 PM ______________________________________________________________________  ____________________________________________________________________________________________  General Risks and Possible Complications  Patient Responsibilities: It is important that you read this as it is part of your informed consent. It is our duty to inform you of the risks and possible complications associated with treatments offered to you. It is your responsibility as a patient to read this and to ask questions about anything that is not clear or that you believe was not covered in this document.  Patient's Rights: You have the right to refuse treatment. You also have the right to change your mind, even after initially having agreed to have the treatment done. However, under this last option, if you wait until the last second to change your mind, you may be charged for the materials used up to that point.  Introduction: Medicine is not an Doctor, general practice. Everything in Medicine, including the lack of treatment(s), carries the potential for danger, harm, or loss (which is by definition: Risk). In Medicine, a complication is a secondary problem, condition, or disease that can aggravate an already existing one. All treatments carry the risk of possible complications. The fact that a side effects or complications occurs, does not imply that the treatment was conducted incorrectly. It must be clearly understood that these can happen even when everything is done following the highest safety standards.  No treatment: You can choose not to proceed with the proposed treatment alternative. The "PRO(s)" would include: avoiding the risk of complications associated with the therapy. The "CON(s)" would include: not getting any of the treatment benefits. These benefits fall under one of three categories: diagnostic; therapeutic; and/or palliative. Diagnostic benefits include: getting information which can ultimately lead to improvement of the disease or symptom(s). Therapeutic benefits are those associated with the successful treatment of the disease. Finally, palliative benefits are those related to the decrease of the primary symptoms, without necessarily curing the condition (example: decreasing the pain from a flare-up of a chronic condition, such as incurable terminal cancer).  General Risks and Complications: These are associated to most interventional treatments. They can occur alone, or in combination. They fall under one of the following six (6) categories: no benefit or worsening of symptoms; bleeding; infection; nerve damage; allergic reactions; and/or death. No benefits or worsening of symptoms: In Medicine there are no guarantees, only probabilities. No healthcare provider can ever guarantee that a medical treatment will work, they can only state the probability that it may. Furthermore, there is always the possibility that the condition may worsen,  either directly, or indirectly, as a consequence of the treatment. Bleeding: This is more common if the patient is taking a blood thinner, either prescription or over the counter (example: Goody Powders, Fish oil, Aspirin, Garlic, etc.), or if suffering a condition associated with impaired coagulation (example: Hemophilia, cirrhosis of the liver, low platelet counts, etc.). However, even if you do not have one on these, it can still happen. If you have any of these conditions, or take one of these drugs,  make sure to notify your treating physician. Infection: This is more common in patients with a compromised immune system, either due to disease (example: diabetes, cancer, human immunodeficiency virus [HIV], etc.), or due to medications or treatments (example: therapies used to treat cancer and rheumatological diseases). However, even if you do not have one on these, it can still happen. If you have any of these conditions, or take one of these drugs, make sure to notify your treating physician. Nerve Damage: This is more common when the treatment is an invasive one, but it can also happen with the use of medications, such as those used in the treatment of cancer. The damage can occur to small secondary nerves, or to large primary ones, such as those in the spinal cord and brain. This damage may be temporary or permanent and it may lead to impairments that can range from temporary numbness to permanent paralysis and/or brain death. Allergic Reactions: Any time a substance or material comes in contact with our body, there is the possibility of an allergic reaction. These can range from a mild skin rash (contact dermatitis) to a severe systemic reaction (anaphylactic reaction), which can result in death. Death: In general, any medical intervention can result in death, most of the time due to an unforeseen complication. ____________________________________________________________________________________________

## 2020-11-29 ENCOUNTER — Telehealth: Payer: Self-pay

## 2020-11-29 NOTE — Telephone Encounter (Signed)
When she is hurting a lot the cbc drops do not help, she is not sleeping much. She needs something to take the edge off of her pain.

## 2020-11-29 NOTE — Telephone Encounter (Signed)
Offered to ask Dr. Cherylann Ratel about Toradol/Norflex injection, as Dr. Laban Emperor is out of the office this week. Patient states she is actually not hurting as bad today. Will call us back if needed.

## 2020-12-01 ENCOUNTER — Telehealth: Payer: Self-pay | Admitting: Pain Medicine

## 2020-12-01 ENCOUNTER — Ambulatory Visit
Payer: Medicare HMO | Attending: Student in an Organized Health Care Education/Training Program | Admitting: Student in an Organized Health Care Education/Training Program

## 2020-12-01 ENCOUNTER — Other Ambulatory Visit: Payer: Self-pay

## 2020-12-01 ENCOUNTER — Encounter: Payer: Self-pay | Admitting: Student in an Organized Health Care Education/Training Program

## 2020-12-01 VITALS — BP 115/87 | HR 94 | Temp 97.4°F | Resp 16 | Ht 62.0 in | Wt 131.0 lb

## 2020-12-01 DIAGNOSIS — M5137 Other intervertebral disc degeneration, lumbosacral region: Secondary | ICD-10-CM | POA: Insufficient documentation

## 2020-12-01 DIAGNOSIS — G8929 Other chronic pain: Secondary | ICD-10-CM | POA: Insufficient documentation

## 2020-12-01 DIAGNOSIS — M47816 Spondylosis without myelopathy or radiculopathy, lumbar region: Secondary | ICD-10-CM | POA: Insufficient documentation

## 2020-12-01 DIAGNOSIS — M47817 Spondylosis without myelopathy or radiculopathy, lumbosacral region: Secondary | ICD-10-CM | POA: Diagnosis not present

## 2020-12-01 DIAGNOSIS — M545 Low back pain, unspecified: Secondary | ICD-10-CM | POA: Diagnosis not present

## 2020-12-01 MED ORDER — ORPHENADRINE CITRATE 30 MG/ML IJ SOLN
30.0000 mg | Freq: Once | INTRAMUSCULAR | Status: AC
  Administered 2020-12-01: 30 mg via INTRAMUSCULAR
  Filled 2020-12-01: qty 2

## 2020-12-01 MED ORDER — KETOROLAC TROMETHAMINE 30 MG/ML IJ SOLN
30.0000 mg | Freq: Once | INTRAMUSCULAR | Status: AC
  Administered 2020-12-01: 30 mg via INTRAMUSCULAR
  Filled 2020-12-01: qty 1

## 2020-12-01 NOTE — Patient Instructions (Addendum)
No Mobic for next 5 - 7 days.   ______________________________________________________________________  Preparing for Procedure with Sedation  NOTICE: Due to recent regulatory changes, starting on August 29, 2020, procedures requiring intravenous (IV) sedation will no longer be performed at the [facility].  These types of procedures are required to be performed at [facility] ambulatory surgery facility.  We are very sorry for the inconvenience.  Procedure appointments are limited to planned procedures: No Prescription Refills. No disability issues will be discussed. No medication changes will be discussed.  Instructions: Oral Intake: Do not eat or drink anything for at least 8 hours prior to your procedure. (Exception: Blood Pressure Medication. See below.) Transportation: A driver is required. You may not drive yourself after the procedure. Blood Pressure Medicine: Do not forget to take your blood pressure medicine with a sip of water the morning of the procedure. If your Diastolic (lower reading) is above 100 mmHg, elective cases will be cancelled/rescheduled. Blood thinners: These will need to be stopped for procedures. Notify our staff if you are taking any blood thinners. Depending on which one you take, there will be specific instructions on how and when to stop it. Diabetics on insulin: Notify the staff so that you can be scheduled 1st case in the morning. If your diabetes requires high dose insulin, take only  of your normal insulin dose the morning of the procedure and notify the staff that you have done so. Preventing infections: Shower with an antibacterial soap the morning of your procedure. Build-up your immune system: Take 1000 mg of Vitamin C with every meal (3 times a day) the day prior to your procedure. Antibiotics: Inform the staff if you have a condition or reason that requires you to take antibiotics before dental procedures. Pregnancy: If you are pregnant, call and  cancel the procedure. Sickness: If you have a cold, fever, or any active infections, call and cancel the procedure. Arrival: You must be in the facility at least 30 minutes prior to your scheduled procedure. Children: Do not bring children with you. Dress appropriately: Bring dark clothing that you would not mind if they get stained. Valuables: Do not bring any jewelry or valuables.  Reasons to call and reschedule or cancel your procedure: (Following these recommendations will minimize the risk of a serious complication.) Surgeries: Avoid having procedures within 2 weeks of any surgery. (Avoid for 2 weeks before or after any surgery). Flu Shots: Avoid having procedures within 2 weeks of a flu shots. (Avoid for 2 weeks before or after immunizations). Barium: Avoid having a procedure within 7-10 days after having had a radiological study involving the use of radiological contrast. (Myelograms, Barium swallow or enema study). Heart attacks: Avoid any elective procedures or surgeries for the initial 6 months after a "Myocardial Infarction" (Heart Attack). Blood thinners: It is imperative that you stop these medications before procedures. Let us know if you if you take any blood thinner.  Infection: Avoid procedures during or within two weeks of an infection (including chest colds or gastrointestinal problems). Symptoms associated with infections include: Localized redness, fever, chills, night sweats or profuse sweating, burning sensation when voiding, cough, congestion, stuffiness, runny nose, sore throat, diarrhea, nausea, vomiting, cold or Flu symptoms, recent or current infections. It is specially important if the infection is over the area that we intend to treat. Heart and lung problems: Symptoms that may suggest an active cardiopulmonary problem include: cough, chest pain, breathing difficulties or shortness of breath, dizziness, ankle swelling, uncontrolled high  or unusually low blood pressure,  and/or palpitations. If you are experiencing any of these symptoms, cancel your procedure and contact your primary care physician for an evaluation.  Remember:  Regular Business hours are:  Monday to Thursday 8:00 AM to 4:00 PM  Provider's Schedule: Delano Metz, MD:  Procedure days: Tuesday and Thursday 7:30 AM to 4:00 PM  Edward Jolly, MD:  Procedure days: Monday and Wednesday 7:30 AM to 4:00 PM ______________________________________________________________________  Facet Blocks Patient Information  Description: The facets are joints in the spine between the vertebrae.  Like any joints in the body, facets can become irritated and painful.  Arthritis can also effect the facets.  By injecting steroids and local anesthetic in and around these joints, we can temporarily block the nerve supply to them.  Steroids act directly on irritated nerves and tissues to reduce selling and inflammation which often leads to decreased pain.  Facet blocks may be done anywhere along the spine from the neck to the low back depending upon the location of your pain.   After numbing the skin with local anesthetic (like Novocaine), a small needle is passed onto the facet joints under x-ray guidance.  You may experience a sensation of pressure while this is being done.  The entire block usually lasts about 15-25 minutes.   Conditions which may be treated by facet blocks:  Low back/buttock pain Neck/shoulder pain Certain types of headaches  Preparation for the injection:  Do not eat any solid food or dairy products within 8 hours of your appointment. You may drink clear liquid up to 3 hours before appointment.  Clear liquids include water, black coffee, juice or soda.  No milk or cream please. You may take your regular medication, including pain medications, with a sip of water before your appointment.  Diabetics should hold regular insulin (if taken separately) and take 1/2 normal NPH dose the morning of  the procedure.  Carry some sugar containing items with you to your appointment. A driver must accompany you and be prepared to drive you home after your procedure. Bring all your current medications with you. An IV may be inserted and sedation may be given at the discretion of the physician. A blood pressure cuff, EKG and other monitors will often be applied during the procedure.  Some patients may need to have extra oxygen administered for a short period. You will be asked to provide medical information, including your allergies and medications, prior to the procedure.  We must know immediately if you are taking blood thinners (like Coumadin/Warfarin) or if you are allergic to IV iodine contrast (dye).  We must know if you could possible be pregnant.  Possible side-effects:  Bleeding from needle site Infection (rare, may require surgery) Nerve injury (rare) Numbness & tingling (temporary) Difficulty urinating (rare, temporary) Spinal headache (a headache worse with upright posture) Light-headedness (temporary) Pain at injection site (serveral days) Decreased blood pressure (rare, temporary) Weakness in arm/leg (temporary) Pressure sensation in back/neck (temporary)   Call if you experience:  Fever/chills associated with headache or increased back/neck pain Headache worsened by an upright position New onset, weakness or numbness of an extremity below the injection site Hives or difficulty breathing (go to the emergency room) Inflammation or drainage at the injection site(s) Severe back/neck pain greater than usual New symptoms which are concerning to you  Please note:  Although the local anesthetic injected can often make your back or neck feel good for several hours after the injection, the pain  will likely return. It takes 3-7 days for steroids to work.  You may not notice any pain relief for at least one week.  If effective, we will often do a series of 2-3 injections spaced  3-6 weeks apart to maximally decrease your pain.  After the initial series, you may be a candidate for a more permanent nerve block of the facets.  If you have any questions, please call #[phone] [facility]

## 2020-12-01 NOTE — Progress Notes (Signed)
Safety precautions to be maintained throughout the outpatient stay will include: orient to surroundings, keep bed in low position, maintain call bell within reach at all times, provide assistance with transfer out of bed and ambulation.  

## 2020-12-01 NOTE — Progress Notes (Signed)
PROVIDER NOTE: Information contained herein reflects review and annotations entered in association with encounter. Interpretation of such information and data should be left to medically-trained personnel. Information provided to patient can be located elsewhere in the medical record under "Patient Instructions". Document created using STT-dictation technology, any transcriptional errors that may result from process are unintentional.    Patient: Angela Espinoza  Service Category: E/M  Provider: Gillis Santa, MD  DOB: [birth year]  DOS: 12/01/2020  Specialty: Interventional Pain Management  MRN: [id number]  Setting: Ambulatory outpatient  PCP: Ane Payment, PA  Type: Established Patient    Referring Provider: Marylen Ponto*  Location: Office  Delivery: Face-to-face     HPI  Ms. Angela Espinoza, a 65 y.o. year old female, has Allergy; Anxiety; Asthma without status asthmaticus; Depression; Hypertension; Postmenopause; Chronic low back pain (1ry area of Pain) (Left) w/o sciatica; Chronic pain syndrome; Pharmacologic therapy; Disorder of skeletal system; Problems influencing health status; History of L1 kyphoplasty; Traumatic compression fracture of L1 lumbar vertebra, sequela (2016); Spondylosis without myelopathy or radiculopathy, lumbar region; Lumbar facet syndrome (Left); DDD (degenerative disc disease), lumbosacral; Thoracic facet syndrome (Left); Spondylosis without myelopathy or radiculopathy, lumbosacral region; Spondylosis without myelopathy or radiculopathy, thoracic region; DDD (degenerative disc disease), thoracolumbar; Acute postoperative pain; Neurogenic pain; Acid reflux; and Abnormal mammogram of left breast on their problem list. She is here today because of her Chronic left-sided low back pain without sciatica [M54.50, G89.29]. Ms. Angela Espinoza primary complain today is low back pain related to lumbar facet arthropathy lumbar spondylosis  Pain Assessment:  Severity of Chronic pain is reported as a 8 /10. Location: Back Lower, Left/denies. Onset: More than a month ago. Quality: Discomfort, Constant, Aching, Nagging. Timing: Constant. Modifying factor(s): heat and propping feet up.. Vitals:  height is _0  (1.575 m) and weight is 131 lb (59.4 kg). Her temporal temperature is 97.4 F (36.3 C) (abnormal). Her blood pressure is 115/87 and her pulse is 94. Her respiration is 16 and oxygen saturation is 100%.   Reason for encounter: worsening of previously known (established) problem  Angela Espinoza is a patient of my colleague, Dr. Dossie Arbour who is currently out of clinic.  She is having increased lower lumbar spine pain related to lumbar spondylosis and lumbar degenerative disc disease.  She is in the process of having repeat lumbar facet medial branch nerve blocks and possibly repeating her lumbar radiofrequency ablation which responded well to in June of last year.  She presents today for intramuscular Norflex and Toradol given acute on chronic pain flare.  I instructed her to refrain from meloxicam for the next 5 days after Toradol administration.    ROS  Constitutional: Denies any fever or chills Gastrointestinal: No reported hemesis, hematochezia, vomiting, or acute GI distress Musculoskeletal:  Lowback pain Neurological: No reported episodes of acute onset apraxia, aphasia, dysarthria, agnosia, amnesia, paralysis, loss of coordination, or loss of consciousness  Medication Review  Biotin, Black Elderberry, Black Pepper-Turmeric, Calcium Carb-Cholecalciferol, DULoxetine, Omega-3 Fatty Acids, acetaminophen, gabapentin, losartan, meloxicam, multivitamin, and vitamin C  History Review  Allergy: Angela Espinoza is allergic to indapamide. Drug: Angela Espinoza  reports no history of drug use. Alcohol:  reports current alcohol use. Tobacco:  reports that she has never smoked. She has been exposed to tobacco smoke. She has never used smokeless tobacco. Social: Ms.  Espinoza  reports that she has never smoked. She has been exposed to tobacco smoke. She has never used smokeless tobacco. She reports current alcohol use. She reports  that she does not use drugs. Medical:  has a past medical history of Anxiety, Asthma, Depression, Hyperlipidemia, Hypertension, and IBS (irritable bowel syndrome). Surgical: Angela Espinoza  has a past surgical history that includes Oophorectomy; Exploratory laparotomy; Foot surgery; Colonoscopy; Tonsillectomy; Axillary lymph node biopsy (Right, 08/03/2014); Kyphoplasty (N/A, 12/10/2014); Abdominal hysterectomy (age 65); Breast biopsy (Left, 10/07/2018); and Breast biopsy (Left, 10/21/2019). Family: family history includes Heart attack in her mother; Lung cancer in her father.  Laboratory Chemistry Profile   Renal Lab Results  Component Value Date   BUN 10 01/01/2018   CREATININE 0.88 01/01/2018   BCR 11 (L) 01/01/2018   GFRAA 81 01/01/2018   GFRNONAA 70 01/01/2018    Hepatic Lab Results  Component Value Date   AST 26 01/01/2018   ALT 42 08/10/2011   ALBUMIN 4.6 01/01/2018   ALKPHOS 73 01/01/2018    Electrolytes Lab Results  Component Value Date   NA 136 01/01/2018   K 4.5 01/01/2018   CL 98 01/01/2018   CALCIUM 9.9 01/01/2018   MG 2.3 12/10/2017   PHOS 1.6 (L) 08/10/2011    Bone Lab Results  Component Value Date   25OHVITD1 40 01/01/2018   25OHVITD2 <1.0 01/01/2018   25OHVITD3 40 01/01/2018    Inflammation (CRP: Acute Phase) (ESR: Chronic Phase) Lab Results  Component Value Date   CRP 1 12/10/2017   ESRSEDRATE 3 12/10/2017         Note: Above Lab results reviewed.  Recent Imaging Review  DG Lumbar Spine Complete W/Bend CLINICAL DATA:  Low back pain  EXAM: LUMBAR SPINE - COMPLETE WITH BENDING VIEWS  COMPARISON:  X-ray lumbar 12/10/2017  FINDINGS: Markedly limited evaluation due to overlapping osseous structures and overlying soft tissues.  Five non-rib-bearing lumbar vertebral  bodies.  Compression fracture status post kyphoplasty of the L1 vertebral body. There is no evidence of lumbar spine fracture. Alignment is normal. No change in alignment on flexion and extension views. Intervertebral disc spaces are maintained.  IMPRESSION: No acute displaced fracture or traumatic listhesis of the lumbar spine.  Electronically Signed   By: Iven Finn M.D.   On: 11/28/2020 19:52 Note: Reviewed        Physical Exam  General appearance: Well nourished, well developed, and well hydrated. In no apparent acute distress Mental status: Alert, oriented x 3 (person, place, & time)       Respiratory: No evidence of acute respiratory distress Eyes: PERLA Vitals: BP 115/87 (BP Location: Right Arm, Patient Position: Sitting, Cuff Size: Normal)   Pulse 94   Temp (!) 97.4 F (36.3 C) (Temporal)   Resp 16   Ht _0  (1.575 m)   Wt 131 lb (59.4 kg)   SpO2 100%   BMI 23.96 kg/m  BMI: Estimated body mass index is 23.96 kg/m as calculated from the following:   Height as of this encounter: _1  (1.575 m).   Weight as of this encounter: 131 lb (59.4 kg). Ideal: Ideal body weight: 50.1 kg (110 lb 7.2 oz) Adjusted ideal body weight: 53.8 kg (118 lb 10.7 oz)  Lumbar Spine Area Exam  Skin & Axial Inspection: No masses, redness, or swelling Alignment: Symmetrical Functional ROM: Pain restricted ROM       Stability: No instability detected Muscle Tone/Strength: Functionally intact. No obvious neuro-muscular anomalies detected. Sensory (Neurological): Musculoskeletal pain pattern  Gait & Posture Assessment  Ambulation: Unassisted Gait: Relatively normal for age and body habitus Posture: WNL   5 out of 5 strength bilateral  lower extremity: Plantar flexion, dorsiflexion, knee flexion, knee extension.   Assessment   Status Diagnosis  Having a Flare-up Persistent Persistent 1. Chronic low back pain (1ry area of Pain) (Left) w/o sciatica   2. DDD (degenerative disc  disease), lumbosacral   3. Lumbar facet syndrome (Left)   4. Spondylosis without myelopathy or radiculopathy, lumbosacral region        Plan of Care    Ms. Crystalynn Mcinerney Fujiwara has a current medication list which includes the following long-term medication(s): calcium carb-cholecalciferol and losartan.  Pharmacotherapy (Medications Ordered): Meds ordered this encounter  Medications   ketorolac (TORADOL) 30 MG/ML injection 30 mg   orphenadrine (NORFLEX) injection 30 mg   Refrain from meloxicam for next 5 days.  Follow-up plan:   Return for Keep sch. appt.        Recent Visits Date Type Provider Dept  11/28/20 Office Visit Milinda Pointer, MD [facility]  Showing recent visits within past 90 days and meeting all other requirements Today's Visits Date Type Provider Dept  12/01/20 Procedure visit Gillis Santa, MD [facility]  Showing today's visits and meeting all other requirements Future Appointments Date Type Provider Dept  12/27/20 Appointment Milinda Pointer, MD [facility]  Showing future appointments within next 90 days and meeting all other requirements I discussed the assessment and treatment plan with the patient. The patient was provided an opportunity to ask questions and all were answered. The patient agreed with the plan and demonstrated an understanding of the instructions.  Patient advised to call back or seek an in-person evaluation if the symptoms or condition worsens.  Duration of encounter: 78mnutes.  Note by: BGillis Santa MD Date: 12/01/2020; Time: 11:43 AM

## 2020-12-07 ENCOUNTER — Ambulatory Visit
Admission: RE | Admit: 2020-12-07 | Discharge: 2020-12-07 | Disposition: A | Discharge status: Home / Self Care | Payer: Medicare HMO | Source: Ambulatory Visit | Attending: Physician Assistant | Admitting: Physician Assistant

## 2020-12-07 ENCOUNTER — Other Ambulatory Visit: Payer: Self-pay

## 2020-12-07 DIAGNOSIS — Z1231 Encounter for screening mammogram for malignant neoplasm of breast: Secondary | ICD-10-CM | POA: Diagnosis not present

## 2020-12-26 NOTE — Progress Notes (Signed)
PROVIDER NOTE: Information contained herein reflects review and annotations entered in association with encounter. Interpretation of such information and data should be left to medically-trained personnel. Information provided to patient can be located elsewhere in the medical record under "Patient Instructions". Document created using STT-dictation technology, any transcriptional errors that may result from process are unintentional.    Patient: Angela Espinoza  Service Category: Procedure Provider: Gaspar Cola, MD DOB: [birth year] DOS: 12/27/2020 Location: [facility] MRN: [id number] Setting: Ambulatory - outpatient Referring Provider: Milinda Pointer, MD Type: Established Patient Specialty: Interventional Pain Management PCP: Ane Payment, [place]  Primary Reason for Visit: Interventional Pain Management Treatment. CC: Back Pain   Procedure:          Type: Lumbar Facet, Medial Branch Block(s)          Primary Purpose: Diagnostic/Therapeutic Region: Posterolateral Lumbosacral Spine Level: L4, L5, & S1 Medial Branch Level(s). Injecting these levels blocks the L4-5, and L5-S1 lumbar facet joints. Laterality: Left Anesthesia: Local (1-2% Lidocaine)  Anxiolysis: None  Sedation: None  Guidance: Fluoroscopy          Indications: 1. Lumbar facet syndrome (Left)   2. Spondylosis without myelopathy or radiculopathy, lumbosacral region   3. DDD (degenerative disc disease), lumbosacral   4. Chronic low back pain (1ry area of Pain) (Left) w/o sciatica   5. Other intervertebral disc degeneration, lumbar region    Pain Score: Pre-procedure: 5 /10 Post-procedure: 0-No pain/10    Position: Prone  Pre-op H&P Assessment:  Ms. Angela Espinoza is a 65 y.o. (year old), female patient, seen today for interventional treatment. She  has a past surgical history that includes Oophorectomy; Exploratory laparotomy; Foot surgery; Colonoscopy; Tonsillectomy; Axillary lymph  node biopsy (Right, 08/03/2014); Kyphoplasty (N/A, 12/10/2014); Abdominal hysterectomy (age 79); Breast biopsy (Left, 10/07/2018); and Breast biopsy (Left, 10/21/2019). [name] has a current medication list which includes the following prescription(s): acetaminophen, biotin, black elderberry, black pepper-turmeric, calcium carb-cholecalciferol, duloxetine, gabapentin, losartan, meloxicam, multivitamin, omega-3 fatty acids, and vitamin c. Her primarily concern today is the Back Pain  Initial Vital Signs:  Pulse/HCG Rate: (!) 108ECG Heart Rate: 98 Temp: (!) 96.6 F (35.9 C) Resp: 16 BP: 127/73 SpO2: 100 %  BMI: Estimated body mass index is 23.78 kg/m as calculated from the following:   Height as of this encounter: 5\' 2"  (1.575 m).   Weight as of this encounter: 130 lb (59 kg).  Risk Assessment: Allergies: Reviewed. She is allergic to indapamide.  Allergy Precautions: None required Coagulopathies: Reviewed. None identified.  Blood-thinner therapy: None at this time Active Infection(s): Reviewed. None identified. Ms. Angela Espinoza is afebrile  Site Confirmation: Ms. Angela Espinoza was asked to confirm the procedure and laterality before marking the site Procedure checklist: Completed Consent: Before the procedure and under the influence of no sedative(s), amnesic(s), or anxiolytics, the patient was informed of the treatment options, risks and possible complications. To fulfill our ethical and legal obligations, as recommended by the American Medical Association's Code of Ethics, I have informed the patient of my clinical impression; the nature and purpose of the treatment or procedure; the risks, benefits, and possible complications of the intervention; the alternatives, including doing nothing; the risk(s) and benefit(s) of the alternative treatment(s) or procedure(s); and the risk(s) and benefit(s) of doing nothing. The patient was provided information about the general risks and possible  complications associated with the procedure. These may include, but are not limited to: failure to achieve desired goals, infection, bleeding, organ or nerve damage, allergic  reactions, paralysis, and death. In addition, the patient was informed of those risks and complications associated to Spine-related procedures, such as failure to decrease pain; infection (i.e.: Meningitis, epidural or intraspinal abscess); bleeding (i.e.: epidural hematoma, subarachnoid hemorrhage, or any other type of intraspinal or peri-dural bleeding); organ or nerve damage (i.e.: Any type of peripheral nerve, nerve root, or spinal cord injury) with subsequent damage to sensory, motor, and/or autonomic systems, resulting in permanent pain, numbness, and/or weakness of one or several areas of the body; allergic reactions; (i.e.: anaphylactic reaction); and/or death. Furthermore, the patient was informed of those risks and complications associated with the medications. These include, but are not limited to: allergic reactions (i.e.: anaphylactic or anaphylactoid reaction(s)); adrenal axis suppression; blood sugar elevation that in diabetics may result in ketoacidosis or comma; water retention that in patients with history of congestive heart failure may result in shortness of breath, pulmonary edema, and decompensation with resultant heart failure; weight gain; swelling or edema; medication-induced neural toxicity; particulate matter embolism and blood vessel occlusion with resultant organ, and/or nervous system infarction; and/or aseptic necrosis of one or more joints. Finally, the patient was informed that Medicine is not an exact science; therefore, there is also the possibility of unforeseen or unpredictable risks and/or possible complications that may result in a catastrophic outcome. The patient indicated having understood very clearly. We have given the patient no guarantees and we have made no promises. Enough time was given to the  patient to ask questions, all of which were answered to the patient's satisfaction. Ms. Angela Espinoza has indicated that she wanted to continue with the procedure. Attestation: I, the ordering provider, attest that I have discussed with the patient the benefits, risks, side-effects, alternatives, likelihood of achieving goals, and potential problems during recovery for the procedure that I have provided informed consent. Date  Time: 12/27/2020  8:56 AM  Pre-Procedure Preparation:  Monitoring: As per clinic protocol. Respiration, ETCO2, SpO2, BP, heart rate and rhythm monitor placed and checked for adequate function Safety Precautions: Patient was assessed for positional comfort and pressure points before starting the procedure. Time-out: I initiated and conducted the "Time-out" before starting the procedure, as per protocol. The patient was asked to participate by confirming the accuracy of the "Time Out" information. Verification of the correct person, site, and procedure were performed and confirmed by me, the nursing staff, and the patient. "Time-out" conducted as per Joint Commission's Universal Protocol (UP.01.01.01). Time: 0929  Description of Procedure:          Laterality: Left Levels:  L4, L5, & S1 Medial Branch Level(s) Area Prepped: Posterior Lumbosacral Region DuraPrep (Iodine Povacrylex [0.7% available iodine] and Isopropyl Alcohol, 74% w/w) Safety Precautions: Aspiration looking for blood return was conducted prior to all injections. At no point did we inject any substances, as a needle was being advanced. Before injecting, the patient was told to immediately notify me if she was experiencing any new onset of "ringing in the ears, or metallic taste in the mouth". No attempts were made at seeking any paresthesias. Safe injection practices and needle disposal techniques used. Medications properly checked for expiration dates. SDV (single dose vial) medications used. After the completion of  the procedure, all disposable equipment used was discarded in the proper designated medical waste containers. Local Anesthesia: Protocol guidelines were followed. The patient was positioned over the fluoroscopy table. The area was prepped in the usual manner. The time-out was completed. The target area was identified using fluoroscopy. A 12-in long, straight, sterile  hemostat was used with fluoroscopic guidance to locate the targets for each level blocked. Once located, the skin was marked with an approved surgical skin marker. Once all sites were marked, the skin (epidermis, dermis, and hypodermis), as well as deeper tissues (fat, connective tissue and muscle) were infiltrated with a small amount of a short-acting local anesthetic, loaded on a 10cc syringe with a 25G, 1.5-in  Needle. An appropriate amount of time was allowed for local anesthetics to take effect before proceeding to the next step. Local Anesthetic: Lidocaine 2.0% The unused portion of the local anesthetic was discarded in the proper designated containers. Technical explanation of process:  L4 Medial Branch Nerve Block (MBB): The target area for the L4 medial branch is at the junction of the postero-lateral aspect of the superior articular process and the superior, posterior, and medial edge of the transverse process of L5. Under fluoroscopic guidance, a Quincke needle was inserted until contact was made with os over the superior postero-lateral aspect of the pedicular shadow (target area). After negative aspiration for blood, 0.5 mL of the nerve block solution was injected without difficulty or complication. The needle was removed intact. L5 Medial Branch Nerve Block (MBB): The target area for the L5 medial branch is at the junction of the postero-lateral aspect of the superior articular process and the superior, posterior, and medial edge of the sacral ala. Under fluoroscopic guidance, a Quincke needle was inserted until contact was made with  os over the superior postero-lateral aspect of the pedicular shadow (target area). After negative aspiration for blood, 0.5 mL of the nerve block solution was injected without difficulty or complication. The needle was removed intact. S1 Medial Branch Nerve Block (MBB): The target area for the S1 medial branch is at the posterior and inferior 6 o'clock position of the L5-S1 facet joint. Under fluoroscopic guidance, the Quincke needle inserted for the L5 MBB was redirected until contact was made with os over the inferior and postero aspect of the sacrum, at the 6 o' clock position under the L5-S1 facet joint (Target area). After negative aspiration for blood, 0.5 mL of the nerve block solution was injected without difficulty or complication. The needle was removed intact.  Nerve block solution: 0.2% PF-Ropivacaine + Triamcinolone (40 mg/mL) diluted to a final concentration of 4 mg of Triamcinolone/mL of Ropivacaine The unused portion of the solution was discarded in the proper designated containers. Procedural Needles: 22-gauge, 3.5-inch, Quincke needles used for all levels.  Once the entire procedure was completed, the treated area was cleaned, making sure to leave some of the prepping solution back to take advantage of its long term bactericidal properties.      Illustration of the posterior view of the lumbar spine and the posterior neural structures. Laminae of L2 through S1 are labeled. DPRL5, dorsal primary ramus of L5; DPRS1, dorsal primary ramus of S1; DPR3, dorsal primary ramus of L3; FJ, facet (zygapophyseal) joint L3-L4; I, inferior articular process of L4; LB1, lateral branch of dorsal primary ramus of L1; IAB, inferior articular branches from L3 medial branch (supplies L4-L5 facet joint); IBP, intermediate branch plexus; MB3, medial branch of dorsal primary ramus of L3; NR3, third lumbar nerve root; S, superior articular process of L5; SAB, superior articular branches from L4 (supplies L4-5  facet joint also); TP3, transverse process of L3.  Vitals:   12/27/20 0854 12/27/20 0925 12/27/20 0930 12/27/20 0941  BP: 127/73 (!) 130/96 132/81 117/78  Pulse: (!) 108   99  Resp: 16  18 18 16   Temp: (!) 96.6 F (35.9 C)   98.6 F (37 C)  TempSrc: Temporal   Temporal  SpO2: 100% 100% 97% 100%  Weight: 130 lb (59 kg)     Height: 5\' 2"  (1.575 m)        Start Time: 0929 hrs. End Time: 0934 hrs.  Imaging Guidance (Spinal):          Type of Imaging Technique: Fluoroscopy Guidance (Spinal) Indication(s): Assistance in needle guidance and placement for procedures requiring needle placement in or near specific anatomical locations not easily accessible without such assistance. Exposure Time: Please see nurses notes. Contrast: None used. Fluoroscopic Guidance: I was personally present during the use of fluoroscopy. "Tunnel Vision Technique" used to obtain the best possible view of the target area. Parallax error corrected before commencing the procedure. "Direction-depth-direction" technique used to introduce the needle under continuous pulsed fluoroscopy. Once target was reached, antero-posterior, oblique, and lateral fluoroscopic projection used confirm needle placement in all planes. Images permanently stored in EMR. Interpretation: No contrast injected. I personally interpreted the imaging intraoperatively. Adequate needle placement confirmed in multiple planes. Permanent images saved into the patient's record.  Antibiotic Prophylaxis:   Anti-infectives (From admission, onward)    None      Indication(s): None identified  Post-operative Assessment:  Post-procedure Vital Signs:  Pulse/HCG Rate: 99(!) 102 Temp: 98.6 F (37 C) Resp: 16 BP: 117/78 SpO2: 100 %  EBL: None  Complications: No immediate post-treatment complications observed by team, or reported by patient.  Note: The patient tolerated the entire procedure well. A repeat set of vitals were taken after the  procedure and the patient was kept under observation following institutional policy, for this type of procedure. Post-procedural neurological assessment was performed, showing return to baseline, prior to discharge. The patient was provided with post-procedure discharge instructions, including a section on how to identify potential problems. Should any problems arise concerning this procedure, the patient was given instructions to immediately contact us, at any time, without hesitation. In any case, we plan to contact the patient by telephone for a follow-up status report regarding this interventional procedure.  Comments:  No additional relevant information.  Plan of Care  Orders:  Orders Placed This Encounter  Procedures   LUMBAR FACET(MEDIAL BRANCH NERVE BLOCK) MBNB    Scheduling Instructions:     Procedure: Lumbar facet block (AKA.: Lumbosacral medial branch nerve block)     Side: Left-sided     Level: L4-5, & L5-S1 Facets (L4, L5, & S1 Medial Branch Nerves)     Sedation: Patient's choice.     Timeframe: Today    Order Specific Question:   Where will this procedure be performed?    Answer:   [facility]   CT LUMBAR SPINE WO CONTRAST    Patient presents with axial pain with possible radicular component. Please assist [place] in identifying specific level(s) and laterality of any additional findings such as: 1. Facet (Zygapophyseal) joint DJD (Hypertrophy, space narrowing, subchondral sclerosis, and/or osteophyte formation) 2. DDD and/or IVDD (Loss of disc height, desiccation, gas patterns, osteophytes, endplate sclerosis, or "Black disc disease") 3. Pars defects 4. Spondylolisthesis, spondylosis, and/or spondyloarthropathies (include Degree/Grade of displacement in mm) (stability) 5. Vertebral body Fractures (acute/chronic) (state percentage of collapse) 6. Demineralization (osteopenia/osteoporotic) 7. Bone pathology 8. Foraminal narrowing  9. Surgical changes 10. Central, Lateral  Recess, and/or Foraminal Stenosis (include AP diameter of stenosis in mm) 11. Surgical changes (hardware type, status, and presence of fibrosis) 12. Modic  Type Changes (MRI only) 13. IVDD (Disc bulge, protrusion, herniation, extrusion) (Level, laterality, extent)    Standing Status:   Future    Standing Expiration Date:   01/26/2021    Scheduling Instructions:     Imaging must be done as soon as possible. Inform patient that order will expire within 30 days and I will not renew it.    Order Specific Question:   Preferred imaging location?    Answer:   [facility]    Order Specific Question:   Call Results- Best Contact Number?    Answer:   [phone] ([facility])    Order Specific Question:   Radiology Contrast Protocol - do NOT remove file path    Answer:   [email or web address]   [facility] C-ARM 1-60 MIN NO REPORT    Intraoperative interpretation by procedural physician at [place].    Standing Status:   Standing    Number of Occurrences:   1    Order Specific Question:   Reason for exam:    Answer:   Assistance in needle guidance and placement for procedures requiring needle placement in or near specific anatomical locations not easily accessible without such assistance.   Provide equipment / supplies at bedside    "Block Tray" (Disposable  single use) Needle type: SpinalSpinal Amount/quantity: 3 Size: Regular (3.5-inch) Gauge: 22G    Standing Status:   Standing    Number of Occurrences:   1    Order Specific Question:   Specify    Answer:   Block Tray   Informed Consent Details: Physician/Practitioner Attestation; Transcribe to consent form and obtain patient signature    Nursing Order: Transcribe to consent form and obtain patient signature. Note: Always confirm laterality of pain with Ms. Gretzinger, before procedure.    Order Specific Question:   Physician/Practitioner attestation of informed consent for  procedure/surgical case    Answer:   I, the physician/practitioner, attest that I have discussed with the patient the benefits, risks, side effects, alternatives, likelihood of achieving goals and potential problems during recovery for the procedure that I have provided informed consent.    Order Specific Question:   Procedure    Answer:   Lumbar Facet Block  under fluoroscopic guidance    Order Specific Question:   Physician/Practitioner performing the procedure    Answer:   Leniya Breit A. Dossie Arbour MD    Order Specific Question:   Indication/Reason    Answer:   Low Back Pain, with our without leg pain, due to Facet Joint Arthralgia (Joint Pain) Spondylosis (Arthritis of the Spine), without myelopathy or radiculopathy (Nerve Damage).    Chronic Opioid Analgesic:  No opioid analgesics from our practice. MME/day: 0 mg/day   Medications ordered for procedure: Meds ordered this encounter  Medications   lidocaine (XYLOCAINE) 2 % (with pres) injection 400 mg   pentafluoroprop-tetrafluoroeth (GEBAUERS) aerosol   lactated ringers infusion 1,000 mL   midazolam (VERSED) 5 MG/5ML injection 0.5-2 mg    Make sure Flumazenil is available in the pyxis when using this medication. If oversedation occurs, administer 0.2 mg IV over 15 sec. If after 45 sec no response, administer 0.2 mg again over 1 min; may repeat at 1 min intervals; not to exceed 4 doses (1 mg)   ropivacaine (PF) 2 mg/mL (0.2%) (NAROPIN) injection 9 mL   triamcinolone acetonide (KENALOG-40) injection 40 mg    Medications administered: We administered lidocaine, pentafluoroprop-tetrafluoroeth, lactated ringers, midazolam, ropivacaine (PF) 2 mg/mL (0.2%), and triamcinolone  acetonide.  See the medical record for exact dosing, route, and time of administration.  Follow-up plan:   Return in about 2 weeks (around 01/10/2021) for Proc-day (T,Th), (VV), (PPE).      Interventional Therapies  Risk  Complexity Considerations:   WNL   Planned   Pending:   Therapeutic left thoracolumbar (T12, L1, L2, & L3) MB RFA #2    Under consideration:   Diagnostic left L1-2 LESI #1    Completed:   Diagnostic left thoracolumbar facet MBB (T12, L1, L2, & L3) block x2  Therapeutic left thoracolumbar (T12, L1, L2, & L3) MB RFA x2 (04/26/2020) (100/100/80) Diagnostic left lumbar facet MBB x2 (03/29/2020)  Therapeutic left lumbar (L4, L5, S1) MB RFA x1 (07/28/2019) (100/100/100/100) Note: The patient has indicated that she has had prior lumbar facet radiofrequency ablations at [facility].   Palliative options:   Palliative left thoracolumbar (T12, L1, L2, & L3) MB RFA #2  Palliative left lumbar (L4, L5, S1) MB RFA #2     Recent Visits Date Type Provider Dept  12/01/20 Procedure visit Gillis Santa, MD [facility]  11/28/20 Office Visit Milinda Pointer, MD [facility]  Showing recent visits within past 90 days and meeting all other requirements Today's Visits Date Type Provider Dept  12/27/20 Procedure visit Milinda Pointer, MD [facility]  Showing today's visits and meeting all other requirements Future Appointments Date Type Provider Dept  01/10/21 Appointment Milinda Pointer, MD [facility]  Showing future appointments within next 90 days and meeting all other requirements Disposition: Discharge home  Discharge (Date  Time): 12/27/2020; 0945 hrs.   Primary Care Physician: Ane Payment, PA Location: [facility] Note by: Gaspar Cola, MD Date: 12/27/2020; Time: 12:26 PM  Disclaimer:  Medicine is not an Chief Strategy Officer. The only guarantee in medicine is that nothing is guaranteed. It is important to note that the decision to proceed with this intervention was based on the information collected from the patient. The Data and conclusions were drawn from the patient's questionnaire, the interview, and the physical examination. Because the information was  provided in large part by the patient, it cannot be guaranteed that it has not been purposely or unconsciously manipulated. Every effort has been made to obtain as much relevant data as possible for this evaluation. It is important to note that the conclusions that lead to this procedure are derived in large part from the available data. Always take into account that the treatment will also be dependent on availability of resources and existing treatment guidelines, considered by other Pain Management Practitioners as being common knowledge and practice, at the time of the intervention. For Medico-Legal purposes, it is also important to point out that variation in procedural techniques and pharmacological choices are the acceptable norm. The indications, contraindications, technique, and results of the above procedure should only be interpreted and judged by a Board-Certified Interventional Pain Specialist with extensive familiarity and expertise in the same exact procedure and technique.

## 2020-12-27 ENCOUNTER — Encounter: Payer: Self-pay | Admitting: Pain Medicine

## 2020-12-27 ENCOUNTER — Ambulatory Visit
Admission: RE | Admit: 2020-12-27 | Discharge: 2020-12-27 | Disposition: A | Discharge status: Home / Self Care | Payer: Medicare HMO | Source: Ambulatory Visit | Attending: Pain Medicine | Admitting: Pain Medicine

## 2020-12-27 ENCOUNTER — Other Ambulatory Visit: Payer: Self-pay

## 2020-12-27 ENCOUNTER — Ambulatory Visit (HOSPITAL_BASED_OUTPATIENT_CLINIC_OR_DEPARTMENT_OTHER): Payer: Medicare HMO | Admitting: Pain Medicine

## 2020-12-27 VITALS — BP 117/78 | HR 99 | Temp 98.6°F | Resp 16 | Ht 62.0 in | Wt 130.0 lb

## 2020-12-27 DIAGNOSIS — M545 Low back pain, unspecified: Secondary | ICD-10-CM

## 2020-12-27 DIAGNOSIS — M47816 Spondylosis without myelopathy or radiculopathy, lumbar region: Secondary | ICD-10-CM

## 2020-12-27 DIAGNOSIS — M47817 Spondylosis without myelopathy or radiculopathy, lumbosacral region: Secondary | ICD-10-CM | POA: Diagnosis present

## 2020-12-27 DIAGNOSIS — M5137 Other intervertebral disc degeneration, lumbosacral region: Secondary | ICD-10-CM

## 2020-12-27 DIAGNOSIS — G8929 Other chronic pain: Secondary | ICD-10-CM | POA: Insufficient documentation

## 2020-12-27 DIAGNOSIS — M5136 Other intervertebral disc degeneration, lumbar region: Secondary | ICD-10-CM | POA: Diagnosis present

## 2020-12-27 DIAGNOSIS — M5134 Other intervertebral disc degeneration, thoracic region: Secondary | ICD-10-CM | POA: Insufficient documentation

## 2020-12-27 MED ORDER — TRIAMCINOLONE ACETONIDE 40 MG/ML IJ SUSP
40.0000 mg | Freq: Once | INTRAMUSCULAR | Status: AC
  Administered 2020-12-27: 40 mg

## 2020-12-27 MED ORDER — MIDAZOLAM HCL 5 MG/5ML IJ SOLN
0.5000 mg | Freq: Once | INTRAMUSCULAR | Status: AC
  Administered 2020-12-27: 2 mg via INTRAVENOUS

## 2020-12-27 MED ORDER — LIDOCAINE HCL 2 % IJ SOLN
20.0000 mL | Freq: Once | INTRAMUSCULAR | Status: AC
  Administered 2020-12-27: 100 mg

## 2020-12-27 MED ORDER — ROPIVACAINE HCL 2 MG/ML IJ SOLN
9.0000 mL | Freq: Once | INTRAMUSCULAR | Status: AC
  Administered 2020-12-27: 20 mL via PERINEURAL

## 2020-12-27 MED ORDER — PENTAFLUOROPROP-TETRAFLUOROETH EX AERO
INHALATION_SPRAY | Freq: Once | CUTANEOUS | Status: AC
  Administered 2020-12-27: 30 via TOPICAL
  Filled 2020-12-27: qty 116

## 2020-12-27 MED ORDER — LACTATED RINGERS IV SOLN
1000.0000 mL | Freq: Once | INTRAVENOUS | Status: AC
  Administered 2020-12-27: 1000 mL via INTRAVENOUS

## 2020-12-27 NOTE — Patient Instructions (Signed)

## 2020-12-27 NOTE — Progress Notes (Signed)
Safety precautions to be maintained throughout the outpatient stay will include: orient to surroundings, keep bed in low position, maintain call bell within reach at all times, provide assistance with transfer out of bed and ambulation.  

## 2020-12-28 ENCOUNTER — Telehealth: Payer: Self-pay | Admitting: *Deleted

## 2020-12-28 NOTE — Telephone Encounter (Signed)
No problems post procedure. 

## 2021-01-03 DIAGNOSIS — H2513 Age-related nuclear cataract, bilateral: Secondary | ICD-10-CM | POA: Diagnosis not present

## 2021-01-09 NOTE — Progress Notes (Addendum)
Patient: Angela Espinoza  Service Category: E/M  Provider: Gaspar Cola, MD  DOB: [birth year]  DOS: 01/10/2021  Location: Office  MRN: [id number]  Setting: Ambulatory outpatient  Referring Provider: Marylen Espinoza*  Type: Established Patient  Specialty: Interventional Pain Management  PCP: Ane Payment, PA  Location: Remote location  Delivery: TeleHealth     Virtual Encounter - Pain Management PROVIDER NOTE: Information contained herein reflects review and annotations entered in association with encounter. Interpretation of such information and data should be left to medically-trained personnel. Information provided to patient can be located elsewhere in the medical record under "Patient Instructions". Document created using STT-dictation technology, any transcriptional errors that may result from process are unintentional.    Contact & Pharmacy Preferred: [place]: [phone] (home) Mobile: [phone] (mobile) E-mail: [email or web address]  [id number]: [phone]: [phone]  Pre-screening  Angela Espinoza offered "in-person" vs "virtual" encounter. She indicated preferring virtual for this encounter.   Reason COVID-19*  Social distancing based on CDC and AMA recommendations.   I contacted [name] on 01/10/2021 via telephone.      I clearly identified myself as FGaspar Cola MD. I verified that I was speaking with the correct person using two identifiers (Name: BShae Espinoza and date of birth: 05/14/1955-07-26.  Consent I sought verbal advanced consent from BMadaline Savagefor virtual visit interactions. I informed Angela Espinoza of possible security and privacy concerns, risks, and limitations associated with providing "not-in-person" medical evaluation and management services. I also informed Angela Espinoza of the availability of "in-person" appointments. Finally, I informed her that there would be a charge for the virtual visit and that she could be  personally, fully or partially, financially responsible for it. Ms. CCarbinexpressed understanding and agreed to proceed.   Historic Elements   Ms. BMeleena Munroeis a 65y.o. year old, female patient evaluated today after our last contact on 12/27/2020. [name] has a past medical history of Anxiety, Asthma, Depression, Hyperlipidemia, Hypertension, and IBS (irritable bowel syndrome). She also  has a past surgical history that includes Oophorectomy; Exploratory laparotomy; Foot surgery; Colonoscopy; Tonsillectomy; Axillary lymph node biopsy (Right, 08/03/2014); Kyphoplasty (N/A, 12/10/2014); Abdominal hysterectomy (age 65; Breast biopsy (Left, 10/07/2018); and Breast biopsy (Left, 10/21/2019). Ms. CVanderschaafhas a current medication list which includes the following prescription(s): acetaminophen, biotin, black elderberry, black pepper-turmeric, calcium carb-cholecalciferol, duloxetine, gabapentin, losartan, meloxicam, multivitamin, omega-3 fatty acids, and vitamin c. She  reports that she has never smoked. She has been exposed to tobacco smoke. She has never used smokeless tobacco. She reports current alcohol use. She reports that she does not use drugs. Ms. CHaymanis allergic to indapamide.   HPI  Today, she is being contacted for a post-procedure assessment.  The patient currently indicates having an ongoing 100% relief of the pain in the area of the left lower back.  Post-Procedure Evaluation  Procedure(s):    Procedure:          Type: Lumbar Facet, Medial Branch Block(s)          Primary Purpose: Diagnostic/Therapeutic Region: Posterolateral Lumbosacral Spine Level: L4, L5, & S1 Medial Branch Level(s). Injecting these levels blocks the L4-5, and L5-S1 lumbar facet joints. Laterality: Left Anesthesia: Local (1-2% Lidocaine)   Anxiolysis: None  Sedation: None  Guidance: Fluoroscopy          Indications: 1. Lumbar facet syndrome (  Left)   2. Spondylosis without myelopathy or radiculopathy, lumbosacral region   3. DDD (degenerative disc disease), lumbosacral   4. Chronic low back pain (1ry area of Pain) (Left) w/o sciatica   5. Other intervertebral disc degeneration, lumbar region    Pain Score: Pre-procedure: 5 /10 Post-procedure: 0-No pain/10     Anxiolysis: Please see nurses note.  Effectiveness during initial hour after procedure (Ultra-Short Term Relief): 100 %.  Local anesthetic used: Long-acting (4-6 hours) Effectiveness: Defined as any analgesic benefit obtained secondary to the administration of local anesthetics. This carries significant diagnostic value as to the etiological location, or anatomical origin, of the pain. Duration of benefit is expected to coincide with the duration of the local anesthetic used.  Effectiveness during initial 4-6 hours after procedure (Short-Term Relief): 100 %.  Long-term benefit: Defined as any relief past the pharmacologic duration of the local anesthetics.  Effectiveness past the initial 6 hours after procedure (Long-Term Relief): 80 %.  Benefits, current: Defined as benefit present at the time of this evaluation.   Analgesia: Today the patient indicates having an ongoing 100% relief of the pain in the area of the left lower back. Function: Angela Espinoza reports improvement in function ROM: Angela Espinoza reports improvement in ROM  Pharmacotherapy Assessment   Analgesic: No opioid analgesics from our practice. MME/day: 0 mg/day   Monitoring: [place] PMP: PDMP reviewed during this encounter.       Pharmacotherapy: No side-effects or adverse reactions reported. Compliance: No problems identified. Effectiveness: Clinically acceptable. Plan: Refer to "POC". UDS:  Summary  Date Value Ref Range Status  12/10/2017 FINAL  Final    Comment:     ==================================================================== TOXASSURE COMP DRUG ANALYSIS,UR ==================================================================== Test                             Result       Flag       Units Drug Present and Declared for Prescription Verification   Cyclobenzaprine                PRESENT      EXPECTED   Amitriptyline                  PRESENT      EXPECTED   Metoprolol                     PRESENT      EXPECTED Drug Absent but Declared for Prescription Verification   Desmethylcyclobenzaprine       Not Detected UNEXPECTED   Duloxetine                     Not Detected UNEXPECTED ==================================================================== Test                      Result    Flag   Units      Ref Range   Creatinine              28               mg/dL      >=20 ==================================================================== Declared Medications:  The flagging and interpretation on this report are based on the  following declared medications.  Unexpected results may arise from  inaccuracies in the declared medications.  **Note: The testing scope of this panel includes these medications:  Amitriptyline  Cyclobenzaprine  Duloxetine  Metoprolol  **Note: The  testing scope of this panel does not include following  reported medications:  Cholecalciferol  Hydrochlorothiazide  Levocetirizine  Losartan (Losartan Potassium)  Magnesium  Meloxicam  Multivitamin  Supplement  Supplement (Omega-3)  Vitamin B (Biotin)  Vitamin C ==================================================================== For clinical consultation, please call [phone]. ====================================================================      Laboratory Chemistry Profile   Renal Lab Results  Component Value Date   BUN 10 01/01/2018   CREATININE 0.88 01/01/2018   BCR 11 (L) 01/01/2018   GFRAA 81 01/01/2018   GFRNONAA 70 01/01/2018    Hepatic Lab Results   Component Value Date   AST 26 01/01/2018   ALT 42 08/10/2011   ALBUMIN 4.6 01/01/2018   ALKPHOS 73 01/01/2018    Electrolytes Lab Results  Component Value Date   NA 136 01/01/2018   K 4.5 01/01/2018   CL 98 01/01/2018   CALCIUM 9.9 01/01/2018   MG 2.3 12/10/2017   PHOS 1.6 (L) 08/10/2011    Bone Lab Results  Component Value Date   25OHVITD1 40 01/01/2018   25OHVITD2 <1.0 01/01/2018   25OHVITD3 40 01/01/2018    Inflammation (CRP: Acute Phase) (ESR: Chronic Phase) Lab Results  Component Value Date   CRP 1 12/10/2017   ESRSEDRATE 3 12/10/2017         Note: Above Lab results reviewed.  Imaging  DG PAIN CLINIC C-ARM 1-60 MIN NO REPORT Fluoro was used, but no Radiologist interpretation will be provided.  Please refer to "NOTES" tab for provider progress note.  Assessment  The primary encounter diagnosis was Chronic low back pain (1ry area of Pain) (Left) w/o sciatica. Diagnoses of Lumbar facet syndrome (Left) and Thoracic facet syndrome (Left) were also pertinent to this visit.  Plan of Care  Problem-specific:  No problem-specific Assessment & Plan notes found for this encounter.  Angela Espinoza has a current medication list which includes the following long-term medication(s): calcium carb-cholecalciferol and losartan.  Pharmacotherapy (Medications Ordered): No orders of the defined types were placed in this encounter.  Orders:  No orders of the defined types were placed in this encounter.  Follow-up plan:   Return if symptoms worsen or fail to improve.     Interventional Therapies  Risk  Complexity Considerations:   WNL   Planned  Pending:   Therapeutic left thoracolumbar (T12, L1, L2, & L3) MB RFA #2    Under consideration:   Diagnostic left L1-2 LESI #1    Completed:   Diagnostic left thoracolumbar facet MBB (T12, L1, L2, & L3) block x2  Therapeutic left thoracolumbar (T12, L1, L2, & L3) MB RFA x2 (04/26/2020) (100/100/80) Diagnostic  left lumbar facet MBB x2 (03/29/2020)  Diagnostic left lumbar facet (L4, L5, S1) MBB x1 (12/27/2020) (100/100/80/100)  Therapeutic left lumbar (L4, L5, S1) MB RFA x1 (07/28/2019) (100/100/100/100) Note: The patient has indicated that she has had prior lumbar facet radiofrequency ablations at [facility].   Palliative options:   Palliative left thoracolumbar (T12, L1, L2, & L3) MB RFA #2  Palliative left lumbar (L4, L5, S1) MB RFA #2      Recent Visits Date Type Provider Dept  12/27/20 Procedure visit Milinda Pointer, MD [facility]  12/01/20 Procedure visit Gillis Santa, MD [facility]  11/28/20 Office Visit Milinda Pointer, MD [facility]  Showing recent visits within past 90 days and meeting all other requirements Today's Visits Date Type Provider Dept  01/10/21 Office Visit Milinda Pointer, MD [facility]  Showing today's visits and  meeting all other requirements Future Appointments No visits were found meeting these conditions. Showing future appointments within next 90 days and meeting all other requirements I discussed the assessment and treatment plan with the patient. The patient was provided an opportunity to ask questions and all were answered. The patient agreed with the plan and demonstrated an understanding of the instructions.  Patient advised to call back or seek an in-person evaluation if the symptoms or condition worsens.  Duration of encounter: 12 minutes.  Note by: Gaspar Cola, MD Date: 01/10/2021; Time: 1:31 PM

## 2021-01-10 ENCOUNTER — Ambulatory Visit: Payer: Medicare HMO | Attending: Pain Medicine | Admitting: Pain Medicine

## 2021-01-10 ENCOUNTER — Other Ambulatory Visit: Payer: Self-pay

## 2021-01-10 DIAGNOSIS — G8929 Other chronic pain: Secondary | ICD-10-CM

## 2021-01-10 DIAGNOSIS — M47816 Spondylosis without myelopathy or radiculopathy, lumbar region: Secondary | ICD-10-CM

## 2021-01-10 DIAGNOSIS — M545 Low back pain, unspecified: Secondary | ICD-10-CM | POA: Diagnosis not present

## 2021-01-10 DIAGNOSIS — M47894 Other spondylosis, thoracic region: Secondary | ICD-10-CM | POA: Diagnosis not present

## 2021-01-26 ENCOUNTER — Encounter: Payer: Self-pay | Admitting: Radiology

## 2021-01-26 ENCOUNTER — Encounter
Admission: EM | Disposition: A | Discharge status: Home Health | Payer: Self-pay | Source: Home / Self Care | Attending: Internal Medicine

## 2021-01-26 ENCOUNTER — Encounter: Payer: Self-pay | Admitting: Registered Nurse

## 2021-01-26 ENCOUNTER — Inpatient Hospital Stay: Payer: Medicare HMO

## 2021-01-26 ENCOUNTER — Emergency Department: Payer: Medicare HMO

## 2021-01-26 ENCOUNTER — Inpatient Hospital Stay
Admission: EM | Admit: 2021-01-26 | Discharge: 2021-01-31 | DRG: 377 | Disposition: A | Discharge status: Home Health | Payer: Medicare HMO | Attending: Internal Medicine | Admitting: Internal Medicine

## 2021-01-26 DIAGNOSIS — J9602 Acute respiratory failure with hypercapnia: Secondary | ICD-10-CM | POA: Diagnosis not present

## 2021-01-26 DIAGNOSIS — R571 Hypovolemic shock: Secondary | ICD-10-CM

## 2021-01-26 DIAGNOSIS — K6389 Other specified diseases of intestine: Secondary | ICD-10-CM | POA: Diagnosis not present

## 2021-01-26 DIAGNOSIS — R55 Syncope and collapse: Secondary | ICD-10-CM | POA: Diagnosis not present

## 2021-01-26 DIAGNOSIS — I959 Hypotension, unspecified: Secondary | ICD-10-CM | POA: Diagnosis not present

## 2021-01-26 DIAGNOSIS — F32A Depression, unspecified: Secondary | ICD-10-CM | POA: Diagnosis not present

## 2021-01-26 DIAGNOSIS — D649 Anemia, unspecified: Secondary | ICD-10-CM | POA: Diagnosis not present

## 2021-01-26 DIAGNOSIS — N179 Acute kidney failure, unspecified: Secondary | ICD-10-CM | POA: Diagnosis not present

## 2021-01-26 DIAGNOSIS — I469 Cardiac arrest, cause unspecified: Secondary | ICD-10-CM | POA: Diagnosis not present

## 2021-01-26 DIAGNOSIS — F419 Anxiety disorder, unspecified: Secondary | ICD-10-CM | POA: Diagnosis not present

## 2021-01-26 DIAGNOSIS — Z8709 Personal history of other diseases of the respiratory system: Secondary | ICD-10-CM | POA: Diagnosis not present

## 2021-01-26 DIAGNOSIS — K5939 Other megacolon: Secondary | ICD-10-CM | POA: Diagnosis not present

## 2021-01-26 DIAGNOSIS — R680 Hypothermia, not associated with low environmental temperature: Secondary | ICD-10-CM | POA: Diagnosis not present

## 2021-01-26 DIAGNOSIS — Z791 Long term (current) use of non-steroidal anti-inflammatories (NSAID): Secondary | ICD-10-CM | POA: Diagnosis not present

## 2021-01-26 DIAGNOSIS — A419 Sepsis, unspecified organism: Secondary | ICD-10-CM | POA: Diagnosis not present

## 2021-01-26 DIAGNOSIS — E785 Hyperlipidemia, unspecified: Secondary | ICD-10-CM | POA: Diagnosis present

## 2021-01-26 DIAGNOSIS — Z8719 Personal history of other diseases of the digestive system: Secondary | ICD-10-CM | POA: Diagnosis present

## 2021-01-26 DIAGNOSIS — K922 Gastrointestinal hemorrhage, unspecified: Secondary | ICD-10-CM | POA: Diagnosis not present

## 2021-01-26 DIAGNOSIS — K279 Peptic ulcer, site unspecified, unspecified as acute or chronic, without hemorrhage or perforation: Secondary | ICD-10-CM

## 2021-01-26 DIAGNOSIS — R578 Other shock: Secondary | ICD-10-CM | POA: Diagnosis not present

## 2021-01-26 DIAGNOSIS — E876 Hypokalemia: Secondary | ICD-10-CM | POA: Diagnosis not present

## 2021-01-26 DIAGNOSIS — Z4659 Encounter for fitting and adjustment of other gastrointestinal appliance and device: Secondary | ICD-10-CM

## 2021-01-26 DIAGNOSIS — J9601 Acute respiratory failure with hypoxia: Secondary | ICD-10-CM

## 2021-01-26 DIAGNOSIS — S0990XA Unspecified injury of head, initial encounter: Secondary | ICD-10-CM | POA: Diagnosis not present

## 2021-01-26 DIAGNOSIS — R509 Fever, unspecified: Secondary | ICD-10-CM

## 2021-01-26 DIAGNOSIS — I10 Essential (primary) hypertension: Secondary | ICD-10-CM | POA: Diagnosis present

## 2021-01-26 DIAGNOSIS — K589 Irritable bowel syndrome without diarrhea: Secondary | ICD-10-CM | POA: Diagnosis present

## 2021-01-26 DIAGNOSIS — G928 Other toxic encephalopathy: Secondary | ICD-10-CM | POA: Diagnosis present

## 2021-01-26 DIAGNOSIS — I629 Nontraumatic intracranial hemorrhage, unspecified: Secondary | ICD-10-CM | POA: Diagnosis not present

## 2021-01-26 DIAGNOSIS — K260 Acute duodenal ulcer with hemorrhage: Secondary | ICD-10-CM

## 2021-01-26 DIAGNOSIS — Z888 Allergy status to other drugs, medicaments and biological substances status: Secondary | ICD-10-CM

## 2021-01-26 DIAGNOSIS — K259 Gastric ulcer, unspecified as acute or chronic, without hemorrhage or perforation: Secondary | ICD-10-CM | POA: Diagnosis not present

## 2021-01-26 DIAGNOSIS — Z801 Family history of malignant neoplasm of trachea, bronchus and lung: Secondary | ICD-10-CM

## 2021-01-26 DIAGNOSIS — Z20822 Contact with and (suspected) exposure to covid-19: Secondary | ICD-10-CM | POA: Diagnosis not present

## 2021-01-26 DIAGNOSIS — D62 Acute posthemorrhagic anemia: Secondary | ICD-10-CM

## 2021-01-26 DIAGNOSIS — Z8249 Family history of ischemic heart disease and other diseases of the circulatory system: Secondary | ICD-10-CM

## 2021-01-26 DIAGNOSIS — I248 Other forms of acute ischemic heart disease: Secondary | ICD-10-CM | POA: Diagnosis present

## 2021-01-26 DIAGNOSIS — E872 Acidosis, unspecified: Secondary | ICD-10-CM | POA: Diagnosis not present

## 2021-01-26 DIAGNOSIS — M1990 Unspecified osteoarthritis, unspecified site: Secondary | ICD-10-CM | POA: Diagnosis not present

## 2021-01-26 DIAGNOSIS — Z4682 Encounter for fitting and adjustment of non-vascular catheter: Secondary | ICD-10-CM | POA: Diagnosis not present

## 2021-01-26 DIAGNOSIS — K264 Chronic or unspecified duodenal ulcer with hemorrhage: Secondary | ICD-10-CM | POA: Diagnosis not present

## 2021-01-26 DIAGNOSIS — T39395A Adverse effect of other nonsteroidal anti-inflammatory drugs [NSAID], initial encounter: Secondary | ICD-10-CM | POA: Diagnosis present

## 2021-01-26 DIAGNOSIS — J45909 Unspecified asthma, uncomplicated: Secondary | ICD-10-CM | POA: Diagnosis not present

## 2021-01-26 DIAGNOSIS — M47812 Spondylosis without myelopathy or radiculopathy, cervical region: Secondary | ICD-10-CM | POA: Diagnosis not present

## 2021-01-26 DIAGNOSIS — Z8711 Personal history of peptic ulcer disease: Secondary | ICD-10-CM | POA: Diagnosis not present

## 2021-01-26 DIAGNOSIS — K269 Duodenal ulcer, unspecified as acute or chronic, without hemorrhage or perforation: Secondary | ICD-10-CM | POA: Diagnosis not present

## 2021-01-26 DIAGNOSIS — Z978 Presence of other specified devices: Secondary | ICD-10-CM

## 2021-01-26 DIAGNOSIS — I6523 Occlusion and stenosis of bilateral carotid arteries: Secondary | ICD-10-CM | POA: Diagnosis not present

## 2021-01-26 DIAGNOSIS — T68XXXA Hypothermia, initial encounter: Secondary | ICD-10-CM

## 2021-01-26 HISTORY — PX: ESOPHAGOGASTRODUODENOSCOPY (EGD) WITH PROPOFOL: SHX5813

## 2021-01-26 LAB — BLOOD GAS, ARTERIAL
Acid-Base Excess: 8.2 mmol/L — ABNORMAL HIGH (ref 0.0–2.0)
Acid-base deficit: 24.5 mmol/L — ABNORMAL HIGH (ref 0.0–2.0)
Bicarbonate: 5.6 mmol/L — ABNORMAL LOW (ref 20.0–28.0)
Bicarbonate: 31.9 mmol/L — ABNORMAL HIGH (ref 20.0–28.0)
FIO2: 0.8
FIO2: 0.28
MECHVT: 400 mL
MECHVT: 400 mL
Mechanical Rate: 18
O2 Saturation: 99.9 %
O2 Saturation: 99.4 %
PEEP: 5 cmH2O
PEEP: 5 cmH2O
Patient temperature: 37
Patient temperature: 37
RATE: 18 resp/min
RATE: 18 resp/min
pCO2 arterial: 28 mmHg — ABNORMAL LOW (ref 32.0–48.0)
pCO2 arterial: 40 mmHg (ref 32.0–48.0)
pH, Arterial: 6.91 — CL (ref 7.350–7.450)
pH, Arterial: 7.51 — ABNORMAL HIGH (ref 7.350–7.450)
pO2, Arterial: 454 mmHg — ABNORMAL HIGH (ref 83.0–108.0)
pO2, Arterial: 141 mmHg — ABNORMAL HIGH (ref 83.0–108.0)

## 2021-01-26 LAB — URINALYSIS, ROUTINE W REFLEX MICROSCOPIC
Bilirubin Urine: NEGATIVE
Glucose, UA: 50 mg/dL — AB
Ketones, ur: NEGATIVE mg/dL
Nitrite: NEGATIVE
Protein, ur: 30 mg/dL — AB
Specific Gravity, Urine: >1.046 — ABNORMAL HIGH (ref 1.005–1.030)
pH: 5 (ref 5.0–8.0)

## 2021-01-26 LAB — LACTIC ACID, PLASMA
Lactic Acid, Venous: >9 mmol/L (ref 0.5–1.9)
Lactic Acid, Venous: 4.9 mmol/L (ref 0.5–1.9)
Lactic Acid, Venous: 1.4 mmol/L (ref 0.5–1.9)
Lactic Acid, Venous: 0.9 mmol/L (ref 0.5–1.9)

## 2021-01-26 LAB — CBC WITH DIFFERENTIAL/PLATELET
Abs Immature Granulocytes: 0.41 10*3/uL — ABNORMAL HIGH (ref 0.00–0.07)
Basophils Absolute: 0.1 10*3/uL (ref 0.0–0.1)
Basophils Relative: 0 %
Eosinophils Absolute: 0.1 10*3/uL (ref 0.0–0.5)
Eosinophils Relative: 0 %
HCT: 13.7 % — CL (ref 36.0–46.0)
Hemoglobin: 3.8 g/dL — CL (ref 12.0–15.0)
Immature Granulocytes: 2 %
Lymphocytes Relative: 37 %
Lymphs Abs: 6.8 10*3/uL — ABNORMAL HIGH (ref 0.7–4.0)
MCH: 28.4 pg (ref 26.0–34.0)
MCHC: 27.7 g/dL — ABNORMAL LOW (ref 30.0–36.0)
MCV: 102.2 fL — ABNORMAL HIGH (ref 80.0–100.0)
Monocytes Absolute: 0.9 10*3/uL (ref 0.1–1.0)
Monocytes Relative: 5 %
Neutro Abs: 10.4 10*3/uL — ABNORMAL HIGH (ref 1.7–7.7)
Neutrophils Relative %: 56 %
Platelets: 491 10*3/uL — ABNORMAL HIGH (ref 150–400)
RBC: 1.34 MIL/uL — ABNORMAL LOW (ref 3.87–5.11)
RDW: 13.7 % (ref 11.5–15.5)
WBC: 18.6 10*3/uL — ABNORMAL HIGH (ref 4.0–10.5)
nRBC: 0.2 % (ref 0.0–0.2)

## 2021-01-26 LAB — IRON AND TIBC
Iron: 75 ug/dL (ref 28–170)
Saturation Ratios: 35 % — ABNORMAL HIGH (ref 10.4–31.8)
TIBC: 217 ug/dL — ABNORMAL LOW (ref 250–450)
UIBC: 142 ug/dL

## 2021-01-26 LAB — HEMOGLOBIN AND HEMATOCRIT, BLOOD
HCT: 11.3 % — CL (ref 36.0–46.0)
HCT: 23.5 % — ABNORMAL LOW (ref 36.0–46.0)
HCT: 24.9 % — ABNORMAL LOW (ref 36.0–46.0)
Hemoglobin: 3 g/dL — CL (ref 12.0–15.0)
Hemoglobin: 7.8 g/dL — ABNORMAL LOW (ref 12.0–15.0)
Hemoglobin: 8.8 g/dL — ABNORMAL LOW (ref 12.0–15.0)

## 2021-01-26 LAB — COMPREHENSIVE METABOLIC PANEL
ALT: 13 U/L (ref 0–44)
AST: 23 U/L (ref 15–41)
Albumin: 2.2 g/dL — ABNORMAL LOW (ref 3.5–5.0)
Alkaline Phosphatase: 46 U/L (ref 38–126)
Anion gap: 22 — ABNORMAL HIGH (ref 5–15)
BUN: 31 mg/dL — ABNORMAL HIGH (ref 8–23)
CO2: 10 mmol/L — ABNORMAL LOW (ref 22–32)
Calcium: 8.8 mg/dL — ABNORMAL LOW (ref 8.9–10.3)
Chloride: 106 mmol/L (ref 98–111)
Creatinine, Ser: 1.23 mg/dL — ABNORMAL HIGH (ref 0.44–1.00)
GFR, Estimated: 48 mL/min — ABNORMAL LOW (ref >60)
Glucose, Bld: 484 mg/dL — ABNORMAL HIGH (ref 70–99)
Potassium: 4.2 mmol/L (ref 3.5–5.1)
Sodium: 138 mmol/L (ref 135–145)
Total Bilirubin: 0.5 mg/dL (ref 0.3–1.2)
Total Protein: 4.6 g/dL — ABNORMAL LOW (ref 6.5–8.1)

## 2021-01-26 LAB — GLUCOSE, CAPILLARY
Glucose-Capillary: 176 mg/dL — ABNORMAL HIGH (ref 70–99)
Glucose-Capillary: 214 mg/dL — ABNORMAL HIGH (ref 70–99)
Glucose-Capillary: 282 mg/dL — ABNORMAL HIGH (ref 70–99)
Glucose-Capillary: 45 mg/dL — ABNORMAL LOW (ref 70–99)
Glucose-Capillary: 80 mg/dL (ref 70–99)
Glucose-Capillary: 89 mg/dL (ref 70–99)

## 2021-01-26 LAB — MAGNESIUM: Magnesium: 2.1 mg/dL (ref 1.7–2.4)

## 2021-01-26 LAB — CBC
HCT: 22.7 % — ABNORMAL LOW (ref 36.0–46.0)
Hemoglobin: 8 g/dL — ABNORMAL LOW (ref 12.0–15.0)
MCH: 29.1 pg (ref 26.0–34.0)
MCHC: 35.2 g/dL (ref 30.0–36.0)
MCV: 82.5 fL (ref 80.0–100.0)
Platelets: 247 10*3/uL (ref 150–400)
RBC: 2.75 MIL/uL — ABNORMAL LOW (ref 3.87–5.11)
RDW: 15.1 % (ref 11.5–15.5)
WBC: 17 10*3/uL — ABNORMAL HIGH (ref 4.0–10.5)
nRBC: 0.1 % (ref 0.0–0.2)

## 2021-01-26 LAB — ABO/RH: ABO/RH(D): O POS

## 2021-01-26 LAB — RESP PANEL BY RT-PCR (FLU A&B, COVID) ARPGX2
Influenza A by PCR: NEGATIVE
Influenza B by PCR: NEGATIVE
SARS Coronavirus 2 by RT PCR: NEGATIVE

## 2021-01-26 LAB — TROPONIN I (HIGH SENSITIVITY)
Troponin I (High Sensitivity): 31 ng/L — ABNORMAL HIGH (ref <18)
Troponin I (High Sensitivity): 46 ng/L — ABNORMAL HIGH (ref <18)

## 2021-01-26 LAB — MRSA NEXT GEN BY PCR, NASAL: MRSA by PCR Next Gen: NOT DETECTED

## 2021-01-26 LAB — PHOSPHORUS: Phosphorus: 4.2 mg/dL (ref 2.5–4.6)

## 2021-01-26 LAB — PROTIME-INR
INR: 1.8 — ABNORMAL HIGH (ref 0.8–1.2)
Prothrombin Time: 20.7 seconds — ABNORMAL HIGH (ref 11.4–15.2)

## 2021-01-26 LAB — RETICULOCYTES
Immature Retic Fract: 27.6 % — ABNORMAL HIGH (ref 2.3–15.9)
RBC.: 2.12 MIL/uL — ABNORMAL LOW (ref 3.87–5.11)
Retic Count, Absolute: 103 10*3/uL (ref 19.0–186.0)
Retic Ct Pct: 4.9 % — ABNORMAL HIGH (ref 0.4–3.1)

## 2021-01-26 LAB — HIV ANTIBODY (ROUTINE TESTING W REFLEX): HIV Screen 4th Generation wRfx: NONREACTIVE

## 2021-01-26 LAB — FERRITIN: Ferritin: 75 ng/mL (ref 11–307)

## 2021-01-26 LAB — PROCALCITONIN: Procalcitonin: <0.1 ng/mL

## 2021-01-26 LAB — FOLATE: Folate: 62 ng/mL (ref >5.9)

## 2021-01-26 LAB — LIPASE, BLOOD: Lipase: 27 U/L (ref 11–51)

## 2021-01-26 SURGERY — ESOPHAGOGASTRODUODENOSCOPY (EGD) WITH PROPOFOL
Anesthesia: General

## 2021-01-26 MED ORDER — CHLORHEXIDINE GLUCONATE 0.12% ORAL RINSE (MEDLINE KIT)
15.0000 mL | Freq: Two times a day (BID) | OROMUCOSAL | Status: DC
  Administered 2021-01-26 – 2021-01-29 (×7): 15 mL via OROMUCOSAL

## 2021-01-26 MED ORDER — METRONIDAZOLE 500 MG/100ML IV SOLN
500.0000 mg | Freq: Once | INTRAVENOUS | Status: AC
  Administered 2021-01-26: 10:00:00 500 mg via INTRAVENOUS
  Filled 2021-01-26: qty 100

## 2021-01-26 MED ORDER — DEXTROSE 50 % IV SOLN
INTRAVENOUS | Status: AC
  Administered 2021-01-26: 17:00:00 50 mL via INTRAVENOUS
  Filled 2021-01-26: qty 50

## 2021-01-26 MED ORDER — POLYETHYLENE GLYCOL 3350 17 G PO PACK
17.0000 g | PACK | Freq: Every day | ORAL | Status: DC | PRN
  Administered 2021-01-30: 17 g via ORAL
  Filled 2021-01-26: qty 1

## 2021-01-26 MED ORDER — PROPOFOL 1000 MG/100ML IV EMUL: 0.0000 ug/kg/min | INTRAVENOUS | Status: DC

## 2021-01-26 MED ORDER — SODIUM BICARBONATE 8.4 % IV SOLN
50.0000 meq | Freq: Once | INTRAVENOUS | Status: AC
  Administered 2021-01-26: 07:00:00 50 meq via INTRAVENOUS
  Filled 2021-01-26: qty 50

## 2021-01-26 MED ORDER — SODIUM CHLORIDE 0.9 % IV BOLUS (SEPSIS): 1000.0000 mL | Freq: Once | INTRAVENOUS | Status: DC

## 2021-01-26 MED ORDER — MIDAZOLAM HCL 2 MG/2ML IJ SOLN
2.0000 mg | INTRAMUSCULAR | Status: DC | PRN
  Administered 2021-01-27: 22:00:00 2 mg via INTRAVENOUS
  Filled 2021-01-26 (×4): qty 2

## 2021-01-26 MED ORDER — ETOMIDATE 2 MG/ML IV SOLN
INTRAVENOUS | Status: AC
  Administered 2021-01-26: 04:00:00 20 mg
  Filled 2021-01-26: qty 20

## 2021-01-26 MED ORDER — DEXTROSE 50 % IV SOLN
50.0000 mL | Freq: Once | INTRAVENOUS | Status: AC
Start: 2021-01-26 — End: 2021-01-26

## 2021-01-26 MED ORDER — SUCCINYLCHOLINE CHLORIDE 200 MG/10ML IV SOSY
PREFILLED_SYRINGE | INTRAVENOUS | Status: AC
  Administered 2021-01-26: 04:00:00 150 mg
  Filled 2021-01-26: qty 10

## 2021-01-26 MED ORDER — SODIUM CHLORIDE (PF) 0.9 % IJ SOLN
PREFILLED_SYRINGE | INTRAMUSCULAR | Status: DC | PRN
  Administered 2021-01-26: 15:00:00 3.5 mL

## 2021-01-26 MED ORDER — PANTOPRAZOLE 80MG IVPB - SIMPLE MED
80.0000 mg | Freq: Once | INTRAVENOUS | Status: AC
  Administered 2021-01-26: 08:00:00 80 mg via INTRAVENOUS
  Filled 2021-01-26: qty 100

## 2021-01-26 MED ORDER — PANTOPRAZOLE INFUSION (NEW) - SIMPLE MED
8.0000 mg/h | INTRAVENOUS | Status: AC
  Administered 2021-01-26 – 2021-01-28 (×7): 8 mg/h via INTRAVENOUS
  Filled 2021-01-26 (×2): qty 100
  Filled 2021-01-26: qty 80
  Filled 2021-01-26 (×4): qty 100

## 2021-01-26 MED ORDER — FENTANYL BOLUS VIA INFUSION
25.0000 ug | INTRAVENOUS | Status: DC | PRN
  Filled 2021-01-26: qty 100

## 2021-01-26 MED ORDER — MIDAZOLAM HCL 2 MG/2ML IJ SOLN: 2.0000 mg | Freq: Once | INTRAMUSCULAR | Status: AC

## 2021-01-26 MED ORDER — SODIUM CHLORIDE 0.9% IV SOLUTION: Freq: Once | INTRAVENOUS | Status: AC

## 2021-01-26 MED ORDER — VANCOMYCIN HCL IN DEXTROSE 1-5 GM/200ML-% IV SOLN
1000.0000 mg | Freq: Once | INTRAVENOUS | Status: AC
  Administered 2021-01-26: 09:00:00 1000 mg via INTRAVENOUS
  Filled 2021-01-26: qty 200

## 2021-01-26 MED ORDER — METOCLOPRAMIDE HCL 5 MG/ML IJ SOLN
5.0000 mg | Freq: Once | INTRAMUSCULAR | Status: AC
  Administered 2021-01-26: 09:00:00 5 mg via INTRAVENOUS
  Filled 2021-01-26: qty 2

## 2021-01-26 MED ORDER — CHLORHEXIDINE GLUCONATE CLOTH 2 % EX PADS
6.0000 | MEDICATED_PAD | Freq: Every day | CUTANEOUS | Status: DC
  Administered 2021-01-26 – 2021-01-30 (×4): 6 via TOPICAL

## 2021-01-26 MED ORDER — MIDAZOLAM HCL 2 MG/2ML IJ SOLN
INTRAMUSCULAR | Status: AC
  Administered 2021-01-26: 06:00:00 2 mg
  Filled 2021-01-26: qty 2

## 2021-01-26 MED ORDER — ORAL CARE MOUTH RINSE
15.0000 mL | OROMUCOSAL | Status: DC
  Administered 2021-01-26 – 2021-01-29 (×31): 15 mL via OROMUCOSAL

## 2021-01-26 MED ORDER — DOCUSATE SODIUM 100 MG PO CAPS
100.0000 mg | ORAL_CAPSULE | Freq: Two times a day (BID) | ORAL | Status: DC | PRN
  Administered 2021-01-30: 100 mg via ORAL

## 2021-01-26 MED ORDER — NOREPINEPHRINE 4 MG/250ML-% IV SOLN
2.0000 ug/min | INTRAVENOUS | Status: DC
  Administered 2021-01-26: 13:00:00 7 ug/min via INTRAVENOUS
  Administered 2021-01-27: 02:00:00 4 ug/min via INTRAVENOUS
  Administered 2021-01-28: 2 ug/min via INTRAVENOUS
  Filled 2021-01-26 (×3): qty 250

## 2021-01-26 MED ORDER — SODIUM CHLORIDE 0.9 % IV SOLN
2.0000 g | INTRAVENOUS | Status: DC
  Administered 2021-01-26: 12:00:00 2 g via INTRAVENOUS
  Filled 2021-01-26: qty 20
  Filled 2021-01-26: qty 2

## 2021-01-26 MED ORDER — MIDAZOLAM-SODIUM CHLORIDE 100-0.9 MG/100ML-% IV SOLN
0.5000 mg/h | INTRAVENOUS | Status: DC
Start: 2021-01-26 — End: 2021-01-27
  Administered 2021-01-26: 18:00:00 5 mg/h via INTRAVENOUS
  Administered 2021-01-26: 06:00:00 1 mg/h via INTRAVENOUS
  Filled 2021-01-26 (×2): qty 100

## 2021-01-26 MED ORDER — SODIUM BICARBONATE 8.4 % IV SOLN
INTRAVENOUS | Status: AC
  Filled 2021-01-26: qty 1000
  Filled 2021-01-26: qty 150
  Filled 2021-01-26: qty 1000

## 2021-01-26 MED ORDER — DOCUSATE SODIUM 50 MG/5ML PO LIQD
100.0000 mg | Freq: Two times a day (BID) | ORAL | Status: DC
  Administered 2021-01-26 – 2021-01-27 (×2): 100 mg
  Filled 2021-01-26 (×3): qty 10

## 2021-01-26 MED ORDER — FENTANYL 2500MCG IN NS 250ML (10MCG/ML) PREMIX INFUSION
25.0000 ug/h | INTRAVENOUS | Status: DC
  Administered 2021-01-26: 07:00:00 50 ug/h via INTRAVENOUS
  Administered 2021-01-26: 150 ug/h via INTRAVENOUS
  Filled 2021-01-26 (×2): qty 250

## 2021-01-26 MED ORDER — MUPIROCIN 2 % EX OINT
1.0000 "application " | TOPICAL_OINTMENT | Freq: Two times a day (BID) | CUTANEOUS | Status: DC
  Administered 2021-01-26 – 2021-01-30 (×9): 1 via NASAL
  Filled 2021-01-26: qty 22

## 2021-01-26 MED ORDER — SODIUM CHLORIDE 0.9 % IV SOLN
2.0000 g | Freq: Once | INTRAVENOUS | Status: AC
  Administered 2021-01-26: 08:00:00 2 g via INTRAVENOUS
  Filled 2021-01-26: qty 2

## 2021-01-26 MED ORDER — SODIUM CHLORIDE 0.9 % IV SOLN
10.0000 mL/h | Freq: Once | INTRAVENOUS | Status: AC
  Administered 2021-01-26: 08:00:00 10 mL/h via INTRAVENOUS

## 2021-01-26 MED ORDER — NOREPINEPHRINE 4 MG/250ML-% IV SOLN
INTRAVENOUS | Status: AC
  Administered 2021-01-26: 06:00:00 8 ug/min via INTRAVENOUS
  Administered 2021-01-26: 05:00:00 5 ug/min
  Filled 2021-01-26: qty 250

## 2021-01-26 MED ORDER — SODIUM CHLORIDE 0.9 % IV SOLN: 250.0000 mL | INTRAVENOUS | Status: DC

## 2021-01-26 MED ORDER — IOHEXOL 350 MG/ML SOLN
130.0000 mL | Freq: Once | INTRAVENOUS | Status: AC | PRN
  Administered 2021-01-26: 05:00:00 130 mL via INTRAVENOUS

## 2021-01-26 MED ORDER — POLYETHYLENE GLYCOL 3350 17 G PO PACK
17.0000 g | PACK | Freq: Every day | ORAL | Status: DC
  Administered 2021-01-27: 09:00:00 17 g
  Filled 2021-01-26: qty 1

## 2021-01-26 MED ORDER — INSULIN ASPART 100 UNIT/ML IJ SOLN
0.0000 [IU] | INTRAMUSCULAR | Status: DC
  Administered 2021-01-26: 12:00:00 3 [IU] via SUBCUTANEOUS
  Filled 2021-01-26: qty 1

## 2021-01-26 MED ORDER — PROPOFOL 1000 MG/100ML IV EMUL
INTRAVENOUS | Status: AC
  Administered 2021-01-26: 04:00:00 10 ug/kg/min
  Filled 2021-01-26: qty 100

## 2021-01-26 NOTE — ED Notes (Signed)
Pt returned from CT, critical care NP at bedside. Pt biting tube and grabbing at lines, removed left hand PIV.

## 2021-01-26 NOTE — Op Note (Signed)
[facility] Patient Name: Angela Espinoza Procedure Date: 01/26/2021 2:35 PM MRN: [id number] Account #: [id number] Date of Birth: 1955/02/13 Admit Type: Inpatient Age: 65 Room: [facility] ENDO ROOM 3 Gender: Female Note Status: Finalized Instrument Name: Upper Endoscope [id number] Procedure:             Upper GI endoscopy Indications:           Suspected upper gastrointestinal bleeding, Abnormal CT                         of the GI tract Providers:             Andrey Farmer MD, MD Medicines:             Monitored Anesthesia Care Complications:         No immediate complications. Estimated blood loss:                         Minimal. Procedure:             Pre-Anesthesia Assessment:                        - Prior to the procedure, a History and Physical was                         performed, and patient medications and allergies were                         reviewed. The patient is competent. The risks and                         benefits of the procedure and the sedation options and                         risks were discussed with the patient. All questions                         were answered and informed consent was obtained.                         Patient identification and proposed procedure were                         verified by the physician, the nurse and the                         technician in the endoscopy suite. Mental Status                         Examination: sedated. Airway Examination: normal                         oropharyngeal airway and neck mobility. CV                         Examination: normal. Prophylactic Antibiotics: The                         patient does not require prophylactic antibiotics.  Prior Anticoagulants: The patient has taken no                         previous anticoagulant or antiplatelet agents. ASA                         Grade Assessment: E - Emergency. After reviewing the                          risks and benefits, the patient was deemed in                         satisfactory condition to undergo the procedure. The                         anesthesia plan was to use general anesthesia.                         Immediately prior to administration of medications,                         the patient was re-assessed for adequacy to receive                         sedatives. The heart rate, respiratory rate, oxygen                         saturations, blood pressure, adequacy of pulmonary                         ventilation, and response to care were monitored                         throughout the procedure. The physical status of the                         patient was re-assessed after the procedure.                        After obtaining informed consent, the endoscope was                         passed under direct vision. Throughout the procedure,                         the patient's blood pressure, pulse, and oxygen                         saturations were monitored continuously. The                         Endosonoscope was introduced through the mouth, and                         advanced to the second part of duodenum. The upper GI                         endoscopy was somewhat difficult due to excessive  bleeding. Successful completion of the procedure was                         aided by controlling the bleeding. The patient                         tolerated the procedure well. Findings:      The examined esophagus was normal.      The entire examined stomach was normal.      One partially obstructing non-bleeding cratered duodenal ulcer with a       visible vessel was found in the duodenal bulb and in the first portion       of the duodenum. The ulcer was giant as it involved the entire duodenal       bulb and duodenal sweep. There is no evidence of perforation. Area       around the visible vessel was successfully injected with 3  mL of a       1:10,000 solution of epinephrine for hemostasis. Changed to bleeder       scope in order to use 10 french bipolar cautery. Coagulation for       bleeding prevention using bipolar probe was successful. Estimated blood       loss was minimal. Several passes made from bulb to 2nd portion of       duodenum to make sure no other visible vessels were present. None were       seen but difficult to fully evaluate. A clip was not placed for marking       due to no adequate place close to ulcer for clip placement. Impression:            - Normal esophagus.                        - Normal stomach.                        - Partially obstructing non-bleeding duodenal ulcer                         with a visible vessel. NSAID induced etiology. There                         is no evidence of perforation. Injected. Treated with                         bipolar cautery.                        - No specimens collected. Recommendation:        - Return patient to ICU for ongoing care.                        - Clear liquid diet.                        - Give Protonix (pantoprazole): 8 mg/hr IV by                         continuous infusion for 3 days.                        -  If patient has hemodynamically significant bleeding                         then would recommend vascular consult for empiric GDA                         embolization as the visible vessel was in the                         posterior bulb area. Procedure Code(s):     --- Professional ---                        [phone], Esophagogastroduodenoscopy, flexible,                         transoral; with control of bleeding, any method Diagnosis Code(s):     --- Professional ---                        [id number], Adverse effect of other nonsteroidal                         anti-inflammatory drugs [NSAID], sequela                        K26.4, Chronic or unspecified duodenal ulcer with                         hemorrhage                         R93.3, Abnormal findings on diagnostic imaging of                         other parts of digestive tract CPT copyright 2019 [employer]. All rights reserved. The codes documented in this report are preliminary and upon coder review may  be revised to meet current compliance requirements. Andrey Farmer MD, MD 01/26/2021 4:00:02 PM Number of Addenda: 0 Note Initiated On: 01/26/2021 2:35 PM Estimated Blood Loss:  Estimated blood loss was minimal.      [facility]

## 2021-01-26 NOTE — Sepsis Progress Note (Signed)
Repeat LA pending. Sepsis fluid boluses being held. Hgb 3. PRBC and FFP transfusions underway. On levophed gtt. Central line placed. Bicarb gtt and antibiotics started.

## 2021-01-26 NOTE — ED Notes (Signed)
Spoke to primary ED RN Lou Cal, referencing when cleaning up the ED treatment room. A vial of 2mg /6ml versed was open and 1mg  remained in the vial , witnessed via 3m and this .  When speaking to Benson Norway she does recall a verbal order per admitting provider to give 1mg  versed push.

## 2021-01-26 NOTE — Progress Notes (Signed)
Consult generated due to pressor being started. NP at bedside placing central line.

## 2021-01-26 NOTE — Consult Note (Signed)
Consultation  Referring Provider:     ICU Admit date: 12/29 Consult date: 12/29         Reason for Consultation:     Anemia/Abnormal imaging         HPI:    Angela Espinoza is a 65 y/o lady with history of arthritis and back pain who presented to the ED with syncopal episode that required brief CPR and intubation (CPR only 15 seconds before patient was pushing them away). History was gathered from husband and daughter due to patient being sedated and intubated. Over the past few weeks patient has had decreased appetite and only been drinking liquids. Yesterday she felt weak and had to be helped to the bathroom but recovered and was normal last night according to husband. Last night she had to be helped to the bathroom but got stiff and diaphoretic so husband brought her for evaluation. Underwent CTA that showed large duodenal bulb ulcer that was not actively bleeding. Family doesn't think she was having melenic stools at home but unsure. She did vomit yesterday but due to the color of the floors, unsure if any blood was in it. She takes meloxicam daily and is not on a PPI regularly. No significant alcohol use. No smoking.   Past Medical History:  Diagnosis Date   Anxiety    Asthma    NO INHALER-SMOKE AND PERFUME TRIGGERS ASTHMA   Depression    Hyperlipidemia    Hypertension    IBS (irritable bowel syndrome)     Past Surgical History:  Procedure Laterality Date   ABDOMINAL HYSTERECTOMY  age 82   AXILLARY LYMPH NODE BIOPSY Right 08/03/2014   Procedure: EXCISION RIGHT  AXILLARY LIPOMATOSIS;  Surgeon: Nadeen Landau, MD;  Location: [facility] OR[facility];  Service: General;  Laterality: Right;   BREAST BIOPSY Left 10/07/2018   Stereo, Coil Clip, BENIGN BREAST TISSUE WITH AREAS CONTAINING A PREDOMINANCE OF ADIPOSE TISSUE   BREAST BIOPSY Left 10/21/2019   stereo, ribbon clip, path pending    COLONOSCOPY     EXPLORATORY LAPAROTOMY     FOOT SURGERY     KYPHOPLASTY N/A 12/10/2014   Procedure:  KYPHOPLASTY L 1;  Surgeon: Kennedy Bucker, MD;  Location: [facility];  Service: Orthopedics;  Laterality: N/A;   OOPHORECTOMY     TONSILLECTOMY      Family History  Problem Relation Age of Onset   Heart attack Mother    Lung cancer Father    Breast cancer Neg Hx     Social History   Tobacco Use   Smoking status: Never    Passive exposure: Yes   Smokeless tobacco: Never   Tobacco comments:    Parents were smokers   Vaping Use   Vaping Use: Never used  Substance Use Topics   Alcohol use: Yes    Comment: occ-wine   Drug use: No    Prior to Admission medications   Medication Sig Start Date End Date Taking? Authorizing Provider  acetaminophen (TYLENOL) 500 MG tablet Take 500 mg by mouth every 6 (six) hours as needed.    [provider]  Biotin 1000 MCG CHEW Chew 1,000 mcg by mouth daily.     [provider]  BLACK ELDERBERRY PO Take 1 capsule by mouth daily.     [provider]  Black Pepper-Turmeric 3-500 MG CAPS Take by mouth.    [provider]  Calcium Carbonate-Vitamin D (CALCIUM 500 + D PO) Take 1 tablet by mouth daily. 08/18/18  [provider]  DULoxetine (CYMBALTA) 30 MG capsule Take 30 mg by mouth 2 (two) times daily.    [provider]  gabapentin (NEURONTIN) 300 MG capsule Take 300 mg by mouth 3 (three) times daily.    [provider]  losartan (COZAAR) 25 MG tablet Take 25 mg by mouth every morning.    [provider]  meloxicam (MOBIC) 15 MG tablet Take 15 mg by mouth daily. 03/20/20   [provider]  Multiple Vitamin (MULTIVITAMIN) capsule Take 1 capsule by mouth daily.    [provider]  Omega-3 Fatty Acids (FISH OIL PO) Take 1 tablet by mouth daily.    [provider]  SENNA PLUS 8.6-50 MG tablet Take 1 tablet by mouth daily. 01/19/21   [provider]  vitamin C (ASCORBIC ACID) 500 MG tablet Take 1,000 mg by mouth daily.     [provider]     Current Facility-Administered Medications  Medication Dose Route Frequency Provider Last Rate Last Admin   0.9 %  sodium chloride infusion  10 mL/hr Intravenous Once Sung, Jade J, MD       0.9 %  sodium chloride infusion  250 mL Intravenous Continuous Ouma, Hubbard Hartshorn, NP       ceFEPIme (MAXIPIME) 2 g in sodium chloride 0.9 % 100 mL IVPB  2 g Intravenous Once Sung, Jade J, MD       docusate (COLACE) 50 MG/5ML liquid 100 mg  100 mg Per Tube BID Jimmye Norman, NP       docusate sodium (COLACE) capsule 100 mg  100 mg Oral BID PRN Jimmye Norman, NP       fentaNYL (SUBLIMAZE) bolus via infusion 25-100 mcg  25-100 mcg Intravenous Q1H PRN Jimmye Norman, NP       fentaNYL in NS (78mcg/ml) infusion-PREMIX  25-200 mcg/hr Intravenous Continuous Jimmye Norman, NP 5 mL/hr at 01/26/21 0642 50 mcg/hr at 01/26/21 3383   metroNIDAZOLE (FLAGYL) IVPB 500 mg  500 mg Intravenous Once Irean Hong, MD       midazolam (VERSED) 100 mg/100 mL (1 mg/mL) premix infusion  0.5-10 mg/hr Intravenous Continuous Jimmye Norman, NP 1 mL/hr at 01/26/21 2919 1 mg/hr at 01/26/21 1660   midazolam (VERSED) injection 2 mg  2 mg Intravenous Q2H PRN Jimmye Norman, NP       norepinephrine (LEVOPHED) 4mg  in (0.016 mg/mL) premix infusion  2-10 mcg/min Intravenous Titrated , NP 30 mL/hr at 01/26/21 0536 8 mcg/min at 01/26/21 0536   pantoprazole (PROTONIX) 80 mg /NS 100 mL IVPB  80 mg Intravenous Once 01/28/21, MD       pantoprozole (PROTONIX) 80 mg /NS 100 mL infusion  8 mg/hr Intravenous Continuous Irean Hong, MD       polyethylene glycol (MIRALAX / GLYCOLAX) packet 17 g  17 g Per Tube Daily Ouma, Irean Hong, NP       polyethylene glycol (MIRALAX / GLYCOLAX) packet 17 g  17 g Oral Daily PRN Hubbard Hartshorn, NP       propofol (DIPRIVAN) 1000 MG/100ML infusion  0-50 mcg/kg/min Intravenous Continuous Belue,  Jimmye Norman, RPH       sodium chloride 0.9 % bolus 1,000 mL  1,000 mL Intravenous Once Lendon Collar, MD       And   sodium chloride 0.9 % bolus 1,000 mL  1,000 mL Intravenous Once Irean Hong, MD  vancomycin (VANCOCIN) IVPB 1000 mg/200 mL premix  1,000 mg Intravenous Once Irean Hong, MD        Allergies as of 01/26/2021 - Review Complete 01/26/2021  Allergen Reaction Noted   Indapamide Other (See Comments) 07/07/2014     Review of Systems:     Unable to assess due to mental status   Physical Exam:  Vital signs in last 24 hours: Temp:  [93.3 F (34.1 C)-94.6 F (34.8 C)] 94.3 F (34.6 C) (12/29 0645) Pulse Rate:  [26] 26 (12/29 0400) Resp:  [18-26] 22 (12/29 0645) BP: (59-119)/(28-93) 119/63 (12/29 0645) SpO2:  [86 %-100 %] 93 % (12/29 0539) FiO2 (%):  [40 %-100 %] 100 % (12/29 0600) Weight:  [56 kg] 56 kg (12/29 0600)   General:   Intubated Head:  Normocephalic and atraumatic. Eyes:   No icterus.   Conjunctiva pink. Mouth: Mucosa pink moist, no lesions. Neck:  Supple; no masses felt Lungs:  ventilated Heart:  slightly tachycardic Abdomen:   Flat, soft, nondistended, nontender Msk:   Symmetrical without gross deformities. Neurologic:  Sedated Skin:  Warm, dry, pink without significant lesions or rashes. Psych:  unable to assess  LAB RESULTS: Recent Labs    01/26/21 0403 01/26/21 0447  WBC 18.6*  --   HGB 3.8* 3.0*  HCT 13.7* 11.3*  PLT 491*  --    BMET Recent Labs    01/26/21 0403  NA 138  K 4.2  CL 106  CO2 10*  GLUCOSE 484*  BUN 31*  CREATININE 1.23*  CALCIUM 8.8*   LFT Recent Labs    01/26/21 0403  PROT 4.6*  ALBUMIN 2.2*  AST 23  ALT 13  ALKPHOS 46  BILITOT 0.5   PT/INR Recent Labs    01/26/21 0447  LABPROT 20.7*  INR 1.8*    STUDIES: CT ANGIO HEAD NECK W WO CM  Result Date: 01/26/2021 CLINICAL DATA:  Cardiopulmonary collapse with syncope. EXAM: CT ANGIOGRAPHY HEAD AND NECK TECHNIQUE: Multidetector CT imaging of the head  and neck was performed using the standard protocol during bolus administration of intravenous contrast. Multiplanar CT image reconstructions and MIPs were obtained to evaluate the vascular anatomy. Carotid stenosis measurements (when applicable) are obtained utilizing NASCET criteria, using the distal internal carotid diameter as the denominator. CONTRAST:  OMNIPAQUE IOHEXOL 350 MG/ML SOLN COMPARISON:  None. FINDINGS: CT HEAD FINDINGS Brain: No evidence of acute infarction, hemorrhage, hydrocephalus, extra-axial collection or mass lesion/mass effect. Vascular: See below Skull: Normal. Negative for fracture or focal lesion. Sinuses: Negative Orbits: Negative Review of the MIP images confirms the above findings CTA NECK FINDINGS Aortic arch: Atheromatous plaque with 3 vessel branching. Right carotid system: Narrow appearance of the carotid arteries which is symmetric. Mild atheromatous plaque at the bifurcation. No ulceration or dissection. Left carotid system: Symmetric diffuse attenuated appearance without focal stenosis, dissection, or beading Vertebral arteries: Proximal subclavian wall thickening which appears atheromatous. No flow limiting stenosis or dissection. There few areas of question luminal irregularity/beading but this is attributed artifact based on reformats. Skeleton: Cervical spine degeneration especially affecting the facets Other neck: No acute finding Upper chest: Focal cylindrically bronchiectasis in the left upper lobe. Review of the MIP images confirms the above findings CTA HEAD FINDINGS Anterior circulation: Atheromatous plaque on the carotid siphons. No branch occlusion, beading, or aneurysm. Diffusely attenuated vessels which may be from cardiac output in this setting. Posterior circulation: Vertebral and basilar arteries are smoothly contoured and widely patent. No branch occlusion, beading,  or aneurysm. Venous sinuses: Diffusely patent Anatomic variants: None significant Review of  the MIP images confirms the above findings IMPRESSION: No emergent finding.  Mild atherosclerosis. Electronically Signed   By: Tiburcio Pea M.D.   On: 01/26/2021 05:33   DG Abdomen 1 View  Result Date: 01/26/2021 CLINICAL DATA:  Orogastric tube placement EXAM: ABDOMEN - 1 VIEW COMPARISON:  None. FINDINGS: Enteric tube with tip at the GE junction. Diffuse lucency of the mid to upper abdomen. The lung bases are clear and heart size is normal. These results were called by telephone at the time of interpretation on 01/26/2021 at 4:33 am to provider JADE SUNG , who verbally acknowledged these results. IMPRESSION: 1. Unexplained lucency over the abdomen, possible large volume pneumoperitoneum. There is pending abdominal CT. 2. Enteric tube with tip at the GE junction and side port at the lower esophagus. Electronically Signed   By: Tiburcio Pea M.D.   On: 01/26/2021 04:33   DG Chest Port 1 View  Result Date: 01/26/2021 CLINICAL DATA:  Intubation. EXAM: PORTABLE CHEST 1 VIEW COMPARISON:  None. FINDINGS: Endotracheal tube with tip 9 cm above the carina, projecting at the T1 level. Enteric tube with tip at the GE junction and side-port over the lower esophagus. There is pending neck CT which will re-evaluate the position of the hardware. Normal heart size and mediastinal contours. There is no edema, consolidation, effusion, or pneumothorax. IMPRESSION: 1. Endotracheal tube with the tip at T1. Would need 3 cm of advancement to place the tip at the clavicular heads. 2. Enteric tube with tip at the GE junction. 3. No evidence of active cardiopulmonary disease. Electronically Signed   By: Tiburcio Pea M.D.   On: 01/26/2021 04:31   CT Angio Chest/Abd/Pel for Dissection W and/or W/WO  Result Date: 01/26/2021 CLINICAL DATA:  Syncope with cardiopulmonary arrest EXAM: CT ANGIOGRAPHY CHEST, ABDOMEN AND PELVIS TECHNIQUE: Non-contrast CT of the chest was initially obtained but there is blood pool contrast from  prior CTA. Multidetector CT imaging through the chest, abdomen and pelvis was performed using the standard protocol during bolus administration of intravenous contrast. Multiplanar reconstructed images and MIPs were obtained and reviewed to evaluate the vascular anatomy. CONTRAST:  OMNIPAQUE IOHEXOL 350 MG/ML SOLN COMPARISON:  None available FINDINGS: CTA CHEST FINDINGS Cardiovascular: Preferential opacification of the thoracic aorta. No evidence of thoracic aortic aneurysm or dissection. Normal heart size. No pericardial effusion. Atheromatous wall thickening of the aorta. Mediastinum/Nodes: No hematoma or pneumomediastinum. The enteric tube traverses the non thickened esophagus. Lungs/Pleura: Improved endotracheal tube positioning from prior radiography, tip near the clavicular heads. Focal cylindrical bronchiectasis to a limited degree in the left upper lobe. There is no edema, consolidation, effusion, or pneumothorax. Musculoskeletal: No acute or aggressive finding. Review of the MIP images confirms the above findings. CTA ABDOMEN AND PELVIS FINDINGS VASCULAR Aorta: Atheromatous wall thickening of the aorta. No aneurysm or dissection Celiac: Narrow vessels likely from shot mediated constriction. The GDA and gastroepiploic artery are in close proximity to the ulcer base. No active bleeding or pseudoaneurysm is seen SMA: Diffusely attenuated appearance likely from vaso constriction. Replaced right hepatic artery. Renals: Unremarkable IMA: Patent Inflow: Atheromatous plaque with diffuse constricted appearance. Veins: No venous phase.  No significant finding. Review of the MIP images confirms the above findings. NON-VASCULAR Hepatobiliary: Probable hepatic steatosis, but certainty is diminished by contrast timing. Collapsed gallbladder. Gallbladder fossa fat edema is attributed to adjacent inflammation Pancreas: Atrophic fatty infiltration Spleen: Negative Adrenals/Urinary Tract: Unremarkable kidneys.  Foley  catheter in good position. Stomach/Bowel: Fluid dilated stomach with high-density material in the proximal lumen. There is an indistinguishable wall at the duodenal bulb with medially and posteriorly directed ulcer broadly excavating along the pancreaticoduodenal groove. Frothy material in at the level of the ulcer. Lymphatic: Negative for adenopathy mass. Reproductive: Hysterectomy Other: No ascites or pneumoperitoneum Musculoskeletal: Remote L1 compression fracture with cement augmentation. Review of the MIP images confirms the above findings. IMPRESSION: Large and deep ulceration at the duodenal bulb, usually peptic but given the size, ulcerated tumor is also considered. The GDA and gastroepiploic arteries are at the ulcer base and there is suspected blood clot within the dilated stomach, please correlate with NG tube aspiration. No evidence of active bleeding. No pneumoperitoneum. Electronically Signed   By: Tiburcio Pea M.D.   On: 01/26/2021 05:44       Impression / Plan:   65 y/o lady with arthritis on chronic NSAIDS who presented with syncope requiring intubation and found to have anemia and large duodenal bulb ulcer with GDA at base of ulcer with no active bleeding.  - will need to be resuscitated with pRBC's with goal hemoglobin > 7 - two large bore IV's - IV protonix gtt - no heparin products - IV fluids - will order one time dose of reglan to clear stomach - will plan on EGD later today was adequately resuscitated - please call if develops hemodynamically significant overt GI bleeding and is not responding to resuscitation with pRBC's - position of ulcer and possible involvement of GDA makes it difficult ulcer to treat, in case of need for potential need of intervascular intervention may need to touch base with vascular surgery to follow along  Please call if any concerns or questions.  Merlyn Lot MD, MPH [facility]

## 2021-01-26 NOTE — ED Notes (Signed)
7.5 ET tube. 18 at the teeth

## 2021-01-26 NOTE — Sepsis Progress Note (Signed)
Following per sepsis protocol   

## 2021-01-26 NOTE — Consult Note (Signed)
PHARMACY CONSULT NOTE - FOLLOW UP  Pharmacy Consult for Electrolyte Monitoring and Replacement   Recent Labs: Potassium (mmol/L)  Date Value  01/26/2021 4.2  08/12/2011 4.4   Magnesium (mg/dL)  Date Value  14/78/2956 2.3  08/11/2011 2.2   Calcium (mg/dL)  Date Value  21/30/8657 8.8 (L)   Calcium, Total (mg/dL)  Date Value  84/69/6295 8.5   Albumin (g/dL)  Date Value  28/41/3244 2.2 (L)  01/01/2018 4.6  08/10/2011 4.2   Phosphorus (mg/dL)  Date Value  01/31/7251 1.6 (L)   Sodium (mmol/L)  Date Value  01/26/2021 138  01/01/2018 136  08/12/2011 138     Assessment: 65yo female with PMH of Anxiety, Asthma, Depression, Hyperlipidemia, Hypertension, and IBS who was admitted to the hospital for syncope. Pharmacy was consulted for electrolyte monitoring and replacement.   MIVF: Sosium bicarb in D5 @250mL /hr   Goal of Therapy:  Electrolytes WNL  Plan:  --No replacement is currently indicated  --Will continue to monitor and replace electrolytes as clinically indicated   , PharmD Pharmacy Resident  01/26/2021 12:37 PM

## 2021-01-26 NOTE — ED Provider Notes (Signed)
[facility] Emergency Department Provider Note   ____________________________________________   Event Date/Time   First MD Initiated Contact with Patient 01/26/21 0406     (approximate)  I have reviewed the triage vital signs and the nursing notes.   HISTORY  Chief Complaint Loss of Consciousness  Level of V caveat: Limited by unresponsive  HPI Angela Espinoza is a 65 y.o. female brought to the ED from home by her husband with a chief complaint of syncope.  Patient was pulled out of the car and found to be not breathing without pulse.  She was brought back immediately to a treatment room.  CPR was initiated.  Patient regained ROSC after brief period of CPR.  She was able to regain consciousness enough to deny recent illness, headache, chest pain, abdominal pain, vomiting or diarrhea.  Remained with shallow breathing, and in and out of consciousness.  Rest of history is limited secondary to patient's distress.     Past Medical History:  Diagnosis Date   Anxiety    Asthma    NO INHALER-SMOKE AND PERFUME TRIGGERS ASTHMA   Depression    Hyperlipidemia    Hypertension    IBS (irritable bowel syndrome)     Patient Active Problem List   Diagnosis Date Noted   Syncope and collapse 01/26/2021   Other intervertebral disc degeneration, thoracic region 12/27/2020   Other intervertebral disc degeneration, lumbar region 12/27/2020   Abnormal mammogram of left breast 09/24/2019   Acid reflux 12/03/2018   Neurogenic pain 11/03/2018   Thoracic facet syndrome (Left) 09/30/2018   Spondylosis without myelopathy or radiculopathy, lumbosacral region 09/30/2018   Spondylosis without myelopathy or radiculopathy, thoracic region 09/30/2018   DDD (degenerative disc disease), thoracolumbar 09/30/2018   DDD (degenerative disc disease), lumbosacral 07/24/2018   History of L1 kyphoplasty 01/01/2018   Traumatic compression fracture of L1 lumbar vertebra, sequela  (2016) 01/01/2018   Spondylosis without myelopathy or radiculopathy, lumbar region 01/01/2018   Lumbar facet syndrome (Left) 01/01/2018   Allergy 12/10/2017   Anxiety 12/10/2017   Asthma without status asthmaticus 12/10/2017   Depression 12/10/2017   Hypertension 12/10/2017   Chronic low back pain (1ry area of Pain) (Left) w/o sciatica 12/10/2017   Chronic pain syndrome 12/10/2017   Pharmacologic therapy 12/10/2017   Disorder of skeletal system 12/10/2017   Problems influencing health status 12/10/2017   Postmenopause 06/02/2014    Past Surgical History:  Procedure Laterality Date   ABDOMINAL HYSTERECTOMY  age 48   AXILLARY LYMPH NODE BIOPSY Right 08/03/2014   Procedure: EXCISION RIGHT  AXILLARY LIPOMATOSIS;  Surgeon: Leonie Green, MD;  Location: [facility] ORS;  Service: General;  Laterality: Right;   BREAST BIOPSY Left 10/07/2018   Stereo, Coil Clip, BENIGN BREAST TISSUE WITH AREAS CONTAINING A PREDOMINANCE OF ADIPOSE TISSUE   BREAST BIOPSY Left 10/21/2019   stereo, ribbon clip, path pending    COLONOSCOPY     EXPLORATORY LAPAROTOMY     FOOT SURGERY     KYPHOPLASTY N/A 12/10/2014   Procedure: KYPHOPLASTY L 1;  Surgeon: Hessie Knows, MD;  Location: [facility] ORS;  Service: Orthopedics;  Laterality: N/A;   OOPHORECTOMY     TONSILLECTOMY      Prior to Admission medications   Medication Sig Start Date End Date Taking? Authorizing Provider  acetaminophen (TYLENOL) 500 MG tablet Take 500 mg by mouth every 6 (six) hours as needed.    [provider]  Biotin 1000 MCG CHEW Chew 1,000 mcg by mouth daily.  [provider]  BLACK ELDERBERRY PO Take 1 capsule by mouth daily.     [provider]  Black Pepper-Turmeric 3-500 MG CAPS Take by mouth.    [provider]  Calcium Carbonate-Vitamin D (CALCIUM 500 + D PO) Take 1 tablet by mouth daily. 08/18/18   [provider]  DULoxetine (CYMBALTA) 30 MG capsule Take 30 mg by mouth 2 (two) times daily.     [provider]  gabapentin (NEURONTIN) 300 MG capsule Take 300 mg by mouth 3 (three) times daily.    [provider]  losartan (COZAAR) 25 MG tablet Take 25 mg by mouth every morning.    [provider]  meloxicam (MOBIC) 15 MG tablet Take 15 mg by mouth daily. 03/20/20   [provider]  Multiple Vitamin (MULTIVITAMIN) capsule Take 1 capsule by mouth daily.    [provider]  Omega-3 Fatty Acids (FISH OIL PO) Take 1 tablet by mouth daily.    [provider]  vitamin C (ASCORBIC ACID) 500 MG tablet Take 1,000 mg by mouth daily.     [provider]    Allergies Indapamide  Family History  Problem Relation Age of Onset   Heart attack Mother    Lung cancer Father    Breast cancer Neg Hx     Social History Social History   Tobacco Use   Smoking status: Never    Passive exposure: Yes   Smokeless tobacco: Never   Tobacco comments:    Parents were smokers   Vaping Use   Vaping Use: Never used  Substance Use Topics   Alcohol use: Yes    Comment: occ-wine   Drug use: No    Review of Systems  Constitutional: Positive for unresponsive state.  No fever/chills Eyes: No visual changes. ENT: No sore throat. Cardiovascular: Positive for pulseless.  Denies chest pain. Respiratory: Positive for no respiratory effort.  Denies shortness of breath. Gastrointestinal: No abdominal pain.  No nausea, no vomiting.  No diarrhea.  No constipation. Genitourinary: Negative for dysuria. Musculoskeletal: Negative for back pain. Skin: Negative for rash. Neurological: Negative for headaches, focal weakness or numbness.   ____________________________________________   PHYSICAL EXAM:  VITAL SIGNS: ED Triage Vitals  Enc Vitals Group     BP      Pulse      Resp      Temp      Temp src      SpO2      Weight      Height      Head Circumference      Peak Flow      Pain Score      Pain Loc      Pain Edu?      Excl. in [place]?      Constitutional: Unresponsive.  Cool, pale, cyanotic, mottled.   Eyes: Conjunctivae are normal. PERRL, sluggish and minimally reactive. EOMI. Head: Atraumatic. Nose: No congestion/rhinnorhea. Mouth/Throat: Mucous membranes are dry.   Neck: No stridor.   Cardiovascular: Pulseless. Respiratory: No respiratory effort. Gastrointestinal: Soft, no distention. No abdominal bruits.  Musculoskeletal: No lower extremity deformity nor edema.  No joint effusions. Neurologic: Unresponsive.   Skin:  Skin is cool, mottled, cyanotic, dry and intact. No rash noted. Psychiatric: Unable to assess. ____________________________________________   LABS (all labs ordered are listed, but only abnormal results are displayed)  Labs Reviewed  BLOOD GAS, ARTERIAL - Abnormal; Notable for the following components:      Result  Value   pH, Arterial 6.91 (*)    pCO2 arterial 28 (*)    pO2, Arterial 454 (*)    Bicarbonate 5.6 (*)    Acid-base deficit 24.5 (*)    All other components within normal limits  CBC WITH DIFFERENTIAL/PLATELET - Abnormal; Notable for the following components:   WBC 18.6 (*)    RBC 1.34 (*)    Hemoglobin 3.8 (*)    HCT 13.7 (*)    MCV 102.2 (*)    MCHC 27.7 (*)    Platelets 491 (*)    Neutro Abs 10.4 (*)    Lymphs Abs 6.8 (*)    Abs Immature Granulocytes 0.41 (*)    All other components within normal limits  COMPREHENSIVE METABOLIC PANEL - Abnormal; Notable for the following components:   CO2 10 (*)    Glucose, Bld 484 (*)    BUN 31 (*)    Creatinine, Ser 1.23 (*)    Calcium 8.8 (*)    Total Protein 4.6 (*)    Albumin 2.2 (*)    GFR, Estimated 48 (*)    Anion gap 22 (*)    All other components within normal limits  LACTIC ACID, PLASMA - Abnormal; Notable for the following components:   Lactic Acid, Venous >9.0 (*)    All other components within normal limits  PROTIME-INR - Abnormal; Notable for the following components:   Prothrombin Time 20.7 (*)    INR 1.8 (*)     All other components within normal limits  HEMOGLOBIN AND HEMATOCRIT, BLOOD - Abnormal; Notable for the following components:   Hemoglobin 3.0 (*)    HCT 11.3 (*)    All other components within normal limits  TROPONIN I (HIGH SENSITIVITY) - Abnormal; Notable for the following components:   Troponin I (High Sensitivity) 31 (*)    All other components within normal limits  RESP PANEL BY RT-PCR (FLU A&B, COVID) ARPGX2  CULTURE, BLOOD (ROUTINE X 2)  CULTURE, BLOOD (ROUTINE X 2)  URINE CULTURE  LIPASE, BLOOD  LACTIC ACID, PLASMA  URINALYSIS, ROUTINE W REFLEX MICROSCOPIC  PROCALCITONIN  TYPE AND SCREEN  PREPARE RBC (CROSSMATCH)  ABO/RH  TROPONIN I (HIGH SENSITIVITY)   ____________________________________________  EKG  ED ECG REPORT I, Ria Redcay J, the attending physician, personally viewed and interpreted this ECG.   Date: 01/26/2021  EKG Time: 0356  Rate: 89  Rhythm: normal sinus rhythm  Axis: Normal  Intervals:none  ST&T Change: Nonspecific  ____________________________________________  RADIOLOGY I, Kyce Ging J, personally viewed and evaluated these images (plain radiographs) as part of my medical decision making, as well as reviewing the written report by the radiologist.  ED MD interpretation: Chest x-ray demonstrates ET tube shallow, will advance; KUB shows enteric tube with tip at the GE junction, will advance; CTA head/neck unremarkable; CTA chest/abdomen/pelvis demonstrates duodenal ulcer versus tumor without hemoperitoneum  Official radiology report(s): CT ANGIO HEAD NECK W WO CM  Result Date: 01/26/2021 CLINICAL DATA:  Cardiopulmonary collapse with syncope. EXAM: CT ANGIOGRAPHY HEAD AND NECK TECHNIQUE: Multidetector CT imaging of the head and neck was performed using the standard protocol during bolus administration of intravenous contrast. Multiplanar CT image reconstructions and MIPs were obtained to evaluate the vascular anatomy. Carotid stenosis measurements  (when applicable) are obtained utilizing NASCET criteria, using the distal internal carotid diameter as the denominator. CONTRAST:  158mL OMNIPAQUE IOHEXOL 350 MG/ML SOLN COMPARISON:  None. FINDINGS: CT HEAD FINDINGS Brain: No evidence of acute infarction, hemorrhage, hydrocephalus, extra-axial collection or mass  lesion/mass effect. Vascular: See below Skull: Normal. Negative for fracture or focal lesion. Sinuses: Negative Orbits: Negative Review of the MIP images confirms the above findings CTA NECK FINDINGS Aortic arch: Atheromatous plaque with 3 vessel branching. Right carotid system: Narrow appearance of the carotid arteries which is symmetric. Mild atheromatous plaque at the bifurcation. No ulceration or dissection. Left carotid system: Symmetric diffuse attenuated appearance without focal stenosis, dissection, or beading Vertebral arteries: Proximal subclavian wall thickening which appears atheromatous. No flow limiting stenosis or dissection. There few areas of question luminal irregularity/beading but this is attributed artifact based on reformats. Skeleton: Cervical spine degeneration especially affecting the facets Other neck: No acute finding Upper chest: Focal cylindrically bronchiectasis in the left upper lobe. Review of the MIP images confirms the above findings CTA HEAD FINDINGS Anterior circulation: Atheromatous plaque on the carotid siphons. No branch occlusion, beading, or aneurysm. Diffusely attenuated vessels which may be from cardiac output in this setting. Posterior circulation: Vertebral and basilar arteries are smoothly contoured and widely patent. No branch occlusion, beading, or aneurysm. Venous sinuses: Diffusely patent Anatomic variants: None significant Review of the MIP images confirms the above findings IMPRESSION: No emergent finding.  Mild atherosclerosis. Electronically Signed   By: Tiburcio Pea M.D.   On: 01/26/2021 05:33   DG Abdomen 1 View  Result Date:  01/26/2021 CLINICAL DATA:  Orogastric tube placement EXAM: ABDOMEN - 1 VIEW COMPARISON:  None. FINDINGS: Enteric tube with tip at the GE junction. Diffuse lucency of the mid to upper abdomen. The lung bases are clear and heart size is normal. These results were called by telephone at the time of interpretation on 01/26/2021 at 4:33 am to provider D'Arcy Abraha , who verbally acknowledged these results. IMPRESSION: 1. Unexplained lucency over the abdomen, possible large volume pneumoperitoneum. There is pending abdominal CT. 2. Enteric tube with tip at the GE junction and side port at the lower esophagus. Electronically Signed   By: Tiburcio Pea M.D.   On: 01/26/2021 04:33   DG Chest Port 1 View  Result Date: 01/26/2021 CLINICAL DATA:  Intubation. EXAM: PORTABLE CHEST 1 VIEW COMPARISON:  None. FINDINGS: Endotracheal tube with tip 9 cm above the carina, projecting at the T1 level. Enteric tube with tip at the GE junction and side-port over the lower esophagus. There is pending neck CT which will re-evaluate the position of the hardware. Normal heart size and mediastinal contours. There is no edema, consolidation, effusion, or pneumothorax. IMPRESSION: 1. Endotracheal tube with the tip at T1. Would need 3 cm of advancement to place the tip at the clavicular heads. 2. Enteric tube with tip at the GE junction. 3. No evidence of active cardiopulmonary disease. Electronically Signed   By: Tiburcio Pea M.D.   On: 01/26/2021 04:31   CT Angio Chest/Abd/Pel for Dissection W and/or W/WO  Result Date: 01/26/2021 CLINICAL DATA:  Syncope with cardiopulmonary arrest EXAM: CT ANGIOGRAPHY CHEST, ABDOMEN AND PELVIS TECHNIQUE: Non-contrast CT of the chest was initially obtained but there is blood pool contrast from prior CTA. Multidetector CT imaging through the chest, abdomen and pelvis was performed using the standard protocol during bolus administration of intravenous contrast. Multiplanar reconstructed images and MIPs  were obtained and reviewed to evaluate the vascular anatomy. CONTRAST:  OMNIPAQUE IOHEXOL 350 MG/ML SOLN COMPARISON:  None available FINDINGS: CTA CHEST FINDINGS Cardiovascular: Preferential opacification of the thoracic aorta. No evidence of thoracic aortic aneurysm or dissection. Normal heart size. No pericardial effusion. Atheromatous wall thickening of the aorta.  Mediastinum/Nodes: No hematoma or pneumomediastinum. The enteric tube traverses the non thickened esophagus. Lungs/Pleura: Improved endotracheal tube positioning from prior radiography, tip near the clavicular heads. Focal cylindrical bronchiectasis to a limited degree in the left upper lobe. There is no edema, consolidation, effusion, or pneumothorax. Musculoskeletal: No acute or aggressive finding. Review of the MIP images confirms the above findings. CTA ABDOMEN AND PELVIS FINDINGS VASCULAR Aorta: Atheromatous wall thickening of the aorta. No aneurysm or dissection Celiac: Narrow vessels likely from shot mediated constriction. The GDA and gastroepiploic artery are in close proximity to the ulcer base. No active bleeding or pseudoaneurysm is seen SMA: Diffusely attenuated appearance likely from vaso constriction. Replaced right hepatic artery. Renals: Unremarkable IMA: Patent Inflow: Atheromatous plaque with diffuse constricted appearance. Veins: No venous phase.  No significant finding. Review of the MIP images confirms the above findings. NON-VASCULAR Hepatobiliary: Probable hepatic steatosis, but certainty is diminished by contrast timing. Collapsed gallbladder. Gallbladder fossa fat edema is attributed to adjacent inflammation Pancreas: Atrophic fatty infiltration Spleen: Negative Adrenals/Urinary Tract: Unremarkable kidneys. Foley catheter in good position. Stomach/Bowel: Fluid dilated stomach with high-density material in the proximal lumen. There is an indistinguishable wall at the duodenal bulb with medially and posteriorly directed  ulcer broadly excavating along the pancreaticoduodenal groove. Frothy material in at the level of the ulcer. Lymphatic: Negative for adenopathy mass. Reproductive: Hysterectomy Other: No ascites or pneumoperitoneum Musculoskeletal: Remote L1 compression fracture with cement augmentation. Review of the MIP images confirms the above findings. IMPRESSION: Large and deep ulceration at the duodenal bulb, usually peptic but given the size, ulcerated tumor is also considered. The GDA and gastroepiploic arteries are at the ulcer base and there is suspected blood clot within the dilated stomach, please correlate with NG tube aspiration. No evidence of active bleeding. No pneumoperitoneum. Electronically Signed   By: Jorje Guild M.D.   On: 01/26/2021 05:44    ____________________________________________   PROCEDURES  Procedure(s) performed (including Critical Care):  Procedure Name: Intubation Date/Time: 01/26/2021 4:24 AM Performed by: Paulette Blanch, MD Pre-anesthesia Checklist: Patient identified, Emergency Drugs available, Suction available and Patient being monitored Oxygen Delivery Method: Ambu bag Preoxygenation: Pre-oxygenation with 100% oxygen Induction Type: IV induction and Rapid sequence Ventilation: Mask ventilation without difficulty Laryngoscope Size: Glidescope and 3 Grade View: Grade I Tube size: 7.5 mm Number of attempts: 1 Airway Equipment and Method: Rigid stylet Placement Confirmation: ETT inserted through vocal cords under direct vision, CO2 detector and Breath sounds checked- equal and bilateral Dental Injury: Teeth and Oropharynx as per pre-operative assessment     .1-3 Lead EKG Interpretation Performed by: Paulette Blanch, MD Authorized by: Paulette Blanch, MD     Interpretation: normal     ECG rate:  89   ECG rate assessment: normal     Rhythm: sinus rhythm     Ectopy: none     Conduction: normal   Comments:     Patient placed on cardiac monitor to evaluate for  arrhythmias  CRITICAL CARE Performed by: Paulette Blanch   Total critical care time: 60 minutes  Critical care time was exclusive of separately billable procedures and treating other patients.  Critical care was necessary to treat or prevent imminent or life-threatening deterioration.  Critical care was time spent personally by me on the following activities: development of treatment plan with patient and/or surrogate as well as nursing, discussions with consultants, evaluation of patient's response to treatment, examination of patient, obtaining history from patient or surrogate, ordering and performing treatments  and interventions, ordering and review of laboratory studies, ordering and review of radiographic studies, pulse oximetry and re-evaluation of patient's condition.  ____________________________________________   INITIAL IMPRESSION / ASSESSMENT AND PLAN / ED COURSE  As part of my medical decision making, I reviewed the following data within the [place] History obtained from family, Nursing notes reviewed and incorporated, Labs reviewed, EKG interpreted, Old chart reviewed, Radiograph reviewed, Discussed with admitting physician, and Notes from prior ED visits     65 year old female brought for syncope.  Pulled out of the car pulseless with no respiratory effort.  ROSC after 1 round of CPR.  Intubated for airway protection.  Will obtain sepsis protocol lab work, CTA head, neck, chest/abdomen/pelvis.  Anticipate hospitalization.  Will obtain further history once spouse arrives.  Clinical Course as of 01/26/21 [id number]  Thu Jan 26, 2021  0444 Lactate > 9; initiate ED code sepsis protocol.  Patient found to have dark, tarry stool which is immediately guaiac positive.  Will administer IV Protonix bolus with drip. [JS]  [id number] Discussed with spouse who states patient has not been feeling well.  Vomited yesterday.  Thought she was just dehydrated.  Has been drinking a new  drink with unintentional 20 pound weight loss over the past 3 weeks.  This morning at 3 AM she woke him up to take her to the restroom and that is when she had a syncopal episode and collapsed.  Updated spouse on lab results so far; he consents to blood transfusion.  Have discussed with intensivist NP [place] who is at bedside. [JS]    Clinical Course User Index [JS] Paulette Blanch, MD     ____________________________________________   FINAL CLINICAL IMPRESSION(S) / ED DIAGNOSES  Final diagnoses:  Syncope and collapse  Cardiopulmonary arrest with successful resuscitation ([place])  Hypothermia, initial encounter  Hypotension, unspecified hypotension type  Sepsis, due to unspecified organism, unspecified whether acute organ dysfunction present ([place])  Upper GI bleed  Anemia, unspecified type  AKI (acute kidney injury) ([place])  Peptic ulcer disease     ED Discharge Orders     None        Note:  This document was prepared using Dragon voice recognition software and may include unintentional dictation errors.    Paulette Blanch, MD 01/26/21 [phone]

## 2021-01-26 NOTE — Progress Notes (Signed)
CODE SEPSIS - PHARMACY COMMUNICATION  **Broad Spectrum Antibiotics should be administered within 1 hour of Sepsis diagnosis**  Time Code Sepsis Called/Page Received: 9702  Antibiotics Ordered: Cefepime, Vancomycin, Flagyl  Time of 1st antibiotic administration: 0800  Additional action taken by pharmacy: Sent [place] to RN.  Pt is being intubated and started on pressors, prior to any abx being hung.  Otelia Sergeant, PharmD, [facility] 01/26/2021 4:48 AM

## 2021-01-26 NOTE — H&P (Signed)
Name: Angela Espinoza MRN: [id number] DOB: 1955/06/07     CONSULTATION DATE: 01/26/2021  REFERRING MD :  Beather Arbour  CHIEF COMPLAINT:  Syncope  STUDIES:  CT abd with suggestion duodenal bleed   HISTORY OF PRESENT ILLNESS:   65 yo WF presented via private vehicle to the ED after having several syncopal episodes in as many days. Last night she developed diaphoresis and increased tone/stiffness, which prompted her husband to bring her to the ED. She again had syncope on arrival to the ED, and indeed was thought to be in PEA while still in the car. CPR was initiated, but within 15 seconds the patient was moving. She did require intubation. She was notably pale and indeed her presenting hemoglobin was 3.8 with a follow-up of 3.0 about 83mn later. There was not overt melena or hematochezia to account for this. A CT abd was performed revealed a large and deep ulceration at the duodenal bulb, usually peptic, ulcerated tumor is also considered.The GDA and gastroepiploic arteries are at the ulcer base and there is suspected blood clot within the dilated stomach. Her history is notable for Mobic. Two units of PRBC were transfused prior to her arrival in the ICU. Two more are pending. Levophed was started.   ER Course: CPR x15s50 ETT/MV Levophed 10 4 units of blood ordered, 2 transfused (finishing on arrival to ICU)  PAST MEDICAL HISTORY :   has a past medical history of Anxiety, Asthma, Depression, Hyperlipidemia, Hypertension, and IBS (irritable bowel syndrome).  has a past surgical history that includes Oophorectomy; Exploratory laparotomy; Foot surgery; Colonoscopy; Tonsillectomy; Axillary lymph node biopsy (Right, 08/03/2014); Kyphoplasty (N/A, 12/10/2014); Abdominal hysterectomy (age 65; Breast biopsy (Left, 10/07/2018); and Breast biopsy (Left, 10/21/2019). Prior to Admission medications   Medication Sig Start Date End Date Taking? Authorizing Provider  acetaminophen (TYLENOL) 500 MG  tablet Take 500 mg by mouth every 6 (six) hours as needed.    [provider]  Biotin 1000 MCG CHEW Chew 1,000 mcg by mouth daily.     [provider]  BLACK ELDERBERRY PO Take 1 capsule by mouth daily.     [provider]  Black Pepper-Turmeric 3-500 MG CAPS Take by mouth.    [provider]  Calcium Carbonate-Vitamin D (CALCIUM 500 + D PO) Take 1 tablet by mouth daily. 08/18/18   [provider]  DULoxetine (CYMBALTA) 30 MG capsule Take 30 mg by mouth 2 (two) times daily.    [provider]  gabapentin (NEURONTIN) 300 MG capsule Take 300 mg by mouth 3 (three) times daily.    [provider]  losartan (COZAAR) 25 MG tablet Take 25 mg by mouth every morning.    [provider]  meloxicam (MOBIC) 15 MG tablet Take 15 mg by mouth daily. 03/20/20   [provider]  Multiple Vitamin (MULTIVITAMIN) capsule Take 1 capsule by mouth daily.    [provider]  Omega-3 Fatty Acids (FISH OIL PO) Take 1 tablet by mouth daily.    [provider]  SENNA PLUS 8.6-50 MG tablet Take 1 tablet by mouth daily. 01/19/21   [provider]  vitamin C (ASCORBIC ACID) 500 MG tablet Take 1,000 mg by mouth daily.     [provider]   Allergies  Allergen Reactions   Indapamide Other (See Comments)    tingling all over body    FAMILY HISTORY:  family history includes Heart attack in her mother; Lung cancer in her father. SOCIAL HISTORY:  reports that she has never smoked. She has been exposed to tobacco smoke. She has never used smokeless tobacco. She reports current alcohol use. She reports that she does not use drugs.  REVIEW OF SYSTEMS:   Unable to obtain due to critical illness    Estimated body mass index is 22.58 kg/m as calculated from the following:   Height as of 12/27/20: 5' 2"  (1.575 m).   Weight as of this encounter: 56 kg.    VITAL SIGNS: Temp:  [93.3 F (34.1 C)-94.6 F (34.8  C)] 94.3 F (34.6 C) (12/29 0645) Pulse Rate:  [26] 26 (12/29 0400) Resp:  [18-26] 22 (12/29 0645) BP: (59-119)/(28-93) 119/63 (12/29 0645) SpO2:  [86 %-100 %] 93 % (12/29 0539) FiO2 (%):  [40 %-100 %] 100 % (12/29 0600) Weight:  [56 kg] 56 kg (12/29 0600)   No intake/output data recorded. No intake/output data recorded.   SpO2: 93 % FiO2 (%): (S) 100 %   Physical Examination:  GENERAL:critically ill appearing, HEAD: Normocephalic, atraumatic.  EYES: Pupils equal, round, reactive to light.  No scleral icterus. Lips pale MOUTH: Moist mucosal membrane. NECK: Supple. No JVD.  PULMONARY: ETT/MV mild tachypnea CARDIOVASCULAR: S1 and S2. Tachy Regular rate and rhythm. No murmurs, rubs, or gallops.  GASTROINTESTINAL: Soft, nontender(but sedated), -distended.   MUSCULOSKELETAL: No swelling, clubbing, or edema.  NEUROLOGIC: sedated SKIN:pale, sallow   I personally reviewed lab work that was obtained in last 24 hrs. CXR Independently reviewed-  MEDICATIONS: I have reviewed all medications and confirmed regimen as documented   CULTURE RESULTS   Recent Results (from the past 240 hour(s))  Resp Panel by RT-PCR (Flu A&B, Covid) Nasopharyngeal Swab     Status: None   Collection Time: 01/26/21  4:03 AM   Specimen: Nasopharyngeal Swab; Nasopharyngeal(NP) swabs in vial transport medium  Result Value Ref Range Status   SARS Coronavirus 2 by RT PCR NEGATIVE NEGATIVE Final    Comment: (NOTE) SARS-CoV-2 target nucleic acids are NOT DETECTED.  The SARS-CoV-2 RNA is generally detectable in upper respiratory specimens during the acute phase of infection. The lowest concentration of SARS-CoV-2 viral copies this assay can detect is 138 copies/mL. A negative result does not preclude SARS-Cov-2 infection and should not be used as the sole basis for treatment or other patient management decisions. A negative result may occur with  improper specimen collection/handling, submission of specimen  other than nasopharyngeal swab, presence of viral mutation(s) within the areas targeted by this assay, and inadequate number of viral copies(<138 copies/mL). A negative result must be combined with clinical observations, patient history, and epidemiological information. The expected result is Negative.  Fact Sheet for Patients:  EntrepreneurPulse.com.au  Fact Sheet for Healthcare Providers:  IncredibleEmployment.be  This test is no t yet approved or cleared by the [place] FDA and  has been authorized for detection and/or diagnosis of SARS-CoV-2 by FDA under an Emergency Use Authorization (EUA). This EUA will remain  in effect (meaning this test can be used) for the duration of the COVID-19 declaration under Section 564(b)(1) of the Act, 21 U.S.C.section 360bbb-3(b)(1), unless the authorization is terminated  or revoked sooner.       Influenza A by PCR NEGATIVE NEGATIVE Final   Influenza B by PCR NEGATIVE NEGATIVE Final    Comment: (NOTE) The Xpert Xpress SARS-CoV-2/FLU/RSV plus assay is intended as an aid in the diagnosis of influenza from Nasopharyngeal swab specimens and should not be used as a sole basis for treatment. Nasal washings and  aspirates are unacceptable for Xpert Xpress SARS-CoV-2/FLU/RSV testing.  Fact Sheet for Patients: EntrepreneurPulse.com.au  Fact Sheet for Healthcare Providers: IncredibleEmployment.be  This test is not yet approved or cleared by the [place] FDA and has been authorized for detection and/or diagnosis of SARS-CoV-2 by FDA under an Emergency Use Authorization (EUA). This EUA will remain in effect (meaning this test can be used) for the duration of the COVID-19 declaration under Section 564(b)(1) of the Act, 21 U.S.C. section 360bbb-3(b)(1), unless the authorization is terminated or revoked.  Performed at [facility], [address]  [place], [place] [address]           IMAGING    CT ANGIO HEAD NECK W WO CM  Result Date: 01/26/2021 CLINICAL DATA:  Cardiopulmonary collapse with syncope. EXAM: CT ANGIOGRAPHY HEAD AND NECK TECHNIQUE: Multidetector CT imaging of the head and neck was performed using the standard protocol during bolus administration of intravenous contrast. Multiplanar CT image reconstructions and MIPs were obtained to evaluate the vascular anatomy. Carotid stenosis measurements (when applicable) are obtained utilizing NASCET criteria, using the distal internal carotid diameter as the denominator. CONTRAST:  158m OMNIPAQUE IOHEXOL 350 MG/ML SOLN COMPARISON:  None. FINDINGS: CT HEAD FINDINGS Brain: No evidence of acute infarction, hemorrhage, hydrocephalus, extra-axial collection or mass lesion/mass effect. Vascular: See below Skull: Normal. Negative for fracture or focal lesion. Sinuses: Negative Orbits: Negative Review of the MIP images confirms the above findings CTA NECK FINDINGS Aortic arch: Atheromatous plaque with 3 vessel branching. Right carotid system: Narrow appearance of the carotid arteries which is symmetric. Mild atheromatous plaque at the bifurcation. No ulceration or dissection. Left carotid system: Symmetric diffuse attenuated appearance without focal stenosis, dissection, or beading Vertebral arteries: Proximal subclavian wall thickening which appears atheromatous. No flow limiting stenosis or dissection. There few areas of question luminal irregularity/beading but this is attributed artifact based on reformats. Skeleton: Cervical spine degeneration especially affecting the facets Other neck: No acute finding Upper chest: Focal cylindrically bronchiectasis in the left upper lobe. Review of the MIP images confirms the above findings CTA HEAD FINDINGS Anterior circulation: Atheromatous plaque on the carotid siphons. No branch occlusion, beading, or aneurysm. Diffusely attenuated vessels which may be from  cardiac output in this setting. Posterior circulation: Vertebral and basilar arteries are smoothly contoured and widely patent. No branch occlusion, beading, or aneurysm. Venous sinuses: Diffusely patent Anatomic variants: None significant Review of the MIP images confirms the above findings IMPRESSION: No emergent finding.  Mild atherosclerosis. Electronically Signed   By: JJorje GuildM.D.   On: 01/26/2021 05:33   DG Abdomen 1 View  Result Date: 01/26/2021 CLINICAL DATA:  Orogastric tube placement EXAM: ABDOMEN - 1 VIEW COMPARISON:  None. FINDINGS: Enteric tube with tip at the GE junction. Diffuse lucency of the mid to upper abdomen. The lung bases are clear and heart size is normal. These results were called by telephone at the time of interpretation on 01/26/2021 at 4:33 am to provider JADE SUNG , who verbally acknowledged these results. IMPRESSION: 1. Unexplained lucency over the abdomen, possible large volume pneumoperitoneum. There is pending abdominal CT. 2. Enteric tube with tip at the GE junction and side port at the lower esophagus. Electronically Signed   By: JJorje GuildM.D.   On: 01/26/2021 04:33   DG Chest Port 1 View  Result Date: 01/26/2021 CLINICAL DATA:  Intubation. EXAM: PORTABLE CHEST 1 VIEW COMPARISON:  None. FINDINGS: Endotracheal tube with tip 9 cm above the carina, projecting at the  T1 level. Enteric tube with tip at the GE junction and side-port over the lower esophagus. There is pending neck CT which will re-evaluate the position of the hardware. Normal heart size and mediastinal contours. There is no edema, consolidation, effusion, or pneumothorax. IMPRESSION: 1. Endotracheal tube with the tip at T1. Would need 3 cm of advancement to place the tip at the clavicular heads. 2. Enteric tube with tip at the GE junction. 3. No evidence of active cardiopulmonary disease. Electronically Signed   By: Jorje Guild M.D.   On: 01/26/2021 04:31   CT Angio Chest/Abd/Pel for  Dissection W and/or W/WO  Result Date: 01/26/2021 CLINICAL DATA:  Syncope with cardiopulmonary arrest EXAM: CT ANGIOGRAPHY CHEST, ABDOMEN AND PELVIS TECHNIQUE: Non-contrast CT of the chest was initially obtained but there is blood pool contrast from prior CTA. Multidetector CT imaging through the chest, abdomen and pelvis was performed using the standard protocol during bolus administration of intravenous contrast. Multiplanar reconstructed images and MIPs were obtained and reviewed to evaluate the vascular anatomy. CONTRAST:  170m OMNIPAQUE IOHEXOL 350 MG/ML SOLN COMPARISON:  None available FINDINGS: CTA CHEST FINDINGS Cardiovascular: Preferential opacification of the thoracic aorta. No evidence of thoracic aortic aneurysm or dissection. Normal heart size. No pericardial effusion. Atheromatous wall thickening of the aorta. Mediastinum/Nodes: No hematoma or pneumomediastinum. The enteric tube traverses the non thickened esophagus. Lungs/Pleura: Improved endotracheal tube positioning from prior radiography, tip near the clavicular heads. Focal cylindrical bronchiectasis to a limited degree in the left upper lobe. There is no edema, consolidation, effusion, or pneumothorax. Musculoskeletal: No acute or aggressive finding. Review of the MIP images confirms the above findings. CTA ABDOMEN AND PELVIS FINDINGS VASCULAR Aorta: Atheromatous wall thickening of the aorta. No aneurysm or dissection Celiac: Narrow vessels likely from shot mediated constriction. The GDA and gastroepiploic artery are in close proximity to the ulcer base. No active bleeding or pseudoaneurysm is seen SMA: Diffusely attenuated appearance likely from vaso constriction. Replaced right hepatic artery. Renals: Unremarkable IMA: Patent Inflow: Atheromatous plaque with diffuse constricted appearance. Veins: No venous phase.  No significant finding. Review of the MIP images confirms the above findings. NON-VASCULAR Hepatobiliary: Probable hepatic  steatosis, but certainty is diminished by contrast timing. Collapsed gallbladder. Gallbladder fossa fat edema is attributed to adjacent inflammation Pancreas: Atrophic fatty infiltration Spleen: Negative Adrenals/Urinary Tract: Unremarkable kidneys. Foley catheter in good position. Stomach/Bowel: Fluid dilated stomach with high-density material in the proximal lumen. There is an indistinguishable wall at the duodenal bulb with medially and posteriorly directed ulcer broadly excavating along the pancreaticoduodenal groove. Frothy material in at the level of the ulcer. Lymphatic: Negative for adenopathy mass. Reproductive: Hysterectomy Other: No ascites or pneumoperitoneum Musculoskeletal: Remote L1 compression fracture with cement augmentation. Review of the MIP images confirms the above findings. IMPRESSION: Large and deep ulceration at the duodenal bulb, usually peptic but given the size, ulcerated tumor is also considered. The GDA and gastroepiploic arteries are at the ulcer base and there is suspected blood clot within the dilated stomach, please correlate with NG tube aspiration. No evidence of active bleeding. No pneumoperitoneum. Electronically Signed   By: JJorje GuildM.D.   On: 01/26/2021 05:44     Nutrition Status: NPO       Indwelling Urinary Catheter continued, requirement due to   Reason to continue Indwelling Urinary Catheter strict Intake/Output monitoring for hemodynamic instability   Central Line/ continued, requirement due to  Reason to continue CFranconiaof central venous pressure or other hemodynamic parameters  and poor IV access   Ventilator continued, requirement due to severe respiratory failure   Ventilator Sedation RASS 0 to -2    ASSESSMENT AND PLAN SYNOPSIS  Severe ACUTE Hypoxic and Hypercapnic Respiratory Failure -continue Full MV support -continue Bronchodilator Therapy -Wean Fio2 and PEEP as tolerated -will perform SAT/SBT when respiratory  parameters are met -VAP/VENT bundle implementation   ACUTE KIDNEY INJURY/Renal Failure -Bicarb drip for now -continue Foley Catheter-assess need -Avoid nephrotoxic agents -Follow urine output, BMP -Ensure adequate renal perfusion, optimize oxygenation -Renal dose medications    NEUROLOGY - intubated and sedated - minimal sedation to achieve a RASS goal: -1 Wake up assessment pending  Acute toxic metabolic encephalopathy, need for sedation Goal RASS -2 to -3   SHOCK-Acute Blood Loss Anemia, sepsis? -Transfuse 2PRBC:1FFP for HGB > 7 -use vasopressors to keep MAP60-65 -follow ABG and LA -follow up cultures -emperic ABX -consider stress dose steroids -aggressive IV fluid resuscitation  CARDIAC ICU monitoring  ID -continue IV abx as prescibed -follow up cultures  GI ACUTE GIB/GDA? PPI drip GI consultation Transfuse No more NSAID  ENDO - will use ICU hypoglycemic\Hyperglycemia protocol if needed  ELECTROLYTES -follow labs as needed -replace as needed -pharmacy consultation and following    DVT/GI PRX ordered and assessed TRANSFUSIONS AS NEEDED MONITOR FSBS I Assessed the need for Labs I Assessed the need for Foley I Assessed the need for Central Venous Line Family Discussion when available I Assessed the need for Mobilization I made an Assessment of medications to be adjusted accordingly Safety Risk assessment Completed  CASE DISCUSSED IN MULTIDISCIPLINARY ROUNDS WITH ICU TEAM   Critical Care Time devoted to patient care services described in this note is 50 minutes.   Overall, patient is critically ill, prognosis is guarded.  Patient with Multiorgan failure and at high risk for cardiac arrest and death.

## 2021-01-26 NOTE — Progress Notes (Signed)
Inpatient Diabetes Program Recommendations  AACE/ADA: New Consensus Statement on Inpatient Glycemic Control   Target Ranges:  Prepandial:   less than 140 mg/dL      Peak postprandial:   less than 180 mg/dL (1-2 hours)      Critically ill patients:  140 - 180 mg/dL    Latest Reference Range & Units 01/26/21 07:15  Glucose-Capillary 70 - 99 mg/dL 323 (H)    Latest Reference Range & Units 01/26/21 04:03  Glucose 70 - 99 mg/dL 557 (H)    Latest Reference Range & Units 01/26/21 04:03 01/26/21 04:47  Hemoglobin 12.0 - 15.0 g/dL 3.8 (LL) 3.0 (LL)   Review of Glycemic Control  Diabetes history: No Outpatient Diabetes medications: NA Current orders for Inpatient glycemic control: None  Inpatient Diabetes Program Recommendations:    Insulin: Please consider ordering CBGs Q4H with Novolog 0-9 units Q4H.  NOTE: Per chart, no DM hx noted.  Per ED note by Dr. Dolores Frame, "from home by her husband with a chief complaint of syncope.  Patient was pulled out of the car and found to be not breathing without pulse.  She was brought back immediately to a treatment room.  CPR was initiated.  Patient regained ROSC after brief period of CPR.  Intubated for airway protection." Initial glucose 484 mg/dl on labs 32/20/25 @ 4:27 am. Finger stick glucose 176 mg/dl at 0:62 am today. Hemoglobin 3.8 g/dL initially so B7S would not be accurate if ordered. Would recommend ordering CBGs and Novolog correction.  Thanks, Orlando Penner, RN, MSN, CDE Diabetes Coordinator Inpatient Diabetes Program [phone] (Team Pager from 8am to 5pm)

## 2021-01-26 NOTE — Procedures (Signed)
Central Venous Catheter Insertion Procedure Note  Angela Espinoza  [id number]  October 27, 1955  Date:01/26/21  Time:6:48 AM   Provider Performing:Ellyson Rarick A Wagner Tanzi   Procedure: Insertion of Non-tunneled Central Venous [phone]) with US guidance ([id number])   Indication(s) Medication administration and Difficult access  Consent Risks of the procedure as well as the alternatives and risks of each were explained to the patient and/or caregiver.  Consent for the procedure was obtained and is signed in the bedside chart  Anesthesia Topical only with 1% lidocaine   Timeout Verified patient identification, verified procedure, site/side was marked, verified correct patient position, special equipment/implants available, medications/allergies/relevant history reviewed, required imaging and test results available.  Sterile Technique Maximal sterile technique including full sterile barrier drape, hand hygiene, sterile gown, sterile gloves, mask, hair covering, sterile ultrasound probe cover (if used).  Procedure Description Area of catheter insertion was cleaned with chlorhexidine and draped in sterile fashion.  With real-time ultrasound guidance a central venous catheter was placed into the right femoral vein. Nonpulsatile blood flow and easy flushing noted in all ports.  The catheter was sutured in place and sterile dressing applied. DUE TO EMERGENT NATURE OF THE PROCEDURE, FEMORAL LINE WAS USED  Complications/Tolerance None; patient tolerated the procedure well. Chest X-ray is ordered to verify placement for internal jugular or subclavian cannulation.   Chest x-ray is not ordered for femoral cannulation.  EBL Minimal  Specimen(s) None   Webb Silversmith, DNP, CCRN, FNP-C, AGACNP-BC Acute Care Nurse Practitioner  [facility] Pager: [phone] [place] at [facility]

## 2021-01-26 NOTE — Progress Notes (Signed)
Initial Nutrition Assessment  DOCUMENTATION CODES:   Not applicable  INTERVENTION:   RD will monitor for nutritional plan  If tube feeds initiated, recommend:  Vital HP @ 52ml/hr- Initiate at 75ml/hr and increase by 32ml/hr q 12 hours until goal rate is reached.   Free water flushes 66ml q4 hours to maintain tube patency   Regimen provides 1080kcal/day, 95g/day protein and 1056ml/day of free water   Recommend TPN initiation if unable to initiate enteral nutrition  Pt at high refeed risk; recommend monitor potassium, magnesium and phosphorus labs daily until stable  NUTRITION DIAGNOSIS:   Inadequate oral intake related to inability to eat (pt sedated and ventilated) as evidenced by NPO status.  GOAL:   Provide needs based on ASPEN/SCCM guidelines  MONITOR:   Vent status, Labs, Weight trends, Skin, I & O's  REASON FOR ASSESSMENT:   Ventilator    ASSESSMENT:   65 y/o female with h/o anxiety, depression, HTN, IBS, HLD, DDD and chronic NSAID use who is admitted with syncope and AKI.  Pt s/p CT scan today which reports large and deep ulceration at the duodenal bulb  Pt sedated and ventilated. OGT in place and noted to be in the distal esophagus; MD and RN made aware. Per family report, pt with poor oral intake for the past 6 weeks; pt has been drinking mainly liquids pta. Per chart, pt is down 19lbs(14%) since March. Plan is for EGD today. RD will monitor for nutritional plan once EGD complete. Pt may require TPN if unable to initiate tube feeds.   Medications reviewed and include: colace, insulin, miralax, ceftriaxone, levophed, protonix, Na bicarbonate  Labs reviewed: K 4.2 wnl, BUN 31(H), creat 1.23(H), Mg 2.1 wnl Hgb 7.8(L), Hct 23.5(L) Cbgs- 214, 176 x 24 hrs  Patient is currently intubated on ventilator support MV: 7.2 L/min Temp (24hrs), Avg:95.5 F (35.3 C), Min:93.3 F (34.1 C), Max:97.7 F (36.5 C)  Propofol: none   MAP- >12mmHg   UOP-    NUTRITION - FOCUSED PHYSICAL EXAM:  Flowsheet Row Most Recent Value  Orbital Region No depletion  Upper Arm Region No depletion  Thoracic and Lumbar Region No depletion  Buccal Region No depletion  Temple Region Mild depletion  Clavicle Bone Region No depletion  Clavicle and Acromion Bone Region No depletion  Scapular Bone Region No depletion  Dorsal Hand Mild depletion  Patellar Region Moderate depletion  Anterior Thigh Region Moderate depletion  Posterior Calf Region Moderate depletion  Edema (RD Assessment) None  Hair Reviewed  Eyes Reviewed  Mouth Reviewed  Skin Reviewed  Nails Reviewed   Diet Order:   Diet Order             Diet NPO time specified  Diet effective now                  EDUCATION NEEDS:   No education needs have been identified at this time  Skin:  Skin Assessment: Reviewed RN Assessment  Last BM:  PTA  Height:   Ht Readings from Last 1 Encounters:  01/26/21 5\' 2"  (1.575 m)    Weight:   Wt Readings from Last 1 Encounters:  01/26/21 55 kg    Ideal Body Weight:  50 kg  BMI:  Body mass index is 22.18 kg/m.  Estimated Nutritional Needs:   Kcal:  1122kcal/day  Protein:  85-95g/day  Fluid:  1.3-1.5L/day  01/28/21 MS, RD, LDN Please refer to [facility] for RD and/or RD on-call/weekend/after hours pager

## 2021-01-26 NOTE — Progress Notes (Signed)
Acute Blood Loss Anemia secondary to Acute GI Bleed of the Duodenal Ulcer Hemorrhagic Shock S/p Endoscopy POD # 1 - IVF resuscitation and Pressors to maintain MAP>65 - H&H monitoring q6h -Transfuse PRN Hgb<7 - NGT - NPO for now plan transition to clear liquid as tolerated - If patient has hemodynamically significant bleeding will consider IR Consult for embolization) - Hold NSAIDs, steroids, ASA - Post-EGD Stabilization Complete PPI drip: Protonix 8 mg/hr IV by continuous infusion for 3 days. - GI Consult input appreciated    Webb Silversmith, DNP, CCRN, FNP-C, AGACNP-BC Acute Care Nurse Practitioner  [facility] Pager: [phone] [place] [facility]

## 2021-01-26 NOTE — Progress Notes (Signed)
PHARMACY -  BRIEF ANTIBIOTIC NOTE   Pharmacy has received consult(s) for Cefepime and Vancomycin from an ED provider.  The patient's profile has been reviewed for ht/wt/allergies/indication/available labs.    One time order(s) placed for Cefepime 2 gm and Vancomycin 1 gm per pt wt: 59.4 kg from 12/01/20  Further antibiotics/pharmacy consults should be ordered by admitting physician if indicated.                       Thank you, Otelia Sergeant, PharmD, [facility] 01/26/2021 4:50 AM

## 2021-01-26 NOTE — Progress Notes (Signed)
Removed IO access from right leg without issue.  Carmel Sacramento, RN

## 2021-01-26 NOTE — Progress Notes (Signed)
Pt was transported to CCU from ED while on the vent. 

## 2021-01-27 ENCOUNTER — Inpatient Hospital Stay: Payer: Medicare HMO

## 2021-01-27 LAB — PREPARE FRESH FROZEN PLASMA: Unit division: 0

## 2021-01-27 LAB — CBC WITH DIFFERENTIAL/PLATELET
Abs Immature Granulocytes: 0.06 10*3/uL (ref 0.00–0.07)
Basophils Absolute: 0.1 10*3/uL (ref 0.0–0.1)
Basophils Relative: 1 %
Eosinophils Absolute: 0.1 10*3/uL (ref 0.0–0.5)
Eosinophils Relative: 1 %
HCT: 24.6 % — ABNORMAL LOW (ref 36.0–46.0)
Hemoglobin: 8.7 g/dL — ABNORMAL LOW (ref 12.0–15.0)
Immature Granulocytes: 1 %
Lymphocytes Relative: 19 %
Lymphs Abs: 2.1 10*3/uL (ref 0.7–4.0)
MCH: 28.4 pg (ref 26.0–34.0)
MCHC: 35.4 g/dL (ref 30.0–36.0)
MCV: 80.4 fL (ref 80.0–100.0)
Monocytes Absolute: 0.6 10*3/uL (ref 0.1–1.0)
Monocytes Relative: 5 %
Neutro Abs: 8.4 10*3/uL — ABNORMAL HIGH (ref 1.7–7.7)
Neutrophils Relative %: 73 %
Platelets: 210 10*3/uL (ref 150–400)
RBC: 3.06 MIL/uL — ABNORMAL LOW (ref 3.87–5.11)
RDW: 16.3 % — ABNORMAL HIGH (ref 11.5–15.5)
WBC: 11.3 10*3/uL — ABNORMAL HIGH (ref 4.0–10.5)
nRBC: 0.3 % — ABNORMAL HIGH (ref 0.0–0.2)

## 2021-01-27 LAB — PREPARE RBC (CROSSMATCH)

## 2021-01-27 LAB — BPAM RBC
Blood Product Expiration Date: 202301112359
Blood Product Expiration Date: 202301112359
Blood Product Expiration Date: 202301192359
Blood Product Expiration Date: 202301202359
ISSUE DATE / TIME: 202212290528
ISSUE DATE / TIME: 202212290528
ISSUE DATE / TIME: 202212290816
Unit Type and Rh: 5100
Unit Type and Rh: 5100
Unit Type and Rh: 5100
Unit Type and Rh: 5100

## 2021-01-27 LAB — TYPE AND SCREEN
ABO/RH(D): O POS
Antibody Screen: NEGATIVE
Unit division: 0
Unit division: 0
Unit division: 0
Unit division: 0

## 2021-01-27 LAB — HEMOGLOBIN AND HEMATOCRIT, BLOOD
HCT: 27 % — ABNORMAL LOW (ref 36.0–46.0)
HCT: 24.9 % — ABNORMAL LOW (ref 36.0–46.0)
HCT: 24.6 % — ABNORMAL LOW (ref 36.0–46.0)
Hemoglobin: 9.1 g/dL — ABNORMAL LOW (ref 12.0–15.0)
Hemoglobin: 8.5 g/dL — ABNORMAL LOW (ref 12.0–15.0)
Hemoglobin: 8.3 g/dL — ABNORMAL LOW (ref 12.0–15.0)

## 2021-01-27 LAB — BASIC METABOLIC PANEL
Anion gap: 4 — ABNORMAL LOW (ref 5–15)
BUN: 24 mg/dL — ABNORMAL HIGH (ref 8–23)
CO2: 29 mmol/L (ref 22–32)
Calcium: 7.5 mg/dL — ABNORMAL LOW (ref 8.9–10.3)
Chloride: 104 mmol/L (ref 98–111)
Creatinine, Ser: 0.92 mg/dL (ref 0.44–1.00)
GFR, Estimated: >60 mL/min (ref >60)
Glucose, Bld: 103 mg/dL — ABNORMAL HIGH (ref 70–99)
Potassium: 2.8 mmol/L — ABNORMAL LOW (ref 3.5–5.1)
Sodium: 137 mmol/L (ref 135–145)

## 2021-01-27 LAB — BPAM FFP
Blood Product Expiration Date: 202301032359
Blood Product Expiration Date: 202301032359
ISSUE DATE / TIME: 202212290953
ISSUE DATE / TIME: 202212291050
Unit Type and Rh: 5100
Unit Type and Rh: 6200

## 2021-01-27 LAB — GLUCOSE, CAPILLARY
Glucose-Capillary: 102 mg/dL — ABNORMAL HIGH (ref 70–99)
Glucose-Capillary: 91 mg/dL (ref 70–99)
Glucose-Capillary: 80 mg/dL (ref 70–99)
Glucose-Capillary: 81 mg/dL (ref 70–99)
Glucose-Capillary: 73 mg/dL (ref 70–99)

## 2021-01-27 LAB — URINE CULTURE: Culture: NO GROWTH

## 2021-01-27 LAB — PHOSPHORUS: Phosphorus: 2.8 mg/dL (ref 2.5–4.6)

## 2021-01-27 LAB — POTASSIUM: Potassium: 3.8 mmol/L (ref 3.5–5.1)

## 2021-01-27 LAB — TRIGLYCERIDES: Triglycerides: 80 mg/dL (ref <150)

## 2021-01-27 LAB — MAGNESIUM: Magnesium: 2 mg/dL (ref 1.7–2.4)

## 2021-01-27 MED ORDER — POTASSIUM CHLORIDE 10 MEQ/100ML IV SOLN
10.0000 meq | INTRAVENOUS | Status: AC
  Administered 2021-01-27 (×6): 10 meq via INTRAVENOUS
  Filled 2021-01-27 (×6): qty 100

## 2021-01-27 MED ORDER — FENTANYL CITRATE PF 50 MCG/ML IJ SOSY
50.0000 ug | PREFILLED_SYRINGE | INTRAMUSCULAR | Status: DC | PRN
Start: 2021-01-27 — End: 2021-01-30
  Administered 2021-01-28: 100 ug via INTRAVENOUS
  Filled 2021-01-27: qty 2

## 2021-01-27 MED ORDER — FENTANYL CITRATE PF 50 MCG/ML IJ SOSY: 25.0000 ug | PREFILLED_SYRINGE | INTRAMUSCULAR | Status: DC | PRN

## 2021-01-27 MED ORDER — POLYETHYLENE GLYCOL 3350 17 G PO PACK
17.0000 g | PACK | Freq: Every day | ORAL | Status: DC
  Administered 2021-01-28: 17 g
  Filled 2021-01-27 (×2): qty 1

## 2021-01-27 MED ORDER — PROPOFOL 1000 MG/100ML IV EMUL
0.0000 ug/kg/min | INTRAVENOUS | Status: DC
  Administered 2021-01-27: 23:00:00 30 ug/kg/min via INTRAVENOUS
  Administered 2021-01-27 – 2021-01-28 (×2): 25 ug/kg/min via INTRAVENOUS
  Administered 2021-01-28: 30 ug/kg/min via INTRAVENOUS
  Administered 2021-01-28: 45 ug/kg/min via INTRAVENOUS
  Administered 2021-01-29: 30 ug/kg/min via INTRAVENOUS
  Filled 2021-01-27 (×5): qty 100

## 2021-01-27 MED ORDER — DOCUSATE SODIUM 50 MG/5ML PO LIQD
100.0000 mg | Freq: Two times a day (BID) | ORAL | Status: DC
  Administered 2021-01-27 – 2021-01-29 (×4): 100 mg
  Filled 2021-01-27 (×5): qty 10

## 2021-01-27 MED ORDER — FENTANYL CITRATE PF 50 MCG/ML IJ SOSY
25.0000 ug | PREFILLED_SYRINGE | INTRAMUSCULAR | Status: DC | PRN
  Administered 2021-01-28 – 2021-01-29 (×2): 25 ug via INTRAVENOUS
  Filled 2021-01-27 (×2): qty 1

## 2021-01-27 NOTE — TOC Initial Note (Signed)
Transition of Care [facility]) - Initial/Assessment Note    Patient Details  Name: Angela Espinoza MRN: [id number] Date of Birth: 1955-03-02  Transition of Care [facility]) CM/SW Contact:    Allayne Butcher, RN Phone Number: 01/27/2021, 10:58 AM  Clinical Narrative:                 Patient admitted to the hospital with GI Bleed, hgb was as low at 3 yesterday, patient cardiac arrested and was resuscitated in the emergency department.  Husband brought her to the emergency room from home.  Patient is currently intubated and sedated in the ICU.  TOC will follow for needs.     Expected Discharge Plan:  (TBD) Barriers to Discharge: Continued Medical Work up   Patient Goals and CMS Choice Patient states their goals for this hospitalization and ongoing recovery are:: patient sedated and intubated      Expected Discharge Plan and Services Expected Discharge Plan:  (TBD)       Living arrangements for the past 2 months: Single Family Home                 DME Arranged: N/A DME Agency: NA       HH Arranged: NA          Prior Living Arrangements/Services Living arrangements for the past 2 months: Single Family Home Lives with:: Spouse Patient language and need for interpreter reviewed:: Yes        Need for Family Participation in Patient Care: Yes (Comment) Care giver support system in place?: Yes (comment) (husband)   Criminal Activity/Legal Involvement Pertinent to Current Situation/Hospitalization: No - Comment as needed  Activities of Daily Living      Permission Sought/Granted                  Emotional Assessment Appearance:: Appears stated age Attitude/Demeanor/Rapport: Sedated Affect (typically observed): Unable to Assess   Alcohol / Substance Use: Not Applicable Psych Involvement: No (comment)  Admission diagnosis:  Syncope and collapse [R55] Peptic ulcer disease [K27.9] Acute GI bleeding [K92.2] Upper GI bleed [K92.2] AKI (acute kidney injury) (HCC)  [N17.9] Hypothermia, initial encounter [T68.XXXA] Hypotension, unspecified hypotension type [I95.9] Cardiopulmonary arrest with successful resuscitation (HCC) [I46.9] Anemia, unspecified type [D64.9] Sepsis, due to unspecified organism, unspecified whether acute organ dysfunction present [facility]) [A41.9] Patient Active Problem List   Diagnosis Date Noted   Syncope and collapse 01/26/2021   Acute GI bleeding 01/26/2021   Other intervertebral disc degeneration, thoracic region 12/27/2020   Other intervertebral disc degeneration, lumbar region 12/27/2020   Abnormal mammogram of left breast 09/24/2019   Acid reflux 12/03/2018   Neurogenic pain 11/03/2018   Thoracic facet syndrome (Left) 09/30/2018   Spondylosis without myelopathy or radiculopathy, lumbosacral region 09/30/2018   Spondylosis without myelopathy or radiculopathy, thoracic region 09/30/2018   DDD (degenerative disc disease), thoracolumbar 09/30/2018   DDD (degenerative disc disease), lumbosacral 07/24/2018   History of L1 kyphoplasty 01/01/2018   Traumatic compression fracture of L1 lumbar vertebra, sequela (2016) 01/01/2018   Spondylosis without myelopathy or radiculopathy, lumbar region 01/01/2018   Lumbar facet syndrome (Left) 01/01/2018   Allergy 12/10/2017   Anxiety 12/10/2017   Asthma without status asthmaticus 12/10/2017   Depression 12/10/2017   Hypertension 12/10/2017   Chronic low back pain (1ry area of Pain) (Left) w/o sciatica 12/10/2017   Chronic pain syndrome 12/10/2017   Pharmacologic therapy 12/10/2017   Disorder of skeletal system 12/10/2017   Problems influencing health status 12/10/2017   Postmenopause  06/02/2014   PCP:  Delman Cheadle, PA Pharmacy:   [facility] [phone] [place], [place] - [address] [place] [address] Phone: [phone] Fax: [phone]     Social Determinants of Health (SDOH) Interventions    Readmission Risk Interventions No  flowsheet data found.

## 2021-01-27 NOTE — Progress Notes (Signed)
Nutrition Follow Up Note  ° °DOCUMENTATION CODES:  ° °Not applicable ° °INTERVENTION:  ° °If pt does not extubate, recommend: ° °Vital HP @ 45ml/hr- Initiate at 25ml/hr and increase by 10ml/hr q 12 hours until goal rate is reached.  ° °Free water flushes 30ml q4 hours to maintain tube patency  ° °Regimen provides 1080kcal/day, 95g/day protein and 1083ml/day of free water  ° °Pt at high refeed risk; recommend monitor potassium, magnesium and phosphorus labs daily until stable ° °NUTRITION DIAGNOSIS:  ° °Inadequate oral intake related to inability to eat (pt sedated and ventilated) as evidenced by NPO status. ° °GOAL:  ° °Provide needs based on ASPEN/SCCM guidelines °-not met  ° °MONITOR:  ° °Vent status, Labs, Weight trends, Skin, I & O's ° °ASSESSMENT:  ° °65 y/o female with h/o anxiety, depression, HTN, IBS, HLD, DDD and chronic NSAID use who is admitted with syncope and AKI. ° °Pt s/p EGD 12/29; pt noted to have large duodenal ulcer ° °Pt remains sedated and ventilated. OGT in place and noted to be in the stomach. Spoke with GI, will plan to initiate tube feeds if pt does not extubate today. Pressors and sedation currently on hold. Pt up ~5lbs since admit; +2.8L on her I & Os.  ° °Medications reviewed and include: colace, insulin, miralax, levophed, protonix, KCl  ° °Labs reviewed: K 2.8(L), BUN 24(H), P 2.8 wnl, Mg 2.1 wnl °Hgb 9.1(L), Hct 27.0(L) °Cbgs- 91, 102 x 24 hrs ° °Patient is currently intubated on ventilator support °MV: 3.8 L/min °Temp (24hrs), Avg:98.9 °F (37.2 °C), Min:97.7 °F (36.5 °C), Max:100.2 °F (37.9 °C) ° °Propofol: none  ° °MAP- >65mmHg  ° °UOP- 780ml  ° °Diet Order:   °Diet Order   ° °       °  Diet NPO time specified  Diet effective now       °  ° °  °  ° °  ° °EDUCATION NEEDS:  ° °No education needs have been identified at this time ° °Skin:  Skin Assessment: Reviewed RN Assessment ° °Last BM:  PTA ° °Height:  ° °Ht Readings from Last 1 Encounters:  °01/26/21 5' 2" (1.575 m)  ° ° °Weight:   ° °Wt Readings from Last 1 Encounters:  °01/27/21 58.2 kg  ° ° °Ideal Body Weight:  50 kg ° °BMI:  Body mass index is 23.47 kg/m². ° °Estimated Nutritional Needs:  ° °Kcal:  1122kcal/day ° °Protein:  85-95g/day ° °Fluid:  1.3-1.5L/day ° °Casey Campbell MS, RD, LDN °Please refer to AMION for RD and/or RD on-call/weekend/after hours pager ° °

## 2021-01-27 NOTE — Consult Note (Signed)
PHARMACY CONSULT NOTE - FOLLOW UP  Pharmacy Consult for Electrolyte Monitoring and Replacement   Recent Labs: Potassium (mmol/L)  Date Value  01/27/2021 2.8 (L)  08/12/2011 4.4   Magnesium (mg/dL)  Date Value  96/78/9381 2.0  08/11/2011 2.2   Calcium (mg/dL)  Date Value  01/75/1025 7.5 (L)   Calcium, Total (mg/dL)  Date Value  85/27/7824 8.5   Albumin (g/dL)  Date Value  23/53/6144 2.2 (L)  01/01/2018 4.6  08/10/2011 4.2   Phosphorus (mg/dL)  Date Value  31/54/0086 2.8  08/10/2011 1.6 (L)   Sodium (mmol/L)  Date Value  01/27/2021 137  01/01/2018 136  08/12/2011 138     Assessment: 65yo female with PMH of Anxiety, Asthma, Depression, Hyperlipidemia, Hypertension, and IBS who was admitted to the hospital for syncope. Patient found to have hemoglobin 3.8 on admission, s/p transfusion and fluid resuscitation. EGD 12/29 with treatment of duodenal ulcer. Patient remains intubated in the ICU at this time. Pharmacy was consulted for electrolyte monitoring and replacement.     Goal of Therapy:  Electrolytes WNL  Plan:  --Potassium 10 mEq IV x 6 --Continue to monitor and replace electrolytes as clinically indicated   Pricilla Riffle, PharmD, BCPS Clinical Pharmacist 01/27/2021 12:06 PM

## 2021-01-27 NOTE — Progress Notes (Signed)
GI Inpatient Follow-up Note  Subjective:  Patient seen and is still intubated. Sedation has been weaned off. No overt GI bleeding overnight. Hemoglobin is stable  Scheduled Inpatient Medications:   chlorhexidine gluconate (MEDLINE KIT)  15 mL Mouth Rinse BID   Chlorhexidine Gluconate Cloth  6 each Topical Q0600   docusate  100 mg Per Tube BID   insulin aspart  0-9 Units Subcutaneous Q4H   mouth rinse  15 mL Mouth Rinse 10 times per day   mupirocin ointment  1 application Nasal BID   polyethylene glycol  17 g Per Tube Daily    Continuous Inpatient Infusions:    sodium chloride Stopped (01/26/21 0741)   fentaNYL infusion INTRAVENOUS 150 mcg/hr (01/27/21 0400)   midazolam 5 mg/hr (01/27/21 0400)   norepinephrine (LEVOPHED) Adult infusion 4 mcg/min (01/27/21 0200)   pantoprazole 8 mg/hr (01/27/21 1137)   propofol (DIPRIVAN) infusion     sodium chloride Stopped (01/26/21 0734)   And   sodium chloride      PRN Inpatient Medications:  docusate sodium, fentaNYL, midazolam, polyethylene glycol  Review of Systems: Unable to do due to being intubated   Physical Examination: BP (!) 125/91 (BP Location: Right Arm)    Pulse 76    Temp 98.6 F (37 C) (Bladder)    Resp (!) 2    Ht _0  (1.575 m)    Wt 58.2 kg    SpO2 100%    BMI 23.47 kg/m  Gen: Sedated HEENT: PEERLA Neck: supple, no JVD or thyromegaly Chest: Intubated CV: tachycardic Abd: soft, non-tender, non-distended Ext: no edema Skin: no rash or lesions noted Lymph: no lymphadenopathy  Data: Lab Results  Component Value Date   WBC 11.3 (H) 01/27/2021   HGB 9.1 (L) 01/27/2021   HCT 27.0 (L) 01/27/2021   MCV 80.4 01/27/2021   PLT 210 01/27/2021   Recent Labs  Lab 01/26/21 1705 01/27/21 0349 01/27/21 1029  HGB 8.8* 8.7* 9.1*   Lab Results  Component Value Date   NA 137 01/27/2021   K 2.8 (L) 01/27/2021   CL 104 01/27/2021   CO2 29 01/27/2021   BUN 24 (H) 01/27/2021   CREATININE 0.92 01/27/2021   Lab  Results  Component Value Date   ALT 13 01/26/2021   AST 23 01/26/2021   ALKPHOS 46 01/26/2021   BILITOT 0.5 01/26/2021   Recent Labs  Lab 01/26/21 0447  INR 1.8*   Assessment/Plan: Ms. Wanner is a 65 y.o. lady with Forrest Classification II peptic ulcer s/p epinephrine and bipolar cautery of posterior bulb visible vessel. No overt GI bleeding and hemoglobin stable  Recommendations:  - continue protonix gtt for two more days and then can switch to IV PPI BID. Will need PO protonix BID at discharge - if patient develops recurrent hemodynamically significant GI bleeding will need vascular consult for possible empiric GDA embolization - no more NSAIDS - ok to advance tube feeds - given size of ulcer, will likely perform repeat EGD as an outpatient to assess healing and get biopsies - recommend checking h pylori serology or stool antigen and treat if positive  If any questions over the weekend, contact on-call GI doctor  Raylene Miyamoto MD, MPH [place]

## 2021-01-27 NOTE — Progress Notes (Signed)
NAME:  Angela Espinoza, MRN:  [id number], DOB:  [birth year], LOS: 1 ADMISSION DATE:  01/26/2021 BRIEF SYNOPSIS 65 yo WF presented via private vehicle to the ED after having several syncopal episodes in as many days. Last night she developed diaphoresis and increased tone/stiffness, which prompted her husband to bring her to the ED. She again had syncope on arrival to the ED, and indeed was thought to be in PEA while still in the car. CPR was initiated, but within 15 seconds the patient was moving. She did require intubation. She was notably pale and indeed her presenting hemoglobin was 3.8 with a follow-up of 3.0 about 38mn later. There was not overt melena or hematochezia to account for this. A CT abd was performed revealed a large and deep ulceration at the duodenal bulb, usually peptic, ulcerated tumor is also considered.The GDA and gastroepiploic arteries are at the ulcer base and there is suspected blood clot within the dilated stomach. Her history is notable for Mobic. Two units of PRBC were transfused prior to her arrival in the ICU. Two more are pending. Levophed was started.    ER Course: CPR x15s50 ETT/MV Levophed 10 4 units of blood ordered, 2 transfused (finishing on arrival to ICU)   Significant Hospital Events: Including procedures, antibiotic start and stop dates in addition to other pertinent events   12/20 remains intubated,sedated      Antimicrobials:   Antibiotics Given (last 72 hours)     Date/Time Action Medication Dose Rate   01/26/21 0800 New Bag/Given  [pt just came from ED]   ceFEPIme (MAXIPIME) 2 g in sodium chloride 0.9 % 100 mL IVPB 2 g 200 mL/hr   01/26/21 0837 New Bag/Given   vancomycin (VANCOCIN) IVPB 1000 mg/200 mL premix 1,000 mg 200 mL/hr   01/26/21 05364New Bag/Given  [dose not given in ED]   metroNIDAZOLE (FLAGYL) IVPB 500 mg 500 mg 100 mL/hr   01/26/21 1213 New Bag/Given   cefTRIAXone (ROCEPHIN) 2 g in sodium chloride 0.9 % 100 mL IVPB 2  g 200 mL/hr            Interim History / Subjective:  Remains intubated Remains critically ill On pressors and sedation Will attempt SAT/SBT     Objective   Blood pressure (!) 113/56, pulse 70, temperature 99.1 F (37.3 C), resp. rate 18, height _0  (1.575 m), weight 58.2 kg, SpO2 100 %.    Vent Mode: PRVC FiO2 (%):  [28 %] 28 % Set Rate:  [18 bmp] 18 bmp Vt Set:  [400 mL] 400 mL PEEP:  [5 cmH20] 5 cmH20   Intake/Output Summary (Last 24 hours) at 01/27/2021 0745 Last data filed at 01/27/2021 0500 Gross per 24 hour  Intake 4179.59 ml  Output 1280 ml  Net 2899.59 ml   Filed Weights   01/26/21 0600 01/26/21 0800 01/27/21 0320  Weight: 56 kg 55 kg 58.2 kg      REVIEW OF SYSTEMS  PATIENT IS UNABLE TO PROVIDE COMPLETE REVIEW OF SYSTEMS DUE TO SEVERE CRITICAL ILLNESS AND TOXIC METABOLIC ENCEPHALOPATHY  ALL OTHER ROS ARE NEGATIVE   PHYSICAL EXAMINATION:  GENERAL:critically ill appearing, +resp distress EYES: Pupils equal, round, reactive to light.  No scleral icterus.  MOUTH: Moist mucosal membrane. INTUBATED NECK: Supple.  PULMONARY: +rhonchi, +wheezing CARDIOVASCULAR: S1 and S2.  No murmurs  GASTROINTESTINAL: Soft, nontender, -distended. Positive bowel sounds.  MUSCULOSKELETAL: No swelling, clubbing, or edema.  NEUROLOGIC: obtunded SKIN:intact,warm,dry    Labs/imaging that I havepersonally reviewed  (  right click and "Reselect all SmartList Selections" daily)       ASSESSMENT AND PLAN SYNOPSIS  Admitted for Severe ACUTE Hypoxic and Hypercapnic Respiratory Failure due to acute blood loss anemia with severe demand ischemia causing acute cardiac arrest with multiorgan failure  -continue Mechanical Ventilator support -continue Bronchodilator Therapy -Wean Fio2 and PEEP as tolerated -VAP/VENT bundle implementation -will perform SAT/SBT when respiratory parameters are met  Vent Mode: PRVC FiO2 (%):  [28 %] 28 % Set Rate:  [18 bmp] 18 bmp Vt  Set:  [400 mL] 400 mL PEEP:  [5 cmH20] 5 cmH20   CARDIAC FAILURE-severe demand ischemia -oxygen as needed -Lasix as tolerated -follow up cardiac enzymes as indicated   CARDIAC ICU monitoring   ACUTE KIDNEY INJURY/Renal Failure -continue Foley Catheter-assess need -Avoid nephrotoxic agents -Follow urine output, BMP -Ensure adequate renal perfusion, optimize oxygenation -Renal dose medications   Intake/Output Summary (Last 24 hours) at 01/27/2021 0745 Last data filed at 01/27/2021 0500 Gross per 24 hour  Intake 4179.59 ml  Output 1280 ml  Net 2899.59 ml     NEUROLOGY Acute toxic metabolic encephalopathy from hypoxia  Wean sedation and assess neuro status   HYPOVOLUMIC SHOCK SOURCE-GIB -use vasopressors to keep MAP>65 as needed -follow ABG and LA   ENDO - ICU hypoglycemic\Hyperglycemia protocol -check FSBS per protocol   GI GI PROPHYLAXIS as indicated  NUTRITIONAL STATUS DIET-->NPO Constipation protocol as indicated   ELECTROLYTES -follow labs as needed -replace as needed -pharmacy consultation and following     Best practice (right click and "Reselect all SmartList Selections" daily)  Diet:  NPO Pain/Anxiety/Delirium protocol (if indicated): Yes (RASS goal -1) VAP protocol (if indicated): Yes DVT prophylaxis: Contraindicated GI prophylaxis: PPI Glucose control:  SSI No Central venous access:  Yes, and it is still needed Arterial line:  N/A Foley:  Yes, and it is still needed Mobility:  bed rest  Code Status:  FULL CODE Disposition: ICU  Labs   CBC: Recent Labs  Lab 01/26/21 0358 01/26/21 0403 01/26/21 0447 01/26/21 1230 01/26/21 1705 01/27/21 0349  WBC 17.0* 18.6*  --   --   --  11.3*  NEUTROABS  --  10.4*  --   --   --  8.4*  HGB 8.0* 3.8* 3.0* 7.8* 8.8* 8.7*  HCT 22.7* 13.7* 11.3* 23.5* 24.9* 24.6*  MCV 82.5 102.2*  --   --   --  80.4  PLT 247 491*  --   --   --  591    Basic Metabolic Panel: Recent Labs  Lab  01/26/21 0403 01/26/21 0720 01/27/21 0349  NA 138  --  137  K 4.2  --  2.8*  CL 106  --  104  CO2 10*  --  29  GLUCOSE 484*  --  103*  BUN 31*  --  24*  CREATININE 1.23*  --  0.92  CALCIUM 8.8*  --  7.5*  MG  --  2.1 2.0  PHOS  --  4.2 2.8   GFR: Estimated Creatinine Clearance: 47.6 mL/min (by C-G formula based on SCr of 0.92 mg/dL). Recent Labs  Lab 01/26/21 0358 01/26/21 0403 01/26/21 0720 01/26/21 1407 01/26/21 2147 01/27/21 0349  PROCALCITON  --   --  <0.10  --   --   --   WBC 17.0* 18.6*  --   --   --  11.3*  LATICACIDVEN  --  >9.0* 4.9* 1.4 0.9  --     Liver Function Tests: Recent Labs  Lab 01/26/21 0403  AST 23  ALT 13  ALKPHOS 46  BILITOT 0.5  PROT 4.6*  ALBUMIN 2.2*   Recent Labs  Lab 01/26/21 0403  LIPASE 27   No results for input(s): AMMONIA in the last 168 hours.  ABG    Component Value Date/Time   PHART 7.51 (H) 01/26/2021 1317   PCO2ART 40 01/26/2021 1317   PO2ART 141 (H) 01/26/2021 1317   HCO3 31.9 (H) 01/26/2021 1317   ACIDBASEDEF 24.5 (H) 01/26/2021 0415   O2SAT 99.4 01/26/2021 1317     Coagulation Profile: Recent Labs  Lab 01/26/21 0447  INR 1.8*    Cardiac Enzymes: No results for input(s): CKTOTAL, CKMB, CKMBINDEX, TROPONINI in the last 168 hours.  HbA1C: No results found for: HGBA1C  CBG: Recent Labs  Lab 01/26/21 1718 01/26/21 1914 01/26/21 2310 01/27/21 0314 01/27/21 0736  GLUCAP 282* 80 89 102* 91    Allergies Allergies  Allergen Reactions   Indapamide Other (See Comments)    tingling all over body       DVT/GI PRX  assessed I Assessed the need for Labs I Assessed the need for Foley I Assessed the need for Central Venous Line Family Discussion when available I Assessed the need for Mobilization I made an Assessment of medications to be adjusted accordingly Safety Risk assessment completed  CASE DISCUSSED IN MULTIDISCIPLINARY ROUNDS WITH ICU TEAM     Critical Care Time devoted to patient  care services described in this note is 50 minutes.  Critical care was necessary to treat or prevent imminent or life-threatening deterioration.    Patient with Multiorgan failure and at high risk for cardiac arrest and death.    Corrin Parker, M.D.  [facility]  Medical Director [place] Director [facility] Cardio-Pulmonary Department

## 2021-01-28 DIAGNOSIS — G928 Other toxic encephalopathy: Secondary | ICD-10-CM

## 2021-01-28 LAB — CBC
HCT: 25.2 % — ABNORMAL LOW (ref 36.0–46.0)
Hemoglobin: 8.5 g/dL — ABNORMAL LOW (ref 12.0–15.0)
MCH: 28.8 pg (ref 26.0–34.0)
MCHC: 33.7 g/dL (ref 30.0–36.0)
MCV: 85.4 fL (ref 80.0–100.0)
Platelets: 167 10*3/uL (ref 150–400)
RBC: 2.95 MIL/uL — ABNORMAL LOW (ref 3.87–5.11)
RDW: 16.5 % — ABNORMAL HIGH (ref 11.5–15.5)
WBC: 9.9 10*3/uL (ref 4.0–10.5)
nRBC: 0 % (ref 0.0–0.2)

## 2021-01-28 LAB — GLUCOSE, CAPILLARY
Glucose-Capillary: 74 mg/dL (ref 70–99)
Glucose-Capillary: 80 mg/dL (ref 70–99)
Glucose-Capillary: 77 mg/dL (ref 70–99)
Glucose-Capillary: 90 mg/dL (ref 70–99)
Glucose-Capillary: 80 mg/dL (ref 70–99)
Glucose-Capillary: 101 mg/dL — ABNORMAL HIGH (ref 70–99)
Glucose-Capillary: 90 mg/dL (ref 70–99)

## 2021-01-28 LAB — TYPE AND SCREEN
ABO/RH(D): O POS
Antibody Screen: NEGATIVE
Unit division: 0
Unit division: 0
Unit division: 0

## 2021-01-28 LAB — BASIC METABOLIC PANEL
Anion gap: 3 — ABNORMAL LOW (ref 5–15)
BUN: 19 mg/dL (ref 8–23)
CO2: 27 mmol/L (ref 22–32)
Calcium: 8.1 mg/dL — ABNORMAL LOW (ref 8.9–10.3)
Chloride: 105 mmol/L (ref 98–111)
Creatinine, Ser: 0.67 mg/dL (ref 0.44–1.00)
GFR, Estimated: >60 mL/min (ref >60)
Glucose, Bld: 98 mg/dL (ref 70–99)
Potassium: 3.7 mmol/L (ref 3.5–5.1)
Sodium: 135 mmol/L (ref 135–145)

## 2021-01-28 LAB — BPAM RBC
Blood Product Expiration Date: 202301012359
Blood Product Expiration Date: 202301192359
Blood Product Expiration Date: 202301202359
ISSUE DATE / TIME: 202212290816
ISSUE DATE / TIME: 202212291218
ISSUE DATE / TIME: 202212301056
Unit Type and Rh: 5100
Unit Type and Rh: 5100
Unit Type and Rh: 5100

## 2021-01-28 LAB — HEMOGLOBIN AND HEMATOCRIT, BLOOD
HCT: 26.1 % — ABNORMAL LOW (ref 36.0–46.0)
HCT: 25.3 % — ABNORMAL LOW (ref 36.0–46.0)
HCT: 24.8 % — ABNORMAL LOW (ref 36.0–46.0)
Hemoglobin: 8.8 g/dL — ABNORMAL LOW (ref 12.0–15.0)
Hemoglobin: 8.4 g/dL — ABNORMAL LOW (ref 12.0–15.0)
Hemoglobin: 8.3 g/dL — ABNORMAL LOW (ref 12.0–15.0)

## 2021-01-28 LAB — VITAMIN B12: Vitamin B-12: 650 pg/mL (ref 180–914)

## 2021-01-28 LAB — PHOSPHORUS: Phosphorus: 3.4 mg/dL (ref 2.5–4.6)

## 2021-01-28 LAB — MAGNESIUM: Magnesium: 2 mg/dL (ref 1.7–2.4)

## 2021-01-28 MED ORDER — FREE WATER
30.0000 mL | Status: DC
  Administered 2021-01-28 – 2021-01-29 (×3): 30 mL

## 2021-01-28 MED ORDER — DEXMEDETOMIDINE HCL IN NACL 400 MCG/100ML IV SOLN
0.4000 ug/kg/h | INTRAVENOUS | Status: DC
  Administered 2021-01-28: 0.4 ug/kg/h via INTRAVENOUS
  Administered 2021-01-28 – 2021-01-29 (×2): 1 ug/kg/h via INTRAVENOUS
  Filled 2021-01-28 (×3): qty 100

## 2021-01-28 MED ORDER — MIDAZOLAM HCL 2 MG/2ML IJ SOLN
1.0000 mg | INTRAMUSCULAR | Status: DC | PRN
Start: 2021-01-28 — End: 2021-01-31
  Administered 2021-01-29 (×2): 2 mg via INTRAVENOUS
  Filled 2021-01-28: qty 2

## 2021-01-28 MED ORDER — VITAL HIGH PROTEIN PO LIQD
1000.0000 mL | ORAL | Status: DC
  Administered 2021-01-28: 1000 mL

## 2021-01-28 NOTE — Consult Note (Signed)
PHARMACY CONSULT NOTE - FOLLOW UP  Pharmacy Consult for Electrolyte Monitoring and Replacement   Recent Labs: Potassium (mmol/L)  Date Value  01/28/2021 3.7  08/12/2011 4.4   Magnesium (mg/dL)  Date Value  16/10/9602 2.0  08/11/2011 2.2   Calcium (mg/dL)  Date Value  54/09/8117 8.1 (L)   Calcium, Total (mg/dL)  Date Value  14/78/2956 8.5   Albumin (g/dL)  Date Value  21/30/8657 2.2 (L)  01/01/2018 4.6  08/10/2011 4.2   Phosphorus (mg/dL)  Date Value  84/69/6295 3.4  08/10/2011 1.6 (L)   Sodium (mmol/L)  Date Value  01/28/2021 135  01/01/2018 136  08/12/2011 138     Assessment: 65yo female with PMH of Anxiety, Asthma, Depression, Hyperlipidemia, Hypertension, and IBS who was admitted to the hospital for syncope. Patient found to have hemoglobin 3.8 on admission, s/p transfusion and fluid resuscitation. EGD 12/29 with treatment of duodenal ulcer. Patient remains intubated in the ICU at this time. Pharmacy was consulted for electrolyte monitoring and replacement.     Goal of Therapy:  Electrolytes WNL  Plan:  --No replacement currently needed --Continue to monitor and replace electrolytes as clinically indicated   Bettey Costa, PharmD Clinical Pharmacist 01/28/2021 7:51 AM

## 2021-01-28 NOTE — Consult Note (Signed)
Neurology Consultation  Reason for Consult: AMS post cardiac arrest, difficult to wean Referring Physician: Dr Weston Anna  CC: Altered mental status postcardiac arrest, difficult to wean, concern for anoxic brain injury  History is obtained from: Chart, patient's husband at bedside  HPI: Angela Espinoza is a 66 y.o. female past medical history of hypertension hyperlipidemia irritable bowel syndrome asthma depression anxiety who is admitted to the ICU since 01/26/2021 after having multiple syncopal episodes in the preceding days and then had an episode of severe sweating, stiffness and seizure-like activity that brought her to the hospital via her husband.  She again had a syncopal/loss of consciousness episode in the ED and was thought to be in PEA while still in the car.  CPR was initiated but within 15 seconds the patient was moving.  She did require intubation due to inability protect her airway.  She was notably pale and her presenting hemoglobin was 3.8 followed by 3.0. Husband reports that she had an episode of dark bowel movement preceding this at home when she became unresponsive.  CT abdomen showed large and deep ulceration of the duodenal bulb.  Possible blood clot within the dilated stomach was noted. Patient is admitted to the ICU.  Blood and volume resuscitation performed.  GI following and performed EGD with cauterization of the ulcer. ICU team is attempting sedation holiday and wake-up assessments and the patient remains unable to follow commands, gets very agitated and needs to be put back on sedation for vent dyssynchrony and safety. MRI of the brain was performed-no evidence of hypoxic/anoxic damage.  No evidence of stroke. Most recent hemoglobin 8.8.  ROS: Unable to obtain due to altered mental status.   Past Medical History:  Diagnosis Date   Anxiety    Asthma    NO INHALER-SMOKE AND PERFUME TRIGGERS ASTHMA   Depression    Hyperlipidemia    Hypertension    IBS  (irritable bowel syndrome)    Family History  Problem Relation Age of Onset   Heart attack Mother    Lung cancer Father    Breast cancer Neg Hx    Social History:   reports that she has never smoked. She has been exposed to tobacco smoke. She has never used smokeless tobacco. She reports current alcohol use. She reports that she does not use drugs.  Medications  Current Facility-Administered Medications:    0.9 %  sodium chloride infusion, 250 mL, Intravenous, Continuous, Ouma, Bing Neighbors, NP, Stopped at 01/26/21 0741   chlorhexidine gluconate (MEDLINE KIT) (PERIDEX) 0.12 % solution 15 mL, 15 mL, Mouth Rinse, BID, Nelle Don, MD, 15 mL at 01/28/21 8115   Chlorhexidine Gluconate Cloth 2 % PADS 6 each, 6 each, Topical, Q0600, Nelle Don, MD, 6 each at 01/26/21 2230   docusate (COLACE) 50 MG/5ML liquid 100 mg, 100 mg, Per Tube, BID, Flora Lipps, MD, 100 mg at 01/28/21 0901   docusate sodium (COLACE) capsule 100 mg, 100 mg, Oral, BID PRN, Lang Snow, NP   fentaNYL (SUBLIMAZE) injection 25 mcg, 25 mcg, Intravenous, Q15 min PRN, Mortimer Fries, Kurian, MD   fentaNYL (SUBLIMAZE) injection 50-100 mcg, 50-100 mcg, Intravenous, Q2H PRN, Rust-Chester, Britton L, NP, 100 mcg at 01/28/21 0821   insulin aspart (novoLOG) injection 0-9 Units, 0-9 Units, Subcutaneous, Q4H, Nelle Don, MD, 3 Units at 01/26/21 1208   MEDLINE mouth rinse, 15 mL, Mouth Rinse, 10 times per day, Nelle Don, MD, 15 mL at 01/28/21 1132   midazolam (VERSED) injection 2 mg, 2  mg, Intravenous, Q2H PRN, Lang Snow, NP, 2 mg at 01/27/21 2200   mupirocin ointment (BACTROBAN) 2 % 1 application, 1 application, Nasal, BID, Nelle Don, MD, 1 application at 32/99/24 0902   norepinephrine (LEVOPHED) 28m in 2560m(0.016 mg/mL) premix infusion, 2-10 mcg/min, Intravenous, Titrated, Ouma, ElBing NeighborsNP, Stopped at 01/27/21 0800   pantoprozole (PROTONIX) 80 mg /NS 100 mL infusion, 8 mg/hr,  Intravenous, Continuous, SuPaulette BlanchMD, Last Rate: 10 mL/hr at 01/28/21 1200, 8 mg/hr at 01/28/21 1200   polyethylene glycol (MIRALAX / GLYCOLAX) packet 17 g, 17 g, Oral, Daily PRN, OuLang SnowNP   polyethylene glycol (MIRALAX / GLYCOLAX) packet 17 g, 17 g, Per Tube, Daily, KaFlora LippsMD, 17 g at 01/28/21 0901   propofol (DIPRIVAN) 1000 MG/100ML infusion, 0-50 mcg/kg/min, Intravenous, Continuous, KaFlora LippsMD, Last Rate: 13.92 mL/hr at 01/28/21 1200, 40 mcg/kg/min at 01/28/21 1200   Exam: Current vital signs: BP 132/78 (BP Location: Right Arm)    Pulse 91    Temp 97.9 F (36.6 C) (Esophageal)    Resp 14    Ht 5' 2"  (1.575 m)    Wt 57.1 kg    SpO2 100%    BMI 23.02 kg/m  Vital signs in last 24 hours: Temp:  [97.5 F (36.4 C)-99.7 F (37.6 C)] 97.9 F (36.6 C) (12/31 1200) Pulse Rate:  [74-123] 91 (12/31 1200) Resp:  [13-14] 14 (12/31 1200) BP: (89-137)/(49-78) 132/78 (12/31 1200) SpO2:  [100 %] 100 % (12/31 1217) FiO2 (%):  [28 %] 28 % (12/31 1217) Weight:  [57.1 kg] 57.1 kg (12/31 0336) General: Sedated with propofol, intubated HEENT: Normocephalic, atraumatic CVs: Regular rhythm Respiratory: Vented Abdomen nondistended nontender Extremities warm well perfused Neurological exam Was sedated with propofol intubated Propofol was held for exam Very shortly after stopping propofol, she started to move all her extremities and writhing movements. She did not follow commands She did attempt to track the examiner. IM unsure but at 1 point it did feel that she was able to open and close her eyes to command but did not raise her extremities up to command or hold them up and held up by the examiner. Cranial nerves: Pupils equal round react to light, unrestricted extraocular movements on oculocephalics, no gaze preference or deviation, does not blink to threat from either side, facial symmetry difficult to discern due to the ET tube. Motor examination: Spontaneously  moving all fours without focality.  Does not follow commands for individual muscle testing. Sensory exam: Strong withdrawal and grimace to noxious stimulation in all fours Coordination difficult to assess given the mentation above  Labs I have reviewed labs in epic and the results pertinent to this consultation are:  CBC    Component Value Date/Time   WBC 9.9 01/28/2021 0337   RBC 2.95 (L) 01/28/2021 0337   HGB 8.8 (L) 01/28/2021 0901   HGB 12.4 08/12/2011 0604   HCT 26.1 (L) 01/28/2021 0901   HCT 35.0 08/12/2011 0604   PLT 167 01/28/2021 0337   PLT 255 08/12/2011 0604   MCV 85.4 01/28/2021 0337   MCV 91 08/12/2011 0604   MCH 28.8 01/28/2021 0337   MCHC 33.7 01/28/2021 0337   RDW 16.5 (H) 01/28/2021 0337   RDW 12.5 08/12/2011 0604   LYMPHSABS 2.1 01/27/2021 0349   LYMPHSABS 1.2 08/12/2011 0604   MONOABS 0.6 01/27/2021 0349   MONOABS 0.4 08/12/2011 0604   EOSABS 0.1 01/27/2021 0349   EOSABS 0.2 08/12/2011 0604  BASOSABS 0.1 01/27/2021 0349   BASOSABS 0.1 08/12/2011 0604    CMP     Component Value Date/Time   NA 135 01/28/2021 0337   NA 136 01/01/2018 1138   NA 138 08/12/2011 0604   K 3.7 01/28/2021 0337   K 4.4 08/12/2011 0604   CL 105 01/28/2021 0337   CL 104 08/12/2011 0604   CO2 27 01/28/2021 0337   CO2 26 08/12/2011 0604   GLUCOSE 98 01/28/2021 0337   GLUCOSE 88 08/12/2011 0604   BUN 19 01/28/2021 0337   BUN 10 01/01/2018 1138   BUN 5 (L) 08/12/2011 0604   CREATININE 0.67 01/28/2021 0337   CREATININE 0.66 08/12/2011 0604   CALCIUM 8.1 (L) 01/28/2021 0337   CALCIUM 8.5 08/12/2011 0604   PROT 4.6 (L) 01/26/2021 0403   PROT 6.9 01/01/2018 1138   PROT 7.4 08/10/2011 1533   ALBUMIN 2.2 (L) 01/26/2021 0403   ALBUMIN 4.6 01/01/2018 1138   ALBUMIN 4.2 08/10/2011 1533   AST 23 01/26/2021 0403   AST 51 (H) 08/10/2011 1533   ALT 13 01/26/2021 0403   ALT 42 08/10/2011 1533   ALKPHOS 46 01/26/2021 0403   ALKPHOS 88 08/10/2011 1533   BILITOT 0.5 01/26/2021  0403   BILITOT 0.2 01/01/2018 1138   BILITOT 0.6 08/10/2011 1533   GFRNONAA >60 01/28/2021 0337   GFRNONAA >60 08/12/2011 0604   GFRAA 81 01/01/2018 1138   GFRAA >60 08/12/2011 0604    Lipid Panel     Component Value Date/Time   TRIG 80 01/27/2021 0349     Imaging I have reviewed the images obtained:  MRI examination of the brain-no acute changes.  Assessment: 65 year old brought in after having multiple syncopal episodes and possible seizure-like activity in the setting of acute blood loss anemia with a hemoglobin of 3.8, emergently intubated, duodenal ulcer likely etiology of the bleed which has been cauterized by GI, continues to be altered and difficult to wean off of the ventilator. MRI of the brain not suggestive of anoxic brain damage. Examination reveals intact brainstem function and florid purposeful withdrawal to noxious stimulation. I suspect that her current mentation is likely secondary to toxic metabolic encephalopathy from the acute anemia as well as combination of sedation and acute illness. Seizures and strokes should also be kept in differentials with the suspicion is low given negative MRI and no witnessed seizure after arrival to the emergency room.  The witnessed event of seizure-like activity noted by has been during syncope could very well be syncopal convulsions. Although her examination and imaging do not look consistent with anoxic brain injury, some amount of hypoxic brain injury or hypoperfusion still could have contributed to current mentation.  Impression -Multifactorial toxic metabolic encephalopathy -Evaluate for underlying stroke/seizure Other active issues Symptomatic anemia, GI bleed, acute hypoxic/hypercarbic respiratory failure, cardiac arrest likely secondary to demand ischemia, AKI  Recommendations: Try to switch sedation from propofol to Precedex. Continue wake-up assessments and SBT's. Try to minimize sedating medications as much as  possible Supportive care per primary team as you are and management of toxic metabolic derangements per primary team as you are. Exam and imaging are not dismal-expect improvement over time. I will follow-up again tomorrow. Plan discussed with Dr. Mortimer Fries in person.  -- Amie Portland, MD Neurologist [facility] Pager: [phone]  CRITICAL CARE ATTESTATION Performed by: Amie Portland, MD Total critical care time: 40 minutes Critical care time was exclusive of separately billable procedures and treating other patients and/or supervising APPs/Residents/Students Critical care  was necessary to treat or prevent imminent or life-threatening deterioration due to toxic metabolic encephalopathy, post-cardiac arrest This patient is critically ill and at significant risk for neurological worsening and/or death and care requires constant monitoring. Critical care was time spent personally by me on the following activities: development of treatment plan with patient and/or surrogate as well as nursing, discussions with consultants, evaluation of patient's response to treatment, examination of patient, obtaining history from patient or surrogate, ordering and performing treatments and interventions, ordering and review of laboratory studies, ordering and review of radiographic studies, pulse oximetry, re-evaluation of patient's condition, participation in multidisciplinary rounds and medical decision making of high complexity in the care of this patient.

## 2021-01-28 NOTE — Progress Notes (Signed)
GI Inpatient Follow-up Note  Subjective:  Patient seen in follow-up for posterior bulb visible vessel s/p EGD for endoscopic hemostasis 12/29. Patient is still intubated. Sedation attempted to wean off, but she became agitated. MRI brain performed last night within normal limits. No overt gastrointestinal blood loss. Hemoglobin stable at 8.8. Family bedside.   Scheduled Inpatient Medications:   chlorhexidine gluconate (MEDLINE KIT)  15 mL Mouth Rinse BID   Chlorhexidine Gluconate Cloth  6 each Topical Q0600   docusate  100 mg Per Tube BID   insulin aspart  0-9 Units Subcutaneous Q4H   mouth rinse  15 mL Mouth Rinse 10 times per day   mupirocin ointment  1 application Nasal BID   polyethylene glycol  17 g Per Tube Daily    Continuous Inpatient Infusions:    sodium chloride Stopped (01/26/21 0741)   norepinephrine (LEVOPHED) Adult infusion Stopped (01/27/21 0800)   pantoprazole 8 mg/hr (01/28/21 0931)   propofol (DIPRIVAN) infusion 30 mcg/kg/min (01/28/21 0914)    PRN Inpatient Medications:  docusate sodium, fentaNYL (SUBLIMAZE) injection, fentaNYL (SUBLIMAZE) injection, midazolam, polyethylene glycol  Review of Systems:  Unable to obtain. Patient intubated and sedated.    Physical Examination: BP 116/64 (BP Location: Right Arm)    Pulse 91    Temp (!) 97.5 F (36.4 C) (Esophageal)    Resp 14    Ht 5' 2"  (1.575 m)    Wt 57.1 kg    SpO2 100%    BMI 23.02 kg/m  Gen: Intubated. Sedated.  HEENT: PEERLA, EOMI, Neck: supple, no JVD or thyromegaly Chest: CTA bilaterally, no wheezes, crackles, or other adventitious sounds CV: RRR, no m/g/c/r Abd: soft, NT, ND, +BS in all four quadrants; no HSM, guarding, ridigity, or rebound tenderness Ext: no edema, well perfused with 2+ pulses, Skin: no rash or lesions noted Lymph: no LAD  Data: Lab Results  Component Value Date   WBC 9.9 01/28/2021   HGB 8.8 (L) 01/28/2021   HCT 26.1 (L) 01/28/2021   MCV 85.4 01/28/2021   PLT 167  01/28/2021   Recent Labs  Lab 01/27/21 2108 01/28/21 0337 01/28/21 0901  HGB 8.3* 8.5* 8.8*   Lab Results  Component Value Date   NA 135 01/28/2021   K 3.7 01/28/2021   CL 105 01/28/2021   CO2 27 01/28/2021   BUN 19 01/28/2021   CREATININE 0.67 01/28/2021   Lab Results  Component Value Date   ALT 13 01/26/2021   AST 23 01/26/2021   ALKPHOS 46 01/26/2021   BILITOT 0.5 01/26/2021   Recent Labs  Lab 01/26/21 0447  INR 1.8*   Assessment/Plan:  65 y/o Caucasian female with a Forrest Classification II peptic ulcer s/p epinephrine and bipolar cautery of posterior bulb visible vessel with Dr. Haig Prophet 12/29. No recurrent bleeding or hemodynamic instability.   Recommendations:  - Continue Protonix gtt for another day and then can switch to IV PPI BID. She will need PO Protonix BID upon d/c.  - Avoid all NSAIDs - No signs of recurrent bleeding. H&H stable currently.  - Continue to monitor serial H&H. Transfuse for Hgb <7.0.  - Advance tube feeds - Check H pylori stool antigen or serology and treat if positive  - Per intensivist's note, there is concern for anoxic brain injury. Meeting planned with family to update goals of care.  - GI following along with you  - Prognosis guarded  Please call with questions or concerns.   Octavia Bruckner, [facility] [phone]  Cell)

## 2021-01-28 NOTE — Progress Notes (Signed)
NAME:  Angela Espinoza, MRN:  [id number], DOB:  [birth year], LOS: 2 ADMISSION DATE:  01/26/2021 BRIEF SYNOPSIS 65 yo WF presented via private vehicle to the ED after having several syncopal episodes in as many days. Last night she developed diaphoresis and increased tone/stiffness, which prompted her husband to bring her to the ED. She again had syncope on arrival to the ED, and indeed was thought to be in PEA while still in the car. CPR was initiated, but within 15 seconds the patient was moving. She did require intubation. She was notably pale and indeed her presenting hemoglobin was 3.8 with a follow-up of 3.0 about 63mn later. There was not overt melena or hematochezia to account for this. A CT abd was performed revealed a large and deep ulceration at the duodenal bulb, usually peptic, ulcerated tumor is also considered.The GDA and gastroepiploic arteries are at the ulcer base and there is suspected blood clot within the dilated stomach. Her history is notable for Mobic. Two units of PRBC were transfused prior to her arrival in the ICU. Two more are pending. Levophed was started.    ER Course: CPR x15s50 ETT/MV Levophed 10 4 units of blood ordered, 2 transfused (finishing on arrival to ICU)   Significant Hospital Events: Including procedures, antibiotic start and stop dates in addition to other pertinent events   12/30 remains intubated,sedated 12/30 failed SAT/SBYTdue to delirium, MRI BRAIN WNL PER REPORT 12/31 remains intubated, awaiting for family       Antimicrobials:   Antibiotics Given (last 72 hours)     Date/Time Action Medication Dose Rate   01/26/21 0800 New Bag/Given  [pt just came from ED]   ceFEPIme (MAXIPIME) 2 g in sodium chloride 0.9 % 100 mL IVPB 2 g 200 mL/hr   01/26/21 0837 New Bag/Given   vancomycin (VANCOCIN) IVPB 1000 mg/200 mL premix 1,000 mg 200 mL/hr   01/26/21 02952New Bag/Given  [dose not given in ED]   metroNIDAZOLE (FLAGYL) IVPB 500 mg 500  mg 100 mL/hr   01/26/21 1213 New Bag/Given   cefTRIAXone (ROCEPHIN) 2 g in sodium chloride 0.9 % 100 mL IVPB 2 g 200 mL/hr            Interim History / Subjective:  Remains critically ill There is concern of anoxic brain injury despite NL MRI REPORT Remains on vent       Objective   Blood pressure (!) 104/53, pulse 77, temperature (!) 97.5 F (36.4 C), resp. rate 14, height 5' 2"  (1.575 m), weight 57.1 kg, SpO2 100 %.    Vent Mode: PRVC FiO2 (%):  [28 %] 28 % Set Rate:  [14 bmp-350 bmp] 14 bmp Vt Set:  [400 mL] 400 mL PEEP:  [5 cmH20] 5 cmH20 Pressure Support:  [5 cmH20-10 cmH20] 5 cmH20 Plateau Pressure:  [15 cmH20-16 cmH20] 15 cmH20   Intake/Output Summary (Last 24 hours) at 01/28/2021 0729 Last data filed at 01/28/2021 0400 Gross per 24 hour  Intake 415.91 ml  Output 860 ml  Net -444.09 ml    Filed Weights   01/26/21 0800 01/27/21 0320 01/28/21 0336  Weight: 55 kg 58.2 kg 57.1 kg    REVIEW OF SYSTEMS  PATIENT IS UNABLE TO PROVIDE COMPLETE REVIEW OF SYSTEMS DUE TO SEVERE CRITICAL ILLNESS AND TOXIC METABOLIC ENCEPHALOPATHY    PHYSICAL EXAMINATION:  GENERAL:critically ill appearing, +resp distress EYES: Pupils equal, round, reactive to light.  No scleral icterus.  MOUTH: Moist mucosal membrane. INTUBATED NECK: Supple.  PULMONARY: +  rhonchi, +wheezing CARDIOVASCULAR: S1 and S2.  No murmurs  GASTROINTESTINAL: Soft, nontender, -distended. Positive bowel sounds.  MUSCULOSKELETAL: No swelling, clubbing, or edema.  NEUROLOGIC: obtunded SKIN:intact,warm,dry     Labs/imaging that I havepersonally reviewed  (right click and "Reselect all SmartList Selections" daily)       ASSESSMENT AND PLAN SYNOPSIS  Admitted for Severe ACUTE Hypoxic and Hypercapnic Respiratory Failure due to acute blood loss anemia with severe demand ischemia causing acute cardiac arrest with multiorgan failure, I do have some concern about anoxic brain injury   Severe ACUTE  Hypoxic and Hypercapnic Respiratory Failure -continue Mechanical Ventilator support -continue Bronchodilator Therapy -Wean Fio2 and PEEP as tolerated -VAP/VENT bundle implementation -will perform SAT/SBT when respiratory parameters are met  Vent Mode: PRVC FiO2 (%):  [28 %] 28 % Set Rate:  [14 bmp-350 bmp] 14 bmp Vt Set:  [400 mL] 400 mL PEEP:  [5 cmH20] 5 cmH20 Pressure Support:  [5 cmH20-10 cmH20] 5 cmH20 Plateau Pressure:  [15 cmH20-16 cmH20] 15 cmH20  CARDIAC FAILURE-severe demand ischemia -oxygen as needed -Lasix as tolerated   CARDIAC ICU monitoring  ACUTE KIDNEY INJURY/Renal Failure -continue Foley Catheter-assess need -Avoid nephrotoxic agents -Follow urine output, BMP -Ensure adequate renal perfusion, optimize oxygenation -Renal dose medications   Intake/Output Summary (Last 24 hours) at 01/28/2021 0733 Last data filed at 01/28/2021 0400 Gross per 24 hour  Intake 415.91 ml  Output 860 ml  Net -444.09 ml    NEUROLOGY Acute toxic metabolic encephalopathy from hypoxia  Wean sedation and assess neuro status Possible anoxic brain injury   HYPOVOLUMIC SHOCK SOURCE-GIB -use vasopressors to keep MAP>65 as needed -follow ABG and LA   ENDO - ICU hypoglycemic\Hyperglycemia protocol -check FSBS per protocol   GI GI PROPHYLAXIS as indicated  NUTRITIONAL STATUS DIET-->TF's as tolerated Constipation protocol as indicated   ELECTROLYTES -follow labs as needed -replace as needed -pharmacy consultation and following    Best practice (right click and "Reselect all SmartList Selections" daily)  Diet:  NPO Pain/Anxiety/Delirium protocol (if indicated): Yes (RASS goal -1) VAP protocol (if indicated): Yes DVT prophylaxis: Contraindicated GI prophylaxis: PPI Glucose control:  SSI No Central venous access:  Yes, and it is still needed Arterial line:  N/A Foley:  Yes, and it is still needed Mobility:  bed rest  Code Status:  FULL CODE Disposition:  ICU  Labs   CBC: Recent Labs  Lab 01/26/21 0358 01/26/21 0403 01/26/21 0447 01/27/21 0349 01/27/21 1029 01/27/21 1531 01/27/21 2108 01/28/21 0337  WBC 17.0* 18.6*  --  11.3*  --   --   --  9.9  NEUTROABS  --  10.4*  --  8.4*  --   --   --   --   HGB 8.0* 3.8*   < > 8.7* 9.1* 8.5* 8.3* 8.5*  HCT 22.7* 13.7*   < > 24.6* 27.0* 24.9* 24.6* 25.2*  MCV 82.5 102.2*  --  80.4  --   --   --  85.4  PLT 247 491*  --  210  --   --   --  167   < > = values in this interval not displayed.     Basic Metabolic Panel: Recent Labs  Lab 01/26/21 0403 01/26/21 0720 01/27/21 0349 01/27/21 2154 01/28/21 0337  NA 138  --  137  --  135  K 4.2  --  2.8* 3.8 3.7  CL 106  --  104  --  105  CO2 10*  --  29  --  27  GLUCOSE 484*  --  103*  --  98  BUN 31*  --  24*  --  19  CREATININE 1.23*  --  0.92  --  0.67  CALCIUM 8.8*  --  7.5*  --  8.1*  MG  --  2.1 2.0  --  2.0  PHOS  --  4.2 2.8  --  3.4    GFR: Estimated Creatinine Clearance: 54.7 mL/min (by C-G formula based on SCr of 0.67 mg/dL). Recent Labs  Lab 01/26/21 0358 01/26/21 0403 01/26/21 0720 01/26/21 1407 01/26/21 2147 01/27/21 0349 01/28/21 0337  PROCALCITON  --   --  <0.10  --   --   --   --   WBC 17.0* 18.6*  --   --   --  11.3* 9.9  LATICACIDVEN  --  >9.0* 4.9* 1.4 0.9  --   --      Liver Function Tests: Recent Labs  Lab 01/26/21 0403  AST 23  ALT 13  ALKPHOS 46  BILITOT 0.5  PROT 4.6*  ALBUMIN 2.2*    Recent Labs  Lab 01/26/21 0403  LIPASE 27    No results for input(s): AMMONIA in the last 168 hours.  ABG    Component Value Date/Time   PHART 7.51 (H) 01/26/2021 1317   PCO2ART 40 01/26/2021 1317   PO2ART 141 (H) 01/26/2021 1317   HCO3 31.9 (H) 01/26/2021 1317   ACIDBASEDEF 24.5 (H) 01/26/2021 0415   O2SAT 99.4 01/26/2021 1317      Coagulation Profile: Recent Labs  Lab 01/26/21 0447  INR 1.8*     DVT/GI PRX  assessed I Assessed the need for Labs I Assessed the need for Foley I  Assessed the need for Central Venous Line Family Discussion when available I Assessed the need for Mobilization I made an Assessment of medications to be adjusted accordingly Safety Risk assessment completed  CASE DISCUSSED IN MULTIDISCIPLINARY ROUNDS WITH ICU TEAM     Critical Care Time devoted to patient care services described in this note is 45 minutes.  Critical care was necessary to treat /prevent imminent and life-threatening deterioration.    Corrin Parker, M.D.  [facility]  Medical Director [place] Director [facility] Cardio-Pulmonary Department

## 2021-01-29 ENCOUNTER — Encounter: Payer: Self-pay | Admitting: Pulmonary Disease

## 2021-01-29 ENCOUNTER — Other Ambulatory Visit: Payer: Self-pay

## 2021-01-29 ENCOUNTER — Inpatient Hospital Stay: Payer: Medicare HMO

## 2021-01-29 LAB — BASIC METABOLIC PANEL
Anion gap: 5 (ref 5–15)
BUN: 16 mg/dL (ref 8–23)
CO2: 24 mmol/L (ref 22–32)
Calcium: 8.1 mg/dL — ABNORMAL LOW (ref 8.9–10.3)
Chloride: 108 mmol/L (ref 98–111)
Creatinine, Ser: 0.6 mg/dL (ref 0.44–1.00)
GFR, Estimated: >60 mL/min (ref >60)
Glucose, Bld: 104 mg/dL — ABNORMAL HIGH (ref 70–99)
Potassium: 3.7 mmol/L (ref 3.5–5.1)
Sodium: 137 mmol/L (ref 135–145)

## 2021-01-29 LAB — GLUCOSE, CAPILLARY
Glucose-Capillary: 101 mg/dL — ABNORMAL HIGH (ref 70–99)
Glucose-Capillary: 101 mg/dL — ABNORMAL HIGH (ref 70–99)
Glucose-Capillary: 107 mg/dL — ABNORMAL HIGH (ref 70–99)
Glucose-Capillary: 109 mg/dL — ABNORMAL HIGH (ref 70–99)
Glucose-Capillary: 113 mg/dL — ABNORMAL HIGH (ref 70–99)
Glucose-Capillary: 90 mg/dL (ref 70–99)
Glucose-Capillary: 97 mg/dL (ref 70–99)

## 2021-01-29 LAB — CBC
HCT: 26.9 % — ABNORMAL LOW (ref 36.0–46.0)
Hemoglobin: 9.1 g/dL — ABNORMAL LOW (ref 12.0–15.0)
MCH: 28.7 pg (ref 26.0–34.0)
MCHC: 33.8 g/dL (ref 30.0–36.0)
MCV: 84.9 fL (ref 80.0–100.0)
Platelets: 188 10*3/uL (ref 150–400)
RBC: 3.17 MIL/uL — ABNORMAL LOW (ref 3.87–5.11)
RDW: 15.2 % (ref 11.5–15.5)
WBC: 11.6 10*3/uL — ABNORMAL HIGH (ref 4.0–10.5)
nRBC: 0 % (ref 0.0–0.2)

## 2021-01-29 LAB — HEMOGLOBIN AND HEMATOCRIT, BLOOD
HCT: 27.5 % — ABNORMAL LOW (ref 36.0–46.0)
HCT: 26.3 % — ABNORMAL LOW (ref 36.0–46.0)
HCT: 27.4 % — ABNORMAL LOW (ref 36.0–46.0)
HCT: 25.7 % — ABNORMAL LOW (ref 36.0–46.0)
Hemoglobin: 9 g/dL — ABNORMAL LOW (ref 12.0–15.0)
Hemoglobin: 8.8 g/dL — ABNORMAL LOW (ref 12.0–15.0)
Hemoglobin: 8.9 g/dL — ABNORMAL LOW (ref 12.0–15.0)
Hemoglobin: 8.5 g/dL — ABNORMAL LOW (ref 12.0–15.0)

## 2021-01-29 LAB — PROCALCITONIN: Procalcitonin: <0.1 ng/mL

## 2021-01-29 LAB — PHOSPHORUS: Phosphorus: 3.5 mg/dL (ref 2.5–4.6)

## 2021-01-29 LAB — MAGNESIUM: Magnesium: 1.8 mg/dL (ref 1.7–2.4)

## 2021-01-29 MED ORDER — SODIUM CHLORIDE 0.9 % IV SOLN
2.0000 g | Freq: Two times a day (BID) | INTRAVENOUS | Status: DC
  Administered 2021-01-29 (×2): 2 g via INTRAVENOUS
  Filled 2021-01-29 (×4): qty 2

## 2021-01-29 MED ORDER — PANTOPRAZOLE SODIUM 40 MG IV SOLR
40.0000 mg | Freq: Two times a day (BID) | INTRAVENOUS | Status: DC
Start: 2021-01-29 — End: 2021-01-30
  Administered 2021-01-29 (×2): 40 mg via INTRAVENOUS
  Filled 2021-01-29 (×2): qty 40

## 2021-01-29 MED ORDER — LABETALOL HCL 5 MG/ML IV SOLN
10.0000 mg | INTRAVENOUS | Status: DC | PRN
  Administered 2021-01-29: 10 mg via INTRAVENOUS
  Filled 2021-01-29: qty 4

## 2021-01-29 MED ORDER — METOPROLOL TARTRATE 25 MG PO TABS
12.5000 mg | ORAL_TABLET | Freq: Two times a day (BID) | ORAL | Status: DC
  Administered 2021-01-29: 12.5 mg
  Filled 2021-01-29: qty 1

## 2021-01-29 MED ORDER — MELATONIN 5 MG PO TABS
2.5000 mg | ORAL_TABLET | Freq: Every evening | ORAL | Status: DC | PRN
  Administered 2021-01-29 – 2021-01-30 (×2): 2.5 mg via ORAL
  Filled 2021-01-29 (×2): qty 1

## 2021-01-29 MED ORDER — NOREPINEPHRINE 4 MG/250ML-% IV SOLN: 0.0000 ug/min | INTRAVENOUS | Status: DC

## 2021-01-29 NOTE — Progress Notes (Signed)
Pharmacy Antibiotic Note  Angela Espinoza is a 66 y.o. female with h/o anxiety, depression, HTN, IBS, HLD, DDD and chronic NSAID use admitted on 01/26/2021 with syncope and AKI.  Pharmacy has been consulted for cefepime dosing for PNA.  Plan: start cefepime 2 grams IV every 12 hours   Height: 5' 2.01" (157.5 cm) Weight: 59 kg (130 lb 1.1 oz) IBW/kg (Calculated) : 50.12  Temp (24hrs), Avg:98.5 F (36.9 C), Min:97.5 F (36.4 C), Max:101.5 F (38.6 C)  Recent Labs  Lab 01/26/21 0358 01/26/21 0403 01/26/21 0720 01/26/21 1407 01/26/21 2147 01/27/21 0349 01/28/21 0337 01/29/21 0359 01/29/21 0530  WBC 17.0* 18.6*  --   --   --  11.3* 9.9  --  11.6*  CREATININE  --  1.23*  --   --   --  0.92 0.67 0.60  --   LATICACIDVEN  --  >9.0* 4.9* 1.4 0.9  --   --   --   --     Estimated Creatinine Clearance: 54.7 mL/min (by C-G formula based on SCr of 0.6 mg/dL).    Allergies  Allergen Reactions   Indapamide Other (See Comments)    tingling all over body    Antimicrobials this admission: 12/29 cefepime x 1 12/29 ceftriaxone x 1 12/29 metronidazole x 1 12/29 vancomycin x 1 01/01 cefepime >>   Microbiology results: 12/29 BCx: NG x 3 days 12/29 UCx: NG   Thank you for allowing pharmacy to be a part of this patients care.  Lowella Bandy 01/29/2021 7:08 AM

## 2021-01-29 NOTE — Consult Note (Signed)
PHARMACY CONSULT NOTE - FOLLOW UP  Pharmacy Consult for Electrolyte Monitoring and Replacement   Recent Labs: Potassium (mmol/L)  Date Value  01/29/2021 3.7  08/12/2011 4.4   Magnesium (mg/dL)  Date Value  29/56/2130 2.0  08/11/2011 2.2   Calcium (mg/dL)  Date Value  86/57/8469 8.1 (L)   Calcium, Total (mg/dL)  Date Value  62/95/2841 8.5   Albumin (g/dL)  Date Value  32/44/0102 2.2 (L)  01/01/2018 4.6  08/10/2011 4.2   Phosphorus (mg/dL)  Date Value  72/53/6644 3.4  08/10/2011 1.6 (L)   Sodium (mmol/L)  Date Value  01/29/2021 137  01/01/2018 136  08/12/2011 138     Assessment: 66yo female with PMH of Anxiety, Asthma, Depression, Hyperlipidemia, Hypertension, and IBS who was admitted to the hospital for syncope. Patient found to have hemoglobin 3.8 on admission, s/p transfusion and fluid resuscitation. EGD 12/29 with treatment of duodenal ulcer. Patient remains intubated in the ICU at this time. Pharmacy was consulted for electrolyte monitoring and replacement.   Free water 30 ml q4h TF @ 45 ml/hr    Goal of Therapy:  Electrolytes WNL  Plan:  --No replacement currently needed --Continue to monitor and replace electrolytes as clinically indicated   Angelique Blonder, PharmD Clinical Pharmacist 01/29/2021 9:34 AM

## 2021-01-29 NOTE — Plan of Care (Signed)
°  Problem: Education: Goal: Ability to identify signs and symptoms of gastrointestinal bleeding will improve Outcome: Not Progressing   Problem: Education: Goal: Knowledge of General Education information will improve Description: Including pain rating scale, medication(s)/side effects and non-pharmacologic comfort measures Outcome: Not Progressing   Problem: Activity: Goal: Risk for activity intolerance will decrease Outcome: Not Progressing

## 2021-01-29 NOTE — Plan of Care (Signed)
Patient had an eventful day, patient was extubated early in the shift and tolerated nasal cannula at 2L well.  Patient passed her swallow assessment.  B/P elevated this afternoon and prn B/P medication given.  Will continue to monitor patient.  Continuing plan of care.

## 2021-01-29 NOTE — Progress Notes (Addendum)
GI Inpatient Follow-up Note  Subjective:  Patient seen in follow-up for posterior bulb visible vessel s/p EGD for endoscopic hemostasis 12/29. Patient extubated this morning and off sedation. Neurology consulted yesterday due to concerns of anoxic brain injury, but exam and imaging not c/w anoxic brain injury and Neurology expects improvement over time. Sedation has been changed from propofol to Precedex. No overt gastrointestinal blood loss. Hemoglobin stable at 8.8. Family bedside - sister, husband, and daughter.   Scheduled Inpatient Medications:   chlorhexidine gluconate (MEDLINE KIT)  15 mL Mouth Rinse BID   Chlorhexidine Gluconate Cloth  6 each Topical Q0600   docusate  100 mg Per Tube BID   feeding supplement (VITAL HIGH PROTEIN)  1,000 mL Per Tube Q24H   free water  30 mL Per Tube Q4H   insulin aspart  0-9 Units Subcutaneous Q4H   mouth rinse  15 mL Mouth Rinse 10 times per day   mupirocin ointment  1 application Nasal BID   polyethylene glycol  17 g Per Tube Daily    Continuous Inpatient Infusions:    sodium chloride Stopped (01/26/21 0741)   ceFEPime (MAXIPIME) IV 2 g (01/29/21 1007)   dexmedetomidine (PRECEDEX) IV infusion 1 mcg/kg/hr (01/29/21 0786)   norepinephrine (LEVOPHED) Adult infusion     propofol (DIPRIVAN) infusion Stopped (01/29/21 0458)    PRN Inpatient Medications:  docusate sodium, fentaNYL (SUBLIMAZE) injection, fentaNYL (SUBLIMAZE) injection, midazolam, polyethylene glycol  Review of Systems:  Unable to obtain. Patient intubated and sedated.    Physical Examination: BP 140/76    Pulse (!) 109    Temp (!) 100.4 F (38 C) (Esophageal)    Resp (!) 22    Ht 5' 2.01" (1.575 m)    Wt 59 kg    SpO2 98%    BMI 23.78 kg/m  Gen: Alert, drowsy. No accessory muscle use.  HEENT: PEERLA, EOMI, Neck: supple, no JVD or thyromegaly Chest: CTA bilaterally, no wheezes, crackles, or other adventitious sounds CV: RRR, no m/g/c/r Abd: soft, NT, ND, +BS in all four  quadrants; no HSM, guarding, ridigity, or rebound tenderness Ext: no edema, well perfused with 2+ pulses, Skin: no rash or lesions noted Lymph: no LAD  Data: Lab Results  Component Value Date   WBC 11.6 (H) 01/29/2021   HGB 8.8 (L) 01/29/2021   HCT 26.3 (L) 01/29/2021   MCV 84.9 01/29/2021   PLT 188 01/29/2021   Recent Labs  Lab 01/29/21 0207 01/29/21 0530 01/29/21 0931  HGB 9.0* 9.1* 8.8*   Lab Results  Component Value Date   NA 137 01/29/2021   K 3.7 01/29/2021   CL 108 01/29/2021   CO2 24 01/29/2021   BUN 16 01/29/2021   CREATININE 0.60 01/29/2021   Lab Results  Component Value Date   ALT 13 01/26/2021   AST 23 01/26/2021   ALKPHOS 46 01/26/2021   BILITOT 0.5 01/26/2021   Recent Labs  Lab 01/26/21 0447  INR 1.8*   Assessment/Plan:  66 y/o Caucasian female with a Forrest Classification II peptic ulcer s/p epinephrine and bipolar cautery of posterior bulb visible vessel with Dr. Haig Prophet 12/29. No recurrent bleeding or hemodynamic instability.   Recommendations:  - Can change IV Protonix BID. Will switch to PO upon discharge.  - Avoid all NSAIDs - No signs of recurrent bleeding. H&H stable currently.  - Continue to monitor serial H&H. Transfuse for Hgb <7.0.  - If patient develops recurrent hemodynamically significant GI bleeding, she will need vascular consult for possible  empiric GDA embolization - Advance tube feeds - Check H pylori stool antigen or serology and treat if positive  - Continue supportive care per primary team - GI following along with you  Please call with questions or concerns.   Octavia Bruckner, PA-C [facility] [phone] (Cell)

## 2021-01-29 NOTE — Progress Notes (Addendum)
NAME:  Angela Espinoza, MRN:  [id number], DOB:  Sep 27, 1955, LOS: 3 ADMISSION DATE:  01/26/2021, INITIAL CONSULTATION DATE:  01/26/2021 REFERRING MD:  Lurline Hare MD, CHIEF COMPLAINT:  SYNCOPE AND COLLAPSE   Brief Patient Description  66 y.o female with PMH of Anxiety, Depression, HLD, HTN, and IBS presented to the ED with multiple episodes of syncope and collapse. Per husband, patient has been drinking a new supplement drink with unintentional 20 lbs weight loss over the past 3 weeks. She had been progressively getting weaker with multiple syncopal episode and diaphoresis per husband. On the morning of 12/29 at 3 am patient had a syncopal episode and collapsed.    ER Course:She was brought to the ED  and  Pulled out of the car pulseless with no respiratory effort. ROSC after 1 round of CPR.  Intubated for airway protection. Initial work up revealed hemoglobin 3.8>3.0.  A CT abd was performed revealed a large and deep ulceration at the duodenal bulb, usually peptic, ulcerated tumor is also considered.The GDA and gastroepiploic arteries are at the ulcer base and there is suspected blood clot within the dilated stomach. Family doesn't think she was having melenic stools at home but unsure. She did vomit yesterday but due to the color of the floors, unsure if any blood was in it. She takes meloxicam daily and is not on a PPI regularly. Two units of PRBC were transfused prior to her arrival in the ICU. Two more are pending. Levophed was started. 4 units of blood ordered, 2 transfused (finishing on arrival to ICU). PCCM consulted.  Pertinent  Medical History   has a past medical history of Anxiety, Asthma, Depression, Hyperlipidemia, Hypertension, and IBS (irritable bowel syndrome).  has a past surgical history that includes Oophorectomy; Exploratory laparotomy; Foot surgery; Colonoscopy; Tonsillectomy; Axillary lymph node biopsy (Right, 08/03/2014); Kyphoplasty (N/A, 12/10/2014); Abdominal hysterectomy  (age 24); Breast biopsy (Left, 10/07/2018); and Breast biopsy (Left, 10/21/2019). Significant Hospital Events: Including procedures, antibiotic start and stop dates in addition to other pertinent events   12/29: Admitted with Acute Blood Loss Anemia secondary to Acute GI Bleed  and hemorrhagic shock s/p emergent EGD 12/30 remains intubated AND sedated failed SAT/SBYTdue to delirium, MRI BRAIN WNL PER REPORT 12/31 remains intubated, awaiting for family 1/23: Extubated to [place]  Cultures:  12/29: SARS-CoV-2 PCR>> negative 12/29: Influenza PCR>> negative 12/29: Blood culture x2>>No growth thus far 12/29:Urine>>no growth 12/29: MRSA PCR>> negative   Antimicrobials:  12/29 cefepime x 1 12/29 ceftriaxone x 1 12/29 metronidazole x 1 12/29 vancomycin x 1 01/01 cefepime >>  Interim History / Subjective:  -Patient extubated to [place] -Off sedation -Alert and oriented x3 -Hemoglobin stable at 9.1 this am  OBJECTIVE   Blood pressure 120/60, pulse 89, temperature (!) 101.3 F (38.5 C), resp. rate 16, height 5' 2.01" (1.575 m), weight 59 kg, SpO2 100 %.    Vent Mode: PSV FiO2 (%):  [28 %] 28 % Set Rate:  [14 bmp] 14 bmp Vt Set:  [400 mL] 400 mL PEEP:  [5 cmH20] 5 cmH20 Pressure Support:  [8 cmH20] 8 cmH20   Intake/Output Summary (Last 24 hours) at 01/29/2021 0854 Last data filed at 01/29/2021 0458 Gross per 24 hour  Intake 533.17 ml  Output 1600 ml  Net -1066.83 ml   Filed Weights   01/27/21 0320 01/28/21 0336 01/29/21 0354  Weight: 58.2 kg 57.1 kg 59 kg    Examination: GENERAL:54 age-year-old patient lying in the bed with no acute distress.  EYES: Pupils equal, round, reactive to light and accommodation. No scleral icterus. Extraocular muscles intact.  HEENT: Head atraumatic, normocephalic. Oropharynx and nasopharynx clear.  NECK:  Supple, no jugular venous distention. No thyroid enlargement, no tenderness.  LUNGS: Normal breath sounds bilaterally, no wheezing, rales,rhonchi or  crepitation. No use of accessory muscles of respiration.  CARDIOVASCULAR: S1, S2 normal. No murmurs, rubs, or gallops.  ABDOMEN: Soft, nontender, nondistended. Bowel sounds present. No organomegaly or mass.  EXTREMITIES: No pedal edema, cyanosis, or clubbing.  NEUROLOGIC: Cranial nerves II through XII are intact. Muscle strength 5/5 in all extremities. Sensation intact. Gait not checked.  PSYCHIATRIC: The patient is alert and oriented x 3.  SKIN: No obvious rash, lesion, or ulcer.   Labs/imaging that I havepersonally reviewed  (right click and "Reselect all SmartList Selections" daily)    Labs   CBC: Recent Labs  Lab 01/26/21 0358 01/26/21 0403 01/26/21 0447 01/27/21 0349 01/27/21 1029 01/28/21 0337 01/28/21 0901 01/28/21 1501 01/28/21 1949 01/29/21 0207 01/29/21 0530  WBC 17.0* 18.6*  --  11.3*  --  9.9  --   --   --   --  11.6*  NEUTROABS  --  10.4*  --  8.4*  --   --   --   --   --   --   --   HGB 8.0* 3.8*   < > 8.7*   < > 8.5* 8.8* 8.4* 8.3* 9.0* 9.1*  HCT 22.7* 13.7*   < > 24.6*   < > 25.2* 26.1* 25.3* 24.8* 27.5* 26.9*  MCV 82.5 102.2*  --  80.4  --  85.4  --   --   --   --  84.9  PLT 247 491*  --  210  --  167  --   --   --   --  188   < > = values in this interval not displayed.    Basic Metabolic Panel: Recent Labs  Lab 01/26/21 0403 01/26/21 0720 01/27/21 0349 01/27/21 2154 01/28/21 0337 01/29/21 0359  NA 138  --  137  --  135 137  K 4.2  --  2.8* 3.8 3.7 3.7  CL 106  --  104  --  105 108  CO2 10*  --  29  --  27 24  GLUCOSE 484*  --  103*  --  98 104*  BUN 31*  --  24*  --  19 16  CREATININE 1.23*  --  0.92  --  0.67 0.60  CALCIUM 8.8*  --  7.5*  --  8.1* 8.1*  MG  --  2.1 2.0  --  2.0  --   PHOS  --  4.2 2.8  --  3.4  --    GFR: Estimated Creatinine Clearance: 54.7 mL/min (by C-G formula based on SCr of 0.6 mg/dL). Recent Labs  Lab 01/26/21 0403 01/26/21 0720 01/26/21 1407 01/26/21 2147 01/27/21 0349 01/28/21 0337 01/29/21 0359  01/29/21 0530  PROCALCITON  --  <0.10  --   --   --   --  <0.10  --   WBC 18.6*  --   --   --  11.3* 9.9  --  11.6*  LATICACIDVEN >9.0* 4.9* 1.4 0.9  --   --   --   --     Liver Function Tests: Recent Labs  Lab 01/26/21 0403  AST 23  ALT 13  ALKPHOS 46  BILITOT 0.5  PROT 4.6*  ALBUMIN 2.2*  Recent Labs  Lab 01/26/21 0403  LIPASE 27   No results for input(s): AMMONIA in the last 168 hours.  ABG    Component Value Date/Time   PHART 7.51 (H) 01/26/2021 1317   PCO2ART 40 01/26/2021 1317   PO2ART 141 (H) 01/26/2021 1317   HCO3 31.9 (H) 01/26/2021 1317   ACIDBASEDEF 24.5 (H) 01/26/2021 0415   O2SAT 99.4 01/26/2021 1317     Coagulation Profile: Recent Labs  Lab 01/26/21 0447  INR 1.8*    Cardiac Enzymes: No results for input(s): CKTOTAL, CKMB, CKMBINDEX, TROPONINI in the last 168 hours.  HbA1C: No results found for: HGBA1C  CBG: Recent Labs  Lab 01/28/21 1532 01/28/21 1916 01/28/21 2300 01/29/21 0346 01/29/21 0710  GLUCAP 80 101* 90 109* 90    Allergies Allergies  Allergen Reactions   Indapamide Other (See Comments)    tingling all over body     Home Medications  Prior to Admission medications   Medication Sig Start Date End Date Taking? Authorizing Provider  acetaminophen (TYLENOL) 500 MG tablet Take 500 mg by mouth every 6 (six) hours as needed.   Yes [provider]  Biotin 1000 MCG CHEW Chew 1,000 mcg by mouth daily.    Yes [provider]  BLACK ELDERBERRY PO Take 1 capsule by mouth daily.    Yes [provider]  Black Pepper-Turmeric 3-500 MG CAPS Take by mouth.   Yes [provider]  Calcium Carbonate-Vitamin D (CALCIUM 500 + D PO) Take 1 tablet by mouth daily. 08/18/18  Yes [provider]  DULoxetine (CYMBALTA) 30 MG capsule Take 30 mg by mouth 2 (two) times daily.   Yes [provider]  gabapentin (NEURONTIN) 300 MG capsule Take 300 mg by mouth 3 (three) times daily.   Yes [provider]  losartan (COZAAR) 25 MG tablet Take 25 mg by mouth every morning.   Yes [provider]  meloxicam (MOBIC) 15 MG tablet Take 15 mg by mouth daily. 03/20/20  Yes [provider]  Multiple Vitamin (MULTIVITAMIN) capsule Take 1 capsule by mouth daily.   Yes [provider]  Omega-3 Fatty Acids (FISH OIL PO) Take 1 tablet by mouth daily.   Yes [provider]  SENNA PLUS 8.6-50 MG tablet Take 1 tablet by mouth daily. 01/19/21  Yes [provider]  vitamin C (ASCORBIC ACID) 500 MG tablet Take 1,000 mg by mouth daily.    Yes [provider]  Scheduled Meds:  chlorhexidine gluconate (MEDLINE KIT)  15 mL Mouth Rinse BID   Chlorhexidine Gluconate Cloth  6 each Topical Q0600   docusate  100 mg Per Tube BID   feeding supplement (VITAL HIGH PROTEIN)  1,000 mL Per Tube Q24H   free water  30 mL Per Tube Q4H   insulin aspart  0-9 Units Subcutaneous Q4H   mouth rinse  15 mL Mouth Rinse 10 times per day   mupirocin ointment  1 application Nasal BID   polyethylene glycol  17 g Per Tube Daily   Continuous Infusions:  sodium chloride Stopped (01/26/21 0741)   ceFEPime (MAXIPIME) IV     dexmedetomidine (PRECEDEX) IV infusion 1 mcg/kg/hr (01/29/21 9233)   norepinephrine (LEVOPHED) Adult infusion Stopped (01/28/21 2046)   propofol (DIPRIVAN) infusion Stopped (01/29/21 0458)   PRN Meds:.docusate sodium, fentaNYL (SUBLIMAZE) injection, fentaNYL (SUBLIMAZE) injection, midazolam, polyethylene glycol  ASSESSMENT & PLAN  Acute Hypoxic Respiratory Failure secondary to Acute blood loss Anemia with hemorrhagic shock S/p extubation -Supplemental  O2 as needed to maintain O2 saturations 88 to 92% -Follow intermittent ABG and chest x-ray as needed  Acute Blood Loss Anemia secondary to Acute GI Bleed of the Duodenal Ulcer Hemorrhagic Shock S/p Endoscopy with epinephrine and bipolar cautery of posterior bulb visible vessel -IVF resuscitation and  Pressors to maintain MAP>65 -H&H monitoring q6h -Transfuse PRN Hgb<7 -NPO for now plan transition to clear liquid as tolerated pending Speech -If patient has hemodynamically significant bleeding will consider IR Consult for embolization) -Hold NSAIDs, steroids, ASA -Helicobacter pylori Ab + stool Ag.  If either positive, treat -Post-EGD Stabilization Complete PPI drip: Protonix 40 mg BID -GI Consult input appreciated  Acute Metabolic Encephalopathy in the setting of above~IMPROVED -Provide supportive care -MRI Brain negative for acute intracranial abnormality -Avoid sedatives as able -Neurology input appreciated  Hypertension -BP borderline low in the setting of acute blood loss anemia -Hold BP meds for now  Anxiety and Depression -Will resume home Cymbalta once able to tolerate po   Best practice (right click and "Reselect all SmartList Selections" daily)  Diet:  NPO Pain/Anxiety/Delirium protocol (if indicated): No VAP protocol (if indicated): Not indicated DVT prophylaxis: Contraindicated GI prophylaxis: PPI Glucose control:  SSI No Central venous access:  Yes, and it is still needed Arterial line:  N/A Foley:  Yes, and it is still needed Mobility:  bed rest  PT consulted: N/A Last date of multidisciplinary goals of care discussion [01/29/2021] Code Status:  full code Disposition: ICU  Critical care time: 35 minutes       Rufina Falco, DNP, FNP-C, AGACNP-BC Acute Care Nurse Practitioner  [facility] Pager: [phone] [place] [facility]

## 2021-01-29 NOTE — Progress Notes (Signed)
Pt extubated per MD order and placed on 3Lnc 

## 2021-01-29 NOTE — Progress Notes (Addendum)
Neurology Progress Note   S:// Seen and examined late morning Extubated and doing fairly well.   O:// Current vital signs: BP (!) 145/79    Pulse (!) 111    Temp (!) 100.4 F (38 C) (Esophageal)    Resp (!) 21    Ht 5' 2.01" (1.575 m)    Wt 59 kg    SpO2 98%    BMI 23.78 kg/m  Vital signs in last 24 hours: Temp:  [97.5 F (36.4 C)-101.5 F (38.6 C)] 100.4 F (38 C) (01/01 0900) Pulse Rate:  [61-120] 111 (01/01 1100) Resp:  [12-28] 21 (01/01 1100) BP: (79-152)/(51-111) 145/79 (01/01 1100) SpO2:  [93 %-100 %] 98 % (01/01 1100) FiO2 (%):  [28 %] 28 % (01/01 0900) Weight:  [59 kg] 59 kg (01/01 0354) Neurological exam Awake alert oriented x3 Voice is a little hoarse but no gross dysarthria. No aphasia Cranial nerves II through XII intact. Motor examination with symmetric strength in all fours Sensory exam intact to light touch Coordination exam with no dysmetria  General exam: Awake alert HEENT: Normocephalic/atraumatic, conjunctival pallor apparent. CVs: Regular rhythm Respiratory: Recently extubated, breathing well and saturating well on room air Abdomen nondistended nontender.   Medications  Current Facility-Administered Medications:    0.9 %  sodium chloride infusion, 250 mL, Intravenous, Continuous, Ouma, Bing Neighbors, NP, Stopped at 01/26/21 0741   ceFEPIme (MAXIPIME) 2 g in sodium chloride 0.9 % 100 mL IVPB, 2 g, Intravenous, Q12H, Dallie Piles, RPH, Stopped at 01/29/21 1037   chlorhexidine gluconate (MEDLINE KIT) (PERIDEX) 0.12 % solution 15 mL, 15 mL, Mouth Rinse, BID, Nelle Don, MD, 15 mL at 01/29/21 0800   Chlorhexidine Gluconate Cloth 2 % PADS 6 each, 6 each, Topical, Q0600, Nelle Don, MD, 6 each at 01/29/21 0400   dexmedetomidine (PRECEDEX) 400 MCG/100ML (4 mcg/mL) infusion, 0.4-1.2 mcg/kg/hr, Intravenous, Titrated, Flora Lipps, MD, Stopped at 01/29/21 0852   docusate (COLACE) 50 MG/5ML liquid 100 mg, 100 mg, Per Tube, BID, Flora Lipps,  MD, 100 mg at 01/28/21 2300   docusate sodium (COLACE) capsule 100 mg, 100 mg, Oral, BID PRN, Lang Snow, NP   feeding supplement (VITAL HIGH PROTEIN) liquid 1,000 mL, 1,000 mL, Per Tube, Q24H, Rust-Chester, Britton L, NP, 1,000 mL at 01/28/21 2125   fentaNYL (SUBLIMAZE) injection 25 mcg, 25 mcg, Intravenous, Q15 min PRN, Mortimer Fries, Maretta Bees, MD, 25 mcg at 01/29/21 0253   fentaNYL (SUBLIMAZE) injection 50-100 mcg, 50-100 mcg, Intravenous, Q2H PRN, Rust-Chester, Britton L, NP, 100 mcg at 01/28/21 2993   free water 30 mL, 30 mL, Per Tube, Q4H, Rust-Chester, Britton L, NP, 30 mL at 01/29/21 0353   insulin aspart (novoLOG) injection 0-9 Units, 0-9 Units, Subcutaneous, Q4H, Nelle Don, MD, 3 Units at 01/26/21 1208   MEDLINE mouth rinse, 15 mL, Mouth Rinse, 10 times per day, Nelle Don, MD, 15 mL at 01/29/21 1009   midazolam (VERSED) injection 1-2 mg, 1-2 mg, Intravenous, Q2H PRN, Rust-Chester, Britton L, NP, 2 mg at 01/29/21 0406   mupirocin ointment (BACTROBAN) 2 % 1 application, 1 application, Nasal, BID, Nelle Don, MD, 1 application at 71/69/67 2125   pantoprazole (PROTONIX) injection 40 mg, 40 mg, Intravenous, Q12H, Croley, Granville M, PA-C   polyethylene glycol (MIRALAX / GLYCOLAX) packet 17 g, 17 g, Oral, Daily PRN, Ouma, Bing Neighbors, NP   polyethylene glycol (MIRALAX / GLYCOLAX) packet 17 g, 17 g, Per Tube, Daily, Kasa, Kurian, MD, 17 g at 01/28/21 0901   propofol (DIPRIVAN) 1000 MG/100ML  infusion, 0-50 mcg/kg/min, Intravenous, Continuous, Kasa, Kurian, MD, Stopped at 01/29/21 0458 °Labs °CBC °   °Component Value Date/Time  ° WBC 11.6 (H) 01/29/2021 0530  ° RBC 3.17 (L) 01/29/2021 0530  ° HGB 8.8 (L) 01/29/2021 0931  ° HGB 12.4 08/12/2011 0604  ° HCT 26.3 (L) 01/29/2021 0931  ° HCT 35.0 08/12/2011 0604  ° PLT 188 01/29/2021 0530  ° PLT 255 08/12/2011 0604  ° MCV 84.9 01/29/2021 0530  ° MCV 91 08/12/2011 0604  ° MCH 28.7 01/29/2021 0530  ° MCHC 33.8 01/29/2021 0530  ° RDW  15.2 01/29/2021 0530  ° RDW 12.5 08/12/2011 0604  ° LYMPHSABS 2.1 01/27/2021 0349  ° LYMPHSABS 1.2 08/12/2011 0604  ° MONOABS 0.6 01/27/2021 0349  ° MONOABS 0.4 08/12/2011 0604  ° EOSABS 0.1 01/27/2021 0349  ° EOSABS 0.2 08/12/2011 0604  ° BASOSABS 0.1 01/27/2021 0349  ° BASOSABS 0.1 08/12/2011 0604  ° ° °CMP  °   °Component Value Date/Time  ° NA 137 01/29/2021 0359  ° NA 136 01/01/2018 1138  ° NA 138 08/12/2011 0604  ° K 3.7 01/29/2021 0359  ° K 4.4 08/12/2011 0604  ° CL 108 01/29/2021 0359  ° CL 104 08/12/2011 0604  ° CO2 24 01/29/2021 0359  ° CO2 26 08/12/2011 0604  ° GLUCOSE 104 (H) 01/29/2021 0359  ° GLUCOSE 88 08/12/2011 0604  ° BUN 16 01/29/2021 0359  ° BUN 10 01/01/2018 1138  ° BUN 5 (L) 08/12/2011 0604  ° CREATININE 0.60 01/29/2021 0359  ° CREATININE 0.66 08/12/2011 0604  ° CALCIUM 8.1 (L) 01/29/2021 0359  ° CALCIUM 8.5 08/12/2011 0604  ° PROT 4.6 (L) 01/26/2021 0403  ° PROT 6.9 01/01/2018 1138  ° PROT 7.4 08/10/2011 1533  ° ALBUMIN 2.2 (L) 01/26/2021 0403  ° ALBUMIN 4.6 01/01/2018 1138  ° ALBUMIN 4.2 08/10/2011 1533  ° AST 23 01/26/2021 0403  ° AST 51 (H) 08/10/2011 1533  ° ALT 13 01/26/2021 0403  ° ALT 42 08/10/2011 1533  ° ALKPHOS 46 01/26/2021 0403  ° ALKPHOS 88 08/10/2011 1533  ° BILITOT 0.5 01/26/2021 0403  ° BILITOT 0.2 01/01/2018 1138  ° BILITOT 0.6 08/10/2011 1533  ° GFRNONAA >60 01/29/2021 0359  ° GFRNONAA >60 08/12/2011 0604  ° GFRAA 81 01/01/2018 1138  ° GFRAA >60 08/12/2011 0604  ° ° °Imaging °I have reviewed images in epic and the results pertinent to this consultation are: °MRI brain with no acute changes ° °Assessment: 66-year-old with acute blood loss anemia with question of seizure-like activity in the setting of that acute blood loss anemia which was noted upon arrival with hemoglobin of 3.8.  Neurological consultation for encephalopathy and difficulty to wean. °Potential culprit of the GI bleed being worked up and managed by GI.  Transfused with stable hemoglobin now. °Extubated  today-neurological exam nonfocal and normal. °MRI brain without any evidence of hypoxic/anoxic brain damage. °Prolonged encephalopathy and difficulty to wean-likely secondary to severe anemia. °Questionable seizure activity could be syncopal convulsion-I would not recommend any antiepileptics at this time. °Now seems to be nearly back to her closer to baseline. ° °Recommendations: °No further inpatient neurological recommendations at this time. °Please call inpatient neurology if we can be of assistance °Plan discussed with the family at bedside. ° °-- °Ashish Arora, MD °Neurologist °[facility] °Pager: [phone] ° ° ° °

## 2021-01-30 LAB — CBC
HCT: 27.6 % — ABNORMAL LOW (ref 36.0–46.0)
Hemoglobin: 9.3 g/dL — ABNORMAL LOW (ref 12.0–15.0)
MCH: 28.8 pg (ref 26.0–34.0)
MCHC: 33.7 g/dL (ref 30.0–36.0)
MCV: 85.4 fL (ref 80.0–100.0)
Platelets: 253 10*3/uL (ref 150–400)
RBC: 3.23 MIL/uL — ABNORMAL LOW (ref 3.87–5.11)
RDW: 14.9 % (ref 11.5–15.5)
WBC: 10.5 10*3/uL (ref 4.0–10.5)
nRBC: 0 % (ref 0.0–0.2)

## 2021-01-30 LAB — GLUCOSE, CAPILLARY
Glucose-Capillary: 88 mg/dL (ref 70–99)
Glucose-Capillary: 101 mg/dL — ABNORMAL HIGH (ref 70–99)
Glucose-Capillary: 118 mg/dL — ABNORMAL HIGH (ref 70–99)

## 2021-01-30 LAB — BASIC METABOLIC PANEL
Anion gap: 8 (ref 5–15)
BUN: 8 mg/dL (ref 8–23)
CO2: 25 mmol/L (ref 22–32)
Calcium: 8.5 mg/dL — ABNORMAL LOW (ref 8.9–10.3)
Chloride: 103 mmol/L (ref 98–111)
Creatinine, Ser: 0.58 mg/dL (ref 0.44–1.00)
GFR, Estimated: >60 mL/min (ref >60)
Glucose, Bld: 99 mg/dL (ref 70–99)
Potassium: 2.8 mmol/L — ABNORMAL LOW (ref 3.5–5.1)
Sodium: 136 mmol/L (ref 135–145)

## 2021-01-30 LAB — MAGNESIUM
Magnesium: 2 mg/dL (ref 1.7–2.4)
Magnesium: 1.9 mg/dL (ref 1.7–2.4)

## 2021-01-30 LAB — TRIGLYCERIDES: Triglycerides: 86 mg/dL (ref <150)

## 2021-01-30 LAB — PROCALCITONIN: Procalcitonin: <0.1 ng/mL

## 2021-01-30 LAB — PHOSPHORUS
Phosphorus: 2.9 mg/dL (ref 2.5–4.6)
Phosphorus: 2.3 mg/dL — ABNORMAL LOW (ref 2.5–4.6)

## 2021-01-30 MED ORDER — GABAPENTIN 300 MG PO CAPS
300.0000 mg | ORAL_CAPSULE | Freq: Three times a day (TID) | ORAL | Status: DC
  Administered 2021-01-30 – 2021-01-31 (×3): 300 mg via ORAL
  Filled 2021-01-30 (×3): qty 1

## 2021-01-30 MED ORDER — POLYETHYLENE GLYCOL 3350 17 G PO PACK
17.0000 g | PACK | Freq: Every day | ORAL | Status: DC
  Administered 2021-01-30 – 2021-01-31 (×2): 17 g via ORAL
  Filled 2021-01-30: qty 1

## 2021-01-30 MED ORDER — POTASSIUM CHLORIDE CRYS ER 20 MEQ PO TBCR
40.0000 meq | EXTENDED_RELEASE_TABLET | ORAL | Status: AC
  Administered 2021-01-30 (×3): 40 meq via ORAL
  Filled 2021-01-30 (×3): qty 2

## 2021-01-30 MED ORDER — PANTOPRAZOLE SODIUM 40 MG PO TBEC
40.0000 mg | DELAYED_RELEASE_TABLET | Freq: Two times a day (BID) | ORAL | Status: DC
  Administered 2021-01-30 – 2021-01-31 (×3): 40 mg via ORAL
  Filled 2021-01-30 (×3): qty 1

## 2021-01-30 MED ORDER — ADULT MULTIVITAMIN W/MINERALS CH
1.0000 | ORAL_TABLET | Freq: Every day | ORAL | Status: DC
  Administered 2021-01-30 – 2021-01-31 (×2): 1 via ORAL
  Filled 2021-01-30 (×2): qty 1

## 2021-01-30 MED ORDER — INSULIN ASPART 100 UNIT/ML IJ SOLN: 0.0000 [IU] | Freq: Three times a day (TID) | INTRAMUSCULAR | Status: DC

## 2021-01-30 MED ORDER — DOCUSATE SODIUM 100 MG PO CAPS
100.0000 mg | ORAL_CAPSULE | Freq: Two times a day (BID) | ORAL | Status: DC
  Administered 2021-01-30 – 2021-01-31 (×3): 100 mg via ORAL
  Filled 2021-01-30 (×3): qty 1

## 2021-01-30 MED ORDER — DULOXETINE HCL 30 MG PO CPEP
30.0000 mg | ORAL_CAPSULE | Freq: Two times a day (BID) | ORAL | Status: DC
  Administered 2021-01-30 – 2021-01-31 (×2): 30 mg via ORAL
  Filled 2021-01-30 (×2): qty 1

## 2021-01-30 MED ORDER — METOPROLOL TARTRATE 25 MG PO TABS
12.5000 mg | ORAL_TABLET | Freq: Two times a day (BID) | ORAL | Status: DC
  Administered 2021-01-30 – 2021-01-31 (×3): 12.5 mg via ORAL
  Filled 2021-01-30 (×3): qty 1

## 2021-01-30 MED ORDER — ENSURE ENLIVE PO LIQD
237.0000 mL | Freq: Two times a day (BID) | ORAL | Status: DC
  Administered 2021-01-31 (×2): 237 mL via ORAL

## 2021-01-30 MED ORDER — POTASSIUM CHLORIDE 10 MEQ/50ML IV SOLN
10.0000 meq | INTRAVENOUS | Status: DC
  Administered 2021-01-30 (×3): 10 meq via INTRAVENOUS
  Filled 2021-01-30 (×4): qty 50

## 2021-01-30 MED ORDER — POTASSIUM CHLORIDE CRYS ER 20 MEQ PO TBCR
40.0000 meq | EXTENDED_RELEASE_TABLET | Freq: Once | ORAL | Status: AC
  Administered 2021-01-30: 40 meq via ORAL
  Filled 2021-01-30: qty 2

## 2021-01-30 NOTE — Progress Notes (Deleted)
PROVIDER NOTE: Information contained herein reflects review and annotations entered in association with encounter. Interpretation of such information and data should be left to medically-trained personnel. Information provided to patient can be located elsewhere in the medical record under "Patient Instructions". Document created using STT-dictation technology, any transcriptional errors that may result from process are unintentional.    Patient: Angela Espinoza  Service Category: E/M  Provider: Gaspar Cola, MD  DOB: 03/09/1955  DOS: 02/01/2021  Specialty: Interventional Pain Management  MRN: [id number]  Setting: Ambulatory outpatient  PCP: Ane Payment, PA  Type: Established Patient    Referring Provider: Marylen Ponto*  Location: Office  Delivery: Face-to-face     HPI  Ms. Angela Espinoza [place], a 66 y.o. year old female, is here today because of her No primary diagnosis found.. Angela Espinoza's primary complain today is No chief complaint on file. Last encounter: My last encounter with her was on 01/10/2021. Pertinent problems: Angela Espinoza has Chronic low back pain (1ry area of Pain) (Left) w/o sciatica; Chronic pain syndrome; History of L1 kyphoplasty; Traumatic compression fracture of L1 lumbar vertebra, sequela (2016); Spondylosis without myelopathy or radiculopathy, lumbar region; Lumbar facet syndrome (Left); DDD (degenerative disc disease), lumbosacral; Thoracic facet syndrome (Left); Spondylosis without myelopathy or radiculopathy, lumbosacral region; Spondylosis without myelopathy or radiculopathy, thoracic region; DDD (degenerative disc disease), thoracolumbar; Neurogenic pain; Other intervertebral disc degeneration, thoracic region; and Other intervertebral disc degeneration, lumbar region on their pertinent problem list. Pain Assessment: Severity of   is reported as a  /10. Location:    / . Onset:  . Quality:  . Timing:  . Modifying factor(s):   Marland Kitchen Vitals:  vitals were not taken for this visit.   Reason for encounter: evaluation of worsening, or previously known (established) problem.  ***  Pharmacotherapy Assessment  Analgesic: No opioid analgesics from our practice. MME/day: 0 mg/day   Monitoring: [place] PMP: PDMP reviewed during this encounter.       Pharmacotherapy: No side-effects or adverse reactions reported. Compliance: No problems identified. Effectiveness: Clinically acceptable.  No notes on file  UDS:  Summary  Date Value Ref Range Status  12/10/2017 FINAL  Final    Comment:    ==================================================================== TOX[facility]RE COMP DRUG ANALYSIS,UR ==================================================================== Test                             Result       Flag       Units Drug Present and Declared for Prescription Verification   Cyclobenzaprine                PRESENT      EXPECTED   Amitriptyline                  PRESENT      EXPECTED   Metoprolol                     PRESENT      EXPECTED Drug Absent but Declared for Prescription Verification   Desmethylcyclobenzaprine       Not Detected UNEXPECTED   Duloxetine                     Not Detected UNEXPECTED ==================================================================== Test                      Result    Flag   Units  Ref Range   Creatinine              28               mg/dL      >=20 ==================================================================== Declared Medications:  The flagging and interpretation on this report are based on the  following declared medications.  Unexpected results may arise from  inaccuracies in the declared medications.  **Note: The testing scope of this panel includes these medications:  Amitriptyline  Cyclobenzaprine  Duloxetine  Metoprolol  **Note: The testing scope of this panel does not include following  reported medications:  Cholecalciferol  Hydrochlorothiazide   Levocetirizine  Losartan (Losartan Potassium)  Magnesium  Meloxicam  Multivitamin  Supplement  Supplement (Omega-3)  Vitamin B (Biotin)  Vitamin C ==================================================================== For clinical consultation, please call [phone]. ====================================================================      ROS  Constitutional: Denies any fever or chills Gastrointestinal: No reported hemesis, hematochezia, vomiting, or acute GI distress Musculoskeletal: Denies any acute onset joint swelling, redness, loss of ROM, or weakness Neurological: No reported episodes of acute onset apraxia, aphasia, dysarthria, agnosia, amnesia, paralysis, loss of coordination, or loss of consciousness  Medication Review  Biotin, Black Elderberry, Black Pepper-Turmeric, Calcium Carb-Cholecalciferol, DULoxetine, Omega-3 Fatty Acids, acetaminophen, gabapentin, losartan, meloxicam, multivitamin, senna-docusate, and vitamin C  History Review  Allergy: Angela Espinoza is allergic to indapamide. Drug: Angela Espinoza  reports no history of drug use. Alcohol:  reports current alcohol use. Tobacco:  reports that she has never smoked. She has been exposed to tobacco smoke. She has never used smokeless tobacco. Social: Angela Espinoza  reports that she has never smoked. She has been exposed to tobacco smoke. She has never used smokeless tobacco. She reports current alcohol use. She reports that she does not use drugs. Medical:  has a past medical history of Anxiety, Asthma, Depression, Hyperlipidemia, Hypertension, and IBS (irritable bowel syndrome). Surgical: Angela Espinoza  has a past surgical history that includes Oophorectomy; Exploratory laparotomy; Foot surgery; Colonoscopy; Tonsillectomy; Axillary lymph node biopsy (Right, 08/03/2014); Kyphoplasty (N/A, 12/10/2014); Abdominal hysterectomy (age 66); Breast biopsy (Left, 10/07/2018); Breast biopsy (Left, 10/21/2019); and  Esophagogastroduodenoscopy (egd) with propofol (N/A, 01/26/2021). Family: family history includes Heart attack in her mother; Lung cancer in her father.  Laboratory Chemistry Profile   Renal Lab Results  Component Value Date   BUN 8 01/30/2021   CREATININE 0.58 01/30/2021   BCR 11 (L) 01/01/2018   GFRAA 81 01/01/2018   GFRNONAA >60 01/30/2021    Hepatic Lab Results  Component Value Date   AST 23 01/26/2021   ALT 13 01/26/2021   ALBUMIN 2.2 (L) 01/26/2021   ALKPHOS 46 01/26/2021   LIPASE 27 01/26/2021    Electrolytes Lab Results  Component Value Date   NA 136 01/30/2021   K 2.8 (L) 01/30/2021   CL 103 01/30/2021   CALCIUM 8.5 (L) 01/30/2021   MG 2.0 01/30/2021   PHOS 2.9 01/30/2021    Bone Lab Results  Component Value Date   25OHVITD1 40 01/01/2018   25OHVITD2 <1.0 01/01/2018   25OHVITD3 40 01/01/2018    Inflammation (CRP: Acute Phase) (ESR: Chronic Phase) Lab Results  Component Value Date   CRP 1 12/10/2017   ESRSEDRATE 3 12/10/2017   LATICACIDVEN 0.9 01/26/2021         Note: Above Lab results reviewed.  Recent Imaging Review  DG Chest Port 1 View CLINICAL DATA:  Intubation  EXAM: PORTABLE CHEST 1 VIEW  COMPARISON:  01/26/2021  FINDINGS: Endotracheal tube  with tip between the clavicular heads and carina. Enteric tube with tip at the stomach.  Asymmetric indistinct density around the left hilum and base. No edema, effusion, or pneumothorax.  IMPRESSION: Unremarkable hardware.  Mild atelectasis or infiltrate on the left.  Electronically Signed   By: Jorje Guild M.D.   On: 01/29/2021 07:55 Note: Reviewed        Physical Exam  General appearance: Well nourished, well developed, and well hydrated. In no apparent acute distress Mental status: Alert, oriented x 3 (person, place, & time)       Respiratory: No evidence of acute respiratory distress Eyes: PERLA Vitals: There were no vitals taken for this visit. BMI: Estimated body mass index  is 21.93 kg/m as calculated from the following:   Height as of 01/29/21: 5' 2.01" (1.575 m).   Weight as of 01/30/21: 119 lb 14.9 oz (54.4 kg). Ideal: Ideal body weight: 50.1 kg (110 lb 7.9 oz) Adjusted ideal body weight: 51.8 kg (114 lb 4.3 oz)  Assessment   Status Diagnosis  Controlled Controlled Controlled No diagnosis found.   Updated Problems: No problems updated.  Plan of Care  Problem-specific:  No problem-specific Assessment & Plan notes found for this encounter.  Ms. Kathlene Yano has a current medication list which includes the following long-term medication(s): calcium carb-cholecalciferol and losartan.  Pharmacotherapy (Medications Ordered): No orders of the defined types were placed in this encounter.  Orders:  No orders of the defined types were placed in this encounter.  Follow-up plan:   No follow-ups on file.     Interventional Therapies  Risk   Complexity Considerations:   WNL   Planned   Pending:   Therapeutic left thoracolumbar (T12, L1, L2, & L3) MB RFA #2    Under consideration:   Diagnostic left L1-2 LESI #1    Completed:   Diagnostic left thoracolumbar facet MBB (T12, L1, L2, & L3) block x2  Therapeutic left thoracolumbar (T12, L1, L2, & L3) MB RFA x2 (04/26/2020) (100/100/80) Diagnostic left lumbar facet MBB x2 (03/29/2020)  Diagnostic left lumbar facet (L4, L5, S1) MBB x1 (12/27/2020) (100/100/80/100)  Therapeutic left lumbar (L4, L5, S1) MB RFA x1 (07/28/2019) (100/100/100/100) Note: The patient has indicated that she has had prior lumbar facet radiofrequency ablations at [facility].   Palliative options:   Palliative left thoracolumbar (T12, L1, L2, & L3) MB RFA #2  Palliative left lumbar (L4, L5, S1) MB RFA #2       Recent Visits Date Type Provider Dept  01/10/21 Office Visit Milinda Pointer, MD [facility]  12/27/20 Procedure visit Milinda Pointer, MD [facility]  12/01/20 Procedure visit Gillis Santa, MD  [facility]  11/28/20 Office Visit Milinda Pointer, MD [facility]  Showing recent visits within past 90 days and meeting all other requirements Future Appointments Date Type Provider Dept  02/01/21 Appointment Milinda Pointer, MD [facility]  Showing future appointments within next 90 days and meeting all other requirements  I discussed the assessment and treatment plan with the patient. The patient was provided an opportunity to ask questions and all were answered. The patient agreed with the plan and demonstrated an understanding of the instructions.  Patient advised to call back or seek an in-person evaluation if the symptoms or condition worsens.  Duration of encounter: *** minutes.  Note by: Gaspar Cola, MD Date: 02/01/2021; Time: 12:20 PM

## 2021-01-30 NOTE — Evaluation (Addendum)
Clinical/Bedside Swallow Evaluation Patient Details  Name: Angela Espinoza MRN: [id number] Date of Birth: 1956/01/07  Today's Date: 01/30/2021 Time: SLP Start Time (ACUTE ONLY): 0830 SLP Stop Time (ACUTE ONLY): 0900 SLP Time Calculation (min) (ACUTE ONLY): 30 min  Past Medical History:  Past Medical History:  Diagnosis Date   Anxiety    Asthma    NO INHALER-SMOKE AND PERFUME TRIGGERS ASTHMA   Depression    Hyperlipidemia    Hypertension    IBS (irritable bowel syndrome)    Past Surgical History:  Past Surgical History:  Procedure Laterality Date   ABDOMINAL HYSTERECTOMY  age 6   AXILLARY LYMPH NODE BIOPSY Right 08/03/2014   Procedure: EXCISION RIGHT  AXILLARY LIPOMATOSIS;  Surgeon: Nadeen Landau, MD;  Location: [facility] ORS;  Service: General;  Laterality: Right;   BREAST BIOPSY Left 10/07/2018   Stereo, Coil Clip, BENIGN BREAST TISSUE WITH AREAS CONTAINING A PREDOMINANCE OF ADIPOSE TISSUE   BREAST BIOPSY Left 10/21/2019   stereo, ribbon clip, path pending    COLONOSCOPY     ESOPHAGOGASTRODUODENOSCOPY (EGD) WITH PROPOFOL N/A 01/26/2021   Procedure: ESOPHAGOGASTRODUODENOSCOPY (EGD) WITH PROPOFOL;  Surgeon: Regis Bill, MD;  Location: [facility] ENDOSCOPY;  Service: Endoscopy;  Laterality: N/A;   EXPLORATORY LAPAROTOMY     FOOT SURGERY     KYPHOPLASTY N/A 12/10/2014   Procedure: KYPHOPLASTY L 1;  Surgeon: Kennedy Bucker, MD;  Location: [facility] ORS;  Service: Orthopedics;  Laterality: N/A;   OOPHORECTOMY     TONSILLECTOMY     HPI:  Per Physician's H&P "66 yo WF presented via private vehicle to the ED after having several syncopal episodes in as many days. Last night she developed diaphoresis and increased tone/stiffness, which prompted her husband to bring her to the ED. She again had syncope on arrival to the ED, and indeed was thought to be in PEA while still in the car. CPR was initiated, but within 15 seconds the patient was moving. She did require intubation. She was  notably pale and indeed her presenting hemoglobin was 3.8 with a follow-up of 3.0 about later. There was not overt melena or hematochezia to account for this. A CT abd was performed revealed a large and deep ulceration at the duodenal bulb, usually peptic, ulcerated tumor is also considered.The GDA and gastroepiploic arteries are at the ulcer base and there is suspected blood clot within the dilated stomach. Her history is notable for Mobic. Two units of PRBC were transfused prior to her arrival in the ICU. Two more are pending. Levophed was started." Most recent CXR, 01/29/21 "Unremarkable hardware.     Mild atelectasis or infiltrate on the left."    Assessment / Plan / Recommendation  Clinical Impression  Pt seen for clinical swallowing evaluation. Pt alert, pleasant, and cooperative. Pt with perseverative output at times ("do you know my sister?"). Cleared with RN. Husband present for tail end of evalaution. Pt and husband deny pt with s/sx dysphagia PTA. Noted pt's Potassium (typically a larger pill) needed to be halved, but she had no other difficulty taking pills this admission.  Oral motor exam was unremarkable with exception of pt's vocal quality which was was c/b mildly reduced vocal loudness and was functional in a quiet environment.   Pt demonstrated an intact oral swallow across consistencies. Pharyngeal swallow appeared functional. To palpation, pt with seemingly timely swallow initiation and seemingly adequate laryngeal elevation. No change to vocal quality noted across trials. Pt with x1 dry cough between bites/sips which appeared  consistent with baseline dry cough; ?relationship to PO.  Of note, pt with difficulty scooping puree with dominant hand. ?R hand weakness. PT noted gross UE strength was functional. Pt may benefit from OT consult.  Recommend initiation of a regular diet with thin liquids with safe swallowing strategies/aspiration precautions as outlined below.   Given pt's  clinical presentation and clinical risk factors including recent intubation, mental status changes, deconditioning, and respiratory status, SLP to f/u x1 for diet tolerance.   Pt may also benefit from post-acute SLP services for cognition if cognitive changes persist.   Pt and husband made aware of role of SLP, diet recommendations, safe swallowing strategies/aspiration precautions, and SLP POC. Both verbalized understanding/agreement. RN updated re: diet recommendations, safe swallowing precautions/aspiration precautions, and SLP POC.   SLP Visit Diagnosis: Dysphagia, pharyngeal phase (R13.13)    Aspiration Risk  Mild aspiration risk    Diet Recommendation Regular;Thin liquid   Medication Administration: Whole meds with puree (consider halving/crushing larger pills) Supervision: Staff to assist with self feeding (encouragement for self-feeding; assistance with self-feeding PRN) Compensations: Minimize environmental distractions;Slow rate;Small sips/bites Postural Changes: Seated upright at 90 degrees;Remain upright for at least 30 minutes after po intake    Other  Recommendations Oral Care Recommendations: Oral care BID    Recommendations for follow up therapy are one component of a multi-disciplinary discharge planning process, led by the attending physician.  Recommendations may be updated based on patient status, additional functional criteria and insurance authorization.  Follow up Recommendations Home health SLP (may be indicated for cognition)      Assistance Recommended at Discharge Frequent or constant Supervision/Assistance  Functional Status Assessment Patient has had a recent decline in their functional status and demonstrates the ability to make significant improvements in function in a reasonable and predictable amount of time.  Frequency and Duration min 2x/week  2 weeks       Prognosis Prognosis for Safe Diet Advancement: Good      Swallow Study   General Date of  Onset: 01/26/21 HPI: Per Physician's H&P "66 yo WF presented via private vehicle to the ED after having several syncopal episodes in as many days. Last night she developed diaphoresis and increased tone/stiffness, which prompted her husband to bring her to the ED. She again had syncope on arrival to the ED, and indeed was thought to be in PEA while still in the car. CPR was initiated, but within 15 seconds the patient was moving. She did require intubation. She was notably pale and indeed her presenting hemoglobin was 3.8 with a follow-up of 3.0 about 45min later. There was not overt melena or hematochezia to account for this. A CT abd was performed revealed a large and deep ulceration at the duodenal bulb, usually peptic, ulcerated tumor is also considered.The GDA and gastroepiploic arteries are at the ulcer base and there is suspected blood clot within the dilated stomach. Her history is notable for Mobic. Two units of PRBC were transfused prior to her arrival in the ICU. Two more are pending. Levophed was started." Most recent CXR, 01/29/21 "Unremarkable hardware.     Mild atelectasis or infiltrate on the left." Type of Study: Bedside Swallow Evaluation Previous Swallow Assessment: unknown - denies hx of dysphagia Diet Prior to this Study: NPO Temperature Spikes Noted: No (not since extubation) Respiratory Status: Nasal cannula History of Recent Intubation: Yes Length of Intubations (days): 4 days (01/26/21-01/29/21) Date extubated: 01/29/21 Behavior/Cognition: Alert;Cooperative;Pleasant mood;Confused Oral Cavity Assessment: Within Functional Limits Oral Care Completed by  SLP: Yes Oral Cavity - Dentition: Adequate natural dentition Vision: Functional for self-feeding Self-Feeding Abilities: Needs assist;Needs set up Patient Positioning: Upright in bed Baseline Vocal Quality:  (mildly reduced vocal loudness; functional in quiet environment) Volitional Cough: Strong Volitional Swallow: Able to elicit     Oral/Motor/Sensory Function Overall Oral Motor/Sensory Function: Within functional limits   Thin Liquid Thin Liquid: Within functional limits Presentation: Cup;Straw;Self Fed Other Comments: ~6 oz    Puree Puree: Within functional limits Presentation: Spoon (needed assistance with feeding; ?due to position of pulse ox; ?due to mental status; ?due to R hand weakness) Other Comments: ~2 oz   Solid     Solid: Within functional limits Presentation: Self Fed Other Comments: x2 crackers; presented in halves     Clyde Canterbury, M.S., CCC-SLP Speech-Language Pathologist [facility] [phone] (ASCOM)   Woodroe Chen 01/30/2021,11:12 AM

## 2021-01-30 NOTE — Progress Notes (Signed)
Nutrition Follow-up  DOCUMENTATION CODES:   Not applicable  INTERVENTION:   -Ensure Enlive po BID, each supplement provides 350 kcal and 20 grams of protein  -MVI with minerals daily  NUTRITION DIAGNOSIS:   Inadequate oral intake related to inability to eat (pt sedated and ventilated) as evidenced by NPO status.  Progressing; advanced to PO diet on 01/30/21  GOAL:   Patient will meet greater than or equal to 90% of their needs  Progressing   MONITOR:   PO intake, Supplement acceptance, Labs, Weight trends, Skin, I & O's  REASON FOR ASSESSMENT:   Consult Enteral/tube feeding initiation and management  ASSESSMENT:   66 y/o female with h/o anxiety, depression, HTN, IBS, HLD, DDD and chronic NSAID use who is admitted with syncope and AKI.  1/1- extubated 1/2- advanced to regular diet  Reviewed I/O's: -1.3 L x 24 hours and +219 ml since admission  UOP: 1/9 L x 24 hours  Spoke with pt and husband at bedside. Pt shares that she had a decreased appetite for 2 weeks PTA secondary to early satiety. She tolerated breakfast well- consumed yogurt, applesauce, and few bites of bacon and pancake. Per her husband, pt's intake got progressively worse PTA (consumed only a few bites of soup the day prior to admission). Pt typically has a good appetite and consumes 2-3 meals per day- pt reports she goes out to eat a lot and she and her husband often shares entrees.  Per pt, she estimates she lost 10-20# over the past few months, which she attributes to switching to Zevia drinks and acute illness. Reviewed wt hx; pt has experienced a 7.8% wt loss over the past 6 weeks, which is significant for time frame.   Discussed importance of good meal and supplement intake to promote healing. Pt amenable to Ensure supplements.    Medications reviewed and include colace,miralax, and potassium chloride.  Labs reviewed: K: 2.8., CBGS: 88-113 (inpatient orders for glycemic control are 0-9 units insulin  aspart TID with meals).    Diet Order:   Diet Order             Diet regular Room service appropriate? Yes; Fluid consistency: Thin  Diet effective now                   EDUCATION NEEDS:   Education needs have been addressed  Skin:  Skin Assessment: Reviewed RN Assessment  Last BM:  Unknown  Height:   Ht Readings from Last 1 Encounters:  01/29/21 5' 2.01" (1.575 m)    Weight:   Wt Readings from Last 1 Encounters:  01/30/21 54.4 kg    Ideal Body Weight:  50 kg  BMI:  Body mass index is 21.93 kg/m.  Estimated Nutritional Needs:   Kcal:  1650-1850  Protein:  85-100 grams  Fluid:  > 1.6 L    Levada Schilling, RD, LDN, CDCES Registered Dietitian II Certified Diabetes Care and Education Specialist Please refer to [facility] for RD and/or RD on-call/weekend/after hours pager

## 2021-01-30 NOTE — Consult Note (Signed)
PHARMACY CONSULT NOTE - FOLLOW UP  Pharmacy Consult for Electrolyte Monitoring and Replacement   Recent Labs: Potassium (mmol/L)  Date Value  01/30/2021 2.8 (L)  08/12/2011 4.4   Magnesium (mg/dL)  Date Value  23/53/6144 2.0  08/11/2011 2.2   Calcium (mg/dL)  Date Value  31/54/0086 8.5 (L)   Calcium, Total (mg/dL)  Date Value  76/19/5093 8.5   Albumin (g/dL)  Date Value  26/71/2458 2.2 (L)  01/01/2018 4.6  08/10/2011 4.2   Phosphorus (mg/dL)  Date Value  09/98/3382 2.9  08/10/2011 1.6 (L)   Sodium (mmol/L)  Date Value  01/30/2021 136  01/01/2018 136  08/12/2011 138     Assessment: 65yo female with PMH of Anxiety, Asthma, Depression, Hyperlipidemia, Hypertension, and IBS who was admitted to the hospital for syncope. Patient found to have hemoglobin 3.8 on admission, s/p transfusion and fluid resuscitation. EGD 12/29 with treatment of duodenal ulcer. Patient remains intubated in the ICU at this time. Pharmacy was consulted for electrolyte monitoring and replacement.   Patient extubated 01/29/21  Goal of Therapy:  Electrolytes within normal limits  Plan:  --K 2.8, PO Kcl 40 mEq q4h x 3 doses per primary. Already given PO Kcl 40 mEq x 1 this AM (total daily K+ replacement: 160 mEq) --Patient care transferred from P[facility]M to [facility]. Will discontinue electrolyte consult at this time. Will defer further replacement and ordering of labs to hospitalist --Pharmacy will continue to monitor peripherally  Tressie Ellis 01/30/2021 7:58 AM

## 2021-01-30 NOTE — Progress Notes (Signed)
PROGRESS NOTE    Leafy KindleBrenda Smith Espinoza  WUJ:[id number] DOB: 07-24-1955 DOA: 01/26/2021 PCP: Delman Cheadleasmussen, Elizabeth Moore, PA   Brief Narrative:  66 y.o female with PMH of Anxiety, Depression, HLD, HTN, and IBS presented to the ED with multiple episodes of syncope and collapse -found to have hemoglobin of 3.8 subsequently 3.0 at intake with suspected hemorrhagic shock.  GI consulted status post endoscopy and stabilizing on PPI drip after emergent blood transfusion in the ICU.  At this time patient is now extubated hemoglobin remained stable around 9.3, other than mild hypokalemia her labs are otherwise markedly improving, symptomatically she appears to be close to baseline other than some ongoing ambulatory dysfunction and weakness due to prolonged hospitalization and bedbound status.  Questionable episode of seizure-like activity versus syncopal convulsions per neurology given profound anemia, less likely seizure given evaluation findings here.  12/29: Admitted with Acute Blood Loss Anemia secondary to Acute GI Bleed  and hemorrhagic shock s/p emergent EGD 12/30 remains intubated AND sedated failed SAT/SBYTdue to delirium, MRI BRAIN WNL PER REPORT 12/31 remains intubated, awaiting for family 1/1: Extubated to [place] 1/2 -continues to progress with PT, mild hypokalemia, continue to follow labs; dispo planning  Assessment & Plan:    Acute Blood Loss Anemia secondary to Acute GI Bleed of the Duodenal Ulcer Hemorrhagic Shock S/p Endoscopy with epinephrine and bipolar cautery of posterior bulb visible vessel -PCCM and GI admitting, resolving now no longer intubated or requiring pressors -Emergent EGD with successful cautery on intake -Status posttransfusion at intake requiring 2 unit PRBC 2 unit FFP 01/26/2021 -Follow repeat hemoglobin/follow for signs or symptoms of worsening anemia or recurrent GI bleed  Acute Hypoxic Respiratory Failure secondary hemorrhagic shock S/p extubation -Extubated  successfully 01/29/2021  -Continue to wean oxygen as tolerated -currently on room air   Acute Metabolic Encephalopathy secondary to shock as above Rule out seizure-like activity vs syncopal convulsion -Provide supportive care -MRI Brain negative for acute intracranial abnormality -Avoid sedatives as able -Neurology input appreciated -symptoms more consistent with profound anemia and hemorrhagic shock.  No indication for antiepileptics at this time.   Hypertension, shock as above -Continue to hold home blood pressure medications, slowly titrate over the next 24 hours pending rebound hypertension now that shock is resolving   Anxiety and Depression -Resume twice daily Cymbalta at previous home dose   DVT prophylaxis: SCDs, early ambulation given above Code Status: Full Family Communication: Husband at bedside  Status is: Inpatient  Dispo: The patient is from: Home              Anticipated d/c is to: Home with home health PT, rolling walker              Anticipated d/c date is: 24 to 48 hours pending clinical course              Patient currently not medically stable for discharge  Consultants:  GI, PCCM  Procedures:  Upper endoscopy/emergent cautery as above  Antimicrobials:  None indicated  Subjective: No acute issues or events overnight denies nausea vomiting diarrhea constipation headache fevers chills or chest pain  Objective: Vitals:   01/30/21 0300 01/30/21 0400 01/30/21 0500 01/30/21 0600  BP: (!) 155/89 (!) 158/88 (!) 159/95   Pulse: 96 98 100   Resp: 19 19 16    Temp:  98 F (36.7 C)    TempSrc:      SpO2: 96% 97% 97%   Weight:    54.4 kg  Height:  Intake/Output Summary (Last 24 hours) at 01/30/2021 0753 Last data filed at 01/30/2021 0534 Gross per 24 hour  Intake 598.52 ml  Output 1850 ml  Net -1251.48 ml   Filed Weights   01/28/21 0336 01/29/21 0354 01/30/21 0600  Weight: 57.1 kg 59 kg 54.4 kg    Examination:  General:  Pleasantly resting in  bed, No acute distress. HEENT:  Normocephalic atraumatic.  Sclerae nonicteric, noninjected.  Extraocular movements intact bilaterally. Neck:  Without mass or deformity.  Trachea is midline. Lungs:  Clear to auscultate bilaterally without rhonchi, wheeze, or rales. Heart:  Regular rate and rhythm.  Without murmurs, rubs, or gallops. Abdomen:  Soft, nontender, nondistended.  Without guarding or rebound. Extremities: Without cyanosis, clubbing, edema, or obvious deformity. Vascular:  Dorsalis pedis and posterior tibial pulses palpable bilaterally. Skin:  Warm and dry, no erythema, no ulcerations.  Data Reviewed: I have personally reviewed following labs and imaging studies  CBC: Recent Labs  Lab 01/26/21 0403 01/26/21 0447 01/27/21 0349 01/27/21 1029 01/28/21 0337 01/28/21 0901 01/29/21 0530 01/29/21 0931 01/29/21 1501 01/29/21 2159 01/30/21 0321  WBC 18.6*  --  11.3*  --  9.9  --  11.6*  --   --   --  10.5  NEUTROABS 10.4*  --  8.4*  --   --   --   --   --   --   --   --   HGB 3.8*   < > 8.7*   < > 8.5*   < > 9.1* 8.8* 8.9* 8.5* 9.3*  HCT 13.7*   < > 24.6*   < > 25.2*   < > 26.9* 26.3* 27.4* 25.7* 27.6*  MCV 102.2*  --  80.4  --  85.4  --  84.9  --   --   --  85.4  PLT 491*  --  210  --  167  --  188  --   --   --  253   < > = values in this interval not displayed.   Basic Metabolic Panel: Recent Labs  Lab 01/26/21 0403 01/26/21 0720 01/27/21 0349 01/27/21 2154 01/28/21 0337 01/29/21 0359 01/29/21 1700 01/30/21 0321  NA 138  --  137  --  135 137  --  136  K 4.2  --  2.8* 3.8 3.7 3.7  --  2.8*  CL 106  --  104  --  105 108  --  103  CO2 10*  --  29  --  27 24  --  25  GLUCOSE 484*  --  103*  --  98 104*  --  99  BUN 31*  --  24*  --  19 16  --  8  CREATININE 1.23*  --  0.92  --  0.67 0.60  --  0.58  CALCIUM 8.8*  --  7.5*  --  8.1* 8.1*  --  8.5*  MG  --  2.1 2.0  --  2.0  --  1.8 2.0  PHOS  --  4.2 2.8  --  3.4  --  3.5 2.9   GFR: Estimated Creatinine Clearance:  54.7 mL/min (by C-G formula based on SCr of 0.58 mg/dL). Liver Function Tests: Recent Labs  Lab 01/26/21 0403  AST 23  ALT 13  ALKPHOS 46  BILITOT 0.5  PROT 4.6*  ALBUMIN 2.2*   Recent Labs  Lab 01/26/21 0403  LIPASE 27   No results for input(s): AMMONIA in the last 168 hours.  Coagulation Profile: Recent Labs  Lab 01/26/21 0447  INR 1.8*   Cardiac Enzymes: No results for input(s): CKTOTAL, CKMB, CKMBINDEX, TROPONINI in the last 168 hours. BNP (last 3 results) No results for input(s): PROBNP in the last 8760 hours. HbA1C: No results for input(s): HGBA1C in the last 72 hours. CBG: Recent Labs  Lab 01/29/21 1858 01/29/21 1949 01/29/21 2346 01/30/21 0401 01/30/21 0734  GLUCAP 101* 107* 97 88 101*   Lipid Profile: Recent Labs    01/30/21 0321  TRIG 86   Thyroid Function Tests: No results for input(s): TSH, T4TOTAL, FREET4, T3FREE, THYROIDAB in the last 72 hours. Anemia Panel: Recent Labs    01/28/21 1501  VITAMINB12 650   Sepsis Labs: Recent Labs  Lab 01/26/21 0403 01/26/21 0720 01/26/21 1407 01/26/21 2147 01/29/21 0359 01/30/21 0321  PROCALCITON  --  <0.10  --   --  <0.10 <0.10  LATICACIDVEN >9.0* 4.9* 1.4 0.9  --   --     Recent Results (from the past 240 hour(s))  Resp Panel by RT-PCR (Flu A&B, Covid) Nasopharyngeal Swab     Status: None   Collection Time: 01/26/21  4:03 AM   Specimen: Nasopharyngeal Swab; Nasopharyngeal(NP) swabs in vial transport medium  Result Value Ref Range Status   SARS Coronavirus 2 by RT PCR NEGATIVE NEGATIVE Final    Comment: (NOTE) SARS-CoV-2 target nucleic acids are NOT DETECTED.  The SARS-CoV-2 RNA is generally detectable in upper respiratory specimens during the acute phase of infection. The lowest concentration of SARS-CoV-2 viral copies this assay can detect is 138 copies/mL. A negative result does not preclude SARS-Cov-2 infection and should not be used as the sole basis for treatment or other patient  management decisions. A negative result may occur with  improper specimen collection/handling, submission of specimen other than nasopharyngeal swab, presence of viral mutation(s) within the areas targeted by this assay, and inadequate number of viral copies(<138 copies/mL). A negative result must be combined with clinical observations, patient history, and epidemiological information. The expected result is Negative.  Fact Sheet for Patients:  BloggerCourse.comhttps://www.fda.gov/media/152166/download  Fact Sheet for Healthcare Providers:  SeriousBroker.ithttps://www.fda.gov/media/152162/download  This test is no t yet approved or cleared by the [place] FDA and  has been authorized for detection and/or diagnosis of SARS-CoV-2 by FDA under an Emergency Use Authorization (EUA). This EUA will remain  in effect (meaning this test can be used) for the duration of the COVID-19 declaration under Section 564(b)(1) of the Act, 21 U.S.C.section 360bbb-3(b)(1), unless the authorization is terminated  or revoked sooner.       Influenza A by PCR NEGATIVE NEGATIVE Final   Influenza B by PCR NEGATIVE NEGATIVE Final    Comment: (NOTE) The Xpert Xpress SARS-CoV-2/FLU/RSV plus assay is intended as an aid in the diagnosis of influenza from Nasopharyngeal swab specimens and should not be used as a sole basis for treatment. Nasal washings and aspirates are unacceptable for Xpert Xpress SARS-CoV-2/FLU/RSV testing.  Fact Sheet for Patients: BloggerCourse.comhttps://www.fda.gov/media/152166/download  Fact Sheet for Healthcare Providers: SeriousBroker.ithttps://www.fda.gov/media/152162/download  This test is not yet approved or cleared by the [place] FDA and has been authorized for detection and/or diagnosis of SARS-CoV-2 by FDA under an Emergency Use Authorization (EUA). This EUA will remain in effect (meaning this test can be used) for the duration of the COVID-19 declaration under Section 564(b)(1) of the Act, 21 U.S.C. section 360bbb-3(b)(1),  unless the authorization is terminated or revoked.  Performed at [facility], [address] [place], [place]  [address]   Culture, blood (routine x 2)     Status: None (Preliminary result)   Collection Time: 01/26/21  4:47 AM   Specimen: BLOOD  Result Value Ref Range Status   Specimen Description BLOOD LEFT ANTECUBITAL  Final   Special Requests   Final    BOTTLES DRAWN AEROBIC AND ANAEROBIC Blood Culture results may not be optimal due to an inadequate volume of blood received in culture bottles   Culture   Final    NO GROWTH 3 DAYS Performed at [facility], [address], [place], [place] [address]    Report Status PENDING  Incomplete  Culture, blood (routine x 2)     Status: None (Preliminary result)   Collection Time: 01/26/21  4:47 AM   Specimen: BLOOD  Result Value Ref Range Status   Specimen Description BLOOD BLOOD LEFT HAND  Final   Special Requests   Final    BOTTLES DRAWN AEROBIC AND ANAEROBIC Blood Culture results may not be optimal due to an inadequate volume of blood received in culture bottles   Culture   Final    NO GROWTH 3 DAYS Performed at [facility], [address], [place], [place] [address]    Report Status PENDING  Incomplete  Urine Culture     Status: None   Collection Time: 01/26/21  4:47 AM   Specimen: Urine, Clean Catch  Result Value Ref Range Status   Specimen Description   Final    URINE, CLEAN CATCH Performed at [facility], [address], [place], [place] [address]    Special Requests   Final    NONE Performed at [facility], [address] [place], [place] [address]    Culture   Final    NO GROWTH Performed at [facility], [address] [place], [place] [address]    Report Status 01/27/2021 FINAL  Final  MRSA Next Gen by PCR, Nasal     Status: None   Collection Time: 01/26/21 10:00 AM   Specimen: Nasal Mucosa; Nasal Swab  Result Value Ref Range Status   MRSA by PCR Next  Gen NOT DETECTED NOT DETECTED Final    Comment: (NOTE) The GeneXpert MRSA Assay (FDA approved for NASAL specimens only), is one component of a comprehensive MRSA colonization surveillance program. It is not intended to diagnose MRSA infection nor to guide or monitor treatment for MRSA infections. Test performance is not FDA approved in patients less than 91 years old. Performed at [facility], [address], [place], [place] [address]          Radiology Studies: DG Chest [place] 1 View  Result Date: 01/29/2021 CLINICAL DATA:  Intubation EXAM: PORTABLE CHEST 1 VIEW COMPARISON:  01/26/2021 FINDINGS: Endotracheal tube with tip between the clavicular heads and carina. Enteric tube with tip at the stomach. Asymmetric indistinct density around the left hilum and base. No edema, effusion, or pneumothorax. IMPRESSION: Unremarkable hardware. Mild atelectasis or infiltrate on the left. Electronically Signed   By: Tiburcio Pea M.D.   On: 01/29/2021 07:55    Scheduled Meds:  Chlorhexidine Gluconate Cloth  6 each Topical Q0600   docusate  100 mg Per Tube BID   insulin aspart  0-9 Units Subcutaneous Q4H   metoprolol tartrate  12.5 mg Per Tube BID   mupirocin ointment  1 application Nasal BID   pantoprazole (PROTONIX) IV  40 mg Intravenous Q12H   polyethylene glycol  17 g Per Tube  Daily   potassium chloride  40 mEq Oral Q4H   Continuous Infusions:  sodium chloride Stopped (01/26/21 0741)   ceFEPime (MAXIPIME) IV 2 g (01/29/21 2037)   dexmedetomidine (PRECEDEX) IV infusion Stopped (01/29/21 0852)    LOS: 4 days   Time spent:  Azucena Fallen, DO [facility]  If 7PM-7AM, please contact night-coverage [email or web address]  01/30/2021, 7:53 AM

## 2021-01-30 NOTE — Progress Notes (Signed)
GI Inpatient Follow-up Note  Subjective:  Patient seen in follow-up for posterior bulb visible vessel s/p EGD for endoscopic hemostasis 12/29. She was extubated yesterday and her clinical status has continued to improve. She passed swallowing assessment. She offers no specific complaints. Husband at bedside.   Scheduled Inpatient Medications:   Chlorhexidine Gluconate Cloth  6 each Topical Q0600   docusate sodium  100 mg Oral BID   insulin aspart  0-9 Units Subcutaneous TID WC   metoprolol tartrate  12.5 mg Oral BID   mupirocin ointment  1 application Nasal BID   pantoprazole  40 mg Oral BID   polyethylene glycol  17 g Oral Daily   potassium chloride  40 mEq Oral Q4H    Continuous Inpatient Infusions:    sodium chloride Stopped (01/26/21 0741)    PRN Inpatient Medications:  docusate sodium, labetalol, melatonin, midazolam, polyethylene glycol  Review of Systems:  Unable to obtain. Patient intubated and sedated.    Physical Examination: BP (!) 172/93    Pulse (!) 115    Temp 98.2 F (36.8 C)    Resp 16    Ht 5' 2.01" (1.575 m)    Wt 54.4 kg    SpO2 99%    BMI 21.93 kg/m  Gen: Alert, drowsy. No accessory muscle use.  HEENT: PEERLA, EOMI, Neck: supple, no JVD or thyromegaly Chest: CTA bilaterally, no wheezes, crackles, or other adventitious sounds CV: RRR, no m/g/c/r Abd: soft, NT, ND, +BS in all four quadrants; no HSM, guarding, ridigity, or rebound tenderness Ext: no edema, well perfused with 2+ pulses, Skin: no rash or lesions noted Lymph: no LAD  Data: Lab Results  Component Value Date   WBC 10.5 01/30/2021   HGB 9.3 (L) 01/30/2021   HCT 27.6 (L) 01/30/2021   MCV 85.4 01/30/2021   PLT 253 01/30/2021   Recent Labs  Lab 01/29/21 1501 01/29/21 2159 01/30/21 0321  HGB 8.9* 8.5* 9.3*   Lab Results  Component Value Date   NA 136 01/30/2021   K 2.8 (L) 01/30/2021   CL 103 01/30/2021   CO2 25 01/30/2021   BUN 8 01/30/2021   CREATININE 0.58 01/30/2021   Lab  Results  Component Value Date   ALT 13 01/26/2021   AST 23 01/26/2021   ALKPHOS 46 01/26/2021   BILITOT 0.5 01/26/2021   Recent Labs  Lab 01/26/21 0447  INR 1.8*   Assessment/Plan:  66 y/o Caucasian female with a Forrest Classification II peptic ulcer s/p epinephrine and bipolar cautery of posterior bulb visible vessel with Dr. Mia Creek 12/29. No recurrent bleeding or hemodynamic instability.   Recommendations:  - Transition to oral Protonix and continue this upon discharge  - H&H stable with no signs of recurrent bleeding - Continue to monitor serial H&H. Transfuse for Hgb <7.0.  - If patient develops recurrent hemodynamically significant GI bleeding, she will need vascular consult for possible empiric GDA embolization - Advance diet as tolerated  - Check H pylori stool antigen or serology and treat if positive  - Continue supportive care per primary team. Anticipate moving to medical floor later today. - GI following along with you. Dr. Mia Creek will be back tomorrow.   Please call with questions or concerns.   Jacob Moores, PA-C [facility] [phone] (Cell)

## 2021-01-30 NOTE — TOC Progression Note (Signed)
Transition of Care [facility]) - Progression Note    Patient Details  Name: Shritha Heideman MRN: [id number] Date of Birth: Oct 02, 1955  Transition of Care [facility]) CM/SW Contact  Shelbie Hutching, RN Phone Number: 01/30/2021, 12:07 PM  Clinical Narrative:    Patient awake and alert, sitting up in bed, husband at the bedside.  PT has recommended home health at discharge and patient and husband agree to [facility] services.  They do not have an agency preference.  [place] with [facility] has accepted [facility] referral for [facility] PT.     Expected Discharge Plan: [place] Barriers to Discharge: Continued Medical Work up  Expected Discharge Plan and Services Expected Discharge Plan: [place]   Discharge Planning Services: CM Consult Post Acute Care Choice: [place] arrangements for the past 2 months: Single Family Home                 DME Arranged: N/A DME Agency: NA       HH Arranged: PT HH Agency: [place] Date [place]: 01/30/21 Time [place]: 1206 Representative spoke with at [place]: [place]   Social Determinants of Health ([place]) Interventions    Readmission Risk Interventions No flowsheet data found.

## 2021-01-30 NOTE — Consult Note (Incomplete)
PHARMACY CONSULT NOTE - FOLLOW UP  Pharmacy Consult for Electrolyte Monitoring and Replacement   Recent Labs: Potassium (mmol/L)  Date Value  01/30/2021 2.8 (L)  08/12/2011 4.4   Magnesium (mg/dL)  Date Value  95/62/1308 2.0  08/11/2011 2.2   Calcium (mg/dL)  Date Value  65/78/4696 8.5 (L)   Calcium, Total (mg/dL)  Date Value  29/52/8413 8.5   Albumin (g/dL)  Date Value  24/40/1027 2.2 (L)  01/01/2018 4.6  08/10/2011 4.2   Phosphorus (mg/dL)  Date Value  25/36/6440 2.9  08/10/2011 1.6 (L)   Sodium (mmol/L)  Date Value  01/30/2021 136  01/01/2018 136  08/12/2011 138     Assessment: 65yo female with PMH of Anxiety, Asthma, Depression, Hyperlipidemia, Hypertension, and IBS who was admitted to the hospital for syncope. Patient found to have hemoglobin 3.8 on admission, s/p transfusion and fluid resuscitation. EGD 12/29 with treatment of duodenal ulcer. Patient remains intubated in the ICU at this time. Pharmacy was consulted for electrolyte monitoring and replacement.   Free water 30 ml q4h TF @ 45 ml/hr    Goal of Therapy:  Electrolytes WNL  Plan:  --No replacement currently needed --Continue to monitor and replace electrolytes as clinically indicated   Doloris Hall, PharmD Clinical Pharmacist 01/30/2021 7:27 AM

## 2021-01-30 NOTE — Evaluation (Signed)
Physical Therapy Evaluation Patient Details Name: Ryleeann Urquiza MRN: [id number] DOB: 06/12/1955 Today's Date: 01/30/2021  History of Present Illness  Pt is 65yo female that presented to the ED for multiple syncopal episodes, pulled out of car puseless with no respiratory effort, underwent 1 round of CPR, intubated. Patient found to have hemoglobin 3.8 on admission, s/p transfusion and fluid resuscitation. EGD 12/29 with treatment of duodenal ulcer.  Extubated 01/29/2021.  PMH of Anxiety, Asthma, Depression, Hyperlipidemia, Hypertension, and IBS.   Clinical Impression  Patient alert, agreeable to PT, oriented to self, situation, place. Per family and pt, at baseline pt is independent. Lives with her husband, 2 syncopal episodes prior to admission, no other falls.   The patient was able to lift all extremities against gravity. Supine to sit with HOB elevated, CGA. Good sitting balance noted. She was able to step pivot to recliner with handheld assist, but RW utilized for remainder of mobility to improve pt confidence and safety. Pt needed several cues to reinforce education on RW use/safe hand placement, poor carryover noted. She did ambulate ~2ft with RW and CGA, very slow, cautious. Decreased step height/length bilaterally, no LOB. HR 120s, spO2 90% or greater. Up in chair with CNA at bedside for breakfast set up.  Overall the patient demonstrated deficits (see "PT Problem List") that impede the patient's functional abilities, safety, and mobility and would benefit from skilled PT intervention. Recommendation is HHPT with supervision for all mobility/OOB activities at this time, pt/family agreeable.      Recommendations for follow up therapy are one component of a multi-disciplinary discharge planning process, led by the attending physician.  Recommendations may be updated based on patient status, additional functional criteria and insurance authorization.  Follow Up Recommendations Home health  PT    Assistance Recommended at Discharge Other (comment) (supervision for all mobility/OOB activities initially at discharge, family agreeable to plan)  Functional Status Assessment    Equipment Recommendations  Rolling walker (2 wheels)    Recommendations for Other Services OT consult     Precautions / Restrictions Precautions Precautions: Fall Restrictions Weight Bearing Restrictions: No      Mobility  Bed Mobility Overal bed mobility: Needs Assistance Bed Mobility: Supine to Sit     Supine to sit: HOB elevated;Min guard          Transfers Overall transfer level: Needs assistance Equipment used: 1 person hand held assist;Rolling walker (2 wheels) Transfers: Sit to/from Stand;Bed to chair/wheelchair/BSC Sit to Stand: Min guard   Step pivot transfers: Min assist            Ambulation/Gait Ambulation/Gait assistance: Min guard Gait Distance (Feet): 50 Feet Assistive device: Rolling walker (2 wheels)         General Gait Details: very slow, cautious. decreased step height/length bilaterally no LOB. encouragement to ambulate a bit further  Stairs            Wheelchair Mobility    Modified Rankin (Stroke Patients Only)       Balance Overall balance assessment: Needs assistance Sitting-balance support: Feet supported Sitting balance-Leahy Scale: Good Sitting balance - Comments: able to sit and reach for sock to adjust it     Standing balance-Leahy Scale: Fair Standing balance comment: able to stand without RW, but improved confidence and safety noted with BUE support                             Pertinent Vitals/Pain Pain  Assessment: No/denies pain    Home Living Family/patient expects to be discharged to:: Private residence Living Arrangements: Spouse/significant other Available Help at Discharge: Available 24 hours/day;Family Type of Home: House Home Access: Level entry       Home Layout: One level Home Equipment:  None Additional Comments: adjustable bed    Prior Function Prior Level of Function : Independent/Modified Independent                     Hand Dominance        Extremity/Trunk Assessment   Upper Extremity Assessment Upper Extremity Assessment: Defer to OT evaluation    Lower Extremity Assessment Lower Extremity Assessment: Generalized weakness       Communication   Communication: No difficulties  Cognition Arousal/Alertness: Awake/alert Behavior During Therapy: WFL for tasks assessed/performed Overall Cognitive Status: Impaired/Different from baseline Area of Impairment: Memory;Problem solving                     Memory: Decreased short-term memory       Problem Solving: Slow processing;Requires verbal cues;Requires tactile cues          General Comments      Exercises     Assessment/Plan    PT Assessment Patient needs continued PT services  PT Problem List Decreased strength;Decreased mobility;Decreased range of motion;Decreased activity tolerance;Decreased balance;Decreased knowledge of use of DME       PT Treatment Interventions DME instruction;Therapeutic exercise;Gait training;Balance training;Stair training;Neuromuscular re-education;Functional mobility training;Therapeutic activities;Patient/family education    PT Goals (Current goals can be found in the Care Plan section)  Acute Rehab PT Goals Patient Stated Goal: to get her strength back PT Goal Formulation: With patient Time For Goal Achievement: 02/13/21 Potential to Achieve Goals: Good    Frequency Min 2X/week   Barriers to discharge        Co-evaluation               AM-PAC PT "6 Clicks" Mobility  Outcome Measure Help needed turning from your back to your side while in a flat bed without using bedrails?: None Help needed moving from lying on your back to sitting on the side of a flat bed without using bedrails?: None Help needed moving to and from a bed to a  chair (including a wheelchair)?: A Little Help needed standing up from a chair using your arms (e.g., wheelchair or bedside chair)?: A Little Help needed to walk in hospital room?: A Little Help needed climbing 3-5 steps with a railing? : A Lot 6 Click Score: 19    End of Session Equipment Utilized During Treatment: Gait belt Activity Tolerance: Patient tolerated treatment well Patient left: in chair;with call bell/phone within reach;with nursing/sitter in room;with family/visitor present Nurse Communication: Mobility status PT Visit Diagnosis: Other abnormalities of gait and mobility (R26.89);Difficulty in walking, not elsewhere classified (R26.2);Muscle weakness (generalized) (M62.81)    Time: 7262-0355 PT Time Calculation (min) (ACUTE ONLY): 33 min   Charges:   PT Evaluation $PT Eval Low Complexity: 1 Low PT Treatments $Therapeutic Exercise: 8-22 mins $Therapeutic Activity: 8-22 mins       Olga Coaster PT, DPT 9:54 AM,01/30/21

## 2021-01-30 NOTE — Progress Notes (Signed)
NAME:  Angela Espinoza, MRN:  [id number], DOB:  April 30, 1955, LOS: 4 ADMISSION DATE:  01/26/2021, INITIAL CONSULTATION DATE:  01/26/2021 REFERRING MD:  Chiquita Loth MD, CHIEF COMPLAINT:  SYNCOPE AND COLLAPSE   Brief Patient Description  66 y.o female with PMH of Anxiety, Depression, HLD, HTN, and IBS presented to the ED with multiple episodes of syncope and collapse. Per husband, patient has been drinking a new supplement drink with unintentional 20 lbs weight loss over the past 3 weeks. She had been progressively getting weaker with multiple syncopal episode and diaphoresis per husband. On the morning of 12/29 at 3 am patient had a syncopal episode and collapsed.    ER Course:She was brought to the ED  and  Pulled out of the car pulseless with no respiratory effort. ROSC after 1 round of CPR.  Intubated for airway protection. Initial work up revealed hemoglobin 3.8>3.0.  A CT abd was performed revealed a large and deep ulceration at the duodenal bulb, usually peptic, ulcerated tumor is also considered.The GDA and gastroepiploic arteries are at the ulcer base and there is suspected blood clot within the dilated stomach. Family doesn't think she was having melenic stools at home but unsure. She did vomit yesterday but due to the color of the floors, unsure if any blood was in it. She takes meloxicam daily and is not on a PPI regularly. Two units of PRBC were transfused prior to her arrival in the ICU. Two more are pending. Levophed was started. 4 units of blood ordered, 2 transfused (finishing on arrival to ICU). PCCM consulted.  01/30/21- Patient is stable for TRH and medical floor transfer. PCCM available as needed.   Pertinent  Medical History   has a past medical history of Anxiety, Asthma, Depression, Hyperlipidemia, Hypertension, and IBS (irritable bowel syndrome).  has a past surgical history that includes Oophorectomy; Exploratory laparotomy; Foot surgery; Colonoscopy; Tonsillectomy; Axillary  lymph node biopsy (Right, 08/03/2014); Kyphoplasty (N/A, 12/10/2014); Abdominal hysterectomy (age 26); Breast biopsy (Left, 10/07/2018); and Breast biopsy (Left, 10/21/2019). Significant Hospital Events: Including procedures, antibiotic start and stop dates in addition to other pertinent events   12/29: Admitted with Acute Blood Loss Anemia secondary to Acute GI Bleed  and hemorrhagic shock s/p emergent EGD 12/30 remains intubated AND sedated failed SAT/SBYTdue to delirium, MRI BRAIN WNL PER REPORT 12/31 remains intubated, awaiting for family 1/23: Extubated to [facility]  Cultures:  12/29: SARS-CoV-2 PCR>> negative 12/29: Influenza PCR>> negative 12/29: Blood culture x2>>No growth thus far 12/29:Urine>>no growth 12/29: MRSA PCR>> negative   Antimicrobials:  12/29 cefepime x 1 12/29 ceftriaxone x 1 12/29 metronidazole x 1 12/29 vancomycin x 1 01/01 cefepime >>  Interim History / Subjective:  -Patient extubated to [place] -Off sedation -Alert and oriented x3 -Hemoglobin stable at 9.1 this am  OBJECTIVE   Blood pressure (!) 159/95, pulse 100, temperature 98 F (36.7 C), resp. rate 16, height 5' 2.01" (1.575 m), weight 54.4 kg, SpO2 97 %.        Intake/Output Summary (Last 24 hours) at 01/30/2021 0901 Last data filed at 01/30/2021 0534 Gross per 24 hour  Intake 598.52 ml  Output 1850 ml  Net -1251.48 ml    Filed Weights   01/28/21 0336 01/29/21 0354 01/30/21 0600  Weight: 57.1 kg 59 kg 54.4 kg    Examination: GENERAL:26 age-year-old patient lying in the bed with no acute distress.  EYES: Pupils equal, round, reactive to light and accommodation. No scleral icterus. Extraocular muscles intact.  HEENT: Head  atraumatic, normocephalic. Oropharynx and nasopharynx clear.  NECK:  Supple, no jugular venous distention. No thyroid enlargement, no tenderness.  LUNGS: Normal breath sounds bilaterally, no wheezing, rales,rhonchi or crepitation. No use of accessory muscles of respiration.   CARDIOVASCULAR: S1, S2 normal. No murmurs, rubs, or gallops.  ABDOMEN: Soft, nontender, nondistended. Bowel sounds present. No organomegaly or mass.  EXTREMITIES: No pedal edema, cyanosis, or clubbing.  NEUROLOGIC: Cranial nerves II through XII are intact. Muscle strength 5/5 in all extremities. Sensation intact. Gait not checked.  PSYCHIATRIC: The patient is alert and oriented x 3.  SKIN: No obvious rash, lesion, or ulcer.   Labs/imaging that I havepersonally reviewed  (right click and "Reselect all SmartList Selections" daily)    Labs   CBC: Recent Labs  Lab 01/26/21 0403 01/26/21 0447 01/27/21 0349 01/27/21 1029 01/28/21 0337 01/28/21 0901 01/29/21 0530 01/29/21 0931 01/29/21 1501 01/29/21 2159 01/30/21 0321  WBC 18.6*  --  11.3*  --  9.9  --  11.6*  --   --   --  10.5  NEUTROABS 10.4*  --  8.4*  --   --   --   --   --   --   --   --   HGB 3.8*   < > 8.7*   < > 8.5*   < > 9.1* 8.8* 8.9* 8.5* 9.3*  HCT 13.7*   < > 24.6*   < > 25.2*   < > 26.9* 26.3* 27.4* 25.7* 27.6*  MCV 102.2*  --  80.4  --  85.4  --  84.9  --   --   --  85.4  PLT 491*  --  210  --  167  --  188  --   --   --  253   < > = values in this interval not displayed.     Basic Metabolic Panel: Recent Labs  Lab 01/26/21 0403 01/26/21 0720 01/27/21 0349 01/27/21 2154 01/28/21 0337 01/29/21 0359 01/29/21 1700 01/30/21 0321  NA 138  --  137  --  135 137  --  136  K 4.2  --  2.8* 3.8 3.7 3.7  --  2.8*  CL 106  --  104  --  105 108  --  103  CO2 10*  --  29  --  27 24  --  25  GLUCOSE 484*  --  103*  --  98 104*  --  99  BUN 31*  --  24*  --  19 16  --  8  CREATININE 1.23*  --  0.92  --  0.67 0.60  --  0.58  CALCIUM 8.8*  --  7.5*  --  8.1* 8.1*  --  8.5*  MG  --  2.1 2.0  --  2.0  --  1.8 2.0  PHOS  --  4.2 2.8  --  3.4  --  3.5 2.9    GFR: Estimated Creatinine Clearance: 54.7 mL/min (by C-G formula based on SCr of 0.58 mg/dL). Recent Labs  Lab 01/26/21 0403 01/26/21 0720 01/26/21 1407  01/26/21 2147 01/27/21 0349 01/28/21 0337 01/29/21 0359 01/29/21 0530 01/30/21 0321  PROCALCITON  --  <0.10  --   --   --   --  <0.10  --  <0.10  WBC 18.6*  --   --   --  11.3* 9.9  --  11.6* 10.5  LATICACIDVEN >9.0* 4.9* 1.4 0.9  --   --   --   --   --  Liver Function Tests: Recent Labs  Lab 01/26/21 0403  AST 23  ALT 13  ALKPHOS 46  BILITOT 0.5  PROT 4.6*  ALBUMIN 2.2*    Recent Labs  Lab 01/26/21 0403  LIPASE 27    No results for input(s): AMMONIA in the last 168 hours.  ABG    Component Value Date/Time   PHART 7.51 (H) 01/26/2021 1317   PCO2ART 40 01/26/2021 1317   PO2ART 141 (H) 01/26/2021 1317   HCO3 31.9 (H) 01/26/2021 1317   ACIDBASEDEF 24.5 (H) 01/26/2021 0415   O2SAT 99.4 01/26/2021 1317      Coagulation Profile: Recent Labs  Lab 01/26/21 0447  INR 1.8*     Cardiac Enzymes: No results for input(s): CKTOTAL, CKMB, CKMBINDEX, TROPONINI in the last 168 hours.  HbA1C: No results found for: HGBA1C  CBG: Recent Labs  Lab 01/29/21 1858 01/29/21 1949 01/29/21 2346 01/30/21 0401 01/30/21 0734  GLUCAP 101* 107* 97 88 101*     Allergies Allergies  Allergen Reactions   Indapamide Other (See Comments)    tingling all over body      Home Medications  Prior to Admission medications   Medication Sig Start Date End Date Taking? Authorizing Provider  acetaminophen (TYLENOL) 500 MG tablet Take 500 mg by mouth every 6 (six) hours as needed.   Yes [provider]  Biotin 1000 MCG CHEW Chew 1,000 mcg by mouth daily.    Yes [provider]  BLACK ELDERBERRY PO Take 1 capsule by mouth daily.    Yes [provider]  Black Pepper-Turmeric 3-500 MG CAPS Take by mouth.   Yes [provider]  Calcium Carbonate-Vitamin D (CALCIUM 500 + D PO) Take 1 tablet by mouth daily. 08/18/18  Yes [provider]  DULoxetine (CYMBALTA) 30 MG capsule Take 30 mg by mouth 2 (two) times daily.   Yes [provider]  gabapentin (NEURONTIN) 300 MG capsule Take 300 mg by mouth 3 (three) times daily.   Yes [provider]  losartan (COZAAR) 25 MG tablet Take 25 mg by mouth every morning.   Yes [provider]  meloxicam (MOBIC) 15 MG tablet Take 15 mg by mouth daily. 03/20/20  Yes [provider]  Multiple Vitamin (MULTIVITAMIN) capsule Take 1 capsule by mouth daily.   Yes [provider]  Omega-3 Fatty Acids (FISH OIL PO) Take 1 tablet by mouth daily.   Yes [provider]  SENNA PLUS 8.6-50 MG tablet Take 1 tablet by mouth daily. 01/19/21  Yes [provider]  vitamin C (ASCORBIC ACID) 500 MG tablet Take 1,000 mg by mouth daily.    Yes [provider]  Scheduled Meds:  Chlorhexidine Gluconate Cloth  6 each Topical Q0600   docusate sodium  100 mg Oral BID   insulin aspart  0-9 Units Subcutaneous Q4H   metoprolol tartrate  12.5 mg Oral BID   mupirocin ointment  1 application Nasal BID   pantoprazole (PROTONIX) IV  40 mg Intravenous Q12H   polyethylene glycol  17 g Oral Daily   potassium chloride  40 mEq Oral Q4H   Continuous Infusions:  sodium chloride Stopped (01/26/21 0741)   ceFEPime (MAXIPIME) IV 2 g (01/29/21 2037)   PRN Meds:.docusate sodium, fentaNYL (SUBLIMAZE) injection, labetalol, melatonin, midazolam, polyethylene glycol  ASSESSMENT & PLAN  Acute Hypoxic Respiratory Failure secondary to Acute blood loss Anemia with hemorrhagic shock S/p extubation -Supplemental O2 as needed to maintain O2 saturations 88 to 92% -Follow intermittent  ABG and chest x-ray as needed  Acute Blood Loss Anemia secondary to Acute GI Bleed of the Duodenal Ulcer Hemorrhagic Shock S/p Endoscopy with epinephrine and bipolar cautery of posterior bulb visible vessel -IVF resuscitation and Pressors to maintain MAP>65 -H&H monitoring q6h -Transfuse PRN Hgb<7 -NPO for now plan transition to clear liquid as tolerated pending Speech -If patient  has hemodynamically significant bleeding will consider IR Consult for embolization) -Hold NSAIDs, steroids, ASA -Helicobacter pylori Ab + stool Ag.  If either positive, treat -Post-EGD Stabilization Complete PPI drip: Protonix 40 mg BID -GI Consult input appreciated  Acute Metabolic Encephalopathy in the setting of above~IMPROVED -Provide supportive care -MRI Brain negative for acute intracranial abnormality -Avoid sedatives as able -Neurology input appreciated  Hypertension -BP borderline low in the setting of acute blood loss anemia -Hold BP meds for now  Anxiety and Depression -Will resume home Cymbalta once able to tolerate po   Best practice (right click and "Reselect all SmartList Selections" daily)  Diet:  NPO Pain/Anxiety/Delirium protocol (if indicated): No VAP protocol (if indicated): Not indicated DVT prophylaxis: Contraindicated GI prophylaxis: PPI Glucose control:  SSI No Central venous access:  Yes, and it is still needed Arterial line:  N/A Foley:  Yes, and it is still needed Mobility:  bed rest  PT consulted: N/A Last date of multidisciplinary goals of care discussion [01/29/2021] Code Status:  full code Disposition: ICU   Vida RiggerFuad Idara Woodside, M.D.  Pulmonary & Critical Care Medicine  [facility]

## 2021-01-30 NOTE — Evaluation (Signed)
Occupational Therapy Evaluation Patient Details Name: Angela Espinoza MRN: [id number] DOB: Jan 27, 1956 Today's Date: 01/30/2021   History of Present Illness Pt is 66yo female that presented to the ED for multiple syncopal episodes, pulled out of car puseless with no respiratory effort, underwent 1 round of CPR, intubated. Patient found to have hemoglobin 3.8 on admission, s/p transfusion and fluid resuscitation. EGD 12/29 with treatment of duodenal ulcer.  Extubated 01/29/2021.  PMH of Anxiety, Asthma, Depression, Hyperlipidemia, Hypertension, and IBS.   Clinical Impression   Angela Espinoza was seen for OT evaluation this date. Prior to hospital admission, pt was Independent for mobility and ADLs. Pt lives with spouse, available 24/7. Pt presents to acute OT demonstrating impaired ADL performance and functional mobility 2/2 decreased activity tolerance and impaired functional use of dominant RUE. Pt currently requires SETUP + SUPERVISION don B socks, requires multiple attempts 2/2 dropping socks (decreased sensation in dominant RUE). CGA + RW for ADL t/f and functional reaching task in standing reaching inside BOS. MIN A self-drinking using RUE only in standing (RUE weakness vs decreased proprioception). Green theraputty and handout provided. Pt would benefit from skilled OT to address noted impairments and functional limitations (see below for any additional details) in order to maximize safety and independence while minimizing falls risk and caregiver burden. Upon hospital discharge, recommend HHOT to maximize pt safety and return to PLOF.       Recommendations for follow up therapy are one component of a multi-disciplinary discharge planning process, led by the attending physician.  Recommendations may be updated based on patient status, additional functional criteria and insurance authorization.   Follow Up Recommendations  Home health OT    Assistance Recommended at Discharge Intermittent  Supervision/Assistance  Functional Status Assessment  Patient has had a recent decline in their functional status and demonstrates the ability to make significant improvements in function in a reasonable and predictable amount of time.  Equipment Recommendations  BSC/3in1    Recommendations for Other Services       Precautions / Restrictions Precautions Precautions: Fall Restrictions Weight Bearing Restrictions: No      Mobility Bed Mobility Overal bed mobility: Needs Assistance Bed Mobility: Supine to Sit;Sit to Supine     Supine to sit: Supervision Sit to supine: Supervision        Transfers Overall transfer level: Needs assistance Equipment used: Rolling walker (2 wheels) Transfers: Sit to/from Stand Sit to Stand: Min guard     Step pivot transfers: Min assist     General transfer comment: cues for RW technique      Balance Overall balance assessment: Needs assistance Sitting-balance support: Feet supported Sitting balance-Leahy Scale: Good Sitting balance - Comments: able to sit and reach for sock to adjust it   Standing balance support: No upper extremity supported;During functional activity Standing balance-Leahy Scale: Fair Standing balance comment: able to stand without RW, but improved confidence and safety noted with BUE support                           ADL either performed or assessed with clinical judgement   ADL Overall ADL's : Needs assistance/impaired                                       General ADL Comments: SETUP + SUPERVISION don B socks, requires multiple attempts 2/2 dropping socks (decreased sensation  in dominant RUE). CGA + RW for ADL t/f and functional reaching task in standing reaching inside BOS. MIN A self-drinking using RUE only in standing (RUE weakness vs decreased proprioception).      Pertinent Vitals/Pain Pain Assessment: No/denies pain     Hand Dominance Right   Extremity/Trunk  Assessment Upper Extremity Assessment Upper Extremity Assessment: RUE deficits/detail RUE Deficits / Details: 3+/5 grossly. Decreased [place] and senstation - IDs touch with eyes closed to R hand, unable to distinguish fingers RUE Sensation: decreased light touch RUE Coordination: decreased fine motor   Lower Extremity Assessment Lower Extremity Assessment: Generalized weakness       Communication Communication Communication: No difficulties   Cognition Arousal/Alertness: Awake/alert Behavior During Therapy: WFL for tasks assessed/performed Overall Cognitive Status: Impaired/Different from baseline Area of Impairment: Memory;Problem solving                     Memory: Decreased short-term memory       Problem Solving: Slow processing;Requires verbal cues;Requires tactile cues       General Comments       Exercises Exercises: Other exercises Other Exercises Other Exercises: Pt and family educated re: OT role, DME recs, d/c recs, falls prevention, ECS, theraputty HEP   Shoulder Instructions      Home Living Family/patient expects to be discharged to:: Private residence Living Arrangements: Spouse/significant other Available Help at Discharge: Available 24 hours/day;Family Type of Home: House Home Access: Level entry     Home Layout: One level     Bathroom Shower/Tub: Occupational psychologist: Standard (both, uses standard)     Home Equipment: Shower seat   Additional Comments: adjustable bed      Prior Functioning/Environment Prior Level of Function : Independent/Modified Independent;Driving                        OT Problem List: Decreased strength;Decreased activity tolerance;Impaired balance (sitting and/or standing);Decreased safety awareness;Impaired UE functional use      OT Treatment/Interventions: Self-care/ADL training;Energy conservation;DME and/or AE instruction;Therapeutic activities;Neuromuscular education;Therapeutic  exercise;Patient/family education;Balance training    OT Goals(Current goals can be found in the care plan section) Acute Rehab OT Goals Patient Stated Goal: to go home tmrw OT Goal Formulation: With patient/family Time For Goal Achievement: 02/13/21 Potential to Achieve Goals: Good ADL Goals Pt Will Perform Lower Body Dressing: Independently;sit to/from stand Pt Will Transfer to Toilet: Independently;ambulating;regular height toilet Pt/caregiver will Perform Home Exercise Program: Increased ROM;Increased strength;Right Upper extremity;Independently;With theraputty  OT Frequency: Min 2X/week   Barriers to D/C:            Co-evaluation              AM-PAC OT "6 Clicks" Daily Activity     Outcome Measure Help from another person eating meals?: A Little Help from another person taking care of personal grooming?: A Little Help from another person toileting, which includes using toliet, bedpan, or urinal?: A Little Help from another person bathing (including washing, rinsing, drying)?: A Little Help from another person to put on and taking off regular upper body clothing?: A Little Help from another person to put on and taking off regular lower body clothing?: A Little 6 Click Score: 18   End of Session Equipment Utilized During Treatment: Rolling walker (2 wheels)  Activity Tolerance: Patient tolerated treatment well Patient left: in bed;with call bell/phone within reach;with family/visitor present  OT Visit Diagnosis: Other abnormalities of gait and  mobility (R26.89);Muscle weakness (generalized) (M62.81)                Time: [id number] OT Time Calculation (min): 20 min Charges:  OT General Charges $OT Visit: 1 Visit OT Evaluation $OT Eval Low Complexity: 1 Low OT Treatments $Self Care/Home Management : 8-22 mins  Dessie Coma, M.S. OTR/L  01/30/21, 12:00 PM  ascom [phone]

## 2021-01-31 LAB — CBC
HCT: 30.6 % — ABNORMAL LOW (ref 36.0–46.0)
Hemoglobin: 10.1 g/dL — ABNORMAL LOW (ref 12.0–15.0)
MCH: 28.3 pg (ref 26.0–34.0)
MCHC: 33 g/dL (ref 30.0–36.0)
MCV: 85.7 fL (ref 80.0–100.0)
Platelets: 317 K/uL (ref 150–400)
RBC: 3.57 MIL/uL — ABNORMAL LOW (ref 3.87–5.11)
RDW: 15 % (ref 11.5–15.5)
WBC: 9.9 K/uL (ref 4.0–10.5)
nRBC: 0 % (ref 0.0–0.2)

## 2021-01-31 LAB — CULTURE, BLOOD (ROUTINE X 2)
Culture: NO GROWTH
Culture: NO GROWTH

## 2021-01-31 LAB — GLUCOSE, CAPILLARY
Glucose-Capillary: 94 mg/dL (ref 70–99)
Glucose-Capillary: 117 mg/dL — ABNORMAL HIGH (ref 70–99)

## 2021-01-31 LAB — BASIC METABOLIC PANEL
Anion gap: 6 (ref 5–15)
BUN: 9 mg/dL (ref 8–23)
CO2: 27 mmol/L (ref 22–32)
Calcium: 8.8 mg/dL — ABNORMAL LOW (ref 8.9–10.3)
Chloride: 104 mmol/L (ref 98–111)
Creatinine, Ser: 0.6 mg/dL (ref 0.44–1.00)
GFR, Estimated: >60 mL/min (ref >60)
Glucose, Bld: 105 mg/dL — ABNORMAL HIGH (ref 70–99)
Potassium: 4.4 mmol/L (ref 3.5–5.1)
Sodium: 137 mmol/L (ref 135–145)

## 2021-01-31 LAB — PROCALCITONIN: Procalcitonin: <0.1 ng/mL

## 2021-01-31 LAB — HEMOGLOBIN A1C
Hgb A1c MFr Bld: 5.2 % (ref 4.8–5.6)
Mean Plasma Glucose: 102.54 mg/dL

## 2021-01-31 MED ORDER — PANTOPRAZOLE SODIUM 40 MG PO TBEC: 40.0000 mg | DELAYED_RELEASE_TABLET | Freq: Two times a day (BID) | ORAL | 1 refills | Status: DC

## 2021-01-31 MED ORDER — METOPROLOL TARTRATE 25 MG PO TABS: 12.5000 mg | ORAL_TABLET | Freq: Two times a day (BID) | ORAL | 1 refills | Status: DC

## 2021-01-31 NOTE — Progress Notes (Signed)
Patient discharged to home with home health with all belongings and no complications. Patient and husband received discharge orders and had no questions. Medications were sent to [facility] per patient request. VSS. PIVX1 removed with catheter intact. No questions or complications at this time.

## 2021-01-31 NOTE — Progress Notes (Signed)
Speech Language Pathology Treatment: Dysphagia  Patient Details Name: Angela Espinoza MRN: [id number] DOB: 11-09-1955 Today's Date: 01/31/2021 Time: 2878-6767 SLP Time Calculation (min) (ACUTE ONLY): 10 min  Assessment / Plan / Recommendation Clinical Impression  Pt seen for diet toleration per ST POC. [name] husband was providing pt with one pill at a time while pt was consuming thin liquids via straw. Pt had great difficulty attending to medicine in her hand and talking with MD and husband. Recommend general aspiration precautions, including no talking, when consuming POs. At this time, it appears that pt is able to consume medicine whole with thin liquids via straw.     HPI HPI: Per 26 H&P "66 yo WF presented via private vehicle to the ED after having several syncopal episodes in as many days. Last night she developed diaphoresis and increased tone/stiffness, which prompted her husband to bring her to the ED. She again had syncope on arrival to the ED, and indeed was thought to be in PEA while still in the car. CPR was initiated, but within 15 seconds the patient was moving. She did require intubation. She was notably pale and indeed her presenting hemoglobin was 3.8 with a follow-up of 3.0 about 42mn later. There was not overt melena or hematochezia to account for this. A CT abd was performed revealed a large and deep ulceration at the duodenal bulb, usually peptic, ulcerated tumor is also considered.The GDA and gastroepiploic arteries are at the ulcer base and there is suspected blood clot within the dilated stomach. Her history is notable for Mobic. Two units of PRBC were transfused prior to her arrival in the ICU. Two more are pending. Levophed was started." Most recent CXR, 01/29/21 "Unremarkable hardware.     Mild atelectasis or infiltrate on the left."      SLP Plan  All goals met      Recommendations for follow up therapy are one component of a multi-disciplinary discharge  planning process, led by the attending physician.  Recommendations may be updated based on patient status, additional functional criteria and insurance authorization.    Recommendations  Diet recommendations: Regular;Thin liquid Liquids provided via: Straw Medication Administration: Whole meds with liquid Supervision: Patient able to self feed Compensations: Minimize environmental distractions;Slow rate;Small sips/bites Postural Changes and/or Swallow Maneuvers: Seated upright 90 degrees;Upright 30-60 min after meal                Oral Care Recommendations: Oral care BID Follow Up Recommendations: Home health SLP Assistance recommended at discharge: Frequent or constant Supervision/Assistance SLP Visit Diagnosis: Dysphagia, pharyngeal phase (R13.13) Plan: All goals met         Angela Espinoza B. ORutherford NailM.S., CCC-SLP, CClintonOffice [phone]  HStormy Fabian 01/31/2021, 12:29 PM

## 2021-01-31 NOTE — Care Management Important Message (Signed)
Important Message  Patient Details  Name: Angela Espinoza MRN: [id number] Date of Birth: Dec 24, 1955   Medicare Important Message Given:  Yes     Johnell Comings 01/31/2021, 1:35 PM

## 2021-01-31 NOTE — TOC Transition Note (Signed)
Transition of Care [facility]) - CM/SW Discharge Note   Patient Details  Name: Angela Espinoza MRN: [id number] Date of Birth: 30-Jan-1955  [facility] CM/SW Contact:  Chapman Fitch, RN Phone Number: 01/31/2021, 2:46 PM   Clinical Narrative:    Patient to discharge today [place] with [facility] notified      Barriers to Discharge: Continued Medical Work up   Patient Goals and CMS Choice Patient states their goals for this hospitalization and ongoing recovery are:: patient agrees with [facility] CMS Medicare.gov Compare Post Acute Care list provided to:: Patient Choice offered to / list presented to : Patient  Discharge Placement                       Discharge Plan and Services   Discharge Planning Services: CM Consult Post Acute Care Choice: Home Health          DME Arranged: N/A DME Agency: NA       HH Arranged: PT HH Agency: [facility] Date HH Agency Contacted: 01/30/21 Time HH Agency Contacted: 1206 Representative spoke with at [facility] Agency: [place]  Social Determinants of Health (SDOH) Interventions     Readmission Risk Interventions No flowsheet data found.

## 2021-01-31 NOTE — Care Plan (Signed)
Patient doing well. Plan for discharge today. Will need PPI PO BID for twelve weeks and then daily. Will arranged for outpatient f/u.  Merlyn Lot MD, MPH [facility]

## 2021-01-31 NOTE — Progress Notes (Signed)
Physical Therapy Treatment Patient Details Name: Angela Espinoza MRN: [id number] DOB: [birth year] Today's Date: 01/31/2021   History of Present Illness Pt is 65yo female that presented to the ED for multiple syncopal episodes, pulled out of car puseless with no respiratory effort, underwent 1 round of CPR, intubated. Patient found to have hemoglobin 3.8 on admission, s/p transfusion and fluid resuscitation. EGD 12/29 with treatment of duodenal ulcer.  Extubated 01/29/2021.  PMH of Anxiety, Asthma, Depression, Hyperlipidemia, Hypertension, and IBS.    PT Comments    Pt alert, agreeable to PT session, improvement noted in command following, processing and STM noted. She was able to perform bed mobility modI, and transfer several times with supervision. She was able to walk >380ft, RW, handheld assist and no AD trialed. Pt with improved safety, confidence, and independence with RW, verbalized understanding of use pending further progress. Pt returned to room with visitors, up in chair with no further questions/concerns. The patient would benefit from further skilled PT intervention to continue to progress towards goals. Recommendation remains appropriate.     Recommendations for follow up therapy are one component of a multi-disciplinary discharge planning process, led by the attending physician.  Recommendations may be updated based on patient status, additional functional criteria and insurance authorization.  Follow Up Recommendations  Home health PT     Assistance Recommended at Discharge Intermittent Supervision/Assistance  Patient can return home with the following A little help with walking and/or transfers;Assistance with cooking/housework;A little help with bathing/dressing/bathroom;Help with stairs or ramp for entrance;Assist for transportation   Equipment Recommendations  Rolling walker (2 wheels);Other (comment) (pt reporting they will be borrowing a RW)    Recommendations for  Other Services OT consult     Precautions / Restrictions Precautions Precautions: Fall Restrictions Weight Bearing Restrictions: No     Mobility  Bed Mobility Overal bed mobility: Modified Independent                  Transfers Overall transfer level: Needs assistance Equipment used: None Transfers: Sit to/from Stand Sit to Stand: Supervision                Ambulation/Gait Ambulation/Gait assistance: Min guard;Supervision   Assistive device: Rolling walker (2 wheels);None;1 person hand held assist         General Gait Details: attempted no AD, handheld assist, and RW. improved confidence and pt independence with RW use   Stairs             Wheelchair Mobility    Modified Rankin (Stroke Patients Only)       Balance                                            Cognition Arousal/Alertness: Awake/alert Behavior During Therapy: WFL for tasks assessed/performed Overall Cognitive Status: Within Functional Limits for tasks assessed                                 General Comments: improved processing time today, improved STM as well        Exercises      General Comments        Pertinent Vitals/Pain Pain Assessment: No/denies pain Breathing: normal Negative Vocalization: none Facial Expression: smiling or inexpressive Body Language: relaxed Consolability: no need to console PAINAD Score: 0  Home Living                          Prior Function            PT Goals (current goals can now be found in the care plan section) Progress towards PT goals: Progressing toward goals    Frequency    Min 2X/week      PT Plan Current plan remains appropriate;Equipment recommendations need to be updated    Co-evaluation              AM-PAC PT "6 Clicks" Mobility   Outcome Measure  Help needed turning from your back to your side while in a flat bed without using bedrails?:  None Help needed moving from lying on your back to sitting on the side of a flat bed without using bedrails?: None Help needed moving to and from a bed to a chair (including a wheelchair)?: None Help needed standing up from a chair using your arms (e.g., wheelchair or bedside chair)?: None Help needed to walk in hospital room?: None Help needed climbing 3-5 steps with a railing? : A Little 6 Click Score: 23    End of Session Equipment Utilized During Treatment: Gait belt Activity Tolerance: Patient tolerated treatment well Patient left: in chair;with family/visitor present Nurse Communication: Mobility status PT Visit Diagnosis: Other abnormalities of gait and mobility (R26.89);Difficulty in walking, not elsewhere classified (R26.2);Muscle weakness (generalized) (M62.81)     Time: 7078-6754 PT Time Calculation (min) (ACUTE ONLY): 13 min  Charges:  $Gait Training: 8-22 mins                     Olga Coaster PT, DPT 11:31 AM,01/31/21

## 2021-01-31 NOTE — Plan of Care (Signed)
Problem: Education: °Goal: Ability to identify signs and symptoms of gastrointestinal bleeding will improve °Outcome: Completed/Met °  °Problem: Bowel/Gastric: °Goal: Will show no signs and symptoms of gastrointestinal bleeding °Outcome: Completed/Met °  °Problem: Fluid Volume: °Goal: Will show no signs and symptoms of excessive bleeding °Outcome: Completed/Met °  °Problem: Clinical Measurements: °Goal: Complications related to the disease process, condition or treatment will be avoided or minimized °Outcome: Completed/Met °  °Problem: Education: °Goal: Knowledge of General Education information will improve °Description: Including pain rating scale, medication(s)/side effects and non-pharmacologic comfort measures °Outcome: Completed/Met °  °Problem: Health Behavior/Discharge Planning: °Goal: Ability to manage health-related needs will improve °Outcome: Completed/Met °  °Problem: Clinical Measurements: °Goal: Ability to maintain clinical measurements within normal limits will improve °Outcome: Completed/Met °Goal: Will remain free from infection °Outcome: Completed/Met °Goal: Diagnostic test results will improve °Outcome: Completed/Met °Goal: Respiratory complications will improve °Outcome: Completed/Met °Goal: Cardiovascular complication will be avoided °Outcome: Completed/Met °  °Problem: Activity: °Goal: Risk for activity intolerance will decrease °Outcome: Completed/Met °  °Problem: Nutrition: °Goal: Adequate nutrition will be maintained °Outcome: Completed/Met °  °Problem: Coping: °Goal: Level of anxiety will decrease °Outcome: Completed/Met °  °Problem: Elimination: °Goal: Will not experience complications related to bowel motility °Outcome: Completed/Met °Goal: Will not experience complications related to urinary retention °Outcome: Completed/Met °  °Problem: Pain Managment: °Goal: General experience of comfort will improve °Outcome: Completed/Met °  °Problem: Safety: °Goal: Ability to remain free from  injury will improve °Outcome: Completed/Met °  °Problem: Skin Integrity: °Goal: Risk for impaired skin integrity will decrease °Outcome: Completed/Met °  °Problem: Activity: °Goal: Ability to tolerate increased activity will improve °Outcome: Completed/Met °  °Problem: Respiratory: °Goal: Ability to maintain a clear airway and adequate ventilation will improve °Outcome: Completed/Met °  °Problem: Role Relationship: °Goal: Method of communication will improve °Outcome: Completed/Met °  °Problem: Acute Rehab PT Goals(only PT should resolve) °Goal: Pt Will Go Supine/Side To Sit °Outcome: Completed/Met °Goal: Patient Will Transfer Sit To/From Stand °Outcome: Completed/Met °Goal: Pt Will Ambulate °Outcome: Completed/Met °  °Problem: Acute Rehab OT Goals (only OT should resolve) °Goal: Pt. Will Perform Lower Body Dressing °Outcome: Completed/Met °Goal: Pt. Will Transfer To Toilet °Outcome: Completed/Met °Goal: Pt/Caregiver Will Perform Home Exercise Program °Outcome: Completed/Met °  °

## 2021-01-31 NOTE — Evaluation (Signed)
Speech Language Pathology Evaluation Patient Details Name: Angela Espinoza MRN: [id number] DOB: 11-26-1955 Today's Date: 01/31/2021 Time: 3716-9678 SLP Time Calculation (min) (ACUTE ONLY): 15 min  Problem List:  Patient Active Problem List   Diagnosis Date Noted   Syncope and collapse 01/26/2021   Acute GI bleeding 01/26/2021   Other intervertebral disc degeneration, thoracic region 12/27/2020   Other intervertebral disc degeneration, lumbar region 12/27/2020   Abnormal mammogram of left breast 09/24/2019   Acid reflux 12/03/2018   Neurogenic pain 11/03/2018   Thoracic facet syndrome (Left) 09/30/2018   Spondylosis without myelopathy or radiculopathy, lumbosacral region 09/30/2018   Spondylosis without myelopathy or radiculopathy, thoracic region 09/30/2018   DDD (degenerative disc disease), thoracolumbar 09/30/2018   DDD (degenerative disc disease), lumbosacral 07/24/2018   History of L1 kyphoplasty 01/01/2018   Traumatic compression fracture of L1 lumbar vertebra, sequela (2016) 01/01/2018   Spondylosis without myelopathy or radiculopathy, lumbar region 01/01/2018   Lumbar facet syndrome (Left) 01/01/2018   Allergy 12/10/2017   Anxiety 12/10/2017   Asthma without status asthmaticus 12/10/2017   Depression 12/10/2017   Hypertension 12/10/2017   Chronic low back pain (1ry area of Pain) (Left) w/o sciatica 12/10/2017   Chronic pain syndrome 12/10/2017   Pharmacologic therapy 12/10/2017   Disorder of skeletal system 12/10/2017   Problems influencing health status 12/10/2017   Postmenopause 06/02/2014   Past Medical History:  Past Medical History:  Diagnosis Date   Anxiety    Asthma    NO INHALER-SMOKE AND PERFUME TRIGGERS ASTHMA   Depression    Hyperlipidemia    Hypertension    IBS (irritable bowel syndrome)    Past Surgical History:  Past Surgical History:  Procedure Laterality Date   ABDOMINAL HYSTERECTOMY  age 40   AXILLARY LYMPH NODE BIOPSY Right 08/03/2014    Procedure: EXCISION RIGHT  AXILLARY LIPOMATOSIS;  Surgeon: Nadeen Landau, MD;  Location: [facility] ORS;  Service: General;  Laterality: Right;   BREAST BIOPSY Left 10/07/2018   Stereo, Coil Clip, BENIGN BREAST TISSUE WITH AREAS CONTAINING A PREDOMINANCE OF ADIPOSE TISSUE   BREAST BIOPSY Left 10/21/2019   stereo, ribbon clip, path pending    COLONOSCOPY     ESOPHAGOGASTRODUODENOSCOPY (EGD) WITH PROPOFOL N/A 01/26/2021   Procedure: ESOPHAGOGASTRODUODENOSCOPY (EGD) WITH PROPOFOL;  Surgeon: Regis Bill, MD;  Location: [facility];  Service: Endoscopy;  Laterality: N/A;   EXPLORATORY LAPAROTOMY     FOOT SURGERY     KYPHOPLASTY N/A 12/10/2014   Procedure: KYPHOPLASTY L 1;  Surgeon: Kennedy Bucker, MD;  Location: [facility] OR[facility];  Service: Orthopedics;  Laterality: N/A;   OOPHORECTOMY     TONSILLECTOMY     HPI:  Per Physician's H&P "66 yo WF presented via private vehicle to the ED after having several syncopal episodes in as many days. Last night she developed diaphoresis and increased tone/stiffness, which prompted her husband to bring her to the ED. She again had syncope on arrival to the ED, and indeed was thought to be in PEA while still in the car. CPR was initiated, but within 15 seconds the patient was moving. She did require intubation. She was notably pale and indeed her presenting hemoglobin was 3.8 with a follow-up of 3.0 about later. There was not overt melena or hematochezia to account for this. A CT abd was performed revealed a large and deep ulceration at the duodenal bulb, usually peptic, ulcerated tumor is also considered.The GDA and gastroepiploic arteries are at the ulcer base and there is suspected blood  clot within the dilated stomach. Her history is notable for Mobic. Two units of PRBC were transfused prior to her arrival in the ICU. Two more are pending. Levophed was started." Most recent CXR, 01/29/21 "Unremarkable hardware.     Mild atelectasis or infiltrate on the left."    Assessment / Plan / Recommendation Clinical Impression  During conversation with MD, pt was noticed to be repetitive with deficits noted in attention, memory, processing and awareness. Cognitive Linguistic Evaluation ordered requested to aid in discharge planning. Pt presents with mild impairments in memory (short-term memory, delayed recall, prospective memory), selective attention, intellectual awareness, delayed processing as well as self-monitoring and self-correcting. These deficits were evident in moderate cues for redirection to tasks, difficulty with story re-telling as well as answering questions after listening to passage, more than a reasonable amount of time to recall number and names of her grandchildren, perseverative comments with no awareness. In addition, she made several verbal errors such as stating, "I lost my job in 2016" with husband correcting her "you are retired, you didn't lose your job."  In addition to this evaluation, education was provided to pt's husband on providing 24 hour supervision as well as supervision with money and medication management at discharge. Recommend post-acute HH ST to target the above mentioned deficits within functional setting.    SLP Assessment  SLP Recommendation/Assessment: All further Speech Lanaguage Pathology  needs can be addressed in the next venue of care SLP Visit Diagnosis: Cognitive communication deficit (R41.841)    Recommendations for follow up therapy are one component of a multi-disciplinary discharge planning process, led by the attending physician.  Recommendations may be updated based on patient status, additional functional criteria and insurance authorization.    Follow Up Recommendations  Home health SLP    Assistance Recommended at Discharge  Frequent or constant Supervision/Assistance  Functional Status Assessment Patient has had a recent decline in their functional status and demonstrates the ability to make significant  improvements in function in a reasonable and predictable amount of time.  Frequency and Duration           SLP Evaluation Cognition  Overall Cognitive Status: Impaired/Different from baseline Arousal/Alertness: Awake/alert Orientation Level: Oriented X4 Year: 2023 Month: January Day of Week: Correct Attention: Selective Selective Attention: Impaired Selective Attention Impairment: Verbal complex;Functional complex Memory: Impaired Memory Impairment: Decreased recall of new information;Decreased short term memory;Prospective memory Decreased Short Term Memory: Verbal basic;Functional basic Awareness: Impaired Awareness Impairment: Intellectual impairment Problem Solving: Impaired Executive Function: Self Monitoring;Self Correcting Self Monitoring: Impaired Self Monitoring Impairment: Verbal basic;Functional basic Self Correcting: Impaired Self Correcting Impairment: Verbal basic;Functional basic Behaviors: Perseveration Safety/Judgment: Impaired       Comprehension  Auditory Comprehension Overall Auditory Comprehension: Appears within functional limits for tasks assessed Visual Recognition/Discrimination Discrimination: Within Function Limits Reading Comprehension Reading Status: Within funtional limits    Expression Expression Primary Mode of Expression: Verbal Verbal Expression Overall Verbal Expression: Appears within functional limits for tasks assessed Pragmatics: Impairment Impairments: Abnormal affect (dysphoric) Written Expression Dominant Hand: Right Written Expression: Within Functional Limits   Oral / Motor  Oral Motor/Sensory Function Overall Oral Motor/Sensory Function: Within functional limits Motor Speech Overall Motor Speech: Appears within functional limits for tasks assessed           Angela Espinoza B. Dreama Saa M.S., CCC-SLP, [facility] Speech-Language Pathologist Rehabilitation Services Office [phone]  Reuel Derby 01/31/2021, 12:45 PM

## 2021-01-31 NOTE — Discharge Summary (Signed)
Physician Discharge Summary  Angela Espinoza AXE:[id number] DOB: 1955-12-21 DOA: 01/26/2021  PCP: Delman Cheadle, PA  Admit date: 01/26/2021 Discharge date: 01/31/2021  Admitted From: Home Disposition: Home  Recommendations for Outpatient Follow-up:  Follow up with PCP in 1-2 weeks Please obtain BMP/CBC in one week Please follow up with GI as scheduled  Home Health: PT Equipment/Devices: None  Discharge Condition: Stable CODE STATUS: Full Diet recommendation: As tolerated  Brief/Interim Summary: 66 y.o female with PMH of Anxiety, Depression, HLD, HTN, and IBS presented to the ED with multiple episodes of syncope and collapse -found to have hemoglobin of 3.8 subsequently 3.0 at intake with suspected hemorrhagic shock.  GI consulted status post endoscopy and stabilizing on PPI drip after emergent blood transfusion in the ICU.  Patient tolerated emergent EGD and hemostasis, subsequently extubated, evaluated for questionable seizure but more likely anoxic and hypotensive shock causing convulsions.  Patient otherwise stable and agreeable for discharge home, continue twice daily PPI, home health PT per recommendations.  Otherwise stable and agreeable for discharge home.   12/29: Admitted with Acute Blood Loss Anemia secondary to Acute GI Bleed  and hemorrhagic shock s/p emergent EGD 12/30 remains intubated AND sedated failed SAT/SBYTdue to delirium, MRI BRAIN WNL PER REPORT 12/31 remains intubated, awaiting for family 1/1: Extubated to [place] 1/2 -continues to progress with PT, mild hypokalemia, continue to follow labs; dispo planning   Assessment & Plan:     Acute Blood Loss Anemia secondary to Acute GI Bleed of the Duodenal Ulcer Hemorrhagic Shock S/p Endoscopy with epinephrine and bipolar cautery of posterior bulb visible vessel -PCCM and GI admitting, resolving now no longer intubated or requiring pressors -Emergent EGD with successful cautery on intake -Status  posttransfusion at intake requiring 2 unit PRBC 2 unit FFP 01/26/2021 -Follow repeat hemoglobin/follow for signs or symptoms of worsening anemia or recurrent GI bleed   Acute Hypoxic Respiratory Failure secondary hemorrhagic shock S/p extubation -Extubated successfully 01/29/2021  -Continue to wean oxygen as tolerated -currently on room air   Acute Metabolic Encephalopathy secondary to shock as above Rule out seizure-like activity vs syncopal convulsion -Provide supportive care -MRI Brain negative for acute intracranial abnormality -Avoid sedatives as able -Neurology input appreciated -symptoms more consistent with profound anemia and hemorrhagic shock.  No indication for antiepileptics at this time.   Hypertension, shock as above -Continue to hold home blood pressure medications, slowly titrate over the next 24 hours pending rebound hypertension now that shock is resolving   Anxiety and Depression -Resume twice daily Cymbalta at previous home dose  Discharge Instructions  Discharge Instructions     Face-to-face encounter (required for Medicare/Medicaid patients)   Complete by: As directed    I Azucena Fallen certify that this patient is under my care and that I, or a nurse practitioner or physician's assistant working with me, had a face-to-face encounter that meets the physician face-to-face encounter requirements with this patient on 01/31/2021. The encounter with the patient was in whole, or in part for the following medical condition(s) which is the primary reason for home health care (List medical condition): Ambulatory dysfunction   The encounter with the patient was in whole, or in part, for the following medical condition, which is the primary reason for home health care: Ambulatory dysfunction   I certify that, based on my findings, the following services are medically necessary home health services: Physical therapy   Reason for Medically Necessary Home Health Services:   Skilled Nursing- Change/Decline in Patient Status  Therapy- Personnel officer, Public librarian Therapy- Instruction on Safe use of Assistive Devices for ADLs     My clinical findings support the need for the above services: Unable to leave home safely without assistance and/or assistive device   Further, I certify that my clinical findings support that this patient is homebound due to: Unable to leave home safely without assistance   Home Health   Complete by: As directed    To provide the following care/treatments: PT      Allergies as of 01/31/2021       Reactions   Indapamide Other (See Comments)   tingling all over body        Medication List     STOP taking these medications    acetaminophen 500 MG tablet Commonly known as: TYLENOL   losartan 25 MG tablet Commonly known as: COZAAR   meloxicam 15 MG tablet Commonly known as: MOBIC       TAKE these medications    Biotin 1000 MCG Chew Chew 1,000 mcg by mouth daily.   BLACK ELDERBERRY PO Take 1 capsule by mouth daily.   Black Pepper-Turmeric 3-500 MG Caps Take by mouth.   CALCIUM 500 + D PO Take 1 tablet by mouth daily.   DULoxetine 30 MG capsule Commonly known as: CYMBALTA Take 30 mg by mouth 2 (two) times daily.   FISH OIL PO Take 1 tablet by mouth daily.   gabapentin 300 MG capsule Commonly known as: NEURONTIN Take 300 mg by mouth 3 (three) times daily.   metoprolol tartrate 25 MG tablet Commonly known as: LOPRESSOR Take 0.5 tablets (12.5 mg total) by mouth 2 (two) times daily.   multivitamin capsule Take 1 capsule by mouth daily.   pantoprazole 40 MG tablet Commonly known as: PROTONIX Take 1 tablet (40 mg total) by mouth 2 (two) times daily.   Senna Plus 8.6-50 MG tablet Generic drug: senna-docusate Take 1 tablet by mouth daily.   vitamin C 500 MG tablet Commonly known as: ASCORBIC ACID Take 1,000 mg by mouth daily.        [facility] agency Follow up.   Why: [facility] will reach out to you to schedule your initial visit for home health physical therapy.               Allergies  Allergen Reactions   Indapamide Other (See Comments)    tingling all over body    Consultations: PCCM, GI   Procedures/Studies: CT ANGIO HEAD NECK W WO CM  Result Date: 01/26/2021 CLINICAL DATA:  Cardiopulmonary collapse with syncope. EXAM: CT ANGIOGRAPHY HEAD AND NECK TECHNIQUE: Multidetector CT imaging of the head and neck was performed using the standard protocol during bolus administration of intravenous contrast. Multiplanar CT image reconstructions and MIPs were obtained to evaluate the vascular anatomy. Carotid stenosis measurements (when applicable) are obtained utilizing NASCET criteria, using the distal internal carotid diameter as the denominator. CONTRAST:  114mL OMNIPAQUE IOHEXOL 350 MG/ML SOLN COMPARISON:  None. FINDINGS: CT HEAD FINDINGS Brain: No evidence of acute infarction, hemorrhage, hydrocephalus, extra-axial collection or mass lesion/mass effect. Vascular: See below Skull: Normal. Negative for fracture or focal lesion. Sinuses: Negative Orbits: Negative Review of the MIP images confirms the above findings CTA NECK FINDINGS Aortic arch: Atheromatous plaque with 3 vessel branching. Right carotid system: Narrow appearance of the carotid arteries which is symmetric. Mild atheromatous plaque at the bifurcation. No ulceration or dissection. Left  carotid system: Symmetric diffuse attenuated appearance without focal stenosis, dissection, or beading Vertebral arteries: Proximal subclavian wall thickening which appears atheromatous. No flow limiting stenosis or dissection. There few areas of question luminal irregularity/beading but this is attributed artifact based on reformats. Skeleton: Cervical spine degeneration especially affecting the facets Other neck: No acute finding Upper chest: Focal cylindrically  bronchiectasis in the left upper lobe. Review of the MIP images confirms the above findings CTA HEAD FINDINGS Anterior circulation: Atheromatous plaque on the carotid siphons. No branch occlusion, beading, or aneurysm. Diffusely attenuated vessels which may be from cardiac output in this setting. Posterior circulation: Vertebral and basilar arteries are smoothly contoured and widely patent. No branch occlusion, beading, or aneurysm. Venous sinuses: Diffusely patent Anatomic variants: None significant Review of the MIP images confirms the above findings IMPRESSION: No emergent finding.  Mild atherosclerosis. Electronically Signed   By: Jorje Guild M.D.   On: 01/26/2021 05:33   DG Abd 1 View  Result Date: 01/26/2021 CLINICAL DATA:  NG placement EXAM: ABDOMEN - 1 VIEW COMPARISON:  01/26/2021 FINDINGS: NG coiled in the stomach with the tip in the gastric fundus. Progressive bowel dilatation which is primarily in the colon. Small bowel dilated to lesser extent. Vertebroplasty at approximately the L1 level. IMPRESSION: NG tube coiled in the stomach with the tip in the gastric fundus Progressive large and small bowel dilatation from earlier today. Electronically Signed   By: Franchot Gallo M.D.   On: 01/26/2021 16:25   DG Abdomen 1 View  Result Date: 01/26/2021 CLINICAL DATA:  Orogastric tube placement EXAM: ABDOMEN - 1 VIEW COMPARISON:  None. FINDINGS: Enteric tube with tip at the GE junction. Diffuse lucency of the mid to upper abdomen. The lung bases are clear and heart size is normal. These results were called by telephone at the time of interpretation on 01/26/2021 at 4:33 am to provider JADE SUNG , who verbally acknowledged these results. IMPRESSION: 1. Unexplained lucency over the abdomen, possible large volume pneumoperitoneum. There is pending abdominal CT. 2. Enteric tube with tip at the GE junction and side port at the lower esophagus. Electronically Signed   By: Jorje Guild M.D.   On:  01/26/2021 04:33   MR BRAIN WO CONTRAST  Result Date: 01/27/2021 CLINICAL DATA:  Initial evaluation for anoxic brain injury. EXAM: MRI HEAD WITHOUT CONTRAST TECHNIQUE: Multiplanar, multiecho pulse sequences of the brain and surrounding structures were obtained without intravenous contrast. COMPARISON:  Prior CT from 01/26/2021. FINDINGS: Brain: Cerebral volume within normal limits. No focal parenchymal signal abnormality or significant cerebral white matter disease. No signal changes to suggest acute anoxic brain injury. No evidence for acute or subacute infarct. Gray-white matter differentiation maintained. No encephalomalacia to suggest chronic cortical infarction. No evidence for acute or chronic intracranial hemorrhage. No mass lesion, midline shift or mass effect. No hydrocephalus or extra-axial fluid collection. Pituitary gland suprasellar region normal. Midline structures intact and normal. Vascular: Major intracranial vascular flow voids are well maintained. Skull and upper cervical spine: Craniocervical junction within normal limits. Bone marrow signal intensity normal. No scalp soft tissue abnormality. Sinuses/Orbits: Globes and orbital soft tissues demonstrate no acute finding. Mild scattered mucosal thickening noted within the ethmoidal air cells. Paranasal sinuses are otherwise clear. Trace right mastoid effusion noted. Patient is intubated. Other: None. IMPRESSION: Normal MRI of the brain. No evidence for anoxic brain injury or other abnormality. Electronically Signed   By: Jeannine Boga M.D.   On: 01/27/2021 23:05   DG Chest Port 1  View  Result Date: 01/29/2021 CLINICAL DATA:  Intubation EXAM: PORTABLE CHEST 1 VIEW COMPARISON:  01/26/2021 FINDINGS: Endotracheal tube with tip between the clavicular heads and carina. Enteric tube with tip at the stomach. Asymmetric indistinct density around the left hilum and base. No edema, effusion, or pneumothorax. IMPRESSION: Unremarkable hardware.  Mild atelectasis or infiltrate on the left. Electronically Signed   By: Jorje Guild M.D.   On: 01/29/2021 07:55   DG Chest Port 1 View  Result Date: 01/26/2021 CLINICAL DATA:  Intubation. EXAM: PORTABLE CHEST 1 VIEW COMPARISON:  None. FINDINGS: Endotracheal tube with tip 9 cm above the carina, projecting at the T1 level. Enteric tube with tip at the GE junction and side-port over the lower esophagus. There is pending neck CT which will re-evaluate the position of the hardware. Normal heart size and mediastinal contours. There is no edema, consolidation, effusion, or pneumothorax. IMPRESSION: 1. Endotracheal tube with the tip at T1. Would need 3 cm of advancement to place the tip at the clavicular heads. 2. Enteric tube with tip at the GE junction. 3. No evidence of active cardiopulmonary disease. Electronically Signed   By: Jorje Guild M.D.   On: 01/26/2021 04:31   CT Angio Chest/Abd/Pel for Dissection W and/or W/WO  Result Date: 01/26/2021 CLINICAL DATA:  Syncope with cardiopulmonary arrest EXAM: CT ANGIOGRAPHY CHEST, ABDOMEN AND PELVIS TECHNIQUE: Non-contrast CT of the chest was initially obtained but there is blood pool contrast from prior CTA. Multidetector CT imaging through the chest, abdomen and pelvis was performed using the standard protocol during bolus administration of intravenous contrast. Multiplanar reconstructed images and MIPs were obtained and reviewed to evaluate the vascular anatomy. CONTRAST:  122mL OMNIPAQUE IOHEXOL 350 MG/ML SOLN COMPARISON:  None available FINDINGS: CTA CHEST FINDINGS Cardiovascular: Preferential opacification of the thoracic aorta. No evidence of thoracic aortic aneurysm or dissection. Normal heart size. No pericardial effusion. Atheromatous wall thickening of the aorta. Mediastinum/Nodes: No hematoma or pneumomediastinum. The enteric tube traverses the non thickened esophagus. Lungs/Pleura: Improved endotracheal tube positioning from prior radiography, tip  near the clavicular heads. Focal cylindrical bronchiectasis to a limited degree in the left upper lobe. There is no edema, consolidation, effusion, or pneumothorax. Musculoskeletal: No acute or aggressive finding. Review of the MIP images confirms the above findings. CTA ABDOMEN AND PELVIS FINDINGS VASCULAR Aorta: Atheromatous wall thickening of the aorta. No aneurysm or dissection Celiac: Narrow vessels likely from shot mediated constriction. The GDA and gastroepiploic artery are in close proximity to the ulcer base. No active bleeding or pseudoaneurysm is seen SMA: Diffusely attenuated appearance likely from vaso constriction. Replaced right hepatic artery. Renals: Unremarkable IMA: Patent Inflow: Atheromatous plaque with diffuse constricted appearance. Veins: No venous phase.  No significant finding. Review of the MIP images confirms the above findings. NON-VASCULAR Hepatobiliary: Probable hepatic steatosis, but certainty is diminished by contrast timing. Collapsed gallbladder. Gallbladder fossa fat edema is attributed to adjacent inflammation Pancreas: Atrophic fatty infiltration Spleen: Negative Adrenals/Urinary Tract: Unremarkable kidneys. Foley catheter in good position. Stomach/Bowel: Fluid dilated stomach with high-density material in the proximal lumen. There is an indistinguishable wall at the duodenal bulb with medially and posteriorly directed ulcer broadly excavating along the pancreaticoduodenal groove. Frothy material in at the level of the ulcer. Lymphatic: Negative for adenopathy mass. Reproductive: Hysterectomy Other: No ascites or pneumoperitoneum Musculoskeletal: Remote L1 compression fracture with cement augmentation. Review of the MIP images confirms the above findings. IMPRESSION: Large and deep ulceration at the duodenal bulb, usually peptic but given the size,  ulcerated tumor is also considered. The GDA and gastroepiploic arteries are at the ulcer base and there is suspected blood clot  within the dilated stomach, please correlate with NG tube aspiration. No evidence of active bleeding. No pneumoperitoneum. Electronically Signed   By: Jorje Guild M.D.   On: 01/26/2021 05:44     Subjective: No acute issues or events overnight denies nausea vomiting diarrhea constipation headache fevers chills chest pain hematemesis hematuria hematochezia or melena.   Discharge Exam: Vitals:   01/31/21 0553 01/31/21 0809  BP: 130/70 136/80  Pulse: 85 84  Resp: 18 16  Temp: 98.4 F (36.9 C) 98.2 F (36.8 C)  SpO2: 100% 97%   Vitals:   01/30/21 2016 01/31/21 0500 01/31/21 0553 01/31/21 0809  BP: 139/79  130/70 136/80  Pulse: (!) 102  85 84  Resp: 20  18 16   Temp: 98.9 F (37.2 C)  98.4 F (36.9 C) 98.2 F (36.8 C)  TempSrc: Oral  Oral Oral  SpO2: 98%  100% 97%  Weight:  54.4 kg    Height:        General: Pt is alert, awake, not in acute distress Cardiovascular: RRR, S1/S2 +, no rubs, no gallops Respiratory: CTA bilaterally, no wheezing, no rhonchi Abdominal: Soft, NT, ND, bowel sounds + Extremities: no edema, no cyanosis    The results of significant diagnostics from this hospitalization (including imaging, microbiology, ancillary and laboratory) are listed below for reference.     Microbiology: Recent Results (from the past 240 hour(s))  Resp Panel by RT-PCR (Flu A&B, Covid) Nasopharyngeal Swab     Status: None   Collection Time: 01/26/21  4:03 AM   Specimen: Nasopharyngeal Swab; Nasopharyngeal(NP) swabs in vial transport medium  Result Value Ref Range Status   SARS Coronavirus 2 by RT PCR NEGATIVE NEGATIVE Final    Comment: (NOTE) SARS-CoV-2 target nucleic acids are NOT DETECTED.  The SARS-CoV-2 RNA is generally detectable in upper respiratory specimens during the acute phase of infection. The lowest concentration of SARS-CoV-2 viral copies this assay can detect is 138 copies/mL. A negative result does not preclude SARS-Cov-2 infection and should not be  used as the sole basis for treatment or other patient management decisions. A negative result may occur with  improper specimen collection/handling, submission of specimen other than nasopharyngeal swab, presence of viral mutation(s) within the areas targeted by this assay, and inadequate number of viral copies(<138 copies/mL). A negative result must be combined with clinical observations, patient history, and epidemiological information. The expected result is Negative.  Fact Sheet for Patients:  EntrepreneurPulse.com.au  Fact Sheet for Healthcare Providers:  IncredibleEmployment.be  This test is no t yet approved or cleared by the [place] FDA and  has been authorized for detection and/or diagnosis of SARS-CoV-2 by FDA under an Emergency Use Authorization (EUA). This EUA will remain  in effect (meaning this test can be used) for the duration of the COVID-19 declaration under Section 564(b)(1) of the Act, 21 U.S.C.section 360bbb-3(b)(1), unless the authorization is terminated  or revoked sooner.       Influenza A by PCR NEGATIVE NEGATIVE Final   Influenza B by PCR NEGATIVE NEGATIVE Final    Comment: (NOTE) The Xpert Xpress SARS-CoV-2/FLU/RSV plus assay is intended as an aid in the diagnosis of influenza from Nasopharyngeal swab specimens and should not be used as a sole basis for treatment. Nasal washings and aspirates are unacceptable for Xpert Xpress SARS-CoV-2/FLU/RSV testing.  Fact Sheet for Patients: EntrepreneurPulse.com.au  Fact Sheet  for Healthcare Providers: IncredibleEmployment.be  This test is not yet approved or cleared by the [place] and has been authorized for detection and/or diagnosis of SARS-CoV-2 by FDA under an Emergency Use Authorization (EUA). This EUA will remain in effect (meaning this test can be used) for the duration of the COVID-19 declaration under Section  564(b)(1) of the Act, 21 U.S.C. section 360bbb-3(b)(1), unless the authorization is terminated or revoked.  Performed at [facility], [address] [place], [place] [address]   Culture, blood (routine x 2)     Status: None   Collection Time: 01/26/21  4:47 AM   Specimen: BLOOD  Result Value Ref Range Status   Specimen Description BLOOD LEFT ANTECUBITAL  Final   Special Requests   Final    BOTTLES DRAWN AEROBIC AND ANAEROBIC Blood Culture results may not be optimal due to an inadequate volume of blood received in culture bottles   Culture   Final    NO GROWTH 5 DAYS Performed at [facility], [address] [place], [place] [address]    Report Status 01/31/2021 FINAL  Final  Culture, blood (routine x 2)     Status: None   Collection Time: 01/26/21  4:47 AM   Specimen: BLOOD  Result Value Ref Range Status   Specimen Description BLOOD BLOOD LEFT HAND  Final   Special Requests   Final    BOTTLES DRAWN AEROBIC AND ANAEROBIC Blood Culture results may not be optimal due to an inadequate volume of blood received in culture bottles   Culture   Final    NO GROWTH 5 DAYS Performed at [facility], [address], [place], [place] [address]    Report Status 01/31/2021 FINAL  Final  Urine Culture     Status: None   Collection Time: 01/26/21  4:47 AM   Specimen: Urine, Clean Catch  Result Value Ref Range Status   Specimen Description   Final    URINE, CLEAN CATCH Performed at [facility], [address] [place], [place] [address]    Special Requests   Final    NONE Performed at [facility], [address], [place], [place] [address]    Culture   Final    NO GROWTH Performed at [facility], [place] [address], [place], [place] [address]    Report Status 01/27/2021 FINAL  Final  MRSA Next Gen by PCR, Nasal     Status: None   Collection Time: 01/26/21 10:00 AM   Specimen: Nasal Mucosa; Nasal Swab  Result Value Ref Range  Status   MRSA by PCR Next Gen NOT DETECTED NOT DETECTED Final    Comment: (NOTE) The GeneXpert MRSA Assay (FDA approved for NASAL specimens only), is one component of a comprehensive MRSA colonization surveillance program. It is not intended to diagnose MRSA infection nor to guide or monitor treatment for MRSA infections. Test performance is not FDA approved in patients less than 52 years old. Performed at [facility], [address] [place], [place] [address]      Labs: BNP (last 3 results) No results for input(s): BNP in the last 8760 hours. Basic Metabolic Panel: Recent Labs  Lab 01/27/21 0349 01/27/21 2154 01/28/21 0337 01/29/21 0359 01/29/21 1700 01/30/21 0321 01/30/21 1736 01/31/21 0607  NA 137  --  135 137  --  136  --  137  K 2.8* 3.8 3.7 3.7  --  2.8*  --  4.4  CL  104  --  105 108  --  103  --  104  CO2 29  --  27 24  --  25  --  27  GLUCOSE 103*  --  98 104*  --  99  --  105*  BUN 24*  --  19 16  --  8  --  9  CREATININE 0.92  --  0.67 0.60  --  0.58  --  0.60  CALCIUM 7.5*  --  8.1* 8.1*  --  8.5*  --  8.8*  MG 2.0  --  2.0  --  1.8 2.0 1.9  --   PHOS 2.8  --  3.4  --  3.5 2.9 2.3*  --    Liver Function Tests: Recent Labs  Lab 01/26/21 0403  AST 23  ALT 13  ALKPHOS 46  BILITOT 0.5  PROT 4.6*  ALBUMIN 2.2*   Recent Labs  Lab 01/26/21 0403  LIPASE 27   No results for input(s): AMMONIA in the last 168 hours. CBC: Recent Labs  Lab 01/26/21 0403 01/26/21 0447 01/27/21 0349 01/27/21 1029 01/28/21 0337 01/28/21 0901 01/29/21 0530 01/29/21 0931 01/29/21 1501 01/29/21 2159 01/30/21 0321 01/31/21 0607  WBC 18.6*  --  11.3*  --  9.9  --  11.6*  --   --   --  10.5 9.9  NEUTROABS 10.4*  --  8.4*  --   --   --   --   --   --   --   --   --   HGB 3.8*   < > 8.7*   < > 8.5*   < > 9.1* 8.8* 8.9* 8.5* 9.3* 10.1*  HCT 13.7*   < > 24.6*   < > 25.2*   < > 26.9* 26.3* 27.4* 25.7* 27.6* 30.6*  MCV 102.2*  --  80.4  --  85.4  --  84.9  --   --    --  85.4 85.7  PLT 491*  --  210  --  167  --  188  --   --   --  253 317   < > = values in this interval not displayed.   Cardiac Enzymes: No results for input(s): CKTOTAL, CKMB, CKMBINDEX, TROPONINI in the last 168 hours. BNP: Invalid input(s): POCBNP CBG: Recent Labs  Lab 01/30/21 0401 01/30/21 0734 01/30/21 1135 01/31/21 0809 01/31/21 1207  GLUCAP 88 101* 118* 94 117*   D-Dimer No results for input(s): DDIMER in the last 72 hours. Hgb A1c Recent Labs    01/30/21 0926  HGBA1C 5.2   Lipid Profile Recent Labs    01/30/21 0321  TRIG 86   Thyroid function studies No results for input(s): TSH, T4TOTAL, T3FREE, THYROIDAB in the last 72 hours.  Invalid input(s): FREET3 Anemia work up Recent Labs    01/28/21 1501  VITAMINB12 650   Urinalysis    Component Value Date/Time   COLORURINE YELLOW (A) 01/26/2021 0447   APPEARANCEUR HAZY (A) 01/26/2021 0447   APPEARANCEUR Clear 08/10/2011 2109   LABSPEC >1.046 (H) 01/26/2021 0447   LABSPEC 1.002 08/10/2011 2109   PHURINE 5.0 01/26/2021 0447   GLUCOSEU 50 (A) 01/26/2021 0447   GLUCOSEU Negative 08/10/2011 2109   HGBUR MODERATE (A) 01/26/2021 0447   BILIRUBINUR NEGATIVE 01/26/2021 0447   BILIRUBINUR Negative 08/10/2011 2109   KETONESUR NEGATIVE 01/26/2021 0447   PROTEINUR 30 (A) 01/26/2021 0447   NITRITE NEGATIVE 01/26/2021 0447   LEUKOCYTESUR SMALL (A) 01/26/2021 0447  LEUKOCYTESUR Negative 08/10/2011 2109   Sepsis Labs Invalid input(s): PROCALCITONIN,  WBC,  LACTICIDVEN Microbiology Recent Results (from the past 240 hour(s))  Resp Panel by RT-PCR (Flu A&B, Covid) Nasopharyngeal Swab     Status: None   Collection Time: 01/26/21  4:03 AM   Specimen: Nasopharyngeal Swab; Nasopharyngeal(NP) swabs in vial transport medium  Result Value Ref Range Status   SARS Coronavirus 2 by RT PCR NEGATIVE NEGATIVE Final    Comment: (NOTE) SARS-CoV-2 target nucleic acids are NOT DETECTED.  The SARS-CoV-2 RNA is generally  detectable in upper respiratory specimens during the acute phase of infection. The lowest concentration of SARS-CoV-2 viral copies this assay can detect is 138 copies/mL. A negative result does not preclude SARS-Cov-2 infection and should not be used as the sole basis for treatment or other patient management decisions. A negative result may occur with  improper specimen collection/handling, submission of specimen other than nasopharyngeal swab, presence of viral mutation(s) within the areas targeted by this assay, and inadequate number of viral copies(<138 copies/mL). A negative result must be combined with clinical observations, patient history, and epidemiological information. The expected result is Negative.  Fact Sheet for Patients:  EntrepreneurPulse.com.au  Fact Sheet for Healthcare Providers:  IncredibleEmployment.be  This test is no t yet approved or cleared by the [place] FDA and  has been authorized for detection and/or diagnosis of SARS-CoV-2 by FDA under an Emergency Use Authorization (EUA). This EUA will remain  in effect (meaning this test can be used) for the duration of the COVID-19 declaration under Section 564(b)(1) of the Act, 21 U.S.C.section 360bbb-3(b)(1), unless the authorization is terminated  or revoked sooner.       Influenza A by PCR NEGATIVE NEGATIVE Final   Influenza B by PCR NEGATIVE NEGATIVE Final    Comment: (NOTE) The Xpert Xpress SARS-CoV-2/FLU/RSV plus assay is intended as an aid in the diagnosis of influenza from Nasopharyngeal swab specimens and should not be used as a sole basis for treatment. Nasal washings and aspirates are unacceptable for Xpert Xpress SARS-CoV-2/FLU/RSV testing.  Fact Sheet for Patients: EntrepreneurPulse.com.au  Fact Sheet for Healthcare Providers: IncredibleEmployment.be  This test is not yet approved or cleared by the [place] FDA  and has been authorized for detection and/or diagnosis of SARS-CoV-2 by FDA under an Emergency Use Authorization (EUA). This EUA will remain in effect (meaning this test can be used) for the duration of the COVID-19 declaration under Section 564(b)(1) of the Act, 21 U.S.C. section 360bbb-3(b)(1), unless the authorization is terminated or revoked.  Performed at [facility], [address] [place], [place] [address]   Culture, blood (routine x 2)     Status: None   Collection Time: 01/26/21  4:47 AM   Specimen: BLOOD  Result Value Ref Range Status   Specimen Description BLOOD LEFT ANTECUBITAL  Final   Special Requests   Final    BOTTLES DRAWN AEROBIC AND ANAEROBIC Blood Culture results may not be optimal due to an inadequate volume of blood received in culture bottles   Culture   Final    NO GROWTH 5 DAYS Performed at [facility], [address] [place], [place] [address]    Report Status 01/31/2021 FINAL  Final  Culture, blood (routine x 2)     Status: None   Collection Time: 01/26/21  4:47 AM   Specimen: BLOOD  Result Value Ref Range Status   Specimen Description BLOOD BLOOD LEFT HAND  Final   Special Requests  Final    BOTTLES DRAWN AEROBIC AND ANAEROBIC Blood Culture results may not be optimal due to an inadequate volume of blood received in culture bottles   Culture   Final    NO GROWTH 5 DAYS Performed at [facility], [address], [place], [place] [address]    Report Status 01/31/2021 FINAL  Final  Urine Culture     Status: None   Collection Time: 01/26/21  4:47 AM   Specimen: Urine, Clean Catch  Result Value Ref Range Status   Specimen Description   Final    URINE, CLEAN CATCH Performed at [facility], [address] [place], [place] [address]    Special Requests   Final    NONE Performed at [facility], [address], [place], [place] [address]    Culture   Final    NO GROWTH Performed at [facility], [place] [address] [place], [place] [address]    Report Status 01/27/2021 FINAL  Final  MRSA Next Gen by PCR, Nasal     Status: None   Collection Time: 01/26/21 10:00 AM   Specimen: Nasal Mucosa; Nasal Swab  Result Value Ref Range Status   MRSA by PCR Next Gen NOT DETECTED NOT DETECTED Final    Comment: (NOTE) The GeneXpert MRSA Assay (FDA approved for NASAL specimens only), is one component of a comprehensive MRSA colonization surveillance program. It is not intended to diagnose MRSA infection nor to guide or monitor treatment for MRSA infections. Test performance is not FDA approved in patients less than 5 years old. Performed at [facility], [address] [place], [place] [address]      Time coordinating discharge: Over 30 minutes  SIGNED:   Little Ishikawa, DO [facility] 01/31/2021, 2:20 PM Pager   If 7PM-7AM, please contact night-coverage [email or web address]

## 2021-01-31 NOTE — Plan of Care (Signed)
Problem: Education: Goal: Ability to identify signs and symptoms of gastrointestinal bleeding will improve Outcome: Completed/Met   Problem: Bowel/Gastric: Goal: Will show no signs and symptoms of gastrointestinal bleeding Outcome: Completed/Met   Problem: Fluid Volume: Goal: Will show no signs and symptoms of excessive bleeding Outcome: Completed/Met   Problem: Clinical Measurements: Goal: Complications related to the disease process, condition or treatment will be avoided or minimized Outcome: Completed/Met   Problem: Education: Goal: Knowledge of General Education information will improve Description: Including pain rating scale, medication(s)/side effects and non-pharmacologic comfort measures Outcome: Completed/Met   Problem: Health Behavior/Discharge Planning: Goal: Ability to manage health-related needs will improve Outcome: Completed/Met   Problem: Clinical Measurements: Goal: Ability to maintain clinical measurements within normal limits will improve Outcome: Completed/Met Goal: Will remain free from infection Outcome: Completed/Met Goal: Diagnostic test results will improve Outcome: Completed/Met Goal: Respiratory complications will improve Outcome: Completed/Met Goal: Cardiovascular complication will be avoided Outcome: Completed/Met   Problem: Activity: Goal: Risk for activity intolerance will decrease Outcome: Completed/Met   Problem: Nutrition: Goal: Adequate nutrition will be maintained Outcome: Completed/Met   Problem: Coping: Goal: Level of anxiety will decrease Outcome: Completed/Met   Problem: Elimination: Goal: Will not experience complications related to bowel motility Outcome: Completed/Met Goal: Will not experience complications related to urinary retention Outcome: Completed/Met   Problem: Pain Managment: Goal: General experience of comfort will improve Outcome: Completed/Met   Problem: Safety: Goal: Ability to remain free from  injury will improve Outcome: Completed/Met   Problem: Skin Integrity: Goal: Risk for impaired skin integrity will decrease Outcome: Completed/Met   Problem: Activity: Goal: Ability to tolerate increased activity will improve Outcome: Completed/Met   Problem: Respiratory: Goal: Ability to maintain a clear airway and adequate ventilation will improve Outcome: Completed/Met   Problem: Role Relationship: Goal: Method of communication will improve Outcome: Completed/Met

## 2021-02-01 ENCOUNTER — Ambulatory Visit: Payer: Medicare HMO | Admitting: Pain Medicine

## 2021-03-14 DIAGNOSIS — K279 Peptic ulcer, site unspecified, unspecified as acute or chronic, without hemorrhage or perforation: Secondary | ICD-10-CM | POA: Diagnosis not present

## 2021-04-04 ENCOUNTER — Ambulatory Visit: Payer: Medicare HMO | Admitting: Adult Health

## 2021-04-13 ENCOUNTER — Other Ambulatory Visit: Payer: Self-pay | Admitting: Gastroenterology

## 2021-04-13 DIAGNOSIS — K311 Adult hypertrophic pyloric stenosis: Secondary | ICD-10-CM | POA: Diagnosis not present

## 2021-04-13 DIAGNOSIS — T182XXA Foreign body in stomach, initial encounter: Secondary | ICD-10-CM | POA: Diagnosis not present

## 2021-04-13 DIAGNOSIS — Z538 Procedure and treatment not carried out for other reasons: Secondary | ICD-10-CM | POA: Diagnosis not present

## 2021-04-13 DIAGNOSIS — K279 Peptic ulcer, site unspecified, unspecified as acute or chronic, without hemorrhage or perforation: Secondary | ICD-10-CM | POA: Diagnosis not present

## 2021-04-13 DIAGNOSIS — K222 Esophageal obstruction: Secondary | ICD-10-CM | POA: Diagnosis not present

## 2021-04-14 ENCOUNTER — Ambulatory Visit
Admission: RE | Admit: 2021-04-14 | Discharge: 2021-04-14 | Disposition: A | Discharge status: Home / Self Care | Payer: Medicare HMO | Source: Ambulatory Visit | Attending: Gastroenterology | Admitting: Gastroenterology

## 2021-04-14 ENCOUNTER — Other Ambulatory Visit: Payer: Self-pay

## 2021-04-14 DIAGNOSIS — K311 Adult hypertrophic pyloric stenosis: Secondary | ICD-10-CM | POA: Diagnosis not present

## 2021-04-14 DIAGNOSIS — Z0389 Encounter for observation for other suspected diseases and conditions ruled out: Secondary | ICD-10-CM | POA: Diagnosis not present

## 2021-04-17 ENCOUNTER — Encounter: Payer: Self-pay | Admitting: *Deleted

## 2021-04-17 ENCOUNTER — Ambulatory Visit
Admission: RE | Admit: 2021-04-17 | Discharge: 2021-04-17 | Disposition: A | Discharge status: Home / Self Care | Payer: Medicare HMO | Attending: Gastroenterology | Admitting: Gastroenterology

## 2021-04-17 ENCOUNTER — Ambulatory Visit: Payer: Medicare HMO | Admitting: Certified Registered Nurse Anesthetist

## 2021-04-17 ENCOUNTER — Encounter
Admission: RE | Disposition: A | Discharge status: Home / Self Care | Payer: Self-pay | Source: Home / Self Care | Attending: Gastroenterology

## 2021-04-17 DIAGNOSIS — F419 Anxiety disorder, unspecified: Secondary | ICD-10-CM | POA: Diagnosis not present

## 2021-04-17 DIAGNOSIS — F32A Depression, unspecified: Secondary | ICD-10-CM | POA: Insufficient documentation

## 2021-04-17 DIAGNOSIS — K219 Gastro-esophageal reflux disease without esophagitis: Secondary | ICD-10-CM | POA: Diagnosis not present

## 2021-04-17 DIAGNOSIS — K311 Adult hypertrophic pyloric stenosis: Secondary | ICD-10-CM | POA: Diagnosis not present

## 2021-04-17 DIAGNOSIS — J45909 Unspecified asthma, uncomplicated: Secondary | ICD-10-CM | POA: Diagnosis not present

## 2021-04-17 DIAGNOSIS — I10 Essential (primary) hypertension: Secondary | ICD-10-CM | POA: Diagnosis not present

## 2021-04-17 DIAGNOSIS — Z79899 Other long term (current) drug therapy: Secondary | ICD-10-CM | POA: Insufficient documentation

## 2021-04-17 DIAGNOSIS — R69 Illness, unspecified: Secondary | ICD-10-CM | POA: Diagnosis not present

## 2021-04-17 HISTORY — PX: ESOPHAGOGASTRODUODENOSCOPY (EGD) WITH PROPOFOL: SHX5813

## 2021-04-17 SURGERY — ESOPHAGOGASTRODUODENOSCOPY (EGD) WITH PROPOFOL
Anesthesia: General

## 2021-04-17 MED ORDER — LIDOCAINE HCL (CARDIAC) PF 100 MG/5ML IV SOSY
PREFILLED_SYRINGE | INTRAVENOUS | Status: DC | PRN
  Administered 2021-04-17: 60 mg via INTRAVENOUS

## 2021-04-17 MED ORDER — PROPOFOL 500 MG/50ML IV EMUL
INTRAVENOUS | Status: DC | PRN
  Administered 2021-04-17: 150 ug/kg/min via INTRAVENOUS

## 2021-04-17 MED ORDER — PROPOFOL 10 MG/ML IV BOLUS
INTRAVENOUS | Status: DC | PRN
  Administered 2021-04-17: 50 mg via INTRAVENOUS
  Administered 2021-04-17: 20 mg via INTRAVENOUS
  Administered 2021-04-17: 50 mg via INTRAVENOUS
  Administered 2021-04-17: 30 mg via INTRAVENOUS
  Administered 2021-04-17: 10 mg via INTRAVENOUS

## 2021-04-17 MED ORDER — SODIUM CHLORIDE 0.9 % IV SOLN: INTRAVENOUS | Status: DC

## 2021-04-17 MED ORDER — ONDANSETRON HCL 4 MG/2ML IJ SOLN
INTRAMUSCULAR | Status: DC | PRN
  Administered 2021-04-17: 4 mg via INTRAVENOUS

## 2021-04-17 MED ORDER — PHENYLEPHRINE 40 MCG/ML (10ML) SYRINGE FOR IV PUSH (FOR BLOOD PRESSURE SUPPORT)
PREFILLED_SYRINGE | INTRAVENOUS | Status: DC | PRN
  Administered 2021-04-17: 80 ug via INTRAVENOUS

## 2021-04-17 MED ORDER — SUCCINYLCHOLINE CHLORIDE 200 MG/10ML IV SOSY
PREFILLED_SYRINGE | INTRAVENOUS | Status: DC | PRN
  Administered 2021-04-17: 100 mg via INTRAVENOUS

## 2021-04-17 NOTE — Interval H&P Note (Signed)
History and Physical Interval Note: ? ?04/17/2021 ?2:36 PM ? ?Angela Espinoza  has presented today for surgery, with the diagnosis of Peptic Ulcer and Gastric Outlet Obstruction.  The various methods of treatment have been discussed with the patient and family. After consideration of risks, benefits and other options for treatment, the patient has consented to  Procedure(s): ?ESOPHAGOGASTRODUODENOSCOPY (EGD) WITH PROPOFOL (N/A) as a surgical intervention.  The patient's history has been reviewed, patient examined, no change in status, stable for surgery.  I have reviewed the patient's chart and labs.  Questions were answered to the patient's satisfaction.   ? ? ?Hilton Cork Delonta Yohannes ? ?Ok to proceed with EGD ?

## 2021-04-17 NOTE — Anesthesia Procedure Notes (Signed)
Procedure Name: Intubation ?Date/Time: 04/17/2021 2:54 PM ?Performed by: Johnna Acosta, CRNA ?Pre-anesthesia Checklist: Patient identified, Emergency Drugs available, Suction available, Patient being monitored and Timeout performed ?Patient Re-evaluated:Patient Re-evaluated prior to induction ?Oxygen Delivery Method: Circle system utilized ?Preoxygenation: Pre-oxygenation with 100% oxygen ?Induction Type: IV induction, Rapid sequence and Cricoid Pressure applied ?Laryngoscope Size: McGraph and 3 ?Grade View: Grade I ?Tube type: Oral ?Tube size: 7.0 mm ?Number of attempts: 1 ?Airway Equipment and Method: Stylet and Video-laryngoscopy ?Placement Confirmation: ETT inserted through vocal cords under direct vision, positive ETCO2 and breath sounds checked- equal and bilateral ?Secured at: 21 cm ?Tube secured with: Tape ?Dental Injury: Teeth and Oropharynx as per pre-operative assessment  ? ? ? ? ?

## 2021-04-17 NOTE — Anesthesia Preprocedure Evaluation (Signed)
Anesthesia Evaluation  ?Patient identified by MRN, date of birth, ID band ?Patient awake ? ? ? ?Reviewed: ?Allergy & Precautions, H&P , NPO status , Patient's Chart, lab work & pertinent test results, reviewed documented beta blocker date and time  ? ?Airway ?Mallampati: II ? ? ?Neck ROM: full ? ? ? Dental ? ?(+) Poor Dentition ?  ?Pulmonary ?asthma ,  ?  ?Pulmonary exam normal ? ? ? ? ? ? ? Cardiovascular ?Exercise Tolerance: Good ?hypertension, Pt. on medications and On Medications ?negative cardio ROS ?Normal cardiovascular exam ?Rhythm:regular Rate:Normal ? ? ?  ?Neuro/Psych ?PSYCHIATRIC DISORDERS Anxiety Depression negative neurological ROS ?   ? GI/Hepatic ?Neg liver ROS, GERD  Medicated,  ?Endo/Other  ?negative endocrine ROS ? Renal/GU ?negative Renal ROS  ?negative genitourinary ?  ?Musculoskeletal ? ? Abdominal ?  ?Peds ? Hematology ?negative hematology ROS ?(+)   ?Anesthesia Other Findings ?Past Medical History: ?No date: Anxiety ?No date: Asthma ?    Comment:  NO INHALER-SMOKE AND PERFUME TRIGGERS ASTHMA ?No date: Depression ?No date: Hyperlipidemia ?No date: Hypertension ?No date: IBS (irritable bowel syndrome) ?Past Surgical History: ?age 5: ABDOMINAL HYSTERECTOMY ?08/03/2014: AXILLARY LYMPH NODE BIOPSY; Right ?    Comment:  Procedure: EXCISION RIGHT  AXILLARY LIPOMATOSIS;   ?             Surgeon: Nadeen Landau, MD;  Location: [facility] ORS;   ?             Service: General;  Laterality: Right; ?10/07/2018: BREAST BIOPSY; Left ?    Comment:  Stereo, Coil Clip, BENIGN BREAST TISSUE WITH AREAS  ?             CONTAINING A PREDOMINANCE OF ADIPOSE TISSUE ?10/21/2019: BREAST BIOPSY; Left ?    Comment:  stereo, ribbon clip, path pending  ?No date: COLONOSCOPY ?01/26/2021: ESOPHAGOGASTRODUODENOSCOPY (EGD) WITH PROPOFOL; N/A ?    Comment:  Procedure: ESOPHAGOGASTRODUODENOSCOPY (EGD) WITH  ?             PROPOFOL;  Surgeon: Regis Bill, MD;  Location:  ?             [facility]  ENDOSCOPY;  Service: Endoscopy;  Laterality: N/A; ?No date: EXPLORATORY LAPAROTOMY ?No date: FOOT SURGERY ?12/10/2014: KYPHOPLASTY; N/A ?    Comment:  Procedure: KYPHOPLASTY L 1;  Surgeon: Kennedy Bucker, MD;  ?             Location: [facility] ORS;  Service: Orthopedics;  Laterality:  ?             N/A; ?No date: OOPHORECTOMY ?No date: TONSILLECTOMY ?BMI   ? Body Mass Index: 19.35 kg/m?  ?  ? Reproductive/Obstetrics ?negative OB ROS ? ?  ? ? ? ? ? ? ? ? ? ? ? ? ? ?  ?  ? ? ? ? ? ? ? ? ?Anesthesia Physical ?Anesthesia Plan ? ?ASA: 2 ? ?Anesthesia Plan: General  ? ?Post-op Pain Management:   ? ?Induction:  ? ?PONV Risk Score and Plan:  ? ?Airway Management Planned:  ? ?Additional Equipment:  ? ?Intra-op Plan:  ? ?Post-operative Plan:  ? ?Informed Consent: I have reviewed the patients History and Physical, chart, labs and discussed the procedure including the risks, benefits and alternatives for the proposed anesthesia with the patient or authorized representative who has indicated his/her understanding and acceptance.  ? ? ? ?Dental Advisory Given ? ?Plan Discussed with: CRNA ? ?Anesthesia Plan Comments:   ? ? ? ? ? ? ?  Anesthesia Quick Evaluation ? ?

## 2021-04-17 NOTE — Anesthesia Procedure Notes (Signed)
Date/Time: 04/17/2021 2:44 PM ?Performed by: Malva Cogan, CRNA ?Pre-anesthesia Checklist: Patient identified, Emergency Drugs available, Suction available, Patient being monitored and Timeout performed ?Patient Re-evaluated:Patient Re-evaluated prior to induction ?Oxygen Delivery Method: Nasal cannula ?Induction Type: IV induction ?Placement Confirmation: CO2 detector and positive ETCO2 ? ? ? ? ?

## 2021-04-17 NOTE — Transfer of Care (Signed)
Immediate Anesthesia Transfer of Care Note ? ?Patient: Angela Espinoza ? ?Procedure(s) Performed: ESOPHAGOGASTRODUODENOSCOPY (EGD) WITH PROPOFOL ? ?Patient Location: PACU ? ?Anesthesia Type:General ? ?Level of Consciousness: awake, alert  and oriented ? ?Airway & Oxygen Therapy: Patient Spontanous Breathing ? ?Post-op Assessment: Report given to RN and Post -op Vital signs reviewed and stable ? ?Post vital signs: Reviewed and stable ? ?Last Vitals:  ?Vitals Value Taken Time  ?BP    ?Temp    ?Pulse 78 04/17/21 1537  ?Resp 15 04/17/21 1537  ?SpO2    ? ? ?Last Pain:  ?Vitals:  ? 04/17/21 1537  ?TempSrc: Temporal  ?PainSc: 0-No pain  ?   ? ?  ? ?Complications: No notable events documented. ?

## 2021-04-17 NOTE — H&P (Signed)
Outpatient short stay form Pre-procedure ?04/17/2021  ?Angela Rubenstein, MD ? ?Primary Physician: [facility] ? ?Reason for visit:  Pyloric stenosis/Gastric outlet obstruction ? ?History of present illness:   ? ?66 y/o lady with history of large duodenal ulcer that appears after healing has caused gastric outlet obstruction. Here for attempt of balloon dilation. No blood thinners. No family history of GI malignancies. No significant abdominal surgeries. ? ? ? ?Current Facility-Administered Medications:  ?  0.9 %  sodium chloride infusion, , Intravenous, Continuous, Khayree Delellis, Hilton Cork, MD ? ?Medications Prior to Admission  ?Medication Sig Dispense Refill Last Dose  ? Biotin 1000 MCG CHEW Chew 1,000 mcg by mouth daily.    Past Week  ? BLACK ELDERBERRY PO Take 1 capsule by mouth daily.    Past Week  ? Calcium Carbonate-Vitamin D (CALCIUM 500 + D PO) Take 1 tablet by mouth daily.   Past Week  ? metoprolol tartrate (LOPRESSOR) 25 MG tablet Take 0.5 tablets (12.5 mg total) by mouth 2 (two) times daily. 30 tablet 1 04/17/2021  ? Multiple Vitamin (MULTIVITAMIN) capsule Take 1 capsule by mouth daily.   Past Week  ? Omega-3 Fatty Acids (FISH OIL PO) Take 1 tablet by mouth daily.   Past Week  ? pantoprazole (PROTONIX) 40 MG tablet Take 1 tablet (40 mg total) by mouth 2 (two) times daily. 60 tablet 1 04/16/2021  ? vitamin C (ASCORBIC ACID) 500 MG tablet Take 1,000 mg by mouth daily.    Past Week  ? Black Pepper-Turmeric 3-500 MG CAPS Take by mouth.     ? DULoxetine (CYMBALTA) 30 MG capsule Take 30 mg by mouth 2 (two) times daily. (Patient not taking: Reported on 04/17/2021)   Not Taking  ? gabapentin (NEURONTIN) 300 MG capsule Take 300 mg by mouth 3 (three) times daily. (Patient not taking: Reported on 04/17/2021)   Not Taking  ? SENNA PLUS 8.6-50 MG tablet Take 1 tablet by mouth daily.     ? ? ? ?Allergies  ?Allergen Reactions  ? Indapamide Other (See Comments)  ?  tingling all over body  ? ? ? ?Past Medical  History:  ?Diagnosis Date  ? Anxiety   ? Asthma   ? NO INHALER-SMOKE AND PERFUME TRIGGERS ASTHMA  ? Depression   ? Hyperlipidemia   ? Hypertension   ? IBS (irritable bowel syndrome)   ? ? ?Review of systems:  Otherwise negative.  ? ? ?Physical Exam ? ?Gen: Alert, oriented. Appears stated age.  ?HEENT: PERRLA. ?Lungs: No respiratory distress ?CV: RRR ?Abd: soft, benign, no masses ?Ext: No edema ? ? ? ?Planned procedures: Proceed with EGD. The patient understands the nature of the planned procedure, indications, risks, alternatives and potential complications including but not limited to bleeding, infection, perforation, damage to internal organs and possible oversedation/side effects from anesthesia. The patient agrees and gives consent to proceed.  ?Please refer to procedure notes for findings, recommendations and patient disposition/instructions.  ? ? ? ?Angela Rubenstein, MD ?[facility] ? ? ? ?  ? ?

## 2021-04-17 NOTE — Op Note (Signed)
[facility] ?Gastroenterology ?Patient Name: Angela Espinoza ?Procedure Date: 04/17/2021 2:43 PM ?MRN: [id number] ?Account #: [id number] ?Date of Birth: [birth year] ?Admit Type: Outpatient ?Age: 66 ?Room: [facility] ENDO ROOM 3 ?Gender: Female ?Note Status: Finalized ?Instrument Name: Upper Endoscope [id number] ?Procedure:             Upper GI endoscopy ?Indications:           Abnormal UGI series ?Providers:             Andrey Farmer MD, MD ?Referring MD:          Forest Gleason Md, MD (Referring MD) ?Medicines:             Monitored Anesthesia Care ?Complications:         No immediate complications. Estimated blood loss:  ?                       Minimal. ?Procedure:             Pre-Anesthesia Assessment: ?                       - Prior to the procedure, a History and Physical was  ?                       performed, and patient medications and allergies were  ?                       reviewed. The patient is competent. The risks and  ?                       benefits of the procedure and the sedation options and  ?                       risks were discussed with the patient. All questions  ?                       were answered and informed consent was obtained.  ?                       Patient identification and proposed procedure were  ?                       verified by the physician, the nurse, the  ?                       anesthesiologist, the anesthetist and the technician  ?                       in the endoscopy suite. Mental Status Examination:  ?                       alert and oriented. Airway Examination: normal  ?                       oropharyngeal airway and neck mobility. Respiratory  ?                       Examination: clear to auscultation. CV Examination:  ?  normal. Prophylactic Antibiotics: The patient does not  ?                       require prophylactic antibiotics. Prior  ?                       Anticoagulants: The patient has taken no previous  ?                        anticoagulant or antiplatelet agents. ASA Grade  ?                       Assessment: II - A patient with mild systemic disease.  ?                       After reviewing the risks and benefits, the patient  ?                       was deemed in satisfactory condition to undergo the  ?                       procedure. The anesthesia plan was to use general  ?                       anesthesia. Immediately prior to administration of  ?                       medications, the patient was re-assessed for adequacy  ?                       to receive sedatives. The heart rate, respiratory  ?                       rate, oxygen saturations, blood pressure, adequacy of  ?                       pulmonary ventilation, and response to care were  ?                       monitored throughout the procedure. The physical  ?                       status of the patient was re-assessed after the  ?                       procedure. ?                       After obtaining informed consent, the endoscope was  ?                       passed under direct vision. Throughout the procedure,  ?                       the patient's blood pressure, pulse, and oxygen  ?                       saturations were monitored continuously. The Endoscope  ?  was introduced through the mouth, and advanced to the  ?                       second part of duodenum. The upper GI endoscopy was  ?                       somewhat difficult due to presence of food. The  ?                       patient tolerated the procedure well. ?Findings: ?     The examined esophagus was normal. ?     A benign-appearing, intrinsic severe stenosis was found at the pylorus.  ?     The scope could not pass. A TTS dilator was passed through the scope.  ?     Dilation with an 09-06-08 mm and a 11-09-10 mm pyloric balloon dilator was  ?     performed to 11 mm. The dilation site was examined and showed moderate  ?     improvement in luminal narrowing. The adult endoscope  was able to pass  ?     this area after dilation. Estimated blood loss was minimal. ?     The examined duodenum was normal. ?Impression:            - Normal esophagus. ?                       - Gastric stenosis was found at the pylorus. Dilated. ?                       - Normal examined duodenum. ?                       - No specimens collected. ?Recommendation:        - Discharge patient to home. ?                       - Resume previous diet. ?                       - Continue present medications. ?                       - Repeat upper endoscopy at appointment to be  ?                       scheduled for retreatment. ?                       - Return to referring physician as previously  ?                       scheduled. ?Procedure Code(s):     --- Professional --- ?                       [phone], Esophagogastroduodenoscopy, flexible,  ?                       transoral; with dilation of gastric/duodenal  ?                       stricture(s) (eg, balloon,  bougie) ?Diagnosis Code(s):     --- Professional --- ?                       K31.1, Adult hypertrophic pyloric stenosis ?                       R93.3, Abnormal findings on diagnostic imaging of  ?                       other parts of digestive tract ?CPT copyright 2019 American Medical Association. All rights reserved. ?The codes documented in this report are preliminary and upon coder review may  ?be revised to meet current compliance requirements. ?Andrey Farmer MD, MD ?04/17/2021 3:50:39 PM ?Number of Addenda: 0 ?Note Initiated On: 04/17/2021 2:43 PM ?Estimated Blood Loss:  Estimated blood loss was minimal. ?     [facility] ?

## 2021-04-18 ENCOUNTER — Encounter: Payer: Self-pay | Admitting: Gastroenterology

## 2021-04-19 NOTE — Anesthesia Postprocedure Evaluation (Signed)
Anesthesia Post Note ? ?Patient: Angela Espinoza ? ?Procedure(s) Performed: ESOPHAGOGASTRODUODENOSCOPY (EGD) WITH PROPOFOL ? ?Patient location during evaluation: PACU ?Anesthesia Type: General ?Level of consciousness: awake and alert ?Pain management: pain level controlled ?Vital Signs Assessment: post-procedure vital signs reviewed and stable ?Respiratory status: spontaneous breathing, nonlabored ventilation, respiratory function stable and patient connected to nasal cannula oxygen ?Cardiovascular status: blood pressure returned to baseline and stable ?Postop Assessment: no apparent nausea or vomiting ?Anesthetic complications: no ? ? ?No notable events documented. ? ? ?Last Vitals:  ?Vitals:  ? 04/17/21 1613 04/17/21 1617  ?BP: 139/76 132/89  ?Pulse: 77   ?Resp: (!) 27 20  ?Temp:    ?SpO2: 100% 100%  ?  ?Last Pain:  ?Vitals:  ? 04/17/21 1617  ?TempSrc:   ?PainSc: 0-No pain  ? ? ?  ?  ?  ?  ?  ?  ? ?Yevette Edwards ? ? ? ? ?

## 2021-05-01 ENCOUNTER — Encounter: Payer: Self-pay | Admitting: *Deleted

## 2021-05-02 ENCOUNTER — Ambulatory Visit
Admission: RE | Admit: 2021-05-02 | Discharge: 2021-05-02 | Disposition: A | Discharge status: Home / Self Care | Payer: Medicare HMO | Attending: Gastroenterology | Admitting: Gastroenterology

## 2021-05-02 ENCOUNTER — Ambulatory Visit: Payer: Medicare HMO | Admitting: Certified Registered"

## 2021-05-02 ENCOUNTER — Encounter
Admission: RE | Disposition: A | Discharge status: Home / Self Care | Payer: Self-pay | Source: Home / Self Care | Attending: Gastroenterology

## 2021-05-02 ENCOUNTER — Encounter: Payer: Self-pay | Admitting: *Deleted

## 2021-05-02 DIAGNOSIS — Z90710 Acquired absence of both cervix and uterus: Secondary | ICD-10-CM | POA: Insufficient documentation

## 2021-05-02 DIAGNOSIS — K589 Irritable bowel syndrome without diarrhea: Secondary | ICD-10-CM | POA: Diagnosis not present

## 2021-05-02 DIAGNOSIS — K219 Gastro-esophageal reflux disease without esophagitis: Secondary | ICD-10-CM | POA: Diagnosis not present

## 2021-05-02 DIAGNOSIS — F32A Depression, unspecified: Secondary | ICD-10-CM | POA: Diagnosis not present

## 2021-05-02 DIAGNOSIS — F419 Anxiety disorder, unspecified: Secondary | ICD-10-CM | POA: Insufficient documentation

## 2021-05-02 DIAGNOSIS — K311 Adult hypertrophic pyloric stenosis: Secondary | ICD-10-CM | POA: Insufficient documentation

## 2021-05-02 DIAGNOSIS — I10 Essential (primary) hypertension: Secondary | ICD-10-CM | POA: Insufficient documentation

## 2021-05-02 DIAGNOSIS — E785 Hyperlipidemia, unspecified: Secondary | ICD-10-CM | POA: Diagnosis not present

## 2021-05-02 DIAGNOSIS — J45909 Unspecified asthma, uncomplicated: Secondary | ICD-10-CM | POA: Insufficient documentation

## 2021-05-02 DIAGNOSIS — R69 Illness, unspecified: Secondary | ICD-10-CM | POA: Diagnosis not present

## 2021-05-02 HISTORY — PX: ESOPHAGOGASTRODUODENOSCOPY (EGD) WITH PROPOFOL: SHX5813

## 2021-05-02 SURGERY — ESOPHAGOGASTRODUODENOSCOPY (EGD) WITH PROPOFOL
Anesthesia: General

## 2021-05-02 MED ORDER — SUCCINYLCHOLINE CHLORIDE 200 MG/10ML IV SOSY
PREFILLED_SYRINGE | INTRAVENOUS | Status: DC | PRN
  Administered 2021-05-02: 100 mg via INTRAVENOUS

## 2021-05-02 MED ORDER — PROPOFOL 10 MG/ML IV BOLUS
INTRAVENOUS | Status: AC
Start: 2021-05-02 — End: ?
  Filled 2021-05-02: qty 20

## 2021-05-02 MED ORDER — FENTANYL CITRATE (PF) 100 MCG/2ML IJ SOLN
INTRAMUSCULAR | Status: AC
  Filled 2021-05-02: qty 2

## 2021-05-02 MED ORDER — SODIUM CHLORIDE 0.9 % IV SOLN: INTRAVENOUS | Status: DC

## 2021-05-02 MED ORDER — FENTANYL CITRATE (PF) 100 MCG/2ML IJ SOLN
INTRAMUSCULAR | Status: DC | PRN
  Administered 2021-05-02: 50 ug via INTRAVENOUS

## 2021-05-02 MED ORDER — ONDANSETRON HCL 4 MG/2ML IJ SOLN
INTRAMUSCULAR | Status: AC
  Filled 2021-05-02: qty 2

## 2021-05-02 MED ORDER — ONDANSETRON HCL 4 MG/2ML IJ SOLN
INTRAMUSCULAR | Status: DC | PRN
  Administered 2021-05-02: 4 mg via INTRAVENOUS

## 2021-05-02 MED ORDER — LIDOCAINE HCL (CARDIAC) PF 100 MG/5ML IV SOSY
PREFILLED_SYRINGE | INTRAVENOUS | Status: DC | PRN
  Administered 2021-05-02: 100 mg via INTRAVENOUS

## 2021-05-02 MED ORDER — PROPOFOL 10 MG/ML IV BOLUS
INTRAVENOUS | Status: DC | PRN
  Administered 2021-05-02: 100 mg via INTRAVENOUS
  Administered 2021-05-02: 100 ug/kg/min via INTRAVENOUS

## 2021-05-02 MED ORDER — DEXAMETHASONE SODIUM PHOSPHATE 10 MG/ML IJ SOLN
INTRAMUSCULAR | Status: DC | PRN
  Administered 2021-05-02: 5 mg via INTRAVENOUS

## 2021-05-02 MED ORDER — EPHEDRINE 5 MG/ML INJ
INTRAVENOUS | Status: AC
  Filled 2021-05-02: qty 5

## 2021-05-02 MED ORDER — EPHEDRINE SULFATE-NACL 50-0.9 MG/10ML-% IV SOSY
PREFILLED_SYRINGE | INTRAVENOUS | Status: DC | PRN
  Administered 2021-05-02: 10 mg via INTRAVENOUS

## 2021-05-02 MED ORDER — SUCCINYLCHOLINE CHLORIDE 200 MG/10ML IV SOSY
PREFILLED_SYRINGE | INTRAVENOUS | Status: AC
  Filled 2021-05-02: qty 10

## 2021-05-02 MED ORDER — DEXAMETHASONE SODIUM PHOSPHATE 10 MG/ML IJ SOLN
INTRAMUSCULAR | Status: AC
  Filled 2021-05-02: qty 1

## 2021-05-02 MED ORDER — LIDOCAINE HCL (PF) 2 % IJ SOLN
INTRAMUSCULAR | Status: AC
  Filled 2021-05-02: qty 5

## 2021-05-02 NOTE — Anesthesia Procedure Notes (Signed)
Procedure Name: Intubation ?Date/Time: 05/02/2021 7:50 AM ?Performed by: Cammie Sickle, CRNA ?Pre-anesthesia Checklist: Patient identified, Patient being monitored, Timeout performed, Emergency Drugs available and Suction available ?Patient Re-evaluated:Patient Re-evaluated prior to induction ?Oxygen Delivery Method: Circle system utilized ?Preoxygenation: Pre-oxygenation with 100% oxygen ?Induction Type: IV induction, Cricoid Pressure applied and Rapid sequence ?Laryngoscope Size: 3 and McGraph ?Grade View: Grade I ?Tube type: Oral ?Tube size: 6.5 mm ?Number of attempts: 1 ?Airway Equipment and Method: Stylet ?Placement Confirmation: ETT inserted through vocal cords under direct vision, positive ETCO2 and breath sounds checked- equal and bilateral ?Secured at: 21 cm ?Tube secured with: Tape ?Dental Injury: Teeth and Oropharynx as per pre-operative assessment  ? ? ? ? ?

## 2021-05-02 NOTE — Op Note (Signed)
[facility] ?Gastroenterology ?Patient Name: Angela Espinoza ?Procedure Date: 05/02/2021 7:25 AM ?MRN: [id number] ?Account #: [id number] ?Date of Birth: 1955/03/04 ?Admit Type: Outpatient ?Age: 66 ?Room: [facility] ENDO ROOM 1 ?Gender: Female ?Note Status: Finalized ?Instrument Name: Upper Endoscope [id number] ?Procedure:             Upper GI endoscopy ?Indications:           Pyloric stenosis ?Providers:             Andrey Farmer MD, MD ?Referring MD:          Serafina Royals FNP ?Medicines:             Monitored Anesthesia Care ?Complications:         No immediate complications. Estimated blood loss:  ?                       Minimal. ?Procedure:             Pre-Anesthesia Assessment: ?                       - Prior to the procedure, a History and Physical was  ?                       performed, and patient medications and allergies were  ?                       reviewed. The patient is competent. The risks and  ?                       benefits of the procedure and the sedation options and  ?                       risks were discussed with the patient. All questions  ?                       were answered and informed consent was obtained.  ?                       Patient identification and proposed procedure were  ?                       verified by the physician, the nurse, the  ?                       anesthesiologist, the anesthetist and the technician  ?                       in the endoscopy suite. Mental Status Examination:  ?                       alert and oriented. Airway Examination: normal  ?                       oropharyngeal airway and neck mobility. Respiratory  ?                       Examination: clear to auscultation. CV Examination:  ?  normal. Prophylactic Antibiotics: The patient does not  ?                       require prophylactic antibiotics. Prior  ?                       Anticoagulants: The patient has taken no previous  ?                       anticoagulant or  antiplatelet agents. ASA Grade  ?                       Assessment: II - A patient with mild systemic disease.  ?                       After reviewing the risks and benefits, the patient  ?                       was deemed in satisfactory condition to undergo the  ?                       procedure. The anesthesia plan was to use general  ?                       anesthesia. Immediately prior to administration of  ?                       medications, the patient was re-assessed for adequacy  ?                       to receive sedatives. The heart rate, respiratory  ?                       rate, oxygen saturations, blood pressure, adequacy of  ?                       pulmonary ventilation, and response to care were  ?                       monitored throughout the procedure. The physical  ?                       status of the patient was re-assessed after the  ?                       procedure. ?                       After obtaining informed consent, the endoscope was  ?                       passed under direct vision. Throughout the procedure,  ?                       the patient's blood pressure, pulse, and oxygen  ?                       saturations were monitored continuously. The Endoscope  ?  was introduced through the mouth, and advanced to the  ?                       second part of duodenum. The upper GI endoscopy was  ?                       somewhat difficult due to narrowing. The patient  ?                       tolerated the procedure well. ?Findings: ?     The examined esophagus was normal. ?     A benign-appearing, intrinsic severe stenosis was found at the pylorus.  ?     This was traversed. A TTS dilator was passed through the scope. Dilation  ?     with a 15 mm pyloric balloon dilator was performed. The dilation site  ?     was examined and showed moderate improvement in luminal narrowing.  ?     Estimated blood loss was minimal. ?     The examined duodenum was  normal. ?Impression:            - Normal esophagus. ?                       - Gastric stenosis was found at the pylorus. Dilated. ?                       - Normal examined duodenum. ?                       - No specimens collected. ?Recommendation:        - Discharge patient to home. ?                       - Resume previous diet. ?                       - Continue present medications. ?                       - Return to GI clinic at appointment to be scheduled. ?Procedure Code(s):     --- Professional --- ?                       [id number], Esophagogastroduodenoscopy, flexible,  ?                       transoral; with dilation of gastric/duodenal  ?                       stricture(s) (eg, balloon, bougie) ?Diagnosis Code(s):     --- Professional --- ?                       K31.1, Adult hypertrophic pyloric stenosis ?CPT copyright 2019 [employer]. All rights reserved. ?The codes documented in this report are preliminary and upon coder review may  ?be revised to meet current compliance requirements. ?Andrey Farmer MD, MD ?05/02/2021 8:21:38 AM ?Number of Addenda: 0 ?Note Initiated On: 05/02/2021 7:25 AM ?Estimated Blood Loss:  Estimated blood loss was minimal. ?     [facility] ?

## 2021-05-02 NOTE — Anesthesia Preprocedure Evaluation (Addendum)
Anesthesia Evaluation  ?Patient identified by MRN, date of birth, ID band ?Patient awake ? ? ? ?Reviewed: ?Allergy & Precautions, H&P , NPO status , Patient's Chart, lab work & pertinent test results, reviewed documented beta blocker date and time  ? ?Airway ?Mallampati: II ? ? ?Neck ROM: full ? ? ? Dental ? ?(+) Poor Dentition ?  ?Pulmonary ?asthma ,  ?  ?Pulmonary exam normal ? ? ? ? ? ? ? Cardiovascular ?Exercise Tolerance: Good ?hypertension, Pt. on medications and On Medications ?Normal cardiovascular exam ?Rhythm:regular Rate:Normal ? ? ?  ?Neuro/Psych ?PSYCHIATRIC DISORDERS Anxiety Depression negative neurological ROS ?   ? GI/Hepatic ?Neg liver ROS, GERD  Medicated,  ?Endo/Other  ?negative endocrine ROS ? Renal/GU ?negative Renal ROS  ?negative genitourinary ?  ?Musculoskeletal ? ? Abdominal ?  ?Peds ? Hematology ?negative hematology ROS ?(+)   ?Anesthesia Other Findings ?Past Medical History: ?No date: Anxiety ?No date: Asthma ?    Comment:  NO INHALER-SMOKE AND PERFUME TRIGGERS ASTHMA ?No date: Depression ?No date: Hyperlipidemia ?No date: Hypertension ?No date: IBS (irritable bowel syndrome) ?Past Surgical History: ?age 66: ABDOMINAL HYSTERECTOMY ?08/03/2014: AXILLARY LYMPH NODE BIOPSY; Right ?    Comment:  Procedure: EXCISION RIGHT  AXILLARY LIPOMATOSIS;   ?             Surgeon: Nadeen Landau, MD;  Location: [facility] ORS;   ?             Service: General;  Laterality: Right; ?10/07/2018: BREAST BIOPSY; Left ?    Comment:  Stereo, Coil Clip, BENIGN BREAST TISSUE WITH AREAS  ?             CONTAINING A PREDOMINANCE OF ADIPOSE TISSUE ?10/21/2019: BREAST BIOPSY; Left ?    Comment:  stereo, ribbon clip, path pending  ?No date: COLONOSCOPY ?01/26/2021: ESOPHAGOGASTRODUODENOSCOPY (EGD) WITH PROPOFOL; N/A ?    Comment:  Procedure: ESOPHAGOGASTRODUODENOSCOPY (EGD) WITH  ?             PROPOFOL;  Surgeon: Regis Bill, MD;  Location:  ?             [facility] ENDOSCOPY;  Service:  Endoscopy;  Laterality: N/A; ?No date: EXPLORATORY LAPAROTOMY ?No date: FOOT SURGERY ?12/10/2014: KYPHOPLASTY; N/A ?    Comment:  Procedure: KYPHOPLASTY L 1;  Surgeon: Kennedy Bucker, MD;  ?             Location: [facility] ORS;  Service: Orthopedics;  Laterality:  ?             N/A; ?No date: OOPHORECTOMY ?No date: TONSILLECTOMY ?BMI   ? Body Mass Index: 19.35 kg/m?  ?  ? Reproductive/Obstetrics ?negative OB ROS ? ?  ? ? ? ? ? ? ? ? ? ? ? ? ? ?  ?  ? ? ? ? ? ? ? ?Anesthesia Physical ? ?Anesthesia Plan ? ?ASA: 2 ? ?Anesthesia Plan: General  ? ?Post-op Pain Management:   ? ?Induction: Intravenous and Rapid sequence ? ?PONV Risk Score and Plan:  ? ?Airway Management Planned: Oral ETT ? ?Additional Equipment:  ? ?Intra-op Plan:  ? ?Post-operative Plan: Extubation in OR ? ?Informed Consent: I have reviewed the patients History and Physical, chart, labs and discussed the procedure including the risks, benefits and alternatives for the proposed anesthesia with the patient or authorized representative who has indicated his/her understanding and acceptance.  ? ? ? ?Dental Advisory Given ? ?Plan Discussed with: CRNA ? ?Anesthesia Plan Comments:   ? ? ? ? ? ?  Anesthesia Quick Evaluation ? ?

## 2021-05-02 NOTE — Anesthesia Postprocedure Evaluation (Signed)
Anesthesia Post Note ? ?Patient: Angela Espinoza ? ?Procedure(s) Performed: ESOPHAGOGASTRODUODENOSCOPY (EGD) WITH PROPOFOL ? ?Patient location during evaluation: Endoscopy ?Anesthesia Type: General ?Level of consciousness: awake and alert ?Pain management: pain level controlled ?Vital Signs Assessment: post-procedure vital signs reviewed and stable ?Respiratory status: spontaneous breathing, nonlabored ventilation and respiratory function stable ?Cardiovascular status: blood pressure returned to baseline and stable ?Postop Assessment: no apparent nausea or vomiting ?Anesthetic complications: no ? ? ?No notable events documented. ? ? ?Last Vitals:  ?Vitals:  ? 05/02/21 0834 05/02/21 0844  ?BP: 115/77 118/73  ?Pulse: 88 83  ?Resp: 16 (!) 21  ?Temp:    ?SpO2: 99% 98%  ?  ?Last Pain:  ?Vitals:  ? 05/02/21 0844  ?TempSrc:   ?PainSc: 0-No pain  ? ? ?  ?  ?  ?  ?  ?  ? ?Foye Deer ? ? ? ? ?

## 2021-05-02 NOTE — Transfer of Care (Signed)
Immediate Anesthesia Transfer of Care Note ? ?Patient: Angela Espinoza ? ?Procedure(s) Performed: ESOPHAGOGASTRODUODENOSCOPY (EGD) WITH PROPOFOL ? ?Patient Location: PACU ? ?Anesthesia Type:General ? ?Level of Consciousness: drowsy ? ?Airway & Oxygen Therapy: Patient Spontanous Breathing and Patient connected to face mask oxygen ? ?Post-op Assessment: Report given to RN and Post -op Vital signs reviewed and stable ? ?Post vital signs: Reviewed and stable ? ?Last Vitals:  ?Vitals Value Taken Time  ?BP 103/52 05/02/21 0824  ?Temp 36 ?C 05/02/21 0824  ?Pulse 82 05/02/21 0824  ?Resp 20 05/02/21 0824  ?SpO2 98 % 05/02/21 0824  ? ? ?Last Pain:  ?Vitals:  ? 05/02/21 0824  ?TempSrc: Temporal  ?PainSc: 0-No pain  ?   ? ?  ? ?Complications: No notable events documented. ?

## 2021-05-02 NOTE — H&P (Signed)
Outpatient short stay form Pre-procedure ?05/02/2021  ?Lesly Rubenstein, MD ? ?Primary Physician: Bo Merino, FNP ? ?Reason for visit:  Gastric outlet obstruction ? ?History of present illness:   ? 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 No blood thinners. No family of GI malignancies. No significant abdominal surgeries. ? ? ? ?Current Facility-Administered Medications:  ?  0.9 %  sodium chloride infusion, , Intravenous, Continuous, Mykale Gandolfo, Hilton Cork, MD, Last Rate: 20 mL/hr at 05/02/21 0716, New Bag at 05/02/21 0716 ? ?Medications Prior to Admission  ?Medication Sig Dispense Refill Last Dose  ? Biotin 1000 MCG CHEW Chew 1,000 mcg by mouth daily.    05/01/2021  ? BLACK ELDERBERRY PO Take 1 capsule by mouth daily.    05/01/2021  ? Black Pepper-Turmeric 3-500 MG CAPS Take by mouth.   05/01/2021  ? metoprolol tartrate (LOPRESSOR) 25 MG tablet Take 0.5 tablets (12.5 mg total) by mouth 2 (two) times daily. 30 tablet 1 05/02/2021  ? Multiple Vitamin (MULTIVITAMIN) capsule Take 1 capsule by mouth daily.   05/01/2021  ? Omega-3 Fatty Acids (FISH OIL PO) Take 1 tablet by mouth daily.   05/01/2021  ? pantoprazole (PROTONIX) 40 MG tablet Take 1 tablet (40 mg total) by mouth 2 (two) times daily. 60 tablet 1 05/01/2021  ? SENNA PLUS 8.6-50 MG tablet Take 1 tablet by mouth daily.   05/01/2021  ? vitamin C (ASCORBIC ACID) 500 MG tablet Take 1,000 mg by mouth daily.    05/01/2021  ? Calcium Carbonate-Vitamin D (CALCIUM 500 + D PO) Take 1 tablet by mouth daily. (Patient not taking: Reported on 05/02/2021)   Not Taking  ? DULoxetine (CYMBALTA) 30 MG capsule Take 30 mg by mouth 2 (two) times daily. (Patient not taking: Reported on 04/17/2021)   Not Taking  ? gabapentin (NEURONTIN) 300 MG capsule Take 300 mg by mouth 3 (three) times daily. (Patient not taking: Reported on 04/17/2021)   Not Taking  ? ? ? ?Allergies  ?Allergen Reactions  ? Indapamide Other (See Comments)   ?  tingling all over body  ? ? ? ?Past Medical History:  ?Diagnosis Date  ? Anxiety   ? Asthma   ? NO INHALER-SMOKE AND PERFUME TRIGGERS ASTHMA  ? Depression   ? Hyperlipidemia   ? Hypertension   ? IBS (irritable bowel syndrome)   ? ? ?Review of systems:  Otherwise negative.  ? ? ?Physical Exam ? ?Gen: Alert, oriented. Appears stated age.  ?HEENT: PERRLA. ?Lungs: No respiratory distress ?CV: RRR ?Abd: soft, benign, no masses ?Ext: No edema ? ? ? ?Planned procedures: Proceed with EGD. The patient understands the nature of the planned procedure, indications, risks, alternatives and potential complications including but not limited to bleeding, infection, perforation, damage to internal organs and possible oversedation/side effects from anesthesia. The patient agrees and gives consent to proceed.  ?Please refer to procedure notes for findings, recommendations and patient disposition/instructions.  ? ? ? ?Lesly Rubenstein, MD ?[facility] ? ? ? ?  ? ?

## 2021-05-02 NOTE — Interval H&P Note (Signed)
History and Physical Interval Note: ? ?05/02/2021 ?7:44 AM ? ?Angela Espinoza  has presented today for surgery, with the diagnosis of PUD ?Gastric outlet Obstruction.  The various methods of treatment have been discussed with the patient and family. After consideration of risks, benefits and other options for treatment, the patient has consented to  Procedure(s): ?ESOPHAGOGASTRODUODENOSCOPY (EGD) WITH PROPOFOL (N/A) as a surgical intervention.  The patient's history has been reviewed, patient examined, no change in status, stable for surgery.  I have reviewed the patient's chart and labs.  Questions were answered to the patient's satisfaction.   ? ? ?[name] Mayhan ? ?Ok to proceed with EGD ?

## 2021-05-10 ENCOUNTER — Ambulatory Visit: Payer: Medicare HMO | Admitting: Surgery

## 2021-05-18 DIAGNOSIS — K311 Adult hypertrophic pyloric stenosis: Secondary | ICD-10-CM | POA: Diagnosis not present

## 2021-06-06 ENCOUNTER — Encounter: Payer: Self-pay | Admitting: *Deleted

## 2021-06-06 ENCOUNTER — Ambulatory Visit: Payer: Medicare HMO | Admitting: Certified Registered Nurse Anesthetist

## 2021-06-06 ENCOUNTER — Ambulatory Visit
Admission: RE | Admit: 2021-06-06 | Discharge: 2021-06-06 | Disposition: A | Discharge status: Home / Self Care | Payer: Medicare HMO | Source: Ambulatory Visit | Attending: Gastroenterology | Admitting: Gastroenterology

## 2021-06-06 ENCOUNTER — Other Ambulatory Visit: Payer: Self-pay

## 2021-06-06 ENCOUNTER — Encounter
Admission: RE | Disposition: A | Discharge status: Home / Self Care | Payer: Self-pay | Source: Ambulatory Visit | Attending: Gastroenterology

## 2021-06-06 DIAGNOSIS — F419 Anxiety disorder, unspecified: Secondary | ICD-10-CM | POA: Diagnosis not present

## 2021-06-06 DIAGNOSIS — K311 Adult hypertrophic pyloric stenosis: Secondary | ICD-10-CM | POA: Diagnosis not present

## 2021-06-06 DIAGNOSIS — T182XXA Foreign body in stomach, initial encounter: Secondary | ICD-10-CM | POA: Diagnosis not present

## 2021-06-06 DIAGNOSIS — Z79899 Other long term (current) drug therapy: Secondary | ICD-10-CM | POA: Diagnosis not present

## 2021-06-06 DIAGNOSIS — I10 Essential (primary) hypertension: Secondary | ICD-10-CM | POA: Insufficient documentation

## 2021-06-06 DIAGNOSIS — K219 Gastro-esophageal reflux disease without esophagitis: Secondary | ICD-10-CM | POA: Diagnosis not present

## 2021-06-06 DIAGNOSIS — R69 Illness, unspecified: Secondary | ICD-10-CM | POA: Diagnosis not present

## 2021-06-06 DIAGNOSIS — F32A Depression, unspecified: Secondary | ICD-10-CM | POA: Insufficient documentation

## 2021-06-06 HISTORY — PX: ESOPHAGOGASTRODUODENOSCOPY (EGD) WITH PROPOFOL: SHX5813

## 2021-06-06 SURGERY — ESOPHAGOGASTRODUODENOSCOPY (EGD) WITH PROPOFOL
Anesthesia: General

## 2021-06-06 MED ORDER — PROPOFOL 500 MG/50ML IV EMUL
INTRAVENOUS | Status: AC
  Filled 2021-06-06: qty 50

## 2021-06-06 MED ORDER — SUCCINYLCHOLINE CHLORIDE 200 MG/10ML IV SOSY
PREFILLED_SYRINGE | INTRAVENOUS | Status: AC
Start: 2021-06-06 — End: ?
  Filled 2021-06-06: qty 10

## 2021-06-06 MED ORDER — ONDANSETRON HCL 4 MG/2ML IJ SOLN
INTRAMUSCULAR | Status: AC
Start: 2021-06-06 — End: ?
  Filled 2021-06-06: qty 2

## 2021-06-06 MED ORDER — PROPOFOL 10 MG/ML IV BOLUS
INTRAVENOUS | Status: DC | PRN
  Administered 2021-06-06 (×2): 100 mg via INTRAVENOUS
  Administered 2021-06-06: 30 mg via INTRAVENOUS
  Administered 2021-06-06: 40 mg via INTRAVENOUS

## 2021-06-06 MED ORDER — SUCCINYLCHOLINE CHLORIDE 200 MG/10ML IV SOSY
PREFILLED_SYRINGE | INTRAVENOUS | Status: DC | PRN
  Administered 2021-06-06: 100 mg via INTRAVENOUS

## 2021-06-06 MED ORDER — SODIUM CHLORIDE 0.9 % IV SOLN: INTRAVENOUS | Status: DC

## 2021-06-06 MED ORDER — LIDOCAINE HCL (CARDIAC) PF 100 MG/5ML IV SOSY
PREFILLED_SYRINGE | INTRAVENOUS | Status: DC | PRN
  Administered 2021-06-06: 100 mg via INTRAVENOUS

## 2021-06-06 MED ORDER — LIDOCAINE HCL (PF) 2 % IJ SOLN
INTRAMUSCULAR | Status: AC
  Filled 2021-06-06: qty 5

## 2021-06-06 MED ORDER — ONDANSETRON HCL 4 MG/2ML IJ SOLN
INTRAMUSCULAR | Status: DC | PRN
  Administered 2021-06-06: 4 mg via INTRAVENOUS

## 2021-06-06 NOTE — Anesthesia Procedure Notes (Signed)
Procedure Name: Intubation ?Date/Time: 06/06/2021 12:30 PM ?Performed by: Kerri Perches, CRNA ?Pre-anesthesia Checklist: Patient identified, Patient being monitored, Timeout performed, Emergency Drugs available and Suction available ?Patient Re-evaluated:Patient Re-evaluated prior to induction ?Oxygen Delivery Method: Circle system utilized ?Preoxygenation: Pre-oxygenation with 100% oxygen (RSI) ?Induction Type: IV induction ?Laryngoscope Size: Mac and 3 ?Grade View: Grade I ?Tube type: Oral ?Tube size: 7.0 mm ?Number of attempts: 1 ?Airway Equipment and Method: Stylet ?Placement Confirmation: ETT inserted through vocal cords under direct vision, positive ETCO2 and breath sounds checked- equal and bilateral ?Secured at: 21 cm ?Tube secured with: Tape ?Dental Injury: Teeth and Oropharynx as per pre-operative assessment  ? ? ? ? ?

## 2021-06-06 NOTE — Anesthesia Postprocedure Evaluation (Signed)
Anesthesia Post Note ? ?Patient: Angela Espinoza ? ?Procedure(s) Performed: ESOPHAGOGASTRODUODENOSCOPY (EGD) WITH PROPOFOL ? ?Patient location during evaluation: PACU ?Anesthesia Type: General ?Level of consciousness: awake and alert ?Pain management: pain level controlled ?Vital Signs Assessment: post-procedure vital signs reviewed and stable ?Respiratory status: spontaneous breathing, nonlabored ventilation, respiratory function stable and patient connected to nasal cannula oxygen ?Cardiovascular status: blood pressure returned to baseline and stable ?Postop Assessment: no apparent nausea or vomiting ?Anesthetic complications: no ? ? ?No notable events documented. ? ? ?Last Vitals:  ?Vitals:  ? 06/06/21 1143  ?BP: 109/64  ?Pulse: 84  ?Resp: 20  ?Temp: (!) 35.7 ?C  ?SpO2: 100%  ?  ?Last Pain:  ?Vitals:  ? 06/06/21 1326  ?TempSrc:   ?PainSc: 0-No pain  ? ? ?  ?  ?  ?  ?  ?  ? ?Yevette Edwards ? ? ? ? ?

## 2021-06-06 NOTE — Anesthesia Preprocedure Evaluation (Signed)
Anesthesia Evaluation  ?Patient identified by MRN, date of birth, ID band ?Patient awake ? ? ? ?Reviewed: ?Allergy & Precautions, H&P , NPO status , Patient's Chart, lab work & pertinent test results, reviewed documented beta blocker date and time  ? ?Airway ?Mallampati: II ? ? ?Neck ROM: full ? ? ? Dental ? ?(+) Poor Dentition ?  ?Pulmonary ?asthma ,  ?  ?Pulmonary exam normal ? ? ? ? ? ? ? Cardiovascular ?Exercise Tolerance: Good ?hypertension, Pt. on medications and On Medications ?negative cardio ROS ?Normal cardiovascular exam ?Rhythm:regular Rate:Normal ? ? ?  ?Neuro/Psych ?PSYCHIATRIC DISORDERS Anxiety Depression negative neurological ROS ?   ? GI/Hepatic ?negative GI ROS, Neg liver ROS, GERD  ,  ?Endo/Other  ?negative endocrine ROS ? Renal/GU ?negative Renal ROS  ?negative genitourinary ?  ?Musculoskeletal ? ?(+) Arthritis ,  ? Abdominal ?  ?Peds ? Hematology ?negative hematology ROS ?(+)   ?Anesthesia Other Findings ?Past Medical History: ?No date: Anxiety ?No date: Asthma ?    Comment:  NO INHALER-SMOKE AND PERFUME TRIGGERS ASTHMA ?No date: Depression ?No date: Hyperlipidemia ?No date: Hypertension ?No date: IBS (irritable bowel syndrome) ?Past Surgical History: ?age 6: ABDOMINAL HYSTERECTOMY ?08/03/2014: AXILLARY LYMPH NODE BIOPSY; Right ?    Comment:  Procedure: EXCISION RIGHT  AXILLARY LIPOMATOSIS;   ?             Surgeon: Leonie Green, MD;  Location: [facility] ORS;   ?             Service: General;  Laterality: Right; ?10/07/2018: BREAST BIOPSY; Left ?    Comment:  Stereo, Coil Clip, BENIGN BREAST TISSUE WITH AREAS  ?             CONTAINING A PREDOMINANCE OF ADIPOSE TISSUE ?10/21/2019: BREAST BIOPSY; Left ?    Comment:  stereo, ribbon clip, path pending  ?No date: COLONOSCOPY ?No date: DIAGNOSTIC LAPAROSCOPY ?01/26/2021: ESOPHAGOGASTRODUODENOSCOPY (EGD) WITH PROPOFOL; N/A ?    Comment:  Procedure: ESOPHAGOGASTRODUODENOSCOPY (EGD) WITH  ?             PROPOFOL;   Surgeon: Lesly Rubenstein, MD;  Location:  ?             [place] [facility];  Service: Endoscopy;  Laterality: N/A; ?04/17/2021: ESOPHAGOGASTRODUODENOSCOPY (EGD) WITH PROPOFOL; N/A ?    Comment:  Procedure: ESOPHAGOGASTRODUODENOSCOPY (EGD) WITH  ?             PROPOFOL;  Surgeon: Lesly Rubenstein, MD;  Location:  ?             [place] [facility];  Service: Endoscopy;  Laterality: N/A; ?05/02/2021: ESOPHAGOGASTRODUODENOSCOPY (EGD) WITH PROPOFOL; N/A ?    Comment:  Procedure: ESOPHAGOGASTRODUODENOSCOPY (EGD) WITH  ?             PROPOFOL;  Surgeon: Lesly Rubenstein, MD;  Location:  ?             [place] [facility];  Service: Endoscopy;  Laterality: N/A; ?No date: EXPLORATORY LAPAROTOMY ?No date: FOOT SURGERY ?12/10/2014: KYPHOPLASTY; N/A ?    Comment:  Procedure: KYPHOPLASTY L 1;  Surgeon: Hessie Knows, MD;  ?             Location: [facility] ORS;  Service: Orthopedics;  Laterality:  ?             N/A; ?No date: OOPHORECTOMY ?No date: TONSILLECTOMY ?BMI   ? Body Mass Index: 20.12 kg/m?  ?  ? Reproductive/Obstetrics ?negative OB ROS ? ?  ? ? ? ? ? ? ? ? ? ? ? ? ? ?  ?  ? ? ? ? ? ? ? ? ?  Anesthesia Physical ?Anesthesia Plan ? ?ASA: 3 ? ?Anesthesia Plan: General  ? ?Post-op Pain Management:   ? ?Induction:  ? ?PONV Risk Score and Plan:  ? ?Airway Management Planned:  ? ?Additional Equipment:  ? ?Intra-op Plan:  ? ?Post-operative Plan:  ? ?Informed Consent: I have reviewed the patients History and Physical, chart, labs and discussed the procedure including the risks, benefits and alternatives for the proposed anesthesia with the patient or authorized representative who has indicated his/her understanding and acceptance.  ? ? ? ?Dental Advisory Given ? ?Plan Discussed with: CRNA ? ?Anesthesia Plan Comments:   ? ? ? ? ? ? ?Anesthesia Quick Evaluation ? ?

## 2021-06-06 NOTE — H&P (Signed)
Outpatient short stay form Pre-procedure ?06/06/2021  ?Regis Bill, MD ? ?Primary Physician: Berniece Salines, FNP ? ?Reason for visit:  Pyloric stenosis ? ?History of present illness:   ? ?66 y/o lady with history of gastric outlet obstruction due to NSAIDS is here for balloon dilation. Last EGD got up to 15 mm. No blood thinners. No family history of GI malignancies. No abdominal surgeries. ? ? ? ?Current Facility-Administered Medications:  ?  0.9 %  sodium chloride infusion, , Intravenous, Continuous, Mitzie Marlar, Rossie Muskrat, MD, Last Rate: 20 mL/hr at 06/06/21 1150, New Bag at 06/06/21 1150 ? ?Medications Prior to Admission  ?Medication Sig Dispense Refill Last Dose  ? Biotin 1000 MCG CHEW Chew 1,000 mcg by mouth daily.    06/05/2021  ? BLACK ELDERBERRY PO Take 1 capsule by mouth daily.    06/05/2021  ? Black Pepper-Turmeric 3-500 MG CAPS Take by mouth.   06/05/2021  ? metoprolol tartrate (LOPRESSOR) 25 MG tablet Take 0.5 tablets (12.5 mg total) by mouth 2 (two) times daily. 30 tablet 1 06/06/2021 at 0500  ? Multiple Vitamin (MULTIVITAMIN) capsule Take 1 capsule by mouth daily.   06/05/2021  ? Omega-3 Fatty Acids (FISH OIL PO) Take 1 tablet by mouth daily.   06/05/2021  ? pantoprazole (PROTONIX) 40 MG tablet Take 1 tablet (40 mg total) by mouth 2 (two) times daily. 60 tablet 1 06/05/2021  ? vitamin C (ASCORBIC ACID) 500 MG tablet Take 1,000 mg by mouth daily.    06/05/2021  ? Calcium Carbonate-Vitamin D (CALCIUM 500 + D PO) Take 1 tablet by mouth daily. (Patient not taking: Reported on 05/02/2021)     ? DULoxetine (CYMBALTA) 30 MG capsule Take 30 mg by mouth 2 (two) times daily. (Patient not taking: Reported on 04/17/2021)     ? gabapentin (NEURONTIN) 300 MG capsule Take 300 mg by mouth 3 (three) times daily. (Patient not taking: Reported on 04/17/2021)     ? SENNA PLUS 8.6-50 MG tablet Take 1 tablet by mouth daily.     ? ? ? ?Allergies  ?Allergen Reactions  ? Indapamide Other (See Comments)  ?  tingling all over body  ? ? ? ?Past  Medical History:  ?Diagnosis Date  ? Anxiety   ? Asthma   ? NO INHALER-SMOKE AND PERFUME TRIGGERS ASTHMA  ? Depression   ? Hyperlipidemia   ? Hypertension   ? IBS (irritable bowel syndrome)   ? ? ?Review of systems:  Otherwise negative.  ? ? ?Physical Exam ? ?Gen: Alert, oriented. Appears stated age.  ?HEENT: PERRLA. ?Lungs: No respiratory distress ?CV: RRR ?Abd: soft, benign, no masses ?Ext: No edema ? ? ? ?Planned procedures: Proceed with EGD. The patient understands the nature of the planned procedure, indications, risks, alternatives and potential complications including but not limited to bleeding, infection, perforation, damage to internal organs and possible oversedation/side effects from anesthesia. The patient agrees and gives consent to proceed.  ?Please refer to procedure notes for findings, recommendations and patient disposition/instructions.  ? ? ? ?Regis Bill, MD ?[facility] ? ? ? ?  ? ?

## 2021-06-06 NOTE — Interval H&P Note (Signed)
History and Physical Interval Note: ? ?06/06/2021 ?12:07 PM ? ?Angela Espinoza  has presented today for surgery, with the diagnosis of Pyloric Stenosis.  The various methods of treatment have been discussed with the patient and family. After consideration of risks, benefits and other options for treatment, the patient has consented to  Procedure(s): ?ESOPHAGOGASTRODUODENOSCOPY (EGD) WITH PROPOFOL (N/A) as a surgical intervention.  The patient's history has been reviewed, patient examined, no change in status, stable for surgery.  I have reviewed the patient's chart and labs.  Questions were answered to the patient's satisfaction.   ? ? ?[name] ? ?Ok to proceed with EGD ?

## 2021-06-06 NOTE — Transfer of Care (Signed)
Immediate Anesthesia Transfer of Care Note ? ?Patient: Angela Espinoza ? ?Procedure(s) Performed: ESOPHAGOGASTRODUODENOSCOPY (EGD) WITH PROPOFOL ? ?Patient Location: PACU and Endoscopy Unit ? ?Anesthesia Type:General ? ?Level of Consciousness: awake ? ?Airway & Oxygen Therapy: RA ? ?Post-op Assessment: Post -op Vital signs reviewed and stable ? ?Post vital signs: stable ? ?Last Vitals:  ?Vitals Value Taken Time  ?BP 131/68 06/06/21 1300  ?Temp    ?Pulse 77 06/06/21 1302  ?Resp 16 06/06/21 1302  ?SpO2 100 % 06/06/21 1302  ?Vitals shown include unvalidated device data. ? ?Last Pain:  ?Vitals:  ? 06/06/21 1259  ?TempSrc:   ?PainSc: 0-No pain  ?   ? ?  ? ?Complications: No notable events documented. ?

## 2021-06-06 NOTE — Op Note (Signed)
[facility] ?Gastroenterology ?Patient Name: Angela Espinoza ?Procedure Date: 06/06/2021 12:18 PM ?MRN: [id number] ?Account #: [id number] ?Date of Birth: 03-20-1955 ?Admit Type: Outpatient ?Age: 66 ?Room: [facility] ENDO ROOM 1 ?Gender: Female ?Note Status: Finalized ?Instrument Name: Upper Endoscope [id number] ?Procedure:             Upper GI endoscopy ?Indications:           Pyloric stenosis ?Providers:             Andrey Farmer MD, MD ?Referring MD:          Forest Gleason Md, MD (Referring MD) ?Medicines:             Monitored Anesthesia Care ?Complications:         No immediate complications. Estimated blood loss:  ?                       Minimal. ?Procedure:             Pre-Anesthesia Assessment: ?                       - Prior to the procedure, a History and Physical was  ?                       performed, and patient medications and allergies were  ?                       reviewed. The patient is competent. The risks and  ?                       benefits of the procedure and the sedation options and  ?                       risks were discussed with the patient. All questions  ?                       were answered and informed consent was obtained.  ?                       Patient identification and proposed procedure were  ?                       verified by the physician, the nurse, the  ?                       anesthesiologist, the anesthetist and the technician  ?                       in the endoscopy suite. Mental Status Examination:  ?                       alert and oriented. Airway Examination: normal  ?                       oropharyngeal airway and neck mobility. Respiratory  ?                       Examination: clear to auscultation. CV Examination:  ?  normal. Prophylactic Antibiotics: The patient does not  ?                       require prophylactic antibiotics. Prior  ?                       Anticoagulants: The patient has taken no previous  ?                        anticoagulant or antiplatelet agents. ASA Grade  ?                       Assessment: II - A patient with mild systemic disease.  ?                       After reviewing the risks and benefits, the patient  ?                       was deemed in satisfactory condition to undergo the  ?                       procedure. The anesthesia plan was to use monitored  ?                       anesthesia care (MAC). Immediately prior to  ?                       administration of medications, the patient was  ?                       re-assessed for adequacy to receive sedatives. The  ?                       heart rate, respiratory rate, oxygen saturations,  ?                       blood pressure, adequacy of pulmonary ventilation, and  ?                       response to care were monitored throughout the  ?                       procedure. The physical status of the patient was  ?                       re-assessed after the procedure. ?                       After obtaining informed consent, the endoscope was  ?                       passed under direct vision. Throughout the procedure,  ?                       the patient's blood pressure, pulse, and oxygen  ?                       saturations were monitored continuously. The Endoscope  ?  was introduced through the mouth, and advanced to the  ?                       second part of duodenum. The upper GI endoscopy was  ?                       somewhat difficult due to narrowing. The patient  ?                       tolerated the procedure well. ?Findings: ?     The examined esophagus was normal. ?     A medium amount of food (residue) was found in the gastric body. This  ?     required patient to be intubated. ?     A deformity was found at the pylorus. A TTS dilator was passed through  ?     the scope. Dilation with a 16.5 mm pyloric balloon dilator was  ?     performed. The dilation site was examined and showed mild mucosal  ?     disruption. Estimated  blood loss was minimal. Compared to before, was  ?     able to pass the stenosis without dilation. ?     The examined duodenum was normal. ?Impression:            - Normal esophagus. ?                       - A medium amount of food (residue) in the stomach. ?                       - Acquired deformity in the pylorus. Dilated. ?                       - Normal examined duodenum. ?                       - No specimens collected. ?Recommendation:        - Discharge patient to home. ?                       - Resume previous diet. ?                       - Continue present medications. ?                       - Return to referring physician as previously  ?                       scheduled. ?Procedure Code(s):     --- Professional --- ?                       [phone], Esophagogastroduodenoscopy, flexible,  ?                       transoral; with dilation of gastric/duodenal  ?                       stricture(s) (eg, balloon, bougie) ?Diagnosis Code(s):     --- Professional --- ?  K31.89, Other diseases of stomach and duodenum ?                       K31.1, Adult hypertrophic pyloric stenosis ?CPT copyright 2019 [employer]. All rights reserved. ?The codes documented in this report are preliminary and upon coder review may  ?be revised to meet current compliance requirements. ?Andrey Farmer MD, MD ?06/06/2021 12:56:37 PM ?Number of Addenda: 0 ?Note Initiated On: 06/06/2021 12:18 PM ?Estimated Blood Loss:  Estimated blood loss was minimal. ?     [facility] ?

## 2021-06-07 ENCOUNTER — Encounter: Payer: Self-pay | Admitting: Gastroenterology

## 2021-06-15 ENCOUNTER — Other Ambulatory Visit: Payer: Self-pay

## 2021-06-15 ENCOUNTER — Ambulatory Visit (INDEPENDENT_AMBULATORY_CARE_PROVIDER_SITE_OTHER): Payer: Medicare HMO | Admitting: Nurse Practitioner

## 2021-06-15 ENCOUNTER — Encounter: Payer: Self-pay | Admitting: Nurse Practitioner

## 2021-06-15 VITALS — BP 122/82 | HR 77 | Temp 98.2°F | Resp 16 | Ht 62.0 in | Wt 113.7 lb

## 2021-06-15 DIAGNOSIS — Z8639 Personal history of other endocrine, nutritional and metabolic disease: Secondary | ICD-10-CM | POA: Diagnosis not present

## 2021-06-15 DIAGNOSIS — I10 Essential (primary) hypertension: Secondary | ICD-10-CM | POA: Diagnosis not present

## 2021-06-15 DIAGNOSIS — Z131 Encounter for screening for diabetes mellitus: Secondary | ICD-10-CM

## 2021-06-15 DIAGNOSIS — Z8719 Personal history of other diseases of the digestive system: Secondary | ICD-10-CM | POA: Diagnosis not present

## 2021-06-15 DIAGNOSIS — J301 Allergic rhinitis due to pollen: Secondary | ICD-10-CM | POA: Insufficient documentation

## 2021-06-15 DIAGNOSIS — Z7689 Persons encountering health services in other specified circumstances: Secondary | ICD-10-CM

## 2021-06-15 DIAGNOSIS — K311 Adult hypertrophic pyloric stenosis: Secondary | ICD-10-CM

## 2021-06-15 DIAGNOSIS — K269 Duodenal ulcer, unspecified as acute or chronic, without hemorrhage or perforation: Secondary | ICD-10-CM

## 2021-06-15 DIAGNOSIS — Z13220 Encounter for screening for lipoid disorders: Secondary | ICD-10-CM

## 2021-06-15 DIAGNOSIS — J4520 Mild intermittent asthma, uncomplicated: Secondary | ICD-10-CM | POA: Diagnosis not present

## 2021-06-15 MED ORDER — METOPROLOL TARTRATE 25 MG PO TABS: 12.5000 mg | ORAL_TABLET | Freq: Two times a day (BID) | ORAL | 1 refills | Status: DC

## 2021-06-15 MED ORDER — LEVOCETIRIZINE DIHYDROCHLORIDE 5 MG PO TABS: 5.0000 mg | ORAL_TABLET | Freq: Every evening | ORAL | 1 refills | Status: DC

## 2021-06-15 NOTE — Assessment & Plan Note (Signed)
She is currently under the care of GI Dr. Locklear.  Continue with current treatment plan. 

## 2021-06-15 NOTE — Assessment & Plan Note (Signed)
Blood pressure at goal today 122/82.  Continue taking metoprolol 12.5 mg twice daily.

## 2021-06-15 NOTE — Assessment & Plan Note (Signed)
She is currently under the care of GI Dr. Haig Prophet.  Continue with current treatment plan.

## 2021-06-15 NOTE — Assessment & Plan Note (Signed)
She says her asthma is reactive to smoke and strong cologne.  She has had no issues with her asthma recently.  She does have an inhaler available if needed.

## 2021-06-15 NOTE — Progress Notes (Signed)
BP 122/82   Pulse 77   Temp 98.2 F (36.8 C) (Oral)   Resp 16   Ht 5\' 2"  (1.575 m)   Wt 113 lb 11.2 oz (51.6 kg)   SpO2 98%   BMI 20.80 kg/m    Subjective:    Patient ID: Angela Espinoza, female    DOB: 02-01-1955, 66 y.o.   MRN: [id number]  HPI: Angela Espinoza is a 66 y.o. female  Chief Complaint  Patient presents with   Establish Care   Establish care: She says she has not had a physical in a long time.  She recently had a GI bleed and was admitted to the ICU.  She says she is doing much better.  Hypertension: She says her blood pressures been fine.  Today her blood pressure is 122/82.  She is currently taking metoprolol 12.5 mg twice daily.  She denies any chest pain, shortness of breath, headaches or blurred vision.  We will continue with current treatment.  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  She says her asthma is really never given her any issues.  She says typically gets when she is around someone who is smoking or there is a strong cologne.  She does have an albuterol inhaler at home.  But has not had to use in a very long time.  Duodenal ulcer/GI bleed/pyloric stenosis: Currently under the care of Dr. Mia Creek gastroenterology at [facility].  She was taking meloxicam for back pain status post a traumatic injury.  In December 2022 patient went to the emergency room in cardiac arrest.  She was diagnosed with a GI bleed.  On CT they found a large and deep ulceration at the duodenal bulb.  Last upper endoscopy was done on Jun 06, 2021.  Findings showed - Normal esophagus. - A medium amount of food (residue) in the stomach. - Acquired deformity in the pylorus. Dilated. - Normal examined duodenum. She says she is doing well.  She says she still has occasional abdominal pain but it has improved.    Seasonal allergies:She says she takes Xyzal for her allergies.  She says that works well for her.  Refill sent.      06/15/2021    9:40 AM  12/27/2020    9:05 AM 03/29/2020    9:05 AM 09/14/2019   11:18 AM 01/16/2018    9:08 AM  Depression screen PHQ 2/9  Decreased Interest 0 0 0 0 0  Down, Depressed, Hopeless 0 0 0 0 0  PHQ - 2 Score 0 0 0 0 0    Relevant past medical, surgical, family and social history reviewed and updated as indicated. Interim medical history since our last visit reviewed. Allergies and medications reviewed and updated.  Review of Systems  Constitutional: Negative for fever or weight change.  Respiratory: Negative for cough and shortness of breath.   Cardiovascular: Negative for chest pain or palpitations.  Gastrointestinal: Positive for abdominal pain, no bowel changes.  Musculoskeletal: Negative for gait problem or joint swelling.  Skin: Negative for rash.  Neurological: Negative for dizziness or headache.  No other specific complaints in a complete review of systems (except as listed in HPI above).      Objective:    BP 122/82   Pulse 77   Temp 98.2 F (36.8 C) (Oral)   Resp 16   Ht 5\' 2"  (1.575 m)   Wt 113 lb 11.2 oz (51.6 kg)   SpO2 98%  BMI 20.80 kg/m   Wt Readings from Last 3 Encounters:  06/15/21 113 lb 11.2 oz (51.6 kg)  06/06/21 110 lb (49.9 kg)  05/02/21 106 lb (48.1 kg)    Physical Exam  Constitutional: Patient appears well-developed and well-nourished.  No distress.  HEENT: head atraumatic, normocephalic, pupils equal and reactive to light,  neck supple Cardiovascular: Normal rate, regular rhythm and normal heart sounds.  No murmur heard. No BLE edema. Pulmonary/Chest: Effort normal and breath sounds normal. No respiratory distress. Abdominal: Soft.  There is no tenderness. Psychiatric: Patient has a normal mood and affect. behavior is normal. Judgment and thought content normal.  Results for orders placed or performed during the hospital encounter of 01/26/21  Culture, blood (routine x 2)   Specimen: BLOOD  Result Value Ref Range   Specimen Description BLOOD LEFT  ANTECUBITAL    Special Requests      BOTTLES DRAWN AEROBIC AND ANAEROBIC Blood Culture results may not be optimal due to an inadequate volume of blood received in culture bottles   Culture      NO GROWTH 5 DAYS Performed at [facility], [address] [place], [place] [address]    Report Status 01/31/2021 FINAL   Culture, blood (routine x 2)   Specimen: BLOOD  Result Value Ref Range   Specimen Description BLOOD BLOOD LEFT HAND    Special Requests      BOTTLES DRAWN AEROBIC AND ANAEROBIC Blood Culture results may not be optimal due to an inadequate volume of blood received in culture bottles   Culture      NO GROWTH 5 DAYS Performed at [facility], [address], [place], [place] [address]    Report Status 01/31/2021 FINAL   Urine Culture   Specimen: Urine, Clean Catch  Result Value Ref Range   Specimen Description      URINE, CLEAN CATCH Performed at [facility], [address], [place], [place] [address]    Special Requests      NONE Performed at [facility], [address], [place], [place] [address]    Culture      NO GROWTH Performed at [facility], [address] [place], [place] [address]    Report Status 01/27/2021 FINAL   Resp Panel by RT-PCR (Flu A&B, Covid) Nasopharyngeal Swab   Specimen: Nasopharyngeal Swab; Nasopharyngeal(NP) swabs in vial transport medium  Result Value Ref Range   SARS Coronavirus 2 by RT PCR NEGATIVE NEGATIVE   Influenza A by PCR NEGATIVE NEGATIVE   Influenza B by PCR NEGATIVE NEGATIVE  MRSA Next Gen by PCR, Nasal   Specimen: Nasal Mucosa; Nasal Swab  Result Value Ref Range   MRSA by PCR Next Gen NOT DETECTED NOT DETECTED  Blood gas, arterial  Result Value Ref Range   FIO2 0.80    Delivery systems VENTILATOR    Mode ASSIST CONTROL    MECHVT 400 mL   RATE 18 resp/min   PEEP 5.0 cm H20   pH, Arterial 6.91 (LL) 7.350 - 7.450   pCO2 arterial 28 (L) 32.0 - 48.0 mmHg   pO2, Arterial  454 (H) 83.0 - 108.0 mmHg   Bicarbonate 5.6 (L) 20.0 - 28.0 mmol/L   Acid-base deficit 24.5 (H) 0.0 - 2.0 mmol/L   O2 Saturation 99.9 %   Patient temperature 37.0    Collection site RIGHT RADIAL    Sample type ARTERIAL DRAW    Allens test (pass/fail) PASS PASS   Mechanical  Rate 18   CBC with Differential  Result Value Ref Range   WBC 18.6 (H) 4.0 - 10.5 K/uL   RBC 1.34 (L) 3.87 - 5.11 MIL/uL   Hemoglobin 3.8 (LL) 12.0 - 15.0 g/dL   HCT 16.1 (LL) 09.6 - 46.0 %   MCV 102.2 (H) 80.0 - 100.0 fL   MCH 28.4 26.0 - 34.0 pg   MCHC 27.7 (L) 30.0 - 36.0 g/dL   RDW 04.5 40.9 - 81.1 %   Platelets 491 (H) 150 - 400 K/uL   nRBC 0.2 0.0 - 0.2 %   Neutrophils Relative % 56 %   Neutro Abs 10.4 (H) 1.7 - 7.7 K/uL   Lymphocytes Relative 37 %   Lymphs Abs 6.8 (H) 0.7 - 4.0 K/uL   Monocytes Relative 5 %   Monocytes Absolute 0.9 0.1 - 1.0 K/uL   Eosinophils Relative 0 %   Eosinophils Absolute 0.1 0.0 - 0.5 K/uL   Basophils Relative 0 %   Basophils Absolute 0.1 0.0 - 0.1 K/uL   Immature Granulocytes 2 %   Abs Immature Granulocytes 0.41 (H) 0.00 - 0.07 K/uL  Comprehensive metabolic panel  Result Value Ref Range   Sodium 138 135 - 145 mmol/L   Potassium 4.2 3.5 - 5.1 mmol/L   Chloride 106 98 - 111 mmol/L   CO2 10 (L) 22 - 32 mmol/L   Glucose, Bld 484 (H) 70 - 99 mg/dL   BUN 31 (H) 8 - 23 mg/dL   Creatinine, Ser 9.14 (H) 0.44 - 1.00 mg/dL   Calcium 8.8 (L) 8.9 - 10.3 mg/dL   Total Protein 4.6 (L) 6.5 - 8.1 g/dL   Albumin 2.2 (L) 3.5 - 5.0 g/dL   AST 23 15 - 41 U/L   ALT 13 0 - 44 U/L   Alkaline Phosphatase 46 38 - 126 U/L   Total Bilirubin 0.5 0.3 - 1.2 mg/dL   GFR, Estimated 48 (L) >60 mL/min   Anion gap 22 (H) 5 - 15  Lactic acid, plasma  Result Value Ref Range   Lactic Acid, Venous >9.0 (HH) 0.5 - 1.9 mmol/L  Lactic acid, plasma  Result Value Ref Range   Lactic Acid, Venous 4.9 (HH) 0.5 - 1.9 mmol/L  Lipase, blood  Result Value Ref Range   Lipase 27 11 - 51 U/L  Urinalysis,  Routine w reflex microscopic Urine, Catheterized  Result Value Ref Range   Color, Urine YELLOW (A) YELLOW   APPearance HAZY (A) CLEAR   Specific Gravity, Urine >1.046 (H) 1.005 - 1.030   pH 5.0 5.0 - 8.0   Glucose, UA 50 (A) NEGATIVE mg/dL   Hgb urine dipstick MODERATE (A) NEGATIVE   Bilirubin Urine NEGATIVE NEGATIVE   Ketones, ur NEGATIVE NEGATIVE mg/dL   Protein, ur 30 (A) NEGATIVE mg/dL   Nitrite NEGATIVE NEGATIVE   Leukocytes,Ua SMALL (A) NEGATIVE   RBC / HPF 6-10 0 - 5 RBC/hpf   WBC, UA 21-50 0 - 5 WBC/hpf   Bacteria, UA RARE (A) NONE SEEN   Squamous Epithelial / LPF 0-5 0 - 5   Mucus PRESENT   Procalcitonin - Baseline  Result Value Ref Range   Procalcitonin <0.10 ng/mL  Protime-INR  Result Value Ref Range   Prothrombin Time 20.7 (H) 11.4 - 15.2 seconds   INR 1.8 (H) 0.8 - 1.2  Hemoglobin and hematocrit, blood  Result Value Ref Range   Hemoglobin 3.0 (LL) 12.0 - 15.0 g/dL   HCT 78.2 (LL) 95.6 - 46.0 %  HIV Antibody (routine testing w rflx)  Result Value Ref Range   HIV Screen 4th Generation wRfx Non Reactive Non Reactive  Folate  Result Value Ref Range   Folate 62.0 >5.9 ng/mL  Iron and TIBC  Result Value Ref Range   Iron 75 28 - 170 ug/dL   TIBC 335 (L) 456 - 256 ug/dL   Saturation Ratios 35 (H) 10.4 - 31.8 %   UIBC 142 ug/dL  Ferritin  Result Value Ref Range   Ferritin 75 11 - 307 ng/mL  Reticulocytes  Result Value Ref Range   Retic Ct Pct 4.9 (H) 0.4 - 3.1 %   RBC. 2.12 (L) 3.87 - 5.11 MIL/uL   Retic Count, Absolute 103.0 19.0 - 186.0 K/uL   Immature Retic Fract 27.6 (H) 2.3 - 15.9 %  Magnesium  Result Value Ref Range   Magnesium 2.1 1.7 - 2.4 mg/dL  Phosphorus  Result Value Ref Range   Phosphorus 4.2 2.5 - 4.6 mg/dL  Glucose, capillary  Result Value Ref Range   Glucose-Capillary 176 (H) 70 - 99 mg/dL  Glucose, capillary  Result Value Ref Range   Glucose-Capillary 214 (H) 70 - 99 mg/dL  CBC  Result Value Ref Range   WBC 17.0 (H) 4.0 - 10.5 K/uL    RBC 2.75 (L) 3.87 - 5.11 MIL/uL   Hemoglobin 8.0 (L) 12.0 - 15.0 g/dL   HCT 38.9 (L) 37.3 - 42.8 %   MCV 82.5 80.0 - 100.0 fL   MCH 29.1 26.0 - 34.0 pg   MCHC 35.2 30.0 - 36.0 g/dL   RDW 76.8 11.5 - 72.6 %   Platelets 247 150 - 400 K/uL   nRBC 0.1 0.0 - 0.2 %  Hemoglobin and hematocrit, blood  Result Value Ref Range   Hemoglobin 7.8 (L) 12.0 - 15.0 g/dL   HCT 20.3 (L) 55.9 - 74.1 %  Blood gas, arterial  Result Value Ref Range   FIO2 0.28    Delivery systems VENTILATOR    Mode PRESSURE REGULATED VOLUME CONTROL    MECHVT 400 mL   RATE 18 resp/min   PEEP 5.0 cm H20   pH, Arterial 7.51 (H) 7.350 - 7.450   pCO2 arterial 40 32.0 - 48.0 mmHg   pO2, Arterial 141 (H) 83.0 - 108.0 mmHg   Bicarbonate 31.9 (H) 20.0 - 28.0 mmol/L   Acid-Base Excess 8.2 (H) 0.0 - 2.0 mmol/L   O2 Saturation 99.4 %   Patient temperature 37.0    Collection site RIGHT RADIAL    Sample type ARTERIAL DRAW    Allens test (pass/fail) PASS PASS  Lactic acid, plasma  Result Value Ref Range   Lactic Acid, Venous 1.4 0.5 - 1.9 mmol/L  Lactic acid, plasma  Result Value Ref Range   Lactic Acid, Venous 0.9 0.5 - 1.9 mmol/L  Glucose, capillary  Result Value Ref Range   Glucose-Capillary 45 (L) 70 - 99 mg/dL  Triglycerides  Result Value Ref Range   Triglycerides 80 <150 mg/dL  Magnesium  Result Value Ref Range   Magnesium 2.0 1.7 - 2.4 mg/dL  Phosphorus  Result Value Ref Range   Phosphorus 2.8 2.5 - 4.6 mg/dL  Basic metabolic panel  Result Value Ref Range   Sodium 137 135 - 145 mmol/L   Potassium 2.8 (L) 3.5 - 5.1 mmol/L   Chloride 104 98 - 111 mmol/L   CO2 29 22 - 32 mmol/L   Glucose, Bld 103 (H) 70 - 99 mg/dL   BUN  24 (H) 8 - 23 mg/dL   Creatinine, Ser 9.60 0.44 - 1.00 mg/dL   Calcium 7.5 (L) 8.9 - 10.3 mg/dL   GFR, Estimated >45 >40 mL/min   Anion gap 4 (L) 5 - 15  Hemoglobin and hematocrit, blood  Result Value Ref Range   Hemoglobin 8.8 (L) 12.0 - 15.0 g/dL   HCT 98.1 (L) 19.1 - 47.8 %   Glucose, capillary  Result Value Ref Range   Glucose-Capillary 282 (H) 70 - 99 mg/dL  Glucose, capillary  Result Value Ref Range   Glucose-Capillary 80 70 - 99 mg/dL  Glucose, capillary  Result Value Ref Range   Glucose-Capillary 89 70 - 99 mg/dL  Hemoglobin and hematocrit, blood  Result Value Ref Range   Hemoglobin 9.1 (L) 12.0 - 15.0 g/dL   HCT 29.5 (L) 62.1 - 30.8 %  Hemoglobin and hematocrit, blood  Result Value Ref Range   Hemoglobin 8.5 (L) 12.0 - 15.0 g/dL   HCT 65.7 (L) 84.6 - 96.2 %  Hemoglobin and hematocrit, blood  Result Value Ref Range   Hemoglobin 8.3 (L) 12.0 - 15.0 g/dL   HCT 95.2 (L) 84.1 - 32.4 %  CBC with Differential/Platelet  Result Value Ref Range   WBC 11.3 (H) 4.0 - 10.5 K/uL   RBC 3.06 (L) 3.87 - 5.11 MIL/uL   Hemoglobin 8.7 (L) 12.0 - 15.0 g/dL   HCT 40.1 (L) 02.7 - 25.3 %   MCV 80.4 80.0 - 100.0 fL   MCH 28.4 26.0 - 34.0 pg   MCHC 35.4 30.0 - 36.0 g/dL   RDW 66.4 (H) 40.3 - 47.4 %   Platelets 210 150 - 400 K/uL   nRBC 0.3 (H) 0.0 - 0.2 %   Neutrophils Relative % 73 %   Neutro Abs 8.4 (H) 1.7 - 7.7 K/uL   Lymphocytes Relative 19 %   Lymphs Abs 2.1 0.7 - 4.0 K/uL   Monocytes Relative 5 %   Monocytes Absolute 0.6 0.1 - 1.0 K/uL   Eosinophils Relative 1 %   Eosinophils Absolute 0.1 0.0 - 0.5 K/uL   Basophils Relative 1 %   Basophils Absolute 0.1 0.0 - 0.1 K/uL   Immature Granulocytes 1 %   Abs Immature Granulocytes 0.06 0.00 - 0.07 K/uL  Glucose, capillary  Result Value Ref Range   Glucose-Capillary 102 (H) 70 - 99 mg/dL  Glucose, capillary  Result Value Ref Range   Glucose-Capillary 91 70 - 99 mg/dL  Glucose, capillary  Result Value Ref Range   Glucose-Capillary 80 70 - 99 mg/dL  Glucose, capillary  Result Value Ref Range   Glucose-Capillary 81 70 - 99 mg/dL  Basic metabolic panel  Result Value Ref Range   Sodium 135 135 - 145 mmol/L   Potassium 3.7 3.5 - 5.1 mmol/L   Chloride 105 98 - 111 mmol/L   CO2 27 22 - 32 mmol/L    Glucose, Bld 98 70 - 99 mg/dL   BUN 19 8 - 23 mg/dL   Creatinine, Ser 2.59 0.44 - 1.00 mg/dL   Calcium 8.1 (L) 8.9 - 10.3 mg/dL   GFR, Estimated >56 >38 mL/min   Anion gap 3 (L) 5 - 15  Glucose, capillary  Result Value Ref Range   Glucose-Capillary 73 70 - 99 mg/dL   Comment 1 Notify RN    Comment 2 Document in Chart   Hemoglobin and hematocrit, blood  Result Value Ref Range   Hemoglobin 8.8 (L) 12.0 - 15.0 g/dL  HCT 26.1 (L) 36.0 - 46.0 %  Magnesium  Result Value Ref Range   Magnesium 2.0 1.7 - 2.4 mg/dL  Phosphorus  Result Value Ref Range   Phosphorus 3.4 2.5 - 4.6 mg/dL  CBC  Result Value Ref Range   WBC 9.9 4.0 - 10.5 K/uL   RBC 2.95 (L) 3.87 - 5.11 MIL/uL   Hemoglobin 8.5 (L) 12.0 - 15.0 g/dL   HCT 27.7 (L) 41.2 - 87.8 %   MCV 85.4 80.0 - 100.0 fL   MCH 28.8 26.0 - 34.0 pg   MCHC 33.7 30.0 - 36.0 g/dL   RDW 67.6 (H) 72.0 - 94.7 %   Platelets 167 150 - 400 K/uL   nRBC 0.0 0.0 - 0.2 %  Potassium  Result Value Ref Range   Potassium 3.8 3.5 - 5.1 mmol/L  Hemoglobin and hematocrit, blood  Result Value Ref Range   Hemoglobin 8.4 (L) 12.0 - 15.0 g/dL   HCT 09.6 (L) 28.3 - 66.2 %  Hemoglobin and hematocrit, blood  Result Value Ref Range   Hemoglobin 8.3 (L) 12.0 - 15.0 g/dL   HCT 94.7 (L) 65.4 - 65.0 %  Glucose, capillary  Result Value Ref Range   Glucose-Capillary 74 70 - 99 mg/dL  Glucose, capillary  Result Value Ref Range   Glucose-Capillary 80 70 - 99 mg/dL  Glucose, capillary  Result Value Ref Range   Glucose-Capillary 77 70 - 99 mg/dL  Glucose, capillary  Result Value Ref Range   Glucose-Capillary 90 70 - 99 mg/dL  Vitamin P54  Result Value Ref Range   Vitamin B-12 650 180 - 914 pg/mL  Hemoglobin and hematocrit, blood  Result Value Ref Range   Hemoglobin 9.0 (L) 12.0 - 15.0 g/dL   HCT 65.6 (L) 81.2 - 75.1 %  Glucose, capillary  Result Value Ref Range   Glucose-Capillary 80 70 - 99 mg/dL  Basic metabolic panel  Result Value Ref Range   Sodium 137  135 - 145 mmol/L   Potassium 3.7 3.5 - 5.1 mmol/L   Chloride 108 98 - 111 mmol/L   CO2 24 22 - 32 mmol/L   Glucose, Bld 104 (H) 70 - 99 mg/dL   BUN 16 8 - 23 mg/dL   Creatinine, Ser 7.00 0.44 - 1.00 mg/dL   Calcium 8.1 (L) 8.9 - 10.3 mg/dL   GFR, Estimated >17 >49 mL/min   Anion gap 5 5 - 15  Glucose, capillary  Result Value Ref Range   Glucose-Capillary 101 (H) 70 - 99 mg/dL  Glucose, capillary  Result Value Ref Range   Glucose-Capillary 90 70 - 99 mg/dL  Hemoglobin and hematocrit, blood  Result Value Ref Range   Hemoglobin 8.9 (L) 12.0 - 15.0 g/dL   HCT 44.9 (L) 67.5 - 91.6 %  Hemoglobin and hematocrit, blood  Result Value Ref Range   Hemoglobin 8.5 (L) 12.0 - 15.0 g/dL   HCT 38.4 (L) 66.5 - 99.3 %  Magnesium  Result Value Ref Range   Magnesium 1.8 1.7 - 2.4 mg/dL  Phosphorus  Result Value Ref Range   Phosphorus 3.5 2.5 - 4.6 mg/dL  Glucose, capillary  Result Value Ref Range   Glucose-Capillary 109 (H) 70 - 99 mg/dL  CBC  Result Value Ref Range   WBC 11.6 (H) 4.0 - 10.5 K/uL   RBC 3.17 (L) 3.87 - 5.11 MIL/uL   Hemoglobin 9.1 (L) 12.0 - 15.0 g/dL   HCT 57.0 (L) 17.7 - 93.9 %   MCV 84.9  80.0 - 100.0 fL   MCH 28.7 26.0 - 34.0 pg   MCHC 33.8 30.0 - 36.0 g/dL   RDW 16.1 09.6 - 04.5 %   Platelets 188 150 - 400 K/uL   nRBC 0.0 0.0 - 0.2 %  Procalcitonin - Baseline  Result Value Ref Range   Procalcitonin <0.10 ng/mL  Glucose, capillary  Result Value Ref Range   Glucose-Capillary 90 70 - 99 mg/dL  Hemoglobin and hematocrit, blood  Result Value Ref Range   Hemoglobin 8.8 (L) 12.0 - 15.0 g/dL   HCT 40.9 (L) 81.1 - 91.4 %  Glucose, capillary  Result Value Ref Range   Glucose-Capillary 101 (H) 70 - 99 mg/dL  Glucose, capillary  Result Value Ref Range   Glucose-Capillary 113 (H) 70 - 99 mg/dL  Triglycerides  Result Value Ref Range   Triglycerides 86 <150 mg/dL  Magnesium  Result Value Ref Range   Magnesium 2.0 1.7 - 2.4 mg/dL  Phosphorus  Result Value Ref Range    Phosphorus 2.9 2.5 - 4.6 mg/dL  Procalcitonin  Result Value Ref Range   Procalcitonin <0.10 ng/mL  Glucose, capillary  Result Value Ref Range   Glucose-Capillary 101 (H) 70 - 99 mg/dL  Glucose, capillary  Result Value Ref Range   Glucose-Capillary 107 (H) 70 - 99 mg/dL  Basic metabolic panel  Result Value Ref Range   Sodium 136 135 - 145 mmol/L   Potassium 2.8 (L) 3.5 - 5.1 mmol/L   Chloride 103 98 - 111 mmol/L   CO2 25 22 - 32 mmol/L   Glucose, Bld 99 70 - 99 mg/dL   BUN 8 8 - 23 mg/dL   Creatinine, Ser 7.82 0.44 - 1.00 mg/dL   Calcium 8.5 (L) 8.9 - 10.3 mg/dL   GFR, Estimated >95 >62 mL/min   Anion gap 8 5 - 15  CBC  Result Value Ref Range   WBC 10.5 4.0 - 10.5 K/uL   RBC 3.23 (L) 3.87 - 5.11 MIL/uL   Hemoglobin 9.3 (L) 12.0 - 15.0 g/dL   HCT 13.0 (L) 86.5 - 78.4 %   MCV 85.4 80.0 - 100.0 fL   MCH 28.8 26.0 - 34.0 pg   MCHC 33.7 30.0 - 36.0 g/dL   RDW 69.6 29.5 - 28.4 %   Platelets 253 150 - 400 K/uL   nRBC 0.0 0.0 - 0.2 %  Magnesium  Result Value Ref Range   Magnesium 1.9 1.7 - 2.4 mg/dL  Phosphorus  Result Value Ref Range   Phosphorus 2.3 (L) 2.5 - 4.6 mg/dL  Glucose, capillary  Result Value Ref Range   Glucose-Capillary 97 70 - 99 mg/dL  Glucose, capillary  Result Value Ref Range   Glucose-Capillary 88 70 - 99 mg/dL   Comment 1 Notify RN    Comment 2 Document in Chart   Glucose, capillary  Result Value Ref Range   Glucose-Capillary 101 (H) 70 - 99 mg/dL   Comment 1 Notify RN    Comment 2 Document in Chart   Hemoglobin A1c  Result Value Ref Range   Hgb A1c MFr Bld 5.2 4.8 - 5.6 %   Mean Plasma Glucose 102.54 mg/dL  Glucose, capillary  Result Value Ref Range   Glucose-Capillary 118 (H) 70 - 99 mg/dL   Comment 1 Notify RN    Comment 2 Document in Chart   Procalcitonin  Result Value Ref Range   Procalcitonin <0.10 ng/mL  CBC  Result Value Ref Range   WBC 9.9 4.0 -  10.5 K/uL   RBC 3.57 (L) 3.87 - 5.11 MIL/uL   Hemoglobin 10.1 (L) 12.0 - 15.0  g/dL   HCT 16.1 (L) 09.6 - 04.5 %   MCV 85.7 80.0 - 100.0 fL   MCH 28.3 26.0 - 34.0 pg   MCHC 33.0 30.0 - 36.0 g/dL   RDW 40.9 81.1 - 91.4 %   Platelets 317 150 - 400 K/uL   nRBC 0.0 0.0 - 0.2 %  Basic metabolic panel  Result Value Ref Range   Sodium 137 135 - 145 mmol/L   Potassium 4.4 3.5 - 5.1 mmol/L   Chloride 104 98 - 111 mmol/L   CO2 27 22 - 32 mmol/L   Glucose, Bld 105 (H) 70 - 99 mg/dL   BUN 9 8 - 23 mg/dL   Creatinine, Ser 7.82 0.44 - 1.00 mg/dL   Calcium 8.8 (L) 8.9 - 10.3 mg/dL   GFR, Estimated >95 >62 mL/min   Anion gap 6 5 - 15  Glucose, capillary  Result Value Ref Range   Glucose-Capillary 94 70 - 99 mg/dL   Comment 1 Notify RN   Glucose, capillary  Result Value Ref Range   Glucose-Capillary 117 (H) 70 - 99 mg/dL   Comment 1 Notify RN   Type and screen [facility]  Result Value Ref Range   ABO/RH(D) O POS    Antibody Screen NEG    Sample Expiration 01/25/2021,2359    Unit Number [id number]    Blood Component Type RED CELLS,LR    Unit division 00    Status of Unit ISSUED,FINAL    Unit tag comment EMERGENCY RELEASE    Transfusion Status OK TO TRANSFUSE    Crossmatch Result COMPATIBLE    Unit Number [id number]    Blood Component Type RED CELLS,LR    Unit division 00    Status of Unit ISSUED,FINAL    Unit tag comment EMERGENCY RELEASE    Transfusion Status OK TO TRANSFUSE    Crossmatch Result COMPATIBLE    Unit Number [id number]    Blood Component Type RED CELLS,LR    Unit division 00    Status of Unit REL FROM [facility]    Transfusion Status OK TO TRANSFUSE    Crossmatch Result Compatible    Unit Number [id number]    Blood Component Type RED CELLS,LR    Unit division 00    Status of Unit REL FROM [facility]    Transfusion Status OK TO TRANSFUSE    Crossmatch Result Compatible   Prepare RBC (crossmatch)  Result Value Ref Range   Order Confirmation      ORDER PROCESSED BY BLOOD BANK Performed at [facility],  [address], [place], [place] [address]   ABO/Rh  Result Value Ref Range   ABO/RH(D)      O POS Performed at [facility], [address] [place], [place] [address]   Type and screen  Result Value Ref Range   ABO/RH(D) O POS    Antibody Screen NEG    Sample Expiration      01/29/2021,2359 Performed at [facility], [address] [place], [place] [address]    Unit Number [id number]    Blood Component Type RED CELLS,LR    Unit division 00    Status of Unit ISSUED,FINAL    Transfusion Status OK TO TRANSFUSE    Crossmatch Result Compatible    Unit Number [id number]    Blood Component Type RED CELLS,LR  Unit division 00    [facility]    Transfusion Status OK TO TRANSFUSE    Crossmatch Result Compatible    Unit Number [id number]    Blood Component Type RED CELLS,LR    Unit division 00    Status of Unit ISSUED,FINAL    Transfusion Status OK TO TRANSFUSE    Crossmatch Result Compatible   Prepare fresh frozen plasma  Result Value Ref Range   Unit Number [id number]    Blood Component Type THW PLS APHR    Unit division B0    Status of Unit ISSUED,FINAL    Transfusion Status      OK TO TRANSFUSE Performed at [facility], [address] [place], [place] [address]    Unit Number [id number]    Blood Component Type THAWED PLASMA    Unit division 00    Status of Unit ISSUED,FINAL    Transfusion Status OK TO TRANSFUSE   BPAM RBC  Result Value Ref Range   ISSUE DATE / TIME [id number]    Blood Product Unit Number [id number]    Unit Type and Rh 5100    Blood Product Expiration Date [id number]    ISSUE DATE / TIME [id number]    Blood Product Unit Number [id number]    Unit Type and Rh 5100    Blood Product Expiration Date 202542706237202301112359    ISSUE DATE / TIME [id number]    Blood Product Unit Number [id number]    PRODUCT CODE E0382V00    Unit Type and Rh 5100    Blood Product Expiration Date  627035009381202301192359    Blood Product Unit Number [id number]    PRODUCT CODE E0382V00    Unit Type and Rh 5100    Blood Product Expiration Date 202301202359   BPAM RBC  Result Value Ref Range   ISSUE DATE / TIME [id number]    Blood Product Unit Number [id number]    PRODUCT CODE [id number]    Unit Type and Rh 5100    Blood Product Expiration Date [id number]    ISSUE DATE / TIME [id number]    Blood Product Unit Number [id number]    PRODUCT CODE [id number]    Unit Type and Rh 5100    Blood Product Expiration Date 973532992426202301202359    ISSUE DATE / TIME [id number]    Blood Product Unit Number [id number]    PRODUCT CODE [id number]    Unit Type and Rh 5100    Blood Product Expiration Date 149702637858202301012359   BPAM FFP  Result Value Ref Range   ISSUE DATE / TIME [id number]    Blood Product Unit Number [id number]    PRODUCT CODE E2121VB0    Unit Type and Rh 5100    Blood Product Expiration Date [id number]    ISSUE DATE / TIME [id number]    Blood Product Unit Number [id number]    PRODUCT CODE E2720V00    Unit Type and Rh 6200    Blood Product Expiration Date [id number]   Troponin I (High Sensitivity)  Result Value Ref Range   Troponin I (High Sensitivity) 31 (H) <18 ng/L  Troponin I (High Sensitivity)  Result Value Ref Range   Troponin I (High Sensitivity) 46 (H) <18 ng/L      Assessment & Plan:   Problem List Items Addressed This Visit       Cardiovascular and Mediastinum   Hypertension - Primary    Blood pressure at goal today 122/82.  Continue taking metoprolol 12.5 mg twice daily.  Relevant Medications   metoprolol tartrate (LOPRESSOR) 25 MG tablet   Other Relevant Orders   CBC with Differential/Platelet   COMPLETE METABOLIC PANEL WITH GFR     Respiratory   Asthma without status asthmaticus    She says her asthma is reactive to smoke and strong cologne.  She has had no issues with her asthma recently.  She does have an inhaler available if  needed.       Seasonal allergic rhinitis due to pollen   Relevant Medications   levocetirizine (XYZAL) 5 MG tablet     Digestive   Duodenal ulcer    She is currently under the care of GI Dr. Mia Creek.  Continue with current treatment plan.       Relevant Orders   CBC with Differential/Platelet   COMPLETE METABOLIC PANEL WITH GFR   Pyloric stenosis    She is currently under the care of GI Dr. Mia Creek.  Continue with current treatment plan.         Other   History of GI bleed    She is currently under the care of GI Dr. Mia Creek.  Continue with current treatment plan.       Other Visit Diagnoses     Screening for diabetes mellitus       Relevant Orders   Hemoglobin A1c   Screening for cholesterol level       Relevant Orders   Lipid panel   Encounter to establish care            Follow up plan: Return in about 6 months (around 12/16/2021) for follow up.

## 2021-06-16 LAB — COMPLETE METABOLIC PANEL WITH GFR
AG Ratio: 1.6 (calc) (ref 1.0–2.5)
ALT: 12 U/L (ref 6–29)
AST: 20 U/L (ref 10–35)
Albumin: 4.4 g/dL (ref 3.6–5.1)
Alkaline phosphatase (APISO): 58 U/L (ref 37–153)
BUN: 14 mg/dL (ref 7–25)
CO2: 29 mmol/L (ref 20–32)
Calcium: 9.9 mg/dL (ref 8.6–10.4)
Chloride: 100 mmol/L (ref 98–110)
Creat: 0.69 mg/dL (ref 0.50–1.05)
Globulin: 2.7 g/dL (calc) (ref 1.9–3.7)
Glucose, Bld: 73 mg/dL (ref 65–99)
Potassium: 4.1 mmol/L (ref 3.5–5.3)
Sodium: 137 mmol/L (ref 135–146)
Total Bilirubin: 0.4 mg/dL (ref 0.2–1.2)
Total Protein: 7.1 g/dL (ref 6.1–8.1)
eGFR: 95 mL/min/{1.73_m2} (ref >=60)

## 2021-06-16 LAB — HEMOGLOBIN A1C
Hgb A1c MFr Bld: 5.1 % of total Hgb (ref <5.7)
Mean Plasma Glucose: 100 mg/dL
eAG (mmol/L): 5.5 mmol/L

## 2021-06-16 LAB — CBC WITH DIFFERENTIAL/PLATELET
Absolute Monocytes: 432 cells/uL (ref 200–950)
Basophils Absolute: 60 cells/uL (ref 0–200)
Basophils Relative: 1 %
Eosinophils Absolute: 78 cells/uL (ref 15–500)
Eosinophils Relative: 1.3 %
HCT: 38.5 % (ref 35.0–45.0)
Hemoglobin: 12.9 g/dL (ref 11.7–15.5)
Lymphs Abs: 1572 cells/uL (ref 850–3900)
MCH: 30.8 pg (ref 27.0–33.0)
MCHC: 33.5 g/dL (ref 32.0–36.0)
MCV: 91.9 fL (ref 80.0–100.0)
MPV: 10.4 fL (ref 7.5–12.5)
Monocytes Relative: 7.2 %
Neutro Abs: 3858 cells/uL (ref 1500–7800)
Neutrophils Relative %: 64.3 %
Platelets: 307 10*3/uL (ref 140–400)
RBC: 4.19 10*6/uL (ref 3.80–5.10)
RDW: 13.1 % (ref 11.0–15.0)
Total Lymphocyte: 26.2 %
WBC: 6 10*3/uL (ref 3.8–10.8)

## 2021-06-16 LAB — LIPID PANEL
Cholesterol: 335 mg/dL — ABNORMAL HIGH (ref <200)
HDL: 68 mg/dL (ref >=50)
LDL Cholesterol (Calc): 240 mg/dL (calc) — ABNORMAL HIGH
Non-HDL Cholesterol (Calc): 267 mg/dL (calc) — ABNORMAL HIGH (ref <130)
Total CHOL/HDL Ratio: 4.9 (calc) (ref <5.0)
Triglycerides: 126 mg/dL (ref <150)

## 2021-06-19 DIAGNOSIS — H2513 Age-related nuclear cataract, bilateral: Secondary | ICD-10-CM | POA: Diagnosis not present

## 2021-06-19 DIAGNOSIS — H52223 Regular astigmatism, bilateral: Secondary | ICD-10-CM | POA: Diagnosis not present

## 2021-06-19 DIAGNOSIS — H40003 Preglaucoma, unspecified, bilateral: Secondary | ICD-10-CM | POA: Diagnosis not present

## 2021-07-03 ENCOUNTER — Other Ambulatory Visit: Payer: Self-pay | Admitting: Gastroenterology

## 2021-07-03 DIAGNOSIS — K311 Adult hypertrophic pyloric stenosis: Secondary | ICD-10-CM

## 2021-07-07 ENCOUNTER — Ambulatory Visit
Admission: RE | Admit: 2021-07-07 | Discharge: 2021-07-07 | Disposition: A | Discharge status: Home / Self Care | Payer: Medicare HMO | Source: Ambulatory Visit | Attending: Gastroenterology | Admitting: Gastroenterology

## 2021-07-07 DIAGNOSIS — K311 Adult hypertrophic pyloric stenosis: Secondary | ICD-10-CM | POA: Diagnosis not present

## 2021-07-07 DIAGNOSIS — K224 Dyskinesia of esophagus: Secondary | ICD-10-CM | POA: Diagnosis not present

## 2021-08-03 ENCOUNTER — Ambulatory Visit (INDEPENDENT_AMBULATORY_CARE_PROVIDER_SITE_OTHER): Payer: Medicare HMO | Admitting: Nurse Practitioner

## 2021-08-03 ENCOUNTER — Ambulatory Visit: Payer: Self-pay

## 2021-08-03 ENCOUNTER — Encounter: Payer: Self-pay | Admitting: Nurse Practitioner

## 2021-08-03 VITALS — BP 138/86 | HR 79 | Temp 98.5°F | Resp 16 | Ht 62.0 in | Wt 116.4 lb

## 2021-08-03 DIAGNOSIS — M5432 Sciatica, left side: Secondary | ICD-10-CM | POA: Diagnosis not present

## 2021-08-03 MED ORDER — METHOCARBAMOL 500 MG PO TABS: 500.0000 mg | ORAL_TABLET | Freq: Two times a day (BID) | ORAL | 0 refills | Status: DC | PRN

## 2021-08-03 MED ORDER — PREDNISONE 10 MG (21) PO TBPK: ORAL_TABLET | ORAL | 0 refills | Status: DC

## 2021-08-03 NOTE — Telephone Encounter (Signed)
Appointment 7/6

## 2021-08-03 NOTE — Telephone Encounter (Signed)
Chief Complaint: Pain in tailbone left side Symptoms: Pain Frequency: 1 week Pertinent Negatives: Patient denies Neurological deficit Disposition: [] ED /[] Urgent Care (no appt availability in office) / [x] Appointment(In office/virtual)/ []  [facility]/ [] Home Care/ [] Refused Recommended Disposition /[] [facility]/ []  Follow-up with PCP Additional Notes: Pt had a tailbone injury in 2016. Pt also had a bleeding ulcer.  Pt rates pain of 4-5/10    Summary: pain on left side tailbone   Pt stated she had a Bleeding Ulcer in her stomach, lost weight, and started having pain on her left side tailbone. Pt stated this has been going on for about a week.   When asked how her pain level is, the patient stated 4-5 out of 10.   Pt seeking clinical advice.      Reason for Disposition  [1] MODERATE back pain (e.g., interferes with normal activities) AND [2] present > 3 days  Answer Assessment - Initial Assessment Questions 1. ONSET: "When did the pain begin?"      1 week 2. LOCATION: "Where does it hurt?" (upper, mid or lower back)     Left tailbone 3. SEVERITY: "How bad is the pain?"  (e.g., Scale 1-10; mild, moderate, or severe)   - MILD (1-3): doesn't interfere with normal activities    - MODERATE (4-7): interferes with normal activities or awakens from sleep    - SEVERE (8-10): excruciating pain, unable to do any normal activities      4-5/10 4. PATTERN: "Is the pain constant?" (e.g., yes, no; constant, intermittent)      constant 5. RADIATION: "Does the pain shoot into your legs or elsewhere?"     no 6. CAUSE:  "What do you think is causing the back pain?"      Tailbone injury 2016 7. BACK OVERUSE:  "Any recent lifting of heavy objects, strenuous work or exercise?"     no 8. MEDICATIONS: "What have you taken so far for the pain?" (e.g., nothing, acetaminophen, NSAIDS)     IBU - did not help 9. NEUROLOGIC SYMPTOMS: "Do you have any weakness, numbness, or problems  with bowel/bladder control?"     no 10. OTHER SYMPTOMS: "Do you have any other symptoms?" (e.g., fever, abdominal pain, burning with urination, blood in urine)       no 11. PREGNANCY: "Is there any chance you are pregnant?" (e.g., yes, no; LMP)       na  Protocols used: Back Pain-A-AH

## 2021-08-03 NOTE — Progress Notes (Signed)
BP 138/86   Pulse 79   Temp 98.5 F (36.9 C)   Resp 16   Ht 5' 2"  (1.575 m)   Wt 116 lb 6.4 oz (52.8 kg)   SpO2 99%   BMI 21.29 kg/m    Subjective:    Patient ID: Angela Espinoza, female    DOB: 05/02/1955, 66 y.o.   MRN: [id number]  HPI: Angela Espinoza is a 66 y.o. female  Chief Complaint  Patient presents with   left butt pain   Left sciatica pain:  Patient states she has been having a shooting pain going down her left butt check.  Patient denies any fever, urinary complaints, loss of urine or bowel, numbness or tingling or any trauma.  Patient states she has been dealing with this pain for about a week.  Patient states that she has taken ibuprofen a couple of times.  Discussed with her recent GI bleed its not recommended that she take NSAIDs.  Patient understands.  Discussed taking Tylenol for pain.  We will send in muscle relaxer and steroid pack.  Discussed stretches that she can do and also discussed heat and ice therapy.  Relevant past medical, surgical, family and social history reviewed and updated as indicated. Interim medical history since our last visit reviewed. Allergies and medications reviewed and updated.  Review of Systems  Constitutional: Negative for fever or weight change.  Respiratory: Negative for cough and shortness of breath.   Cardiovascular: Negative for chest pain or palpitations.  Gastrointestinal: Negative for abdominal pain, no bowel changes.  Musculoskeletal: Negative for gait problem or joint swelling.  Skin: Negative for rash.  Neurological: Negative for dizziness or headache.  No other specific complaints in a complete review of systems (except as listed in HPI above).      Objective:    BP 138/86   Pulse 79   Temp 98.5 F (36.9 C)   Resp 16   Ht 5' 2"  (1.575 m)   Wt 116 lb 6.4 oz (52.8 kg)   SpO2 99%   BMI 21.29 kg/m   Wt Readings from Last 3 Encounters:  08/03/21 116 lb 6.4 oz (52.8 kg)  06/15/21 113 lb 11.2 oz  (51.6 kg)  06/06/21 110 lb (49.9 kg)    Physical Exam  Constitutional: Patient appears well-developed and well-nourished. No distress.  HEENT: head atraumatic, normocephalic, pupils equal and reactive to light,  neck supple Cardiovascular: Normal rate, regular rhythm and normal heart sounds.  No murmur heard. No BLE edema. Pulmonary/Chest: Effort normal and breath sounds normal. No respiratory distress. Abdominal: Soft.  There is no tenderness. MSK: straight leg test illicit pain Psychiatric: Patient has a normal mood and affect. behavior is normal. Judgment and thought content normal.  Results for orders placed or performed in visit on 06/15/21  Lipid panel  Result Value Ref Range   Cholesterol 335 (H) <200 mg/dL   HDL 68 > OR = 50 mg/dL   Triglycerides 126 <150 mg/dL   LDL Cholesterol (Calc) 240 (H) mg/dL (calc)   Total CHOL/HDL Ratio 4.9 <5.0 (calc)   Non-HDL Cholesterol (Calc) 267 (H) <130 mg/dL (calc)  CBC with Differential/Platelet  Result Value Ref Range   WBC 6.0 3.8 - 10.8 Thousand/uL   RBC 4.19 3.80 - 5.10 Million/uL   Hemoglobin 12.9 11.7 - 15.5 g/dL   HCT 38.5 35.0 - 45.0 %   MCV 91.9 80.0 - 100.0 fL   MCH 30.8 27.0 - 33.0 pg   MCHC 33.5  32.0 - 36.0 g/dL   RDW 13.1 11.0 - 15.0 %   Platelets 307 140 - 400 Thousand/uL   MPV 10.4 7.5 - 12.5 fL   Neutro Abs 3,858 1,500 - 7,800 cells/uL   Lymphs Abs 1,572 850 - 3,900 cells/uL   Absolute Monocytes 432 200 - 950 cells/uL   Eosinophils Absolute 78 15 - 500 cells/uL   Basophils Absolute 60 0 - 200 cells/uL   Neutrophils Relative % 64.3 %   Total Lymphocyte 26.2 %   Monocytes Relative 7.2 %   Eosinophils Relative 1.3 %   Basophils Relative 1.0 %  COMPLETE METABOLIC PANEL WITH GFR  Result Value Ref Range   Glucose, Bld 73 65 - 99 mg/dL   BUN 14 7 - 25 mg/dL   Creat 0.69 0.50 - 1.05 mg/dL   eGFR 95 > OR = 60 mL/min/1.97m   BUN/Creatinine Ratio NOT APPLICABLE 6 - 22 (calc)   Sodium 137 135 - 146 mmol/L   Potassium  4.1 3.5 - 5.3 mmol/L   Chloride 100 98 - 110 mmol/L   CO2 29 20 - 32 mmol/L   Calcium 9.9 8.6 - 10.4 mg/dL   Total Protein 7.1 6.1 - 8.1 g/dL   Albumin 4.4 3.6 - 5.1 g/dL   Globulin 2.7 1.9 - 3.7 g/dL (calc)   AG Ratio 1.6 1.0 - 2.5 (calc)   Total Bilirubin 0.4 0.2 - 1.2 mg/dL   Alkaline phosphatase (APISO) 58 37 - 153 U/L   AST 20 10 - 35 U/L   ALT 12 6 - 29 U/L  Hemoglobin A1c  Result Value Ref Range   Hgb A1c MFr Bld 5.1 <5.7 % of total Hgb   Mean Plasma Glucose 100 mg/dL   eAG (mmol/L) 5.5 mmol/L      Assessment & Plan:   Problem List Items Addressed This Visit   None Visit Diagnoses     Sciatica of left side    -  Primary   Do stretching, use heat and ice therapy.  We will send in steroid pack and muscle relaxer.  Take Tylenol for pain.   Relevant Medications   predniSONE (STERAPRED UNI-PAK 21 TAB) 10 MG (21) TBPK tablet   methocarbamol (ROBAXIN) 500 MG tablet        Follow up plan: Return if symptoms worsen or fail to improve.

## 2021-08-14 ENCOUNTER — Ambulatory Visit
Admission: RE | Admit: 2021-08-14 | Discharge: 2021-08-14 | Disposition: A | Discharge status: Home / Self Care | Payer: Medicare HMO | Source: Ambulatory Visit | Attending: Internal Medicine | Admitting: Internal Medicine

## 2021-08-14 ENCOUNTER — Ambulatory Visit
Admission: RE | Admit: 2021-08-14 | Discharge: 2021-08-14 | Disposition: A | Discharge status: Home / Self Care | Payer: Medicare HMO | Attending: Internal Medicine | Admitting: Internal Medicine

## 2021-08-14 ENCOUNTER — Ambulatory Visit: Payer: Self-pay

## 2021-08-14 ENCOUNTER — Encounter: Payer: Self-pay | Admitting: Internal Medicine

## 2021-08-14 ENCOUNTER — Ambulatory Visit (INDEPENDENT_AMBULATORY_CARE_PROVIDER_SITE_OTHER): Payer: Medicare HMO | Admitting: Internal Medicine

## 2021-08-14 VITALS — BP 126/82 | HR 84 | Temp 98.3°F | Resp 16 | Ht 62.0 in | Wt 115.5 lb

## 2021-08-14 DIAGNOSIS — Z8781 Personal history of (healed) traumatic fracture: Secondary | ICD-10-CM

## 2021-08-14 DIAGNOSIS — M545 Low back pain, unspecified: Secondary | ICD-10-CM | POA: Diagnosis not present

## 2021-08-14 DIAGNOSIS — G5702 Lesion of sciatic nerve, left lower limb: Secondary | ICD-10-CM

## 2021-08-14 DIAGNOSIS — M5432 Sciatica, left side: Secondary | ICD-10-CM | POA: Insufficient documentation

## 2021-08-14 MED ORDER — DEXAMETHASONE SODIUM PHOSPHATE 100 MG/10ML IJ SOLN
10.0000 mg | Freq: Once | INTRAMUSCULAR | Status: AC
  Administered 2021-08-14: 10 mg via INTRAMUSCULAR

## 2021-08-14 MED ORDER — TIZANIDINE HCL 4 MG PO TABS: 4.0000 mg | ORAL_TABLET | Freq: Four times a day (QID) | ORAL | 0 refills | Status: DC | PRN

## 2021-08-14 NOTE — Telephone Encounter (Signed)
  Chief Complaint: Back pain sciatica Symptoms: pain shooting down leg Frequency: Pain has been ongoing for a few weeks, and is not resolving Pertinent Negatives: Patient denies  Disposition: [] ED /[] Urgent Care (no appt availability in office) / [x] Appointment(In office/virtual)/ []  [facility]/ [] Home Care/ [] Refused Recommended Disposition /[] [facility]/ []  Follow-up with PCP Additional Notes: Pt has been seen for this 08/03/2021, but reports that the pain is not resolving . Pt also states that she did not like taking prednisone.  Reason for Disposition  [1] Pain radiates into the thigh or further down the leg AND [2] one leg  Protocols used: Back Pain-A-AH

## 2021-08-14 NOTE — Progress Notes (Signed)
Acute Office Visit  Subjective:     Patient ID: Angela Espinoza, female    DOB: 1955/04/11, 66 y.o.   MRN: [id number]  Chief Complaint  Patient presents with   Sciatica    HPI Patient is in today for back pain. Was just seen on 08/03/21 for same issue, was given steroid pack and muscle relaxer. History of L1 fracture in 2016.   BACK PAIN Duration:  11 days  Mechanism of injury: unknown - had been cycling on bike and walker  Location: Left Onset: sudden Severity: 7/10 Quality: burning and shooting Frequency: constant Radiation: L leg below the knee Aggravating factors: prolonged sitting Alleviating factors: NSAIDs but cannot take Status: worse Treatments attempted: rest, APAP, and physical therapy  Relief with NSAIDs?:  yes but cannot take due to GI bleed Nighttime pain:  no Paresthesias / decreased sensation:  yes Bowel / bladder incontinence:  no Fevers:  no Dysuria / urinary frequency:  no   Review of Systems  Constitutional:  Negative for chills and fever.  Musculoskeletal:  Positive for back pain.  Skin: Negative.   Neurological:  Positive for tingling. Negative for weakness.        Objective:    BP 126/82   Pulse 84   Temp 98.3 F (36.8 C)   Resp 16   Ht 5\' 2"  (1.575 m)   Wt 115 lb 8 oz (52.4 kg)   SpO2 99%   BMI 21.13 kg/m  BP Readings from Last 3 Encounters:  08/14/21 126/82  08/03/21 138/86  06/15/21 122/82   Wt Readings from Last 3 Encounters:  08/14/21 115 lb 8 oz (52.4 kg)  08/03/21 116 lb 6.4 oz (52.8 kg)  06/15/21 113 lb 11.2 oz (51.6 kg)     Physical Exam Constitutional:      Appearance: Normal appearance.  HENT:     Head: Normocephalic and atraumatic.  Eyes:     Conjunctiva/sclera: Conjunctivae normal.  Cardiovascular:     Rate and Rhythm: Normal rate and regular rhythm.  Pulmonary:     Effort: Pulmonary effort is normal.     Breath sounds: Normal breath sounds.  Musculoskeletal:     Lumbar back: Tenderness  present. No swelling. Decreased range of motion.     Comments: Decreased range of motion and triggering sciatica symptoms with flexion and extension. Tight piriformis on left.   Skin:    General: Skin is warm and dry.  Neurological:     General: No focal deficit present.     Mental Status: She is alert. Mental status is at baseline.  Psychiatric:        Mood and Affect: Mood normal.        Behavior: Behavior normal.     No results found for any visits on 08/14/21.      Assessment & Plan:   1. Sciatica of left side/Piriformis syndrome of left side: Patient did not like oral steroids but cannot take anti-inflammatories due to GI bleed history. Will give steroid IM today and will switch muscle relaxer to Zanaflex. Also discussed stretches for piriformis syndrome and sciatica symptoms. Osteopathic manipulation was performed with the patient's consent. She was placed in the prone position, left left was extended and abducted and myofacial release was used on the piriformis muscle. This cycle was repeated twice with moderate reduction in the patient's pain.   - tiZANidine (ZANAFLEX) 4 MG tablet; Take 1 tablet (4 mg total) by mouth every 6 (six) hours as needed for muscle  spasms.  Dispense: 30 tablet; Refill: 0 - dexamethasone (DECADRON) injection 10 mg  2. History of compression fracture of spine: Repeat lumbar x-ray today due to history of compression fracture.   - DG Lumbar Spine Complete; Future   Return if symptoms worsen or fail to improve.  Margarita Mail, DO

## 2021-08-14 NOTE — Telephone Encounter (Signed)
Need appointment

## 2021-08-14 NOTE — Patient Instructions (Addendum)
It was great seeing you today!  Plan discussed at today's visit: -Steroid injection today -Stop first muscle relaxer, try new one -Try new exercises and stretches below but start slow. Always stretch before and after exercise -Let me know if you would like to do more physical manipulation  -Can also so topical anti-inflammatories like Voltaren, Bengay, etc.   Follow up in: as needed   Take care and let us know if you have any questions or concerns prior to your next visit.  Dr. Caralee Ates  Sciatica Rehab Ask your health care provider which exercises are safe for you. Do exercises exactly as told by your health care provider and adjust them as directed. It is normal to feel mild stretching, pulling, tightness, or discomfort as you do these exercises. Stop right away if you feel sudden pain or your pain gets worse. Do not begin these exercises until told by your health care provider. Stretching and range-of-motion exercises These exercises warm up your muscles and joints and improve the movement and flexibility of your hips and back. These exercises also help to relieve pain, numbness, and tingling. Sciatic nerve glide Sit in a chair with your head facing down toward your chest. Place your hands behind your back. Let your shoulders slump forward. Slowly straighten one of your legs while you tilt your head back as if you are looking toward the ceiling. Only straighten your leg as far as you can without making your symptoms worse. Hold this position for __________ seconds. Slowly return to the starting position. Repeat with your other leg. Repeat __________ times. Complete this exercise __________ times a day. Knee to chest with hip adduction and internal rotation  Lie on your back on a firm surface with both legs straight. Bend one of your knees and move it up toward your chest until you feel a gentle stretch in your lower back and buttock. Then, move your knee toward the shoulder that is on  the opposite side from your leg. This is hip adduction and internal rotation. Hold your leg in this position by holding on to the front of your knee. Hold this position for __________ seconds. Slowly return to the starting position. Repeat with your other leg. Repeat __________ times. Complete this exercise __________ times a day. Prone extension on elbows  Lie on your abdomen on a firm surface. A bed may be too soft for this exercise. Prop yourself up on your elbows. Use your arms to help lift your chest up until you feel a gentle stretch in your abdomen and your lower back. This will place some of your body weight on your elbows. If this is uncomfortable, try stacking pillows under your chest. Your hips should stay down, against the surface that you are lying on. Keep your hip and back muscles relaxed. Hold this position for __________ seconds. Slowly relax your upper body and return to the starting position. Repeat __________ times. Complete this exercise __________ times a day. Strengthening exercises These exercises build strength and endurance in your back. Endurance is the ability to use your muscles for a long time, even after they get tired. Pelvic tilt This exercise strengthens the muscles that lie deep in the abdomen. Lie on your back on a firm surface. Bend your knees and keep your feet flat on the floor. Tense your abdominal muscles. Tip your pelvis up toward the ceiling and flatten your lower back into the floor. To help with this exercise, you may place a small towel under  your lower back and try to push your back into the towel. Hold this position for __________ seconds. Let your muscles relax completely before you repeat this exercise. Repeat __________ times. Complete this exercise __________ times a day. Alternating arm and leg raises  Get on your hands and knees on a firm surface. If you are on a hard floor, you may want to use padding, such as an exercise mat, to  cushion your knees. Line up your arms and legs. Your hands should be directly below your shoulders, and your knees should be directly below your hips. Lift your left leg behind you. At the same time, raise your right arm and straighten it in front of you. Do not lift your leg higher than your hip. Do not lift your arm higher than your shoulder. Keep your abdominal and back muscles tight. Keep your hips facing the ground. Do not arch your back. Keep your balance carefully, and do not hold your breath. Hold this position for __________ seconds. Slowly return to the starting position. Repeat with your right leg and your left arm. Repeat __________ times. Complete this exercise __________ times a day. Posture and body mechanics Good posture and healthy body mechanics can help to relieve stress in your body's tissues and joints. Body mechanics refers to the movements and positions of your body while you do your daily activities. Posture is part of body mechanics. Good posture means: Your spine is in its natural S-curve position (neutral). Your shoulders are pulled back slightly. Your head is not tipped forward. Follow these guidelines to improve your posture and body mechanics in your everyday activities. Standing  When standing, keep your spine neutral and your feet about hip width apart. Keep a slight bend in your knees. Your ears, shoulders, and hips should line up. When you do a task in which you stand in one place for a long time, place one foot up on a stable object that is 2-4 inches (5-10 cm) high, such as a footstool. This helps keep your spine neutral. Sitting  When sitting, keep your spine neutral and keep your feet flat on the floor. Use a footrest, if necessary, and keep your thighs parallel to the floor. Avoid rounding your shoulders, and avoid tilting your head forward. When working at a desk or a computer, keep your desk at a height where your hands are slightly lower than your  elbows. Slide your chair under your desk so you are close enough to maintain good posture. When working at a computer, place your monitor at a height where you are looking straight ahead and you do not have to tilt your head forward or downward to look at the screen. Resting When lying down and resting, avoid positions that are most painful for you. If you have pain with activities such as sitting, bending, stooping, or squatting, lie in a position in which your body does not bend very much. For example, avoid curling up on your side with your arms and knees near your chest (fetal position). If you have pain with activities such as standing for a long time or reaching with your arms, lie with your spine in a neutral position and bend your knees slightly. Try the following positions: Lying on your side with a pillow between your knees. Lying on your back with a pillow under your knees. Lifting  When lifting objects, keep your feet at least shoulder width apart and tighten your abdominal muscles. Bend your knees and hips and  keep your spine neutral. It is important to lift using the strength of your legs, not your back. Do not lock your knees straight out. Always ask for help to lift heavy or awkward objects. This information is not intended to replace advice given to you by your health care provider. Make sure you discuss any questions you have with your health care provider. Document Revised: 05/09/2018 Document Reviewed: 02/06/2018 [employer] Patient Education  2023 [employer]  Piriformis Syndrome Rehab Ask your health care provider which exercises are safe for you. Do exercises exactly as told by your health care provider and adjust them as directed. It is normal to feel mild stretching, pulling, tightness, or discomfort as you do these exercises. Stop right away if you feel sudden pain or your pain gets worse. Do not begin these exercises until told by your health care provider. Stretching  and range-of-motion exercises These exercises warm up your muscles and joints and improve the movement and flexibility of your hip and pelvis. The exercises also help to relieve pain, numbness, and tingling. Nerve root  Sit on a firm surface that is high enough that you can swing your left / right foot freely. Place a folded towel under your left / right thigh. This is optional. Drop your head forward and round your back. While you keep your left / right foot relaxed, slowly straighten your left / right knee until you feel a slight pull behind your knee or calf. If your leg is fully extended and you still do not feel a pull, slowly tilt your foot and toes toward you. Hold this position for __________ seconds. Slowly return your knee to its starting position. Hip rotation This is an exercise in which you lie on your back and stretch the muscles that rotate your hip (hip rotators) to stretch your buttocks. Lie on your back on a firm surface. Pull your left / right knee toward your same shoulder with your left / right hand until your knee is pointing toward the ceiling. Hold your left / right ankle with your other hand. Keeping your knee steady, gently pull your left / right ankle toward your other shoulder until you feel a stretch in your buttocks. Hold this position for __________ seconds. Repeat __________ times. Complete this exercise __________ times a day. Hip extensor This is an exercise in which you lie on your back and pull your knee to your chest. Lie on your back on a firm surface. Both of your legs should be straight. Pull your left / right knee to your chest. Hold your leg in this position by holding on to the back of your thigh or the front of your knee. Hold this position for __________ seconds. Slowly return to the starting position. Repeat __________ times. Complete this exercise __________ times a day. Strengthening exercises These exercises build strength and endurance in  your hip and thigh muscles. Endurance is the ability to use your muscles for a long time, even after they get tired. Straight leg raises, side-lying This exercise strengthens the muscles that rotate the leg at the hip and move it away from your body (hip abductors). Lie on your side with your left / right leg in the top position. Lie so your head, shoulder, knee, and hip line up. Bend your bottom knee to help you balance. Lift your top leg 4-6 inches (10-15 cm) while keeping your toes pointed straight ahead. Hold this position for __________ seconds. Slowly lower your leg to the starting  position. Let your muscles relax completely after each repetition. Repeat __________ times. Complete this exercise __________ times a day. Hip abduction and rotation This is sometimes called quadruped (on hands and knees) exercises. Get on your hands and knees on a firm, lightly padded surface. Your hands should be directly below your shoulders, and your knees should be directly below your hips. Lift your left / right knee out to the side. Keep your knee bent. Do not twist your body. Hold this position for __________ seconds. Slowly lower your leg. Repeat __________ times. Complete this exercise __________ times a day. Straight leg raises, prone This exercise stretches the muscles that move the hips (hip extensors). Lie on your abdomen on a firm surface (prone position). Tense the muscles in your buttocks and lift your left / right leg about 4 inches (10 cm). Keep your knee straight as you lift your leg. If you cannot lift your leg that high without arching your back, place a pillow under your hips. Hold this position for __________ seconds. Slowly lower your leg to the starting position. Let your muscles relax completely after each repetition. Repeat __________ times. Complete this exercise __________ times a day. This information is not intended to replace advice given to you by your health care provider.  Make sure you discuss any questions you have with your health care provider. Document Revised: 07/19/2020 Document Reviewed: 07/19/2020 [employer] Patient Education  2023 [employer].

## 2021-08-20 NOTE — Progress Notes (Signed)
PROVIDER NOTE: Information contained herein reflects review and annotations entered in association with encounter. Interpretation of such information and data should be left to medically-trained personnel. Information provided to patient can be located elsewhere in the medical record under "Patient Instructions". Document created using STT-dictation technology, any transcriptional errors that may result from process are unintentional.    Patient: Angela Espinoza  Service Category: E/M  Provider: Gaspar Cola, MD  DOB: [birth year]  DOS: 08/21/2021  Specialty: Interventional Pain Management  MRN: [id number]  Setting: Ambulatory outpatient  PCP: Bo Merino, FNP  Type: Established Patient    Referring Provider: Bo Merino, FNP  Location: Office  Delivery: Face-to-face     HPI  Angela Espinoza, a 66 y.o. year old female, is here today because of her Chronic left-sided low back pain without sciatica [M54.50, G89.29]. [name]'s primary complain today is Rectal Pain (left) Last encounter: My last encounter with her was on Visit date not found. Pertinent problems: Ms. Fletchall has Chronic low back pain (1ry area of Pain) (Left) w/o sciatica; Chronic pain syndrome; History of L1 kyphoplasty; Traumatic compression fracture of L1 lumbar vertebra, sequela (2016); Spondylosis without myelopathy or radiculopathy, lumbar region; Lumbar facet syndrome (Left); DDD (degenerative disc disease), lumbosacral; Thoracic facet syndrome (Left); Spondylosis without myelopathy or radiculopathy, lumbosacral region; Spondylosis without myelopathy or radiculopathy, thoracic region; DDD (degenerative disc disease), thoracolumbar; Neurogenic pain; Other intervertebral disc degeneration, thoracic region; Other intervertebral disc degeneration, lumbar region; Gluteal pain (Left); and Muscle pain, myofascial (Left buttocks) on their pertinent problem list. Pain Assessment: Severity of Chronic pain is  reported as a 4 /10. Location: Back Lower/pain radiaties down at times. Onset: More than a month ago. Quality: Aching, Burning, Constant, Throbbing. Timing: Constant. Modifying factor(s): procedures, ice and heat,. Vitals:  height is 5' 2"  (1.575 m) and weight is 115 lb (52.2 kg). Her temperature is 97.1 F (36.2 C) (abnormal). Her blood pressure is 113/73 and her pulse is 74. Her oxygen saturation is 100%.   Reason for encounter: new problems.  According to the patient for the past month or so she has been having some pain in the left buttocks area.  She comes in today for evaluation.  Apparently she has gone through some medical issues since the last time that I saw her where she had a bleeding ulcer where she ended up in the hospital.  She describes having coded while she was in the hospital.  Thankfully, she is doing much better now.  The pain that she is currently experiencing is not the same one that we have treated for her in the past.  She is currently not having pain in the lower back.  Her pain is in the lower buttocks area and seems to radiate down to the back of the knee.  The primary pain is around the area of the ischial bursa.  However, exam today was negative for pain in the ischial bursa.  She also had a negative Patrick maneuver for pain coming from the hip joint of the SI joint.  Left trochanteric bursa was also negative for triggering the pain.  Interestingly, she refers that her worst pain is not when she is sitting or when she is standing, is when she is standing up which would suggest the pain to be muscular.  She has been doing some exercises and she has noticed any improvement.  At this point, I have recommended a physical therapy evaluation for further recommendations on exercises that  she can do.  This pain appears to be myofascial and coming from the area of the gluteal muscles.  She refers having being given some muscle relaxants for her pain, but she also refers that they usually do  not agree with her.  She is also not taking any more of the meloxicam which she thinks is the culprit for the bleeding ulcer that she had.  She continues to have significant problems with gastroesophageal reflux disease and they are currently evaluating her for further therapy.  Pharmacotherapy Assessment  Analgesic: No opioid analgesics from our practice. MME/day: 0 mg/day   Monitoring: [place] PMP: PDMP reviewed during this encounter.       Pharmacotherapy: No side-effects or adverse reactions reported. Compliance: No problems identified. Effectiveness: Clinically acceptable.  Chauncey Fischer, RN  08/21/2021 11:49 AM  Sign when Signing Visit Safety precautions to be maintained throughout the outpatient stay will include: orient to surroundings, keep bed in low position, maintain call bell within reach at all times, provide assistance with transfer out of bed and ambulation.     UDS:  Summary  Date Value Ref Range Status  12/10/2017 FINAL  Final    Comment:    ==================================================================== [facility] COMP DRUG ANALYSIS,UR ==================================================================== Test                             Result       Flag       Units Drug Present and Declared for Prescription Verification   Cyclobenzaprine                PRESENT      EXPECTED   Amitriptyline                  PRESENT      EXPECTED   Metoprolol                     PRESENT      EXPECTED Drug Absent but Declared for Prescription Verification   Desmethylcyclobenzaprine       Not Detected UNEXPECTED   Duloxetine                     Not Detected UNEXPECTED ==================================================================== Test                      Result    Flag   Units      Ref Range   Creatinine              28               mg/dL      >=20 ==================================================================== Declared Medications:  The flagging and interpretation on  this report are based on the  following declared medications.  Unexpected results may arise from  inaccuracies in the declared medications.  **Note: The testing scope of this panel includes these medications:  Amitriptyline  Cyclobenzaprine  Duloxetine  Metoprolol  **Note: The testing scope of this panel does not include following  reported medications:  Cholecalciferol  Hydrochlorothiazide  Levocetirizine  Losartan (Losartan Potassium)  Magnesium  Meloxicam  Multivitamin  Supplement  Supplement (Omega-3)  Vitamin B (Biotin)  Vitamin C ==================================================================== For clinical consultation, please call [phone]. ====================================================================      ROS  Constitutional: Denies any fever or chills Gastrointestinal: No reported hemesis, hematochezia, vomiting, or acute GI distress Musculoskeletal: Denies any acute onset joint swelling,  redness, loss of ROM, or weakness Neurological: No reported episodes of acute onset apraxia, aphasia, dysarthria, agnosia, amnesia, paralysis, loss of coordination, or loss of consciousness  Medication Review  Black Elderberry, Black Pepper-Turmeric, Omega-3 Fatty Acids, famotidine, levocetirizine, metoprolol tartrate, multivitamin, pantoprazole, polyethylene glycol powder, and vitamin C  History Review  Allergy: Ms. Broaden is allergic to mobic [meloxicam], gnp childrens easy-melts [acetaminophen], and indapamide. Drug: Ms. Eaker  reports no history of drug use. Alcohol:  reports that she does not currently use alcohol. Tobacco:  reports that she has never smoked. She has been exposed to tobacco smoke. She has never used smokeless tobacco. Social: Ms. Tranchina  reports that she has never smoked. She has been exposed to tobacco smoke. She has never used smokeless tobacco. She reports that she does not currently use alcohol. She reports that she does not use  drugs. Medical:  has a past medical history of Anxiety, Asthma, Depression, Hyperlipidemia, Hypertension, and IBS (irritable bowel syndrome). Surgical: Ms. Nazir  has a past surgical history that includes Oophorectomy; Exploratory laparotomy; Foot surgery; Colonoscopy; Tonsillectomy; Axillary lymph node biopsy (Right, 08/03/2014); Kyphoplasty (N/A, 12/10/2014); Abdominal hysterectomy (age 33); Breast biopsy (Left, 10/07/2018); Breast biopsy (Left, 10/21/2019); Esophagogastroduodenoscopy (egd) with propofol (N/A, 01/26/2021); Esophagogastroduodenoscopy (egd) with propofol (N/A, 04/17/2021); Diagnostic laparoscopy; Esophagogastroduodenoscopy (egd) with propofol (N/A, 05/02/2021); and Esophagogastroduodenoscopy (egd) with propofol (N/A, 06/06/2021). Family: family history includes Cancer in her father; Heart attack in her mother; Heart disease in her mother; Lung cancer in her father.  Laboratory Chemistry Profile   Renal Lab Results  Component Value Date   BUN 14 06/15/2021   CREATININE 0.69 25/05/3974   BCR NOT APPLICABLE 73/41/9379   GFRAA 81 01/01/2018   GFRNONAA >60 01/31/2021    Hepatic Lab Results  Component Value Date   AST 20 06/15/2021   ALT 12 06/15/2021   ALBUMIN 2.2 (L) 01/26/2021   ALKPHOS 46 01/26/2021   LIPASE 27 01/26/2021    Electrolytes Lab Results  Component Value Date   NA 137 06/15/2021   K 4.1 06/15/2021   CL 100 06/15/2021   CALCIUM 9.9 06/15/2021   MG 1.9 01/30/2021   PHOS 2.3 (L) 01/30/2021    Bone Lab Results  Component Value Date   25OHVITD1 40 01/01/2018   25OHVITD2 <1.0 01/01/2018   25OHVITD3 40 01/01/2018    Inflammation (CRP: Acute Phase) (ESR: Chronic Phase) Lab Results  Component Value Date   CRP 1 12/10/2017   ESRSEDRATE 3 12/10/2017   LATICACIDVEN 0.9 01/26/2021         Note: Above Lab results reviewed.  Recent Imaging Review  DG Lumbar Spine Complete CLINICAL DATA:  Low back pain.  EXAM: LUMBAR SPINE - COMPLETE 4+  VIEW  COMPARISON:  November 28, 2020  FINDINGS: There is no evidence of lumbar spine fracture. Status post prior vertebroplasty of L1 unchanged. No significant degenerative joint changes are noted. Alignment is normal. Intervertebral disc spaces are maintained.  IMPRESSION: No acute fracture or dislocation.  Prior L1 vertebroplasty unchanged  Electronically Signed   By: Abelardo Diesel M.D.   On: 08/15/2021 11:35 Note: Reviewed        Physical Exam  General appearance: Well nourished, well developed, and well hydrated. In no apparent acute distress Mental status: Alert, oriented x 3 (person, place, & time)       Respiratory: No evidence of acute respiratory distress Eyes: PERLA Vitals: BP 113/73   Pulse 74   Temp (!) 97.1 F (36.2 C)   Ht 5'  2" (1.575 m)   Wt 115 lb (52.2 kg)   SpO2 100%   BMI 21.03 kg/m  BMI: Estimated body mass index is 21.03 kg/m as calculated from the following:   Height as of this encounter: 5' 2"  (1.575 m).   Weight as of this encounter: 115 lb (52.2 kg). Ideal: Ideal body weight: 50.1 kg (110 lb 7.2 oz) Adjusted ideal body weight: 50.9 kg (112 lb 4.3 oz)  Assessment   Diagnosis Status  1. Chronic low back pain (1ry area of Pain) (Left) w/o sciatica   2. DDD (degenerative disc disease), lumbosacral   3. Lumbar facet syndrome (Left)   4. Gluteal pain (Left)   5. Muscle pain, myofascial (Left buttocks)    Controlled Controlled Controlled   Updated Problems: Problem  Gluteal pain (Left)  Muscle pain, myofascial (Left buttocks)    Plan of Care  Problem-specific:  No problem-specific Assessment & Plan notes found for this encounter.  Ms. Donne Robillard has a current medication list which includes the following long-term medication(s): levocetirizine, metoprolol tartrate, and pantoprazole.  Pharmacotherapy (Medications Ordered): No orders of the defined types were placed in this encounter.  Orders:  Orders Placed This Encounter   Procedures   Ambulatory referral to Physical Therapy    Referral Priority:   Routine    Referral Type:   Physical Medicine    Referral Reason:   Specialty Services Required    Requested Specialty:   Physical Therapy    Number of Visits Requested:   1   Follow-up plan:   No follow-ups on file.     Interventional Therapies  Risk  Complexity Considerations:   WNL   Planned  Pending:   Therapeutic left thoracolumbar (T12, L1, L2, & L3) MB RFA #2    Under consideration:   Diagnostic left L1-2 LESI #1    Completed:   Diagnostic left thoracolumbar facet MBB (T12, L1, L2, & L3) block x2  Therapeutic left thoracolumbar (T12, L1, L2, & L3) MB RFA x2 (04/26/2020) (100/100/80) Diagnostic left lumbar facet MBB x2 (03/29/2020)  Diagnostic left lumbar facet (L4, L5, S1) MBB x1 (12/27/2020) (100/100/80/100)  Therapeutic left lumbar (L4, L5, S1) MB RFA x1 (07/28/2019) (100/100/100/100) Note: The patient has indicated that she has had prior lumbar facet radiofrequency ablations at [facility].   Palliative options:   Palliative left thoracolumbar (T12, L1, L2, & L3) MB RFA #2  Palliative left lumbar (L4, L5, S1) MB RFA #2     Recent Visits No visits were found meeting these conditions. Showing recent visits within past 90 days and meeting all other requirements Today's Visits Date Type Provider Dept  08/21/21 Office Visit Milinda Pointer, MD [facility]  Showing today's visits and meeting all other requirements Future Appointments No visits were found meeting these conditions. Showing future appointments within next 90 days and meeting all other requirements  I discussed the assessment and treatment plan with the patient. The patient was provided an opportunity to ask questions and all were answered. The patient agreed with the plan and demonstrated an understanding of the instructions.  Patient advised to call back or seek an in-person evaluation if the symptoms or condition  worsens.  Duration of encounter: 30 minutes.  Total time on encounter, as per AMA guidelines included both the face-to-face and non-face-to-face time personally spent by the physician and/or other qualified health care professional(s) on the day of the encounter (includes time in activities that require the physician or other qualified health care  professional and does not include time in activities normally performed by clinical staff). Physician's time may include the following activities when performed: preparing to see the patient (eg, review of tests, pre-charting review of records) obtaining and/or reviewing separately obtained history performing a medically appropriate examination and/or evaluation counseling and educating the patient/family/caregiver ordering medications, tests, or procedures referring and communicating with other health care professionals (when not separately reported) documenting clinical information in the electronic or other health record independently interpreting results (not separately reported) and communicating results to the patient/ family/caregiver care coordination (not separately reported)  Note by: Gaspar Cola, MD Date: 08/21/2021; Time: 1:03 PM

## 2021-08-21 ENCOUNTER — Ambulatory Visit: Payer: Medicare HMO | Attending: Pain Medicine | Admitting: Pain Medicine

## 2021-08-21 ENCOUNTER — Encounter: Payer: Self-pay | Admitting: Pain Medicine

## 2021-08-21 VITALS — BP 113/73 | HR 74 | Temp 97.1°F | Ht 62.0 in | Wt 115.0 lb

## 2021-08-21 DIAGNOSIS — M545 Low back pain, unspecified: Secondary | ICD-10-CM | POA: Insufficient documentation

## 2021-08-21 DIAGNOSIS — M47816 Spondylosis without myelopathy or radiculopathy, lumbar region: Secondary | ICD-10-CM | POA: Diagnosis not present

## 2021-08-21 DIAGNOSIS — M7918 Myalgia, other site: Secondary | ICD-10-CM | POA: Diagnosis not present

## 2021-08-21 DIAGNOSIS — G8929 Other chronic pain: Secondary | ICD-10-CM | POA: Insufficient documentation

## 2021-08-21 DIAGNOSIS — M5137 Other intervertebral disc degeneration, lumbosacral region: Secondary | ICD-10-CM | POA: Insufficient documentation

## 2021-08-21 NOTE — Progress Notes (Signed)
Safety precautions to be maintained throughout the outpatient stay will include: orient to surroundings, keep bed in low position, maintain call bell within reach at all times, provide assistance with transfer out of bed and ambulation.  

## 2021-08-21 NOTE — Patient Instructions (Signed)
______________________________________________________________________  Preparing for Procedure with Sedation  NOTICE: Due to recent regulatory changes, starting on August 29, 2020, procedures requiring intravenous (IV) sedation will no longer be performed at the [facility].  These types of procedures are required to be performed at [facility] ambulatory surgery facility.  We are very sorry for the inconvenience.  Procedure appointments are limited to planned procedures: No Prescription Refills. No disability issues will be discussed. No medication changes will be discussed.  Instructions: Oral Intake: Do not eat or drink anything for at least 8 hours prior to your procedure. (Exception: Blood Pressure Medication. See below.) Transportation: A driver is required. You may not drive yourself after the procedure. Blood Pressure Medicine: Do not forget to take your blood pressure medicine with a sip of water the morning of the procedure. If your Diastolic (lower reading) is above 100 mmHg, elective cases will be cancelled/rescheduled. Blood thinners: These will need to be stopped for procedures. Notify our staff if you are taking any blood thinners. Depending on which one you take, there will be specific instructions on how and when to stop it. Diabetics on insulin: Notify the staff so that you can be scheduled 1st case in the morning. If your diabetes requires high dose insulin, take only  of your normal insulin dose the morning of the procedure and notify the staff that you have done so. Preventing infections: Shower with an antibacterial soap the morning of your procedure. Build-up your immune system: Take 1000 mg of Vitamin C with every meal (3 times a day) the day prior to your procedure. Antibiotics: Inform the staff if you have a condition or reason that requires you to take antibiotics before dental procedures. Pregnancy: If you are pregnant, call and cancel the procedure. Sickness: If  you have a cold, fever, or any active infections, call and cancel the procedure. Arrival: You must be in the facility at least 30 minutes prior to your scheduled procedure. Children: Do not bring children with you. Dress appropriately: There is always the possibility that your clothing may get soiled. Valuables: Do not bring any jewelry or valuables.  Reasons to call and reschedule or cancel your procedure: (Following these recommendations will minimize the risk of a serious complication.) Surgeries: Avoid having procedures within 2 weeks of any surgery. (Avoid for 2 weeks before or after any surgery). Flu Shots: Avoid having procedures within 2 weeks of a flu shots. (Avoid for 2 weeks before or after immunizations). Barium: Avoid having a procedure within 7-10 days after having had a radiological study involving the use of radiological contrast. (Myelograms, Barium swallow or enema study). Heart attacks: Avoid any elective procedures or surgeries for the initial 6 months after a "Myocardial Infarction" (Heart Attack). Blood thinners: It is imperative that you stop these medications before procedures. Let us know if you if you take any blood thinner.  Infection: Avoid procedures during or within two weeks of an infection (including chest colds or gastrointestinal problems). Symptoms associated with infections include: Localized redness, fever, chills, night sweats or profuse sweating, burning sensation when voiding, cough, congestion, stuffiness, runny nose, sore throat, diarrhea, nausea, vomiting, cold or Flu symptoms, recent or current infections. It is specially important if the infection is over the area that we intend to treat. Heart and lung problems: Symptoms that may suggest an active cardiopulmonary problem include: cough, chest pain, breathing difficulties or shortness of breath, dizziness, ankle swelling, uncontrolled high or unusually low blood pressure, and/or palpitations. If you are    experiencing any of these symptoms, cancel your procedure and contact your primary care physician for an evaluation.  Remember:  Regular Business hours are:  Monday to Thursday 8:00 AM to 4:00 PM  Provider's Schedule: Sheamus Hasting, MD:  Procedure days: Tuesday and Thursday 7:30 AM to 4:00 PM  Bilal Lateef, MD:  Procedure days: Monday and Wednesday 7:30 AM to 4:00 PM ______________________________________________________________________  ____________________________________________________________________________________________  General Risks and Possible Complications  Patient Responsibilities: It is important that you read this as it is part of your informed consent. It is our duty to inform you of the risks and possible complications associated with treatments offered to you. It is your responsibility as a patient to read this and to ask questions about anything that is not clear or that you believe was not covered in this document.  Patient's Rights: You have the right to refuse treatment. You also have the right to change your mind, even after initially having agreed to have the treatment done. However, under this last option, if you wait until the last second to change your mind, you may be charged for the materials used up to that point.  Introduction: Medicine is not an exact science. Everything in Medicine, including the lack of treatment(s), carries the potential for danger, harm, or loss (which is by definition: Risk). In Medicine, a complication is a secondary problem, condition, or disease that can aggravate an already existing one. All treatments carry the risk of possible complications. The fact that a side effects or complications occurs, does not imply that the treatment was conducted incorrectly. It must be clearly understood that these can happen even when everything is done following the highest safety standards.  No treatment: You can choose not to proceed with the  proposed treatment alternative. The "PRO(s)" would include: avoiding the risk of complications associated with the therapy. The "CON(s)" would include: not getting any of the treatment benefits. These benefits fall under one of three categories: diagnostic; therapeutic; and/or palliative. Diagnostic benefits include: getting information which can ultimately lead to improvement of the disease or symptom(s). Therapeutic benefits are those associated with the successful treatment of the disease. Finally, palliative benefits are those related to the decrease of the primary symptoms, without necessarily curing the condition (example: decreasing the pain from a flare-up of a chronic condition, such as incurable terminal cancer).  General Risks and Complications: These are associated to most interventional treatments. They can occur alone, or in combination. They fall under one of the following six (6) categories: no benefit or worsening of symptoms; bleeding; infection; nerve damage; allergic reactions; and/or death. No benefits or worsening of symptoms: In Medicine there are no guarantees, only probabilities. No healthcare provider can ever guarantee that a medical treatment will work, they can only state the probability that it may. Furthermore, there is always the possibility that the condition may worsen, either directly, or indirectly, as a consequence of the treatment. Bleeding: This is more common if the patient is taking a blood thinner, either prescription or over the counter (example: Goody Powders, Fish oil, Aspirin, Garlic, etc.), or if suffering a condition associated with impaired coagulation (example: Hemophilia, cirrhosis of the liver, low platelet counts, etc.). However, even if you do not have one on these, it can still happen. If you have any of these conditions, or take one of these drugs, make sure to notify your treating physician. Infection: This is more common in patients with a compromised  immune system, either due to disease (example:   diabetes, cancer, human immunodeficiency virus [HIV], etc.), or due to medications or treatments (example: therapies used to treat cancer and rheumatological diseases). However, even if you do not have one on these, it can still happen. If you have any of these conditions, or take one of these drugs, make sure to notify your treating physician. Nerve Damage: This is more common when the treatment is an invasive one, but it can also happen with the use of medications, such as those used in the treatment of cancer. The damage can occur to small secondary nerves, or to large primary ones, such as those in the spinal cord and brain. This damage may be temporary or permanent and it may lead to impairments that can range from temporary numbness to permanent paralysis and/or brain death. Allergic Reactions: Any time a substance or material comes in contact with our body, there is the possibility of an allergic reaction. These can range from a mild skin rash (contact dermatitis) to a severe systemic reaction (anaphylactic reaction), which can result in death. Death: In general, any medical intervention can result in death, most of the time due to an unforeseen complication. ____________________________________________________________________________________________  

## 2021-08-24 ENCOUNTER — Ambulatory Visit: Payer: Medicare HMO | Attending: Pain Medicine

## 2021-08-24 DIAGNOSIS — R278 Other lack of coordination: Secondary | ICD-10-CM | POA: Insufficient documentation

## 2021-08-24 DIAGNOSIS — M6289 Other specified disorders of muscle: Secondary | ICD-10-CM | POA: Diagnosis not present

## 2021-08-24 DIAGNOSIS — M7918 Myalgia, other site: Secondary | ICD-10-CM | POA: Diagnosis not present

## 2021-08-24 NOTE — Therapy (Signed)
OUTPATIENT PHYSICAL THERAPY FEMALE PELVIC EVALUATION   Patient Name: Angela Espinoza MRN: [id number] DOB:09-17-1955, 66 y.o., female Today's Date: 08/24/2021   PT End of Session - 08/24/21 1058     Visit Number 1    Number of Visits 12    Date for PT Re-Evaluation 11/16/21    Authorization Type IE: 08/24/21    PT Start Time 1100    PT Stop Time 1140    PT Time Calculation (min) 40 min    Activity Tolerance Patient tolerated treatment well             Past Medical History:  Diagnosis Date   Anxiety    Asthma    NO INHALER-SMOKE AND PERFUME TRIGGERS ASTHMA   Depression    Hyperlipidemia    Hypertension    IBS (irritable bowel syndrome)    Past Surgical History:  Procedure Laterality Date   ABDOMINAL HYSTERECTOMY  age 66   AXILLARY LYMPH NODE BIOPSY Right 08/03/2014   Procedure: EXCISION RIGHT  AXILLARY LIPOMATOSIS;  Surgeon: Nadeen LandauJarvis Wilton Smith, MD;  Location: [facility];  Service: General;  Laterality: Right;   BREAST BIOPSY Left 10/07/2018   Stereo, Coil Clip, BENIGN BREAST TISSUE WITH AREAS CONTAINING A PREDOMINANCE OF ADIPOSE TISSUE   BREAST BIOPSY Left 10/21/2019   stereo, ribbon clip, path pending    COLONOSCOPY     DIAGNOSTIC LAPAROSCOPY     ESOPHAGOGASTRODUODENOSCOPY (EGD) WITH PROPOFOL N/A 01/26/2021   Procedure: ESOPHAGOGASTRODUODENOSCOPY (EGD) WITH PROPOFOL;  Surgeon: Regis BillLocklear, Cameron T, MD;  Location: [facility];  Service: Endoscopy;  Laterality: N/A;   ESOPHAGOGASTRODUODENOSCOPY (EGD) WITH PROPOFOL N/A 04/17/2021   Procedure: ESOPHAGOGASTRODUODENOSCOPY (EGD) WITH PROPOFOL;  Surgeon: Regis BillLocklear, Cameron T, MD;  Location: [facility];  Service: Endoscopy;  Laterality: N/A;   ESOPHAGOGASTRODUODENOSCOPY (EGD) WITH PROPOFOL N/A 05/02/2021   Procedure: ESOPHAGOGASTRODUODENOSCOPY (EGD) WITH PROPOFOL;  Surgeon: Regis BillLocklear, Cameron T, MD;  Location: [facility];  Service: Endoscopy;  Laterality: N/A;   ESOPHAGOGASTRODUODENOSCOPY (EGD) WITH PROPOFOL N/A  06/06/2021   Procedure: ESOPHAGOGASTRODUODENOSCOPY (EGD) WITH PROPOFOL;  Surgeon: Regis BillLocklear, Cameron T, MD;  Location: [facility];  Service: Endoscopy;  Laterality: N/A;   EXPLORATORY LAPAROTOMY     FOOT SURGERY     KYPHOPLASTY N/A 12/10/2014   Procedure: KYPHOPLASTY L 1;  Surgeon: Kennedy BuckerMichael Menz, MD;  Location: [facility];  Service: Orthopedics;  Laterality: N/A;   OOPHORECTOMY     TONSILLECTOMY     Patient Active Problem List   Diagnosis Date Noted   Gluteal pain (Left) 08/21/2021   Muscle pain, myofascial (Left buttocks) 08/21/2021   Seasonal allergic rhinitis due to pollen 06/15/2021   Duodenal ulcer 06/15/2021   Pyloric stenosis 06/15/2021   Syncope and collapse 01/26/2021   History of GI bleed 01/26/2021   Other intervertebral disc degeneration, thoracic region 12/27/2020   Other intervertebral disc degeneration, lumbar region 12/27/2020   Abnormal mammogram of left breast 09/24/2019   Acid reflux 12/03/2018   Neurogenic pain 11/03/2018   Thoracic facet syndrome (Left) 09/30/2018   Spondylosis without myelopathy or radiculopathy, lumbosacral region 09/30/2018   Spondylosis without myelopathy or radiculopathy, thoracic region 09/30/2018   DDD (degenerative disc disease), thoracolumbar 09/30/2018   DDD (degenerative disc disease), lumbosacral 07/24/2018   History of L1 kyphoplasty 01/01/2018   Traumatic compression fracture of L1 lumbar vertebra, sequela (2016) 01/01/2018   Spondylosis without myelopathy or radiculopathy, lumbar region 01/01/2018   Lumbar facet syndrome (Left) 01/01/2018   Allergy 12/10/2017   Anxiety 12/10/2017   Asthma without status asthmaticus 12/10/2017  Depression 12/10/2017   Hypertension 12/10/2017   Chronic low back pain (1ry area of Pain) (Left) w/o sciatica 12/10/2017   Chronic pain syndrome 12/10/2017   Pharmacologic therapy 12/10/2017   Disorder of skeletal system 12/10/2017   Problems influencing health status 12/10/2017   Postmenopause  06/02/2014    PCP: Berniece Salines, FNP  REFERRING PROVIDER: Delano Metz, MD   REFERRING DIAG:  M79.18 (ICD-10-CM) - Gluteal pain  M79.18 (ICD-10-CM) - Muscle pain, myofascial   THERAPY DIAG:  Gluteal pain  Pelvic floor dysfunction  Other lack of coordination  Rationale for Evaluation and Treatment: Rehabilitation  ONSET DATE: About a month ago   RED FLAGS: N/A  Have you had any night sweats? Unexplained weight loss? Saddle anesthesia? Unexplained changes in bowel or bladder habits?   SUBJECTIVE:                                                                                                                                                                                  PRECAUTIONS: None  WEIGHT BEARING RESTRICTIONS: No  FALLS:  Has patient fallen in last 6 months? No  OCCUPATION/SOCIAL ACTIVITIES: Retired, being with grandchildren, have an elliptical/bike, walking    PLOF: Independent  PERTINENT HISTORY/CHART REVIEW: Hospitalization due to bleeding ulcer in Jan 26, 2022.    CHIEF CONCERN: Pt is having pain at the buttocks specifically on the L side. Pt has a hx of back pain but none currently. Pt had a hospitalization Jan 26, 2022 and had significant loss a lot of weight. Pt had tailbone pain in Feb/March. Pt bought donut pillow and it helps but not really. N/T down the L leg.    PAIN:  Are you having pain? Yes NPRS scale: 7/10 (current), 9/10 (worst) Pain location: L buttocks   Pain type: tingling, aching, burning  Pain description: constant, burning, and tingling   Aggravating factors: sitting (30-40 mins), standing transfer,  Relieving factors: ice/heat, stretches from low back    LIVING ENVIRONMENT: Lives with: lives with their spouse Lives in: House/apartment   PATIENT GOALS: Pt would like to use the elliptical, walking, and decreasing your pain     GASTROINTESTINAL HISTORY Pain with bowel movement: No Toileting posture: feet flat   Type of bowel movement:Strain No Frequency: 1x/day on average Fully empty rectum: Yes  Fiber supplement: takes Miralax   SEXUAL HISTORY/FUNCTION Pain with intercourse: During Penetration Ability to have vaginal penetration:  Yes; Deep thrusting: Yes, but painful, 6/10 Able to achieve orgasm?: I don't know   OBSTETRICAL HISTORY Vaginal deliveries: G2P2   GYNECOLOGICAL HISTORY Hysterectomy: abdominal hysterectomy  Pelvic Organ Prolapse: None Pain with exam: yes no Heaviness/pressure: yes no   OBJECTIVE:  DIAGNOSTIC TESTING/IMPRESSIONS:  DG Lumbar Spine Complete CLINICAL DATA:  Low back pain.   EXAM: LUMBAR SPINE - COMPLETE 4+ VIEW   COMPARISON:  November 28, 2020   FINDINGS: There is no evidence of lumbar spine fracture. Status post prior vertebroplasty of L1 unchanged. No significant degenerative joint changes are noted. Alignment is normal. Intervertebral disc spaces are maintained.   IMPRESSION: No acute fracture or dislocation.  Prior L1 vertebroplasty unchanged   Electronically Signed   By: Sherian Rein M.D. On: 08/15/2021 11:35       COGNITION: Overall cognitive status: Within functional limits for tasks assessed     POSTURE:  Increased adducted hip B in sitting (increase PFM tension)   Thoracic kyphosis: slightly in seated in standing Iliac crest height: L iliac higher Pelvic obliquity: WNL   GAIT: Deferred 2/2 time constraints  Distance walked:  Comments:   Trendelenburg:   SENSATION: Deferred 2/2 time constraints  Light touch: , L2-S2 dermatomes  Proprioception:    RANGE OF MOTION: Deferred 2/2 time constraints   (Norm range in degrees)  LEFT  RIGHT   Lumbar forward flexion (65):      Lumbar extension (30):     Lumbar lateral flexion (25):     Thoracic and Lumbar rotation (30 degrees):       Hip Flexion (0-125):      Hip IR (0-45):     Hip ER (0-45):     Hip Adduction:      Hip Abduction (0-40):     Hip extension (0-15):      (*= pain, Blank rows = not tested)   STRENGTH: MMT  Deferred 2/2 time constraints   RLE  LLE  Hip Flexion    Hip Extension    Hip Abduction     Hip Adduction     Hip ER     Hip IR     Knee Extension    Knee Flexion    Dorsiflexion     Plantarflexion (seated)    (*= pain, Blank rows = not tested)   SPECIAL TESTS:Deferred the rest 2/2 time constraints  Slump (SN 83, -LR 0.32): negative B, increased hamstring tightness SLR (SN 92, -LR 0.29): negative B, increased hamstring tightness   FABER (SN 81): FADIR (SN 94):   PALPATION: Deferred 2/2 time constraints  Abdominal:  Diastasis:  finger above umbilicus,  fingers at and below umbilicus  Scar mobility: present/mobile perpendicular, parallel Rib flare: present/absent  EXTERNAL PELVIC EXAM: Patient educated on the purpose of the pelvic exam and articulated understanding; patient consented to the exam verbally. Deferred 2/2 time constraints  Palpation: Breath coordination: present/absent/inconsistent Voluntary Contraction: present/absent Relaxation: full/delayed/non-relaxing Perineal movement with sustained IAP increase ("bear down"): descent/no change/elevation/excessive descent Perineal movement with rapid IAP increase ("cough"): elevation/no change/descent Pubic symphysis: (0= no contraction, 1= flicker, 2= weak squeeze, 3= fair squeeze with lift, 4= good squeeze and lift against resistance, 5= strong squeeze against strong resistance)   INTERNAL PELVIC EXAM: Patient educated on the purpose of the pelvic exam and articulated understanding; patient consented to the exam verbally. Deferred 2/2 to time constraints Introitus Appears:  Skin integrity:  Scar mobility: Strength (PERF):  Symmetry: Palpation: Prolapse: (0= no contraction, 1= flicker, 2= weak squeeze, 3= fair squeeze with lift, 4= good squeeze and lift against resistance, 5= strong squeeze against strong resistance)    Physical assessment today - posture,  brief look at tailbone in sitting, special tests   Patient Education:  Patient educated on what to  expect during course of physical therapy, POC, and provided with HEP including: toileting posture handout. Discussion on length of time it takes to see improvements in ROM, pain, and strength in the LE. Patient verbalized understanding. Patient will benefit from further education in order to maximize compliance and understanding for long-term therapeutic gains.   Patient Surveys:  FOTO PFDI Pain - 21    ASSESSMENT:  Clinical Impression: Patient is a 66 y.o. who was seen today for physical therapy evaluation and treatment for a chief concern of gluteal pain. Today's evaluation suggest deficits in pain, posture, PFM coordination, ROM, and PFM extensibility as evidenced by worst pain at L gluteal area 9/10 (NPRS scale) described as achy/burning/tingling, pain at tailbone area (uses donut pillow), unable to sit longer than 30 mins, pain at gluteal area with STS transfers, pain with penetrative sex (6/10) and hx of constipation (IBS). Upon brief physical examination, Pt demonstrates deficits in posture and ROM as evidenced by increased L iliac crest height, increased L shoulder height, tenderness upon palpation of coccyx in sitting, and hypomobility at coccyx with sustained IAP increase ("bear down"). Patient's responses on FOTO PFDI Pain (21) indicates minimal limitation/disability/distress. Patient's progress may be limited due to external stressors; however, patient's motivation is advantageous. Pt with basic understanding of PFM function in bowel/bladder habits, sexual function, posture, and the diaphragm. Patient will benefit from skilled therapeutic intervention to address deficits in pain, posture, PFM coordination, ROM, and PFM extensibility in order to increase PLOF and improve overall QOL.    Objective Impairments: decreased coordination, decreased mobility, decreased ROM, hypomobility, improper  body mechanics, postural dysfunction, and pain.   Activity Limitations: sitting, standing, transfers, toileting, and locomotion level  Personal Factors: Age, Behavior pattern, Past/current experiences, and 3+ comorbidities: IBS, HTN, anxiety/depression  are also affecting patient's functional outcome.   Rehab Potential: Good  Clinical Decision Making: Evolving/moderate complexity  Evaluation Complexity: Moderate   GOALS: Goals reviewed with patient? Yes  SHORT TERM GOALS: Target date: 10/05/2021  Patient will demonstrate independent and coordinated diaphragmatic breathing in supine with a 1:2 breathing pattern for improved down-regulation of the nervous system and improved management of intra-abdominal pressures in order to increase function at home and in the community. Baseline: will assess next visit  Goal status: INITIAL    LONG TERM GOALS: Target date: 11/16/2021   Patient will decrease worst pain as reported on NPRS by at least 2 points to demonstrate clinically significant reduction in pain in order to restore/improve function and overall QOL. Baseline: 9/10 at L gluteal  Goal status: INITIAL  2.  Patient will report being able to return to activities including, but not limited to: walking, traveling, sitting for prolonged time, and penetrative sex without pain or limitation to indicate complete resolution of the chief concern and return to prior level of participation at home and in the community. Baseline: has pain with all of the above (donut pillow helps) Goal status: INITIAL  3.  Patient will be able to demonstrate improved postural alignment in standing and sitting and overall improved muscular strength in order to reduce pain and complete resolution of pelvic floor dysfunction.  Baseline: L iliac crest higher/L shoulder higher, hypomobile coccyx Goal status: INITIAL  4.  Patient will be able to sit for at least 30 minutes or longer with or without use of donut pillow  in order to demonstrate improved reduction in pain and PFM extensibility/corrdination in order to participate fully in the home and in the community.  Baseline: can  sit for 30 mins but has to have pillow with her (she brings it to most places) Goal status: INITIAL  5.  Patient will be independent in the performance of a HEP in order to participate fully, without limitation or disruptions, in activities at home and in the community.  Baseline: toileting posture handout Goal status: INITIAL   PLAN: PT Frequency: 1x/week  PT Duration: 12 weeks  Planned Interventions: Therapeutic exercises, Therapeutic activity, Neuromuscular re-education, Balance training, Gait training, Patient/Family education, Self Care, Joint mobilization, Spinal mobilization, Cryotherapy, Moist heat, scar mobilization, Taping, and Manual therapy  Plan For Next Session: phys assess, manual?   Neya Creegan, PT, DPT  08/24/2021, 11:59 AM

## 2021-08-28 ENCOUNTER — Ambulatory Visit: Payer: Medicare HMO

## 2021-08-28 DIAGNOSIS — M6289 Other specified disorders of muscle: Secondary | ICD-10-CM | POA: Diagnosis not present

## 2021-08-28 DIAGNOSIS — M7918 Myalgia, other site: Secondary | ICD-10-CM

## 2021-08-28 DIAGNOSIS — R278 Other lack of coordination: Secondary | ICD-10-CM | POA: Diagnosis not present

## 2021-08-28 NOTE — Therapy (Signed)
OUTPATIENT PHYSICAL THERAPY FEMALE PELVIC TREATMENT   Patient Name: Angela Espinoza MRN: [id number] DOB:03-Sep-1955, 66 y.o., female Today's Date: 08/28/2021   PT End of Session - 08/28/21 1355     Visit Number 2    Number of Visits 12    Date for PT Re-Evaluation 11/16/21    PT Start Time 1400    PT Stop Time 1440    PT Time Calculation (min) 40 min    Activity Tolerance Patient tolerated treatment well             Past Medical History:  Diagnosis Date   Anxiety    Asthma    NO INHALER-SMOKE AND PERFUME TRIGGERS ASTHMA   Depression    Hyperlipidemia    Hypertension    IBS (irritable bowel syndrome)    Past Surgical History:  Procedure Laterality Date   ABDOMINAL HYSTERECTOMY  age 82   AXILLARY LYMPH NODE BIOPSY Right 08/03/2014   Procedure: EXCISION RIGHT  AXILLARY LIPOMATOSIS;  Surgeon: Nadeen Landau, MD;  Location: [facility];  Service: General;  Laterality: Right;   BREAST BIOPSY Left 10/07/2018   Stereo, Coil Clip, BENIGN BREAST TISSUE WITH AREAS CONTAINING A PREDOMINANCE OF ADIPOSE TISSUE   BREAST BIOPSY Left 10/21/2019   stereo, ribbon clip, path pending    COLONOSCOPY     DIAGNOSTIC LAPAROSCOPY     ESOPHAGOGASTRODUODENOSCOPY (EGD) WITH PROPOFOL N/A 01/26/2021   Procedure: ESOPHAGOGASTRODUODENOSCOPY (EGD) WITH PROPOFOL;  Surgeon: Regis Bill, MD;  Location: [facility];  Service: Endoscopy;  Laterality: N/A;   ESOPHAGOGASTRODUODENOSCOPY (EGD) WITH PROPOFOL N/A 04/17/2021   Procedure: ESOPHAGOGASTRODUODENOSCOPY (EGD) WITH PROPOFOL;  Surgeon: Regis Bill, MD;  Location: [facility];  Service: Endoscopy;  Laterality: N/A;   ESOPHAGOGASTRODUODENOSCOPY (EGD) WITH PROPOFOL N/A 05/02/2021   Procedure: ESOPHAGOGASTRODUODENOSCOPY (EGD) WITH PROPOFOL;  Surgeon: Regis Bill, MD;  Location: [facility];  Service: Endoscopy;  Laterality: N/A;   ESOPHAGOGASTRODUODENOSCOPY (EGD) WITH PROPOFOL N/A 06/06/2021   Procedure:  ESOPHAGOGASTRODUODENOSCOPY (EGD) WITH PROPOFOL;  Surgeon: Regis Bill, MD;  Location: [facility];  Service: Endoscopy;  Laterality: N/A;   EXPLORATORY LAPAROTOMY     FOOT SURGERY     KYPHOPLASTY N/A 12/10/2014   Procedure: KYPHOPLASTY L 1;  Surgeon: Kennedy Bucker, MD;  Location: [facility];  Service: Orthopedics;  Laterality: N/A;   OOPHORECTOMY     TONSILLECTOMY     Patient Active Problem List   Diagnosis Date Noted   Gluteal pain (Left) 08/21/2021   Muscle pain, myofascial (Left buttocks) 08/21/2021   Seasonal allergic rhinitis due to pollen 06/15/2021   Duodenal ulcer 06/15/2021   Pyloric stenosis 06/15/2021   Syncope and collapse 01/26/2021   History of GI bleed 01/26/2021   Other intervertebral disc degeneration, thoracic region 12/27/2020   Other intervertebral disc degeneration, lumbar region 12/27/2020   Abnormal mammogram of left breast 09/24/2019   Acid reflux 12/03/2018   Neurogenic pain 11/03/2018   Thoracic facet syndrome (Left) 09/30/2018   Spondylosis without myelopathy or radiculopathy, lumbosacral region 09/30/2018   Spondylosis without myelopathy or radiculopathy, thoracic region 09/30/2018   DDD (degenerative disc disease), thoracolumbar 09/30/2018   DDD (degenerative disc disease), lumbosacral 07/24/2018   History of L1 kyphoplasty 01/01/2018   Traumatic compression fracture of L1 lumbar vertebra, sequela (2016) 01/01/2018   Spondylosis without myelopathy or radiculopathy, lumbar region 01/01/2018   Lumbar facet syndrome (Left) 01/01/2018   Allergy 12/10/2017   Anxiety 12/10/2017   Asthma without status asthmaticus 12/10/2017   Depression 12/10/2017   Hypertension  12/10/2017   Chronic low back pain (1ry area of Pain) (Left) w/o sciatica 12/10/2017   Chronic pain syndrome 12/10/2017   Pharmacologic therapy 12/10/2017   Disorder of skeletal system 12/10/2017   Problems influencing health status 12/10/2017   Postmenopause 06/02/2014    PCP: Bo Merino, FNP  REFERRING PROVIDER: Milinda Pointer, MD   REFERRING DIAG:  M79.18 (ICD-10-CM) - Gluteal pain  M79.18 (ICD-10-CM) - Muscle pain, myofascial   THERAPY DIAG:  Gluteal pain  Pelvic floor dysfunction  Other lack of coordination  Rationale for Evaluation and Treatment: Rehabilitation  ONSET DATE: About a month ago                                                                                                                                                                            PRECAUTIONS: None  WEIGHT BEARING RESTRICTIONS: No  FALLS:  Has patient fallen in last 6 months? No  OCCUPATION/SOCIAL ACTIVITIES: Retired, being with grandchildren, have an elliptical/bike, walking     PERTINENT HISTORY/CHART REVIEW: Hospitalization due to bleeding ulcer in Jan 26, 2022.    CHIEF CONCERN: Pt is having pain at the buttocks specifically on the L side. Pt has a hx of back pain but none currently. Pt had a hospitalization Jan 26, 2022 and had significant loss a lot of weight. Pt had tailbone pain in Feb/March. Pt bought donut pillow and it helps but not really. N/T down the L leg.  Pain type: tingling, aching, burning  Pain description: constant, burning, and tingling   Aggravating factors: sitting (30-40 mins), standing transfer,  Relieving factors: ice/heat, stretches from low back    PATIENT GOALS: Pt would like to use the elliptical, walking, and decreasing your pain    GASTROINTESTINAL HISTORY Pain with bowel movement: No Toileting posture: feet flat  Type of bowel movement:Strain No Frequency: 1x/day on average Fully empty rectum: Yes  Fiber supplement: takes Miralax   SEXUAL HISTORY/FUNCTION Pain with intercourse: During Penetration Ability to have vaginal penetration:  Yes; Deep thrusting: Yes, but painful, 6/10 Able to achieve orgasm?: I don't know   OBSTETRICAL HISTORY Vaginal deliveries: G2P2   GYNECOLOGICAL HISTORY Hysterectomy: abdominal  hysterectomy  Pelvic Organ Prolapse: None Pain with exam: yes no Heaviness/pressure: yes no   SUBJECTIVE:        Pt has an episode of GERD and does not feel comfortable. Pt has no significant changes from last session.   PAIN:  Are you having pain? Yes NPRS scale: 3/10 Pain location: L buttocks      OBJECTIVE:   TODAY'S TREATMENT:  Pre-treatment assessment     COGNITION: Overall cognitive status: Within functional limits for tasks assessed   POSTURE: (08/24/21) Increased adducted  hip B in sitting (increase PFM tension)   Thoracic kyphosis: slightly in seated in standing Iliac crest height: L iliac higher Pelvic obliquity: WNL   RANGE OF MOTION:  (Norm range in degrees)  LEFT 08/28/21 RIGHT 08/28/21  Lumbar forward flexion (65):  WNL    Lumbar extension (30): WNL    Lumbar lateral flexion (25):  WNL WNL  Thoracic and Lumbar rotation (30 degrees):    Restricted Restricted   Hip Flexion (0-125):   WNL WNL  Hip IR (0-45):  Restricted Restricted  Hip ER (0-45):  WNL WNL  Hip Adduction:      Hip Abduction (0-40):  WNL WNL  Hip extension (0-15):     (*= pain, Blank rows = not tested)   STRENGTH: MMT    RLE 08/28/21 LLE 08/28/21  Hip Flexion 5 5  Hip Extension 4 4  Hip Abduction  4 4  Hip Adduction     Hip ER  5 5  Hip IR  5 5  Knee Extension 5 5  Knee Flexion 4 4  Dorsiflexion     Plantarflexion (seated) 5 5  (*= pain, Blank rows = not tested)   SPECIAL TESTS: (08/24/21) Slump (SN 83, -LR 0.32): negative B, increased hamstring tightness SLR (SN 92, -LR 0.29): negative B, increased hamstring tightness   Today: FABER (SN 81): negative B FADIR (SN 94): negative B   PALPATION:  Spinal:  Movement of SIJ in flexion and extension sidelying - WNL  L5-S1 - hypomobile  SIJ - hypomobile B  Ligamentous structures B with increased tension upon palpation, increased tenderness at tailbone and to the L at muscular/ligamentous structures   EXTERNAL PELVIC  EXAM: Patient educated on the purpose of the pelvic exam and articulated understanding; patient consented to the exam verbally. Deferred 2/2 time constraints  Palpation: Breath coordination: present/absent/inconsistent Voluntary Contraction: present/absent Relaxation: full/delayed/non-relaxing Perineal movement with sustained IAP increase ("bear down"): descent/no change/elevation/excessive descent Perineal movement with rapid IAP increase ("cough"): elevation/no change/descent Pubic symphysis: (0= no contraction, 1= flicker, 2= weak squeeze, 3= fair squeeze with lift, 4= good squeeze and lift against resistance, 5= strong squeeze against strong resistance)   Manual Therapy: Prone L5-S1 and along SIJ bilaterally mobs - Grade II for improved spinal/SIJ mobility  Prone STM with movement (hip IR) for improved muscular/ligamentous tension and pain modulation  Neuromuscular Re-education: Prone hip IR with coordinated breath for pain modulation   Right sidelying thoracic rotation, x10, with coordinated breath for improved fascial sling lengthening, VCs and TCs   Discussion and demonstration on log roll technique for improved IAP management and to decrease pain towards coccyx   Patient response to interventions: Pt felt the hip IR prone really helped    Patient Education:  Patient provided with HEP including: prone hip IR with coordinated breath and R sidelying thoracic rotation. Patient educated throughout session on appropriate technique and form using multi-modal cueing, HEP, and activity modification. Patient will benefit from further education in order to maximize compliance and understanding for long-term therapeutic gains.    ASSESSMENT:  Clinical Impression: Patient with excellent motivation to participate in today's session. Pt with 3/10 L gluteal pain in sitting. Upon physical examination, Pt demonstrates deficits in pain, posture, ROM, and LE strength as evidenced by increased  L iliac crest height (from IE), increased L shoulder height (from IE), tenderness upon palpation of the coccyx specifically to the L ligamentous/muscular structures, restricted thoracic/lumbar ROM, restricted B hip IR ROM, 4/5 MMT B hip ext/abd/knee flexion,  hypomobility of L5-S1 as well as across bilateral SIJ with increased tenderness/discomfort at L SIJ. Will assess PFM external next visit. After manual interventions, Pt felt a decrease in tenderness at the tailbone and surrounding structures, although still present. Pt required moderate VCs and TCs throughout session for proper technique and to decrease bodily compensations. Pt responded well to all manual, active, and educational interventions. Patient will benefit from skilled therapeutic intervention to address deficits in pain, posture, ROM, and LE strength, PFM coordination, and PFM extensibility in order to increase PLOF and improve overall QOL.    Objective Impairments: decreased coordination, decreased mobility, decreased ROM, hypomobility, improper body mechanics, postural dysfunction, and pain.   Activity Limitations: sitting, standing, transfers, toileting, and locomotion level  Personal Factors: Age, Behavior pattern, Past/current experiences, and 3+ comorbidities: IBS, HTN, anxiety/depression  are also affecting patient's functional outcome.   Rehab Potential: Good  Clinical Decision Making: Evolving/moderate complexity  Evaluation Complexity: Moderate   GOALS: Goals reviewed with patient? Yes  SHORT TERM GOALS: Target date: 10/09/2021  Patient will demonstrate independent and coordinated diaphragmatic breathing in supine with a 1:2 breathing pattern for improved down-regulation of the nervous system and improved management of intra-abdominal pressures in order to increase function at home and in the community. Baseline: will assess next visit  Goal status: INITIAL    LONG TERM GOALS: Target date: 11/20/2021   Patient  will decrease worst pain as reported on NPRS by at least 2 points to demonstrate clinically significant reduction in pain in order to restore/improve function and overall QOL. Baseline: 9/10 at L gluteal  Goal status: INITIAL  2.  Patient will report being able to return to activities including, but not limited to: walking, traveling, sitting for prolonged time, and penetrative sex without pain or limitation to indicate complete resolution of the chief concern and return to prior level of participation at home and in the community. Baseline: has pain with all of the above (donut pillow helps) Goal status: INITIAL  3.  Patient will be able to demonstrate improved postural alignment in standing and sitting and overall improved muscular strength in order to reduce pain and complete resolution of pelvic floor dysfunction.  Baseline: L iliac crest higher/L shoulder higher, hypomobile coccyx Goal status: INITIAL  4.  Patient will be able to sit for at least 30 minutes or longer with or without use of donut pillow in order to demonstrate improved reduction in pain and PFM extensibility/corrdination in order to participate fully in the home and in the community.  Baseline: can sit for 30 mins but has to have pillow with her (she brings it to most places) Goal status: INITIAL  5.  Patient will be independent in the performance of a HEP in order to participate fully, without limitation or disruptions, in activities at home and in the community.  Baseline: toileting posture handout Goal status: INITIAL   PLAN: PT Frequency: 1x/week  PT Duration: 12 weeks  Planned Interventions: Therapeutic exercises, Therapeutic activity, Neuromuscular re-education, Balance training, Gait training, Patient/Family education, Self Care, Joint mobilization, Spinal mobilization, Cryotherapy, Moist heat, scar mobilization, Taping, and Manual therapy  Plan For Next Session: PFM external, manual at SIJ, coccyx mobility     Gunner Iodice, PT, DPT  08/28/2021, 1:56 PM

## 2021-09-05 ENCOUNTER — Ambulatory Visit: Payer: Medicare HMO | Attending: Pain Medicine

## 2021-09-05 DIAGNOSIS — R278 Other lack of coordination: Secondary | ICD-10-CM | POA: Diagnosis not present

## 2021-09-05 DIAGNOSIS — M6289 Other specified disorders of muscle: Secondary | ICD-10-CM | POA: Insufficient documentation

## 2021-09-05 DIAGNOSIS — M7918 Myalgia, other site: Secondary | ICD-10-CM | POA: Insufficient documentation

## 2021-09-05 NOTE — Therapy (Signed)
OUTPATIENT PHYSICAL THERAPY FEMALE PELVIC TREATMENT   Patient Name: Angela Espinoza MRN: [id number] DOB:November 15, 1955, 66 y.o., female Today's Date: 09/05/2021   PT End of Session - 09/05/21 1311     Visit Number 3    Number of Visits 12    Date for PT Re-Evaluation 11/16/21    PT Start Time 1315    PT Stop Time 1355    PT Time Calculation (min) 40 min    Activity Tolerance Patient tolerated treatment well             Past Medical History:  Diagnosis Date   Anxiety    Asthma    NO INHALER-SMOKE AND PERFUME TRIGGERS ASTHMA   Depression    Hyperlipidemia    Hypertension    IBS (irritable bowel syndrome)    Past Surgical History:  Procedure Laterality Date   ABDOMINAL HYSTERECTOMY  age 86   AXILLARY LYMPH NODE BIOPSY Right 08/03/2014   Procedure: EXCISION RIGHT  AXILLARY LIPOMATOSIS;  Surgeon: Nadeen Landau, MD;  Location: [facility];  Service: General;  Laterality: Right;   BREAST BIOPSY Left 10/07/2018   Stereo, Coil Clip, BENIGN BREAST TISSUE WITH AREAS CONTAINING A PREDOMINANCE OF ADIPOSE TISSUE   BREAST BIOPSY Left 10/21/2019   stereo, ribbon clip, path pending    COLONOSCOPY     DIAGNOSTIC LAPAROSCOPY     ESOPHAGOGASTRODUODENOSCOPY (EGD) WITH PROPOFOL N/A 01/26/2021   Procedure: ESOPHAGOGASTRODUODENOSCOPY (EGD) WITH PROPOFOL;  Surgeon: Regis Bill, MD;  Location: [facility];  Service: Endoscopy;  Laterality: N/A;   ESOPHAGOGASTRODUODENOSCOPY (EGD) WITH PROPOFOL N/A 04/17/2021   Procedure: ESOPHAGOGASTRODUODENOSCOPY (EGD) WITH PROPOFOL;  Surgeon: Regis Bill, MD;  Location: [facility];  Service: Endoscopy;  Laterality: N/A;   ESOPHAGOGASTRODUODENOSCOPY (EGD) WITH PROPOFOL N/A 05/02/2021   Procedure: ESOPHAGOGASTRODUODENOSCOPY (EGD) WITH PROPOFOL;  Surgeon: Regis Bill, MD;  Location: [facility];  Service: Endoscopy;  Laterality: N/A;   ESOPHAGOGASTRODUODENOSCOPY (EGD) WITH PROPOFOL N/A 06/06/2021   Procedure:  ESOPHAGOGASTRODUODENOSCOPY (EGD) WITH PROPOFOL;  Surgeon: Regis Bill, MD;  Location: [facility];  Service: Endoscopy;  Laterality: N/A;   EXPLORATORY LAPAROTOMY     FOOT SURGERY     KYPHOPLASTY N/A 12/10/2014   Procedure: KYPHOPLASTY L 1;  Surgeon: Kennedy Bucker, MD;  Location: [facility];  Service: Orthopedics;  Laterality: N/A;   OOPHORECTOMY     TONSILLECTOMY     Patient Active Problem List   Diagnosis Date Noted   Gluteal pain (Left) 08/21/2021   Muscle pain, myofascial (Left buttocks) 08/21/2021   Seasonal allergic rhinitis due to pollen 06/15/2021   Duodenal ulcer 06/15/2021   Pyloric stenosis 06/15/2021   Syncope and collapse 01/26/2021   History of GI bleed 01/26/2021   Other intervertebral disc degeneration, thoracic region 12/27/2020   Other intervertebral disc degeneration, lumbar region 12/27/2020   Abnormal mammogram of left breast 09/24/2019   Acid reflux 12/03/2018   Neurogenic pain 11/03/2018   Thoracic facet syndrome (Left) 09/30/2018   Spondylosis without myelopathy or radiculopathy, lumbosacral region 09/30/2018   Spondylosis without myelopathy or radiculopathy, thoracic region 09/30/2018   DDD (degenerative disc disease), thoracolumbar 09/30/2018   DDD (degenerative disc disease), lumbosacral 07/24/2018   History of L1 kyphoplasty 01/01/2018   Traumatic compression fracture of L1 lumbar vertebra, sequela (2016) 01/01/2018   Spondylosis without myelopathy or radiculopathy, lumbar region 01/01/2018   Lumbar facet syndrome (Left) 01/01/2018   Allergy 12/10/2017   Anxiety 12/10/2017   Asthma without status asthmaticus 12/10/2017   Depression 12/10/2017   Hypertension  12/10/2017   Chronic low back pain (1ry area of Pain) (Left) w/o sciatica 12/10/2017   Chronic pain syndrome 12/10/2017   Pharmacologic therapy 12/10/2017   Disorder of skeletal system 12/10/2017   Problems influencing health status 12/10/2017   Postmenopause 06/02/2014    PCP: Bo Merino, FNP  REFERRING PROVIDER: Milinda Pointer, MD   REFERRING DIAG:  M79.18 (ICD-10-CM) - Gluteal pain  M79.18 (ICD-10-CM) - Muscle pain, myofascial   THERAPY DIAG:  Gluteal pain  Pelvic floor dysfunction  Other lack of coordination  Rationale for Evaluation and Treatment: Rehabilitation  ONSET DATE: About a month ago                                                                                                                                                                            PRECAUTIONS: None  WEIGHT BEARING RESTRICTIONS: No  FALLS:  Has patient fallen in last 6 months? No  OCCUPATION/SOCIAL ACTIVITIES: Retired, being with grandchildren, have an elliptical/bike, walking     PERTINENT HISTORY/CHART REVIEW: Hospitalization due to bleeding ulcer in Jan 26, 2022.    CHIEF CONCERN: Pt is having pain at the buttocks specifically on the L side. Pt has a hx of back pain but none currently. Pt had a hospitalization Jan 26, 2022 and had significant loss a lot of weight. Pt had tailbone pain in Feb/March. Pt bought donut pillow and it helps but not really. N/T down the L leg.  Pain type: tingling, aching, burning  Pain description: constant, burning, and tingling   Aggravating factors: sitting (30-40 mins), standing transfer,  Relieving factors: ice/heat, stretches from low back    PATIENT GOALS: Pt would like to use the elliptical, walking, and decreasing your pain    GASTROINTESTINAL HISTORY Pain with bowel movement: No Toileting posture: feet flat  Type of bowel movement:Strain No Frequency: 1x/day on average Fully empty rectum: Yes  Fiber supplement: takes Miralax   SEXUAL HISTORY/FUNCTION Pain with intercourse: During Penetration Ability to have vaginal penetration:  Yes; Deep thrusting: Yes, but painful, 6/10 Able to achieve orgasm?: I don't know   OBSTETRICAL HISTORY Vaginal deliveries: G2P2   GYNECOLOGICAL HISTORY Hysterectomy: abdominal  hysterectomy  Pelvic Organ Prolapse: None Pain with exam: yes no Heaviness/pressure: yes no   SUBJECTIVE:        Pt has been doing well and practicing exercises. Pt has been able to use squatty potty technique and it has helped a lot with bowel movements.   PAIN:  Are you having pain? No     COGNITION: Overall cognitive status: Within functional limits for tasks assessed   POSTURE: (08/24/21) Increased adducted hip B in sitting (increase PFM tension)   Thoracic kyphosis: slightly in  seated in standing Iliac crest height: L iliac higher Pelvic obliquity: WNL   RANGE OF MOTION:  (Norm range in degrees)  LEFT 08/28/21 RIGHT 08/28/21  Lumbar forward flexion (65):  WNL    Lumbar extension (30): WNL    Lumbar lateral flexion (25):  WNL WNL  Thoracic and Lumbar rotation (30 degrees):    Restricted Restricted   Hip Flexion (0-125):   WNL WNL  Hip IR (0-45):  Restricted Restricted  Hip ER (0-45):  WNL WNL  Hip Adduction:      Hip Abduction (0-40):  WNL WNL  Hip extension (0-15):     (*= pain, Blank rows = not tested)   STRENGTH: MMT    RLE 08/28/21 LLE 08/28/21  Hip Flexion 5 5  Hip Extension 4 4  Hip Abduction  4 4  Hip Adduction     Hip ER  5 5  Hip IR  5 5  Knee Extension 5 5  Knee Flexion 4 4  Dorsiflexion     Plantarflexion (seated) 5 5  (*= pain, Blank rows = not tested)   SPECIAL TESTS: (08/24/21) Slump (SN 83, -LR 0.32): negative B, increased hamstring tightness SLR (SN 92, -LR 0.29): negative B, increased hamstring tightness   Today: FABER (SN 81): negative B FADIR (SN 94): negative B   PALPATION:  Spinal:  Movement of SIJ in flexion and extension sidelying - WNL  L5-S1 - hypomobile  SIJ - hypomobile B  Ligamentous structures B with increased tension upon palpation, increased tenderness at tailbone and to the L at muscular/ligamentous structures   TODAY'S TREATMENT   Manual Therapy: Myofascial abodminal release for improved tension at  fascial slings Pt noted to initially inhale with forceful activation inward of abdominals and chest expansion  Cueing required to take a slow inhale through the nose and let the abdomen expand naturally Improved abdominal mobility after manual technique    Neuromuscular Re-education:  Pre-treatment assessment   EXTERNAL PELVIC EXAM: Patient educated on the purpose of the pelvic exam and articulated understanding; patient consented to the exam verbally.  Breath coordination: present but inconsistent Voluntary Contraction: present, 1/5 MMT after significant cueing, increased activation of abdominals and Valsalva manuever Relaxation: full  Perineal movement with sustained IAP increase ("bear down"): descent  Perineal movement with rapid IAP increase ("cough"): no change (0= no contraction, 1= flicker, 2= weak squeeze, 3= fair squeeze with lift, 4= good squeeze and lift against resistance, 5= strong squeeze against strong resistance)   Supine hooklying diaphragmatic breathing with VCs and TCs for downregulation of the nervous system and improved management of IAP  Cat/cow for improved posterior chain lengthening and pain modulation, VCs and TCs required for proper technique  Discussion on pain and Valsalva maneuver after hospitalization this past year could have impacted her breathing mechanics and create tension in the abdominal cavity.    Patient response to interventions: After manual technique Pt felt that her abdomen feels less tense.    Patient Education:  Patient provided with HEP including: supine diaphragmatic breathing, cat/cow. Patient educated throughout session on appropriate technique and form using multi-modal cueing, HEP, and activity modification. Patient will benefit from further education in order to maximize compliance and understanding for long-term therapeutic gains.    ASSESSMENT:  Clinical Impression: Patient with excellent motivation to participate in today's  session. Pt with no pain at gluteal musculature. Pt continues to have severe GERD symptoms and is getting a second opinion. Will keep DPT updated. Pt demonstrate deficits in  PFM coordination and PFM strength as evidenced by inconsistent breath coordination and 1/5 MMT of PFM with significant cueing required to decrease bodily compensations (drawing in of abdominals with chest movement when asked to perform PFM contraction). Pt noted to have increased tension throughout the abdomen with minimal discomfort, but tension eased with continued technique. Pt required significant VCs and TCs for proper breathing mechanics which improved with increased time. Pt responded well to all manual, active, and educational interventions. Patient will benefit from skilled therapeutic intervention to address deficits in pain, posture, ROM, and LE strength, PFM coordination, and PFM extensibility in order to increase PLOF and improve overall QOL.    Objective Impairments: decreased coordination, decreased mobility, decreased ROM, hypomobility, improper body mechanics, postural dysfunction, and pain.   Activity Limitations: sitting, standing, transfers, toileting, and locomotion level  Personal Factors: Age, Behavior pattern, Past/current experiences, and 3+ comorbidities: IBS, HTN, anxiety/depression  are also affecting patient's functional outcome.   Rehab Potential: Good  Clinical Decision Making: Evolving/moderate complexity  Evaluation Complexity: Moderate   GOALS: Goals reviewed with patient? Yes  SHORT TERM GOALS: Target date: 10/17/2021  Patient will demonstrate independent and coordinated diaphragmatic breathing in supine with a 1:2 breathing pattern for improved down-regulation of the nervous system and improved management of intra-abdominal pressures in order to increase function at home and in the community. Baseline: will assess next visit  Goal status: INITIAL    LONG TERM GOALS: Target date:  11/28/2021   Patient will decrease worst pain as reported on NPRS by at least 2 points to demonstrate clinically significant reduction in pain in order to restore/improve function and overall QOL. Baseline: 9/10 at L gluteal  Goal status: INITIAL  2.  Patient will report being able to return to activities including, but not limited to: walking, traveling, sitting for prolonged time, and penetrative sex without pain or limitation to indicate complete resolution of the chief concern and return to prior level of participation at home and in the community. Baseline: has pain with all of the above (donut pillow helps) Goal status: INITIAL  3.  Patient will be able to demonstrate improved postural alignment in standing and sitting and overall improved muscular strength in order to reduce pain and complete resolution of pelvic floor dysfunction.  Baseline: L iliac crest higher/L shoulder higher, hypomobile coccyx Goal status: INITIAL  4.  Patient will be able to sit for at least 30 minutes or longer with or without use of donut pillow in order to demonstrate improved reduction in pain and PFM extensibility/corrdination in order to participate fully in the home and in the community.  Baseline: can sit for 30 mins but has to have pillow with her (she brings it to most places) Goal status: INITIAL  5.  Patient will be independent in the performance of a HEP in order to participate fully, without limitation or disruptions, in activities at home and in the community.  Baseline: toileting posture handout Goal status: INITIAL   PLAN: PT Frequency: 1x/week  PT Duration: 12 weeks  Planned Interventions: Therapeutic exercises, Therapeutic activity, Neuromuscular re-education, Balance training, Gait training, Patient/Family education, Self Care, Joint mobilization, Spinal mobilization, Cryotherapy, Moist heat, scar mobilization, Taping, and Manual therapy  Plan For Next Session: coccyx mobility, manual  at SIJ/mobs, how did breathing go?   Claryce Friel, PT, DPT  09/05/2021, 1:16 PM

## 2021-09-19 ENCOUNTER — Ambulatory Visit: Payer: Medicare HMO

## 2021-09-19 DIAGNOSIS — M6289 Other specified disorders of muscle: Secondary | ICD-10-CM | POA: Diagnosis not present

## 2021-09-19 DIAGNOSIS — R278 Other lack of coordination: Secondary | ICD-10-CM | POA: Diagnosis not present

## 2021-09-19 DIAGNOSIS — M7918 Myalgia, other site: Secondary | ICD-10-CM

## 2021-09-19 NOTE — Therapy (Signed)
OUTPATIENT PHYSICAL THERAPY FEMALE PELVIC TREATMENT   Patient Name: Sanvi Ehler MRN: [id number] DOB:15-Jul-1955, 66 y.o., female Today's Date: 09/19/2021   PT End of Session - 09/19/21 1359     Visit Number 4    Number of Visits 12    Date for PT Re-Evaluation 11/16/21    Authorization Type IE: 08/24/21    PT Start Time 1400    PT Stop Time 1440    PT Time Calculation (min) 40 min    Activity Tolerance Patient tolerated treatment well             Past Medical History:  Diagnosis Date   Anxiety    Asthma    NO INHALER-SMOKE AND PERFUME TRIGGERS ASTHMA   Depression    Hyperlipidemia    Hypertension    IBS (irritable bowel syndrome)    Past Surgical History:  Procedure Laterality Date   ABDOMINAL HYSTERECTOMY  age 84   AXILLARY LYMPH NODE BIOPSY Right 08/03/2014   Procedure: EXCISION RIGHT  AXILLARY LIPOMATOSIS;  Surgeon: Nadeen Landau, MD;  Location: [facility];  Service: General;  Laterality: Right;   BREAST BIOPSY Left 10/07/2018   Stereo, Coil Clip, BENIGN BREAST TISSUE WITH AREAS CONTAINING A PREDOMINANCE OF ADIPOSE TISSUE   BREAST BIOPSY Left 10/21/2019   stereo, ribbon clip, path pending    COLONOSCOPY     DIAGNOSTIC LAPAROSCOPY     ESOPHAGOGASTRODUODENOSCOPY (EGD) WITH PROPOFOL N/A 01/26/2021   Procedure: ESOPHAGOGASTRODUODENOSCOPY (EGD) WITH PROPOFOL;  Surgeon: Regis Bill, MD;  Location: [facility];  Service: Endoscopy;  Laterality: N/A;   ESOPHAGOGASTRODUODENOSCOPY (EGD) WITH PROPOFOL N/A 04/17/2021   Procedure: ESOPHAGOGASTRODUODENOSCOPY (EGD) WITH PROPOFOL;  Surgeon: Regis Bill, MD;  Location: [facility];  Service: Endoscopy;  Laterality: N/A;   ESOPHAGOGASTRODUODENOSCOPY (EGD) WITH PROPOFOL N/A 05/02/2021   Procedure: ESOPHAGOGASTRODUODENOSCOPY (EGD) WITH PROPOFOL;  Surgeon: Regis Bill, MD;  Location: [facility];  Service: Endoscopy;  Laterality: N/A;   ESOPHAGOGASTRODUODENOSCOPY (EGD) WITH PROPOFOL N/A  06/06/2021   Procedure: ESOPHAGOGASTRODUODENOSCOPY (EGD) WITH PROPOFOL;  Surgeon: Regis Bill, MD;  Location: [facility];  Service: Endoscopy;  Laterality: N/A;   EXPLORATORY LAPAROTOMY     FOOT SURGERY     KYPHOPLASTY N/A 12/10/2014   Procedure: KYPHOPLASTY L 1;  Surgeon: Kennedy Bucker, MD;  Location: [facility];  Service: Orthopedics;  Laterality: N/A;   OOPHORECTOMY     TONSILLECTOMY     Patient Active Problem List   Diagnosis Date Noted   Gluteal pain (Left) 08/21/2021   Muscle pain, myofascial (Left buttocks) 08/21/2021   Seasonal allergic rhinitis due to pollen 06/15/2021   Duodenal ulcer 06/15/2021   Pyloric stenosis 06/15/2021   Syncope and collapse 01/26/2021   History of GI bleed 01/26/2021   Other intervertebral disc degeneration, thoracic region 12/27/2020   Other intervertebral disc degeneration, lumbar region 12/27/2020   Abnormal mammogram of left breast 09/24/2019   Acid reflux 12/03/2018   Neurogenic pain 11/03/2018   Thoracic facet syndrome (Left) 09/30/2018   Spondylosis without myelopathy or radiculopathy, lumbosacral region 09/30/2018   Spondylosis without myelopathy or radiculopathy, thoracic region 09/30/2018   DDD (degenerative disc disease), thoracolumbar 09/30/2018   DDD (degenerative disc disease), lumbosacral 07/24/2018   History of L1 kyphoplasty 01/01/2018   Traumatic compression fracture of L1 lumbar vertebra, sequela (2016) 01/01/2018   Spondylosis without myelopathy or radiculopathy, lumbar region 01/01/2018   Lumbar facet syndrome (Left) 01/01/2018   Allergy 12/10/2017   Anxiety 12/10/2017   Asthma without status asthmaticus 12/10/2017  Depression 12/10/2017   Hypertension 12/10/2017   Chronic low back pain (1ry area of Pain) (Left) w/o sciatica 12/10/2017   Chronic pain syndrome 12/10/2017   Pharmacologic therapy 12/10/2017   Disorder of skeletal system 12/10/2017   Problems influencing health status 12/10/2017   Postmenopause  06/02/2014    PCP: Berniece Salines, FNP  REFERRING PROVIDER: Delano Metz, MD   REFERRING DIAG:  M79.18 (ICD-10-CM) - Gluteal pain  M79.18 (ICD-10-CM) - Muscle pain, myofascial   THERAPY DIAG:  Gluteal pain  Pelvic floor dysfunction  Other lack of coordination  Rationale for Evaluation and Treatment: Rehabilitation  ONSET DATE: About a month ago                                                                                                                                                                            PRECAUTIONS: None  WEIGHT BEARING RESTRICTIONS: No  FALLS:  Has patient fallen in last 6 months? No  OCCUPATION/SOCIAL ACTIVITIES: Retired, being with grandchildren, have an elliptical/bike, walking     PERTINENT HISTORY/CHART REVIEW: Hospitalization due to bleeding ulcer in Jan 26, 2022.    CHIEF CONCERN: Pt is having pain at the buttocks specifically on the L side. Pt has a hx of back pain but none currently. Pt had a hospitalization Jan 26, 2022 and had significant loss a lot of weight. Pt had tailbone pain in Feb/March. Pt bought donut pillow and it helps but not really. N/T down the L leg.  Pain type: tingling, aching, burning  Pain description: constant, burning, and tingling   Aggravating factors: sitting (30-40 mins), standing transfer,  Relieving factors: ice/heat, stretches from low back    PATIENT GOALS: Pt would like to use the elliptical, walking, and decreasing your pain    GASTROINTESTINAL HISTORY Pain with bowel movement: No Toileting posture: feet flat  Type of bowel movement:Strain No Frequency: 1x/day on average Fully empty rectum: Yes  Fiber supplement: takes Miralax   SEXUAL HISTORY/FUNCTION Pain with intercourse: During Penetration Ability to have vaginal penetration:  Yes; Deep thrusting: Yes, but painful, 6/10 Able to achieve orgasm?: I don't know   OBSTETRICAL HISTORY Vaginal deliveries: G2P2   GYNECOLOGICAL  HISTORY Hysterectomy: abdominal hysterectomy  Pelvic Organ Prolapse: None Pain with exam: yes no Heaviness/pressure: yes no   SUBJECTIVE:        Pt continues to have irritation to the L gluteal musculature especially upon standing. Pt feels she was on her feet a lot during her vacation and it was a "nagging" pain.    PAIN:  Are you having pain? Yes NPRS scale: aggravating more when I stand     COGNITION: Overall cognitive status: Within functional limits for tasks assessed  POSTURE: (08/24/21) Increased adducted hip B in sitting (increase PFM tension)   Thoracic kyphosis: slightly in seated in standing Iliac crest height: L iliac higher Pelvic obliquity: WNL   RANGE OF MOTION:  (Norm range in degrees)  LEFT 08/28/21 RIGHT 08/28/21  Lumbar forward flexion (65):  WNL    Lumbar extension (30): WNL    Lumbar lateral flexion (25):  WNL WNL  Thoracic and Lumbar rotation (30 degrees):    Restricted Restricted   Hip Flexion (0-125):   WNL WNL  Hip IR (0-45):  Restricted Restricted  Hip ER (0-45):  WNL WNL  Hip Adduction:      Hip Abduction (0-40):  WNL WNL  Hip extension (0-15):     (*= pain, Blank rows = not tested)   STRENGTH: MMT    RLE 08/28/21 LLE 08/28/21  Hip Flexion 5 5  Hip Extension 4 4  Hip Abduction  4 4  Hip Adduction     Hip ER  5 5  Hip IR  5 5  Knee Extension 5 5  Knee Flexion 4 4  Dorsiflexion     Plantarflexion (seated) 5 5  (*= pain, Blank rows = not tested)   SPECIAL TESTS: (08/24/21) Slump (SN 83, -LR 0.32): negative B, increased hamstring tightness SLR (SN 92, -LR 0.29): negative B, increased hamstring tightness   Today: FABER (SN 81): negative B FADIR (SN 94): negative B   PALPATION:  Spinal:  Movement of SIJ in flexion and extension sidelying - WNL  L5-S1 - hypomobile  SIJ - hypomobile B  Ligamentous structures B with increased tension upon palpation, increased tenderness at tailbone and to the L at muscular/ligamentous  structures   TODAY'S TREATMENT   Manual Therapy: Diaphragmatic release from B rib flare for improved diaphragm mobility and decreased chest expansion during diaphragmatic breathing   Neuromuscular Re-education:  Pre-treatment assessment  Iliac crest height: L iliac higher in standing with shoes (has orthotics in) Iliac crest height: equal without shoes on   Donning/doffing of heel lift in her shoes without the orthotics as iliac crests are level. Discussion on gradual wear time and when to remove if there is an increase in pain or redness at the heel. Pt verbalized understanding.  Supine hooklying diaphragmatic breathing with VCs and TCs for downregulation of the nervous system and improved management of IAP Significant cueing required to decrease chest expansion and forceful drawing in of superficial abdominals    Review of cat/cow for improved posterior chain lengthening and pain modulation, VCs and TCs required for proper technique  Review of HEP that was given years ago for pain modulation in the low back. VCs and TCs required for proper technique  Supine lateral trunk rotation  Cat/cow  Child's pose    Patient response to interventions: Pt felt she was able to inhale better after manual techniques    Patient Education:  Patient provided with no change to HEP including. Continue supine diaphragmatic breathing, cat/cow. Patient educated throughout session on appropriate technique and form using multi-modal cueing, HEP, and activity modification. Patient will benefit from further education in order to maximize compliance and understanding for long-term therapeutic gains.    ASSESSMENT:  Clinical Impression: Patient with excellent motivation to participate in today's session. Pt continues to demonstrate deficits in PFM coordination, PFM strength, pain, posture, ROM, and LE strength. Lengthy discussion and time spent on reassessment of iliac crest height. With shoes/orthotics  on, Pt continues to demonstrate L iliac crest height but with shoes off  Pt does not show a postural deficit. DPT advised Pt to place the soles that came with the shoe back and place the shoe lift in the R shoe without the orthotic. Pt wears supported sneakers in the home due to having concrete floors and hx of plantar fasciitis. DPT advised Pt on when to remove heel lift if it begins to worsen the gluteal pain. Pt verbalized understanding. Pt required significant VCs and TCs to decrease bodily compensations of increased chest expansion and forceful drawing in of superficial abdominal musculature. With increased time, Pt able to demonstrate proper diaphragmatic breathing. Pt responded well to all manual, active, and educational interventions. Patient will benefit from skilled therapeutic intervention to address deficits in pain, posture, ROM, and LE strength, PFM coordination, and PFM extensibility in order to increase PLOF and improve overall QOL.    Objective Impairments: decreased coordination, decreased mobility, decreased ROM, hypomobility, improper body mechanics, postural dysfunction, and pain.   Activity Limitations: sitting, standing, transfers, toileting, and locomotion level  Personal Factors: Age, Behavior pattern, Past/current experiences, and 3+ comorbidities: IBS, HTN, anxiety/depression  are also affecting patient's functional outcome.   Rehab Potential: Good  Clinical Decision Making: Evolving/moderate complexity  Evaluation Complexity: Moderate   GOALS: Goals reviewed with patient? Yes  SHORT TERM GOALS: Target date: 10/31/2021  Patient will demonstrate independent and coordinated diaphragmatic breathing in supine with a 1:2 breathing pattern for improved down-regulation of the nervous system and improved management of intra-abdominal pressures in order to increase function at home and in the community. Baseline: will assess next visit, (8/8) present but inconsistent  Goal  status: IN PROGRESS    LONG TERM GOALS: Target date: 12/12/2021   Patient will decrease worst pain as reported on NPRS by at least 2 points to demonstrate clinically significant reduction in pain in order to restore/improve function and overall QOL. Baseline: 9/10 at L gluteal  Goal status: INITIAL  2.  Patient will report being able to return to activities including, but not limited to: walking, traveling, sitting for prolonged time, and penetrative sex without pain or limitation to indicate complete resolution of the chief concern and return to prior level of participation at home and in the community. Baseline: has pain with all of the above (donut pillow helps) Goal status: INITIAL  3.  Patient will be able to demonstrate improved postural alignment in standing and sitting and overall improved muscular strength in order to reduce pain and complete resolution of pelvic floor dysfunction.  Baseline: L iliac crest higher/L shoulder higher, hypomobile coccyx Goal status: INITIAL  4.  Patient will be able to sit for at least 30 minutes or longer with or without use of donut pillow in order to demonstrate improved reduction in pain and PFM extensibility/corrdination in order to participate fully in the home and in the community.  Baseline: can sit for 30 mins but has to have pillow with her (she brings it to most places) Goal status: INITIAL  5.  Patient will be independent in the performance of a HEP in order to participate fully, without limitation or disruptions, in activities at home and in the community.  Baseline: toileting posture handout Goal status: INITIAL   PLAN: PT Frequency: 1x/week  PT Duration: 12 weeks  Planned Interventions: Therapeutic exercises, Therapeutic activity, Neuromuscular re-education, Balance training, Gait training, Patient/Family education, Self Care, Joint mobilization, Spinal mobilization, Cryotherapy, Moist heat, scar mobilization, Taping, and Manual  therapy  Plan For Next Session: how did breathing go?, manual at SIJ/mobs, how  was cat/cow and childs pose?    Seleny Allbright, PT, DPT  09/19/2021, 2:02 PM

## 2021-09-25 ENCOUNTER — Ambulatory Visit: Payer: Self-pay

## 2021-09-25 NOTE — Telephone Encounter (Signed)
Pt is calling to report that she has a bleeding ulser. And has lost a lot of weight and stay cold. Pt would like to know is there a blood test that she can take.     Chief Complaint: Pt. States she "feels cold all the time." Symptoms: Above Frequency: Several weeks Pertinent Negatives: Patient denies any other symptoms Disposition: [] ED /[] Urgent Care (no appt availability in office) / [x] Appointment(In office/virtual)/ []  [facility]/ [] Home Care/ [] Refused Recommended Disposition /[] [place]/ []  Follow-up with PCP Additional Notes:   Reason for Disposition  Nursing judgment or information in reference  Answer Assessment - Initial Assessment Questions 1. REASON FOR CALL: "What is your main concern right now?"     Pt. Feels cold 2. ONSET: "When did the  start?"     Several weeks 3. SEVERITY: "How bad is the ?"     Moderate 4. FEVER: "Do you have a fever?"     No 5. OTHER SYMPTOMS: "Do you have any other new symptoms?"     No 6. TREATMENTS AND RESPONSE: "What have you done so far to try to make this better? What medicines have you used?"     No 7. PREGNANCY: "Is there any chance you are pregnant?" "When was your last menstrual period?"     No  Protocols used: No Guideline Available-[facility]

## 2021-09-26 ENCOUNTER — Ambulatory Visit: Payer: Medicare HMO

## 2021-09-26 DIAGNOSIS — M6289 Other specified disorders of muscle: Secondary | ICD-10-CM | POA: Diagnosis not present

## 2021-09-26 DIAGNOSIS — R278 Other lack of coordination: Secondary | ICD-10-CM | POA: Diagnosis not present

## 2021-09-26 DIAGNOSIS — M7918 Myalgia, other site: Secondary | ICD-10-CM

## 2021-09-26 NOTE — Therapy (Signed)
OUTPATIENT PHYSICAL THERAPY FEMALE PELVIC TREATMENT   Patient Name: Angela Espinoza MRN: [id number] DOB:12-28-1955, 66 y.o., female Today's Date: 09/26/2021   PT End of Session - 09/26/21 1308     Visit Number 5    Number of Visits 12    Date for PT Re-Evaluation 11/16/21    Authorization Type IE: 08/24/21    PT Start Time 1310    PT Stop Time 1350    PT Time Calculation (min) 40 min    Activity Tolerance Patient tolerated treatment well             Past Medical History:  Diagnosis Date   Anxiety    Asthma    NO INHALER-SMOKE AND PERFUME TRIGGERS ASTHMA   Depression    Hyperlipidemia    Hypertension    IBS (irritable bowel syndrome)    Past Surgical History:  Procedure Laterality Date   ABDOMINAL HYSTERECTOMY  age 73   AXILLARY LYMPH NODE BIOPSY Right 08/03/2014   Procedure: EXCISION RIGHT  AXILLARY LIPOMATOSIS;  Surgeon: Nadeen Landau, MD;  Location: [facility];  Service: General;  Laterality: Right;   BREAST BIOPSY Left 10/07/2018   Stereo, Coil Clip, BENIGN BREAST TISSUE WITH AREAS CONTAINING A PREDOMINANCE OF ADIPOSE TISSUE   BREAST BIOPSY Left 10/21/2019   stereo, ribbon clip, path pending    COLONOSCOPY     DIAGNOSTIC LAPAROSCOPY     ESOPHAGOGASTRODUODENOSCOPY (EGD) WITH PROPOFOL N/A 01/26/2021   Procedure: ESOPHAGOGASTRODUODENOSCOPY (EGD) WITH PROPOFOL;  Surgeon: Regis Bill, MD;  Location: [facility];  Service: Endoscopy;  Laterality: N/A;   ESOPHAGOGASTRODUODENOSCOPY (EGD) WITH PROPOFOL N/A 04/17/2021   Procedure: ESOPHAGOGASTRODUODENOSCOPY (EGD) WITH PROPOFOL;  Surgeon: Regis Bill, MD;  Location: [facility];  Service: Endoscopy;  Laterality: N/A;   ESOPHAGOGASTRODUODENOSCOPY (EGD) WITH PROPOFOL N/A 05/02/2021   Procedure: ESOPHAGOGASTRODUODENOSCOPY (EGD) WITH PROPOFOL;  Surgeon: Regis Bill, MD;  Location: [facility];  Service: Endoscopy;  Laterality: N/A;   ESOPHAGOGASTRODUODENOSCOPY (EGD) WITH PROPOFOL N/A  06/06/2021   Procedure: ESOPHAGOGASTRODUODENOSCOPY (EGD) WITH PROPOFOL;  Surgeon: Regis Bill, MD;  Location: [facility];  Service: Endoscopy;  Laterality: N/A;   EXPLORATORY LAPAROTOMY     FOOT SURGERY     KYPHOPLASTY N/A 12/10/2014   Procedure: KYPHOPLASTY L 1;  Surgeon: Kennedy Bucker, MD;  Location: [facility];  Service: Orthopedics;  Laterality: N/A;   OOPHORECTOMY     TONSILLECTOMY     Patient Active Problem List   Diagnosis Date Noted   Gluteal pain (Left) 08/21/2021   Muscle pain, myofascial (Left buttocks) 08/21/2021   Seasonal allergic rhinitis due to pollen 06/15/2021   Duodenal ulcer 06/15/2021   Pyloric stenosis 06/15/2021   Syncope and collapse 01/26/2021   History of GI bleed 01/26/2021   Other intervertebral disc degeneration, thoracic region 12/27/2020   Other intervertebral disc degeneration, lumbar region 12/27/2020   Abnormal mammogram of left breast 09/24/2019   Acid reflux 12/03/2018   Neurogenic pain 11/03/2018   Thoracic facet syndrome (Left) 09/30/2018   Spondylosis without myelopathy or radiculopathy, lumbosacral region 09/30/2018   Spondylosis without myelopathy or radiculopathy, thoracic region 09/30/2018   DDD (degenerative disc disease), thoracolumbar 09/30/2018   DDD (degenerative disc disease), lumbosacral 07/24/2018   History of L1 kyphoplasty 01/01/2018   Traumatic compression fracture of L1 lumbar vertebra, sequela (2016) 01/01/2018   Spondylosis without myelopathy or radiculopathy, lumbar region 01/01/2018   Lumbar facet syndrome (Left) 01/01/2018   Allergy 12/10/2017   Anxiety 12/10/2017   Asthma without status asthmaticus 12/10/2017  Depression 12/10/2017   Hypertension 12/10/2017   Chronic low back pain (1ry area of Pain) (Left) w/o sciatica 12/10/2017   Chronic pain syndrome 12/10/2017   Pharmacologic therapy 12/10/2017   Disorder of skeletal system 12/10/2017   Problems influencing health status 12/10/2017   Postmenopause  06/02/2014    PCP: Bo Merino, FNP  REFERRING PROVIDER: Milinda Pointer, MD   REFERRING DIAG:  M79.18 (ICD-10-CM) - Gluteal pain  M79.18 (ICD-10-CM) - Muscle pain, myofascial   THERAPY DIAG:  Gluteal pain  Pelvic floor dysfunction  Other lack of coordination  Rationale for Evaluation and Treatment: Rehabilitation  ONSET DATE: About a month ago                                                                                                                                                                            PRECAUTIONS: None  WEIGHT BEARING RESTRICTIONS: No  FALLS:  Has patient fallen in last 6 months? No  OCCUPATION/SOCIAL ACTIVITIES: Retired, being with grandchildren, have an elliptical/bike, walking     PERTINENT HISTORY/CHART REVIEW: Hospitalization due to bleeding ulcer in Jan 26, 2022.    CHIEF CONCERN: Pt is having pain at the buttocks specifically on the L side. Pt has a hx of back pain but none currently. Pt had a hospitalization Jan 26, 2022 and had significant loss a lot of weight. Pt had tailbone pain in Feb/March. Pt bought donut pillow and it helps but not really. N/T down the L leg.  Pain type: tingling, aching, burning  Pain description: constant, burning, and tingling   Aggravating factors: sitting (30-40 mins), standing transfer,  Relieving factors: ice/heat, stretches from low back    PATIENT GOALS: Pt would like to use the elliptical, walking, and decreasing your pain    GASTROINTESTINAL HISTORY Pain with bowel movement: No Toileting posture: feet flat  Type of bowel movement:Strain No Frequency: 1x/day on average Fully empty rectum: Yes  Fiber supplement: takes Miralax   SEXUAL HISTORY/FUNCTION Pain with intercourse: During Penetration Ability to have vaginal penetration:  Yes; Deep thrusting: Yes, but painful, 6/10 Able to achieve orgasm?: I don't know   OBSTETRICAL HISTORY Vaginal deliveries: G2P2   GYNECOLOGICAL  HISTORY Hysterectomy: abdominal hysterectomy  Pelvic Organ Prolapse: None Pain with exam: yes no Heaviness/pressure: yes no   SUBJECTIVE:        Pt feels that she is not sure if the heel lift is working, but is causing no increased pain.    PAIN:  Are you having pain? Yes NPRS scale: 5/10, L hamstring, more soreness       COGNITION: Overall cognitive status: Within functional limits for tasks assessed   POSTURE: (08/24/21) Increased adducted hip B in sitting (increase  PFM tension)   Thoracic kyphosis: slightly in seated in standing Iliac crest height: L iliac higher Pelvic obliquity: WNL   RANGE OF MOTION:  (Norm range in degrees)  LEFT 08/28/21 RIGHT 08/28/21  Lumbar forward flexion (65):  WNL    Lumbar extension (30): WNL    Lumbar lateral flexion (25):  WNL WNL  Thoracic and Lumbar rotation (30 degrees):    Restricted Restricted   Hip Flexion (0-125):   WNL WNL  Hip IR (0-45):  Restricted Restricted  Hip ER (0-45):  WNL WNL  Hip Adduction:      Hip Abduction (0-40):  WNL WNL  Hip extension (0-15):     (*= pain, Blank rows = not tested)   STRENGTH: MMT    RLE 08/28/21 LLE 08/28/21  Hip Flexion 5 5  Hip Extension 4 4  Hip Abduction  4 4  Hip Adduction     Hip ER  5 5  Hip IR  5 5  Knee Extension 5 5  Knee Flexion 4 4  Dorsiflexion     Plantarflexion (seated) 5 5  (*= pain, Blank rows = not tested)   SPECIAL TESTS: (08/24/21) Slump (SN 83, -LR 0.32): negative B, increased hamstring tightness SLR (SN 92, -LR 0.29): negative B, increased hamstring tightness   Today: FABER (SN 81): negative B FADIR (SN 94): negative B   PALPATION:  Spinal:  Movement of SIJ in flexion and extension sidelying - WNL  L5-S1 - hypomobile  SIJ - hypomobile B  Ligamentous structures B with increased tension upon palpation, increased tenderness at tailbone and to the L at muscular/ligamentous structures   TODAY'S TREATMENT   Manual Therapy: STM at L gluteal  musculature with increased muscular tenderness towards inferior gluteal Pt with improved muscular tension with increased time   Abdominal myofascial release for improved fascial sling tension  Slight tenderness in LUQ, but eased with increased time  Therapeutic Exercise: Discussion on postural awareness in sitting and in standing - emphasis on center of gravity of pelvis as that can contribute to constant gluteal activation and leading to pain   Seated pelvic tilts for improved postural stability, VCs and TCs required   Sidelying clamshells, Bx10 for improved gluteal/lateral LE strengthening, VCs and TCs for proper technique   Patient response to interventions: Pt felt better at L gluteals at end of session   Patient Education:  Patient provided with HEP including: sidelying clamshells. Patient educated throughout session on appropriate technique and form using multi-modal cueing, HEP, and activity modification. Patient will benefit from further education in order to maximize compliance and understanding for long-term therapeutic gains.    ASSESSMENT:  Clinical Impression: Patient with excellent motivation to participate in today's session. Pt continues to demonstrate deficits in PFM coordination, PFM strength, pain, posture, ROM, and LE strength. Pt reports no increased pain or discomfort with R heel lift. Pt required moderate VCs and TCs to decrease bodily compensations and for proper technique. Discussion on sitting and standing pelvic posture to aid in placing COG on ischial tuberosities instead of coccyx/muscular structures. Pt verbalized understanding. Pt with increased muscular tension at L gluteals and tenderness but eased with increased time. Pt with improved soreness at end of session. Pt responded well to all manual, active, and educational interventions. Patient will benefit from skilled therapeutic intervention to address deficits in pain, posture, ROM, and LE strength, PFM  coordination, and PFM extensibility in order to increase PLOF and improve overall QOL.    Objective Impairments: decreased  coordination, decreased mobility, decreased ROM, hypomobility, improper body mechanics, postural dysfunction, and pain.   Activity Limitations: sitting, standing, transfers, toileting, and locomotion level  Personal Factors: Age, Behavior pattern, Past/current experiences, and 3+ comorbidities: IBS, HTN, anxiety/depression  are also affecting patient's functional outcome.   Rehab Potential: Good  Clinical Decision Making: Evolving/moderate complexity  Evaluation Complexity: Moderate   GOALS: Goals reviewed with patient? Yes  SHORT TERM GOALS: Target date: 11/07/2021  Patient will demonstrate independent and coordinated diaphragmatic breathing in supine with a 1:2 breathing pattern for improved down-regulation of the nervous system and improved management of intra-abdominal pressures in order to increase function at home and in the community. Baseline: will assess next visit, (8/8) present but inconsistent  Goal status: IN PROGRESS    LONG TERM GOALS: Target date: 12/19/2021   Patient will decrease worst pain as reported on NPRS by at least 2 points to demonstrate clinically significant reduction in pain in order to restore/improve function and overall QOL. Baseline: 9/10 at L gluteal  Goal status: INITIAL  2.  Patient will report being able to return to activities including, but not limited to: walking, traveling, sitting for prolonged time, and penetrative sex without pain or limitation to indicate complete resolution of the chief concern and return to prior level of participation at home and in the community. Baseline: has pain with all of the above (donut pillow helps) Goal status: INITIAL  3.  Patient will be able to demonstrate improved postural alignment in standing and sitting and overall improved muscular strength in order to reduce pain and complete  resolution of pelvic floor dysfunction.  Baseline: L iliac crest higher/L shoulder higher, hypomobile coccyx Goal status: INITIAL  4.  Patient will be able to sit for at least 30 minutes or longer with or without use of donut pillow in order to demonstrate improved reduction in pain and PFM extensibility/corrdination in order to participate fully in the home and in the community.  Baseline: can sit for 30 mins but has to have pillow with her (she brings it to most places) Goal status: INITIAL  5.  Patient will be independent in the performance of a HEP in order to participate fully, without limitation or disruptions, in activities at home and in the community.  Baseline: toileting posture handout Goal status: INITIAL   PLAN: PT Frequency: 1x/week  PT Duration: 12 weeks  Planned Interventions: Therapeutic exercises, Therapeutic activity, Neuromuscular re-education, Balance training, Gait training, Patient/Family education, Self Care, Joint mobilization, Spinal mobilization, Cryotherapy, Moist heat, scar mobilization, Taping, and Manual therapy  Plan For Next Session: FOTO, manual at SIJ/mobs in prone, bridges  [employer], PT, DPT  09/26/2021, 1:09 PM

## 2021-09-27 ENCOUNTER — Ambulatory Visit (INDEPENDENT_AMBULATORY_CARE_PROVIDER_SITE_OTHER): Payer: Medicare HMO | Admitting: Nurse Practitioner

## 2021-09-27 ENCOUNTER — Encounter: Payer: Self-pay | Admitting: Nurse Practitioner

## 2021-09-27 VITALS — BP 122/70 | HR 96 | Temp 98.5°F | Resp 16 | Wt 117.2 lb

## 2021-09-27 DIAGNOSIS — K311 Adult hypertrophic pyloric stenosis: Secondary | ICD-10-CM | POA: Diagnosis not present

## 2021-09-27 DIAGNOSIS — K269 Duodenal ulcer, unspecified as acute or chronic, without hemorrhage or perforation: Secondary | ICD-10-CM

## 2021-09-27 DIAGNOSIS — Z8719 Personal history of other diseases of the digestive system: Secondary | ICD-10-CM | POA: Diagnosis not present

## 2021-09-27 DIAGNOSIS — R6883 Chills (without fever): Secondary | ICD-10-CM | POA: Diagnosis not present

## 2021-09-27 LAB — POCT URINALYSIS DIPSTICK
Bilirubin, UA: NEGATIVE
Blood, UA: NEGATIVE
Glucose, UA: NEGATIVE
Ketones, UA: NEGATIVE
Leukocytes, UA: NEGATIVE
Nitrite, UA: NEGATIVE
Protein, UA: NEGATIVE
Spec Grav, UA: 1.02 (ref 1.010–1.025)
Urobilinogen, UA: 0.2 E.U./dL
pH, UA: 5 (ref 5.0–8.0)

## 2021-09-27 NOTE — Assessment & Plan Note (Signed)
Has new patient appointment in November with [facility].  Her current GI Dr. Locklear referred her for second opinion.  Keep appointment.  

## 2021-09-27 NOTE — Assessment & Plan Note (Signed)
Has new patient appointment in November with [facility].  Her current GI Dr. Mia Creek referred her for second opinion.  Keep appointment.

## 2021-09-27 NOTE — Progress Notes (Signed)
BP 122/70   Pulse 96   Temp 98.5 F (36.9 C)   Resp 16   Wt 117 lb 3.2 oz (53.2 kg)   SpO2 98%   BMI 21.44 kg/m    Subjective:    Patient ID: Angela Espinoza, female    DOB: 10/19/1955, 66 y.o.   MRN: [id number]  HPI: Angela Espinoza is a 66 y.o. female  Chief Complaint  Patient presents with   Chills    Feels cold   Chills:  Patient reports she has been having chills for about 4 weeks. She says she keep her house around 77 degrees but still feels cold.  She says she does have a family history of thyroid problems.  She denies any chest pain, shortness of breath, abdominal pain.  She does have a history of duodenal ulcer, pyloric stenosis and gi bleed.  She denies any hematemesis or hematochezia. She denies any fever or recent illness. She denies any urinary complaints. She does see Dr. Haig Prophet, GI.  Last endoscopy was 06/06/2021, which showed - Normal esophagus. - A medium amount of food (residue) in the stomach. - Acquired deformity in the pylorus. Dilated. - Normal examined duodenum. - No specimens collected. GI has also placed a referral to [facility] for second opinion on pyloric stenosis, she is seeing them in November.  Will get urine dip, and labs.    Relevant past medical, surgical, family and social history reviewed and updated as indicated. Interim medical history since our last visit reviewed. Allergies and medications reviewed and updated.  Review of Systems  Constitutional: Negative for fever or weight change. Positive for chills Respiratory: Negative for cough and shortness of breath.   Cardiovascular: Negative for chest pain or palpitations.  Gastrointestinal: Negative for abdominal pain, no bowel changes.  Musculoskeletal: Negative for gait problem or joint swelling.  Skin: Negative for rash.  Neurological: Negative for dizziness or headache.  No other specific complaints in a complete review of systems (except as listed in HPI above).       Objective:    BP 122/70   Pulse 96   Temp 98.5 F (36.9 C)   Resp 16   Wt 117 lb 3.2 oz (53.2 kg)   SpO2 98%   BMI 21.44 kg/m   Wt Readings from Last 3 Encounters:  09/27/21 117 lb 3.2 oz (53.2 kg)  08/21/21 115 lb (52.2 kg)  08/14/21 115 lb 8 oz (52.4 kg)    Physical Exam  Constitutional: Patient appears well-developed and well-nourished. No distress.  HEENT: head atraumatic, normocephalic, pupils equal and reactive to light, neck supple Cardiovascular: Normal rate, regular rhythm and normal heart sounds.  No murmur heard. No BLE edema. Pulmonary/Chest: Effort normal and breath sounds normal. No respiratory distress. Abdominal: Soft.  There is no tenderness. Psychiatric: Patient has a normal mood and affect. behavior is normal. Judgment and thought content normal.  Results for orders placed or performed in visit on 06/15/21  Lipid panel  Result Value Ref Range   Cholesterol 335 (H) <200 mg/dL   HDL 68 > OR = 50 mg/dL   Triglycerides 126 <150 mg/dL   LDL Cholesterol (Calc) 240 (H) mg/dL (calc)   Total CHOL/HDL Ratio 4.9 <5.0 (calc)   Non-HDL Cholesterol (Calc) 267 (H) <130 mg/dL (calc)  CBC with Differential/Platelet  Result Value Ref Range   WBC 6.0 3.8 - 10.8 Thousand/uL   RBC 4.19 3.80 - 5.10 Million/uL   Hemoglobin 12.9 11.7 - 15.5 g/dL  HCT 38.5 35.0 - 45.0 %   MCV 91.9 80.0 - 100.0 fL   MCH 30.8 27.0 - 33.0 pg   MCHC 33.5 32.0 - 36.0 g/dL   RDW 13.1 11.0 - 15.0 %   Platelets 307 140 - 400 Thousand/uL   MPV 10.4 7.5 - 12.5 fL   Neutro Abs 3,858 1,500 - 7,800 cells/uL   Lymphs Abs 1,572 850 - 3,900 cells/uL   Absolute Monocytes 432 200 - 950 cells/uL   Eosinophils Absolute 78 15 - 500 cells/uL   Basophils Absolute 60 0 - 200 cells/uL   Neutrophils Relative % 64.3 %   Total Lymphocyte 26.2 %   Monocytes Relative 7.2 %   Eosinophils Relative 1.3 %   Basophils Relative 1.0 %  COMPLETE METABOLIC PANEL WITH GFR  Result Value Ref Range   Glucose, Bld 73 65 -  99 mg/dL   BUN 14 7 - 25 mg/dL   Creat 0.69 0.50 - 1.05 mg/dL   eGFR 95 > OR = 60 mL/min/1.20m   BUN/Creatinine Ratio NOT APPLICABLE 6 - 22 (calc)   Sodium 137 135 - 146 mmol/L   Potassium 4.1 3.5 - 5.3 mmol/L   Chloride 100 98 - 110 mmol/L   CO2 29 20 - 32 mmol/L   Calcium 9.9 8.6 - 10.4 mg/dL   Total Protein 7.1 6.1 - 8.1 g/dL   Albumin 4.4 3.6 - 5.1 g/dL   Globulin 2.7 1.9 - 3.7 g/dL (calc)   AG Ratio 1.6 1.0 - 2.5 (calc)   Total Bilirubin 0.4 0.2 - 1.2 mg/dL   Alkaline phosphatase (APISO) 58 37 - 153 U/L   AST 20 10 - 35 U/L   ALT 12 6 - 29 U/L  Hemoglobin A1c  Result Value Ref Range   Hgb A1c MFr Bld 5.1 <5.7 % of total Hgb   Mean Plasma Glucose 100 mg/dL   eAG (mmol/L) 5.5 mmol/L      Assessment & Plan:   Problem List Items Addressed This Visit       Digestive   Duodenal ulcer    Has new patient appointment in November with [facility].  Her current GI Dr. LHaig Prophetreferred her for second opinion.  Keep appointment.       Relevant Orders   CBC with Differential/Platelet   Pyloric stenosis    Has new patient appointment in November with [facility].  Her current GI Dr. LHaig Prophetreferred her for second opinion.  Keep appointment.       Relevant Orders   CBC with Differential/Platelet     Other   History of GI bleed    Has new patient appointment in November with [facility].  Her current GI Dr. LHaig Prophetreferred her for second opinion.  Keep appointment.       Relevant Orders   CBC with Differential/Platelet   Other Visit Diagnoses     Chills (without fever)    -  Primary   Getting urine dip and lab work.   Relevant Orders   CBC with Differential/Platelet   COMPLETE METABOLIC PANEL WITH GFR   TSH   POCT urinalysis dipstick        Follow up plan: Return if symptoms worsen or fail to improve.

## 2021-09-28 LAB — COMPLETE METABOLIC PANEL WITH GFR
AG Ratio: 2 (calc) (ref 1.0–2.5)
ALT: 12 U/L (ref 6–29)
AST: 21 U/L (ref 10–35)
Albumin: 4.7 g/dL (ref 3.6–5.1)
Alkaline phosphatase (APISO): 59 U/L (ref 37–153)
BUN: 12 mg/dL (ref 7–25)
CO2: 27 mmol/L (ref 20–32)
Calcium: 9.9 mg/dL (ref 8.6–10.4)
Chloride: 98 mmol/L (ref 98–110)
Creat: 0.81 mg/dL (ref 0.50–1.05)
Globulin: 2.4 g/dL (calc) (ref 1.9–3.7)
Glucose, Bld: 93 mg/dL (ref 65–99)
Potassium: 3.9 mmol/L (ref 3.5–5.3)
Sodium: 136 mmol/L (ref 135–146)
Total Bilirubin: 0.4 mg/dL (ref 0.2–1.2)
Total Protein: 7.1 g/dL (ref 6.1–8.1)
eGFR: 80 mL/min/{1.73_m2} (ref >=60)

## 2021-09-28 LAB — CBC WITH DIFFERENTIAL/PLATELET
Absolute Monocytes: 340 cells/uL (ref 200–950)
Basophils Absolute: 57 cells/uL (ref 0–200)
Basophils Relative: 0.7 %
Eosinophils Absolute: 97 cells/uL (ref 15–500)
Eosinophils Relative: 1.2 %
HCT: 38.9 % (ref 35.0–45.0)
Hemoglobin: 13.5 g/dL (ref 11.7–15.5)
Lymphs Abs: 1831 cells/uL (ref 850–3900)
MCH: 32.6 pg (ref 27.0–33.0)
MCHC: 34.7 g/dL (ref 32.0–36.0)
MCV: 94 fL (ref 80.0–100.0)
MPV: 10.3 fL (ref 7.5–12.5)
Monocytes Relative: 4.2 %
Neutro Abs: 5775 cells/uL (ref 1500–7800)
Neutrophils Relative %: 71.3 %
Platelets: 309 10*3/uL (ref 140–400)
RBC: 4.14 10*6/uL (ref 3.80–5.10)
RDW: 11.7 % (ref 11.0–15.0)
Total Lymphocyte: 22.6 %
WBC: 8.1 10*3/uL (ref 3.8–10.8)

## 2021-09-28 LAB — TSH: TSH: 3.6 mIU/L (ref 0.40–4.50)

## 2021-10-03 DIAGNOSIS — H2512 Age-related nuclear cataract, left eye: Secondary | ICD-10-CM | POA: Diagnosis not present

## 2021-10-04 ENCOUNTER — Ambulatory Visit: Payer: Medicare HMO | Attending: Pain Medicine

## 2021-10-04 DIAGNOSIS — R278 Other lack of coordination: Secondary | ICD-10-CM

## 2021-10-04 DIAGNOSIS — M6289 Other specified disorders of muscle: Secondary | ICD-10-CM | POA: Diagnosis not present

## 2021-10-04 DIAGNOSIS — M7918 Myalgia, other site: Secondary | ICD-10-CM | POA: Diagnosis not present

## 2021-10-04 NOTE — Therapy (Signed)
OUTPATIENT PHYSICAL THERAPY FEMALE PELVIC TREATMENT   Patient Name: Angela Espinoza MRN: [id number] DOB:[birth year], 66 y.o., female Today's Date: 10/04/2021   PT End of Session - 10/04/21 1309     Visit Number 6    Number of Visits 12    Date for PT Re-Evaluation 11/16/21    Authorization Type IE: 08/24/21    PT Start Time 1315    PT Stop Time 1355    PT Time Calculation (min) 40 min    Activity Tolerance Patient tolerated treatment well             Past Medical History:  Diagnosis Date   Anxiety    Asthma    NO INHALER-SMOKE AND PERFUME TRIGGERS ASTHMA   Depression    Hyperlipidemia    Hypertension    IBS (irritable bowel syndrome)    Past Surgical History:  Procedure Laterality Date   ABDOMINAL HYSTERECTOMY  age 54   AXILLARY LYMPH NODE BIOPSY Right 08/03/2014   Procedure: EXCISION RIGHT  AXILLARY LIPOMATOSIS;  Surgeon: Leonie Green, MD;  Location: [facility];  Service: General;  Laterality: Right;   BREAST BIOPSY Left 10/07/2018   Stereo, Coil Clip, BENIGN BREAST TISSUE WITH AREAS CONTAINING A PREDOMINANCE OF ADIPOSE TISSUE   BREAST BIOPSY Left 10/21/2019   stereo, ribbon clip, path pending    COLONOSCOPY     DIAGNOSTIC LAPAROSCOPY     ESOPHAGOGASTRODUODENOSCOPY (EGD) WITH PROPOFOL N/A 01/26/2021   Procedure: ESOPHAGOGASTRODUODENOSCOPY (EGD) WITH PROPOFOL;  Surgeon: Lesly Rubenstein, MD;  Location: [facility];  Service: Endoscopy;  Laterality: N/A;   ESOPHAGOGASTRODUODENOSCOPY (EGD) WITH PROPOFOL N/A 04/17/2021   Procedure: ESOPHAGOGASTRODUODENOSCOPY (EGD) WITH PROPOFOL;  Surgeon: Lesly Rubenstein, MD;  Location: [facility];  Service: Endoscopy;  Laterality: N/A;   ESOPHAGOGASTRODUODENOSCOPY (EGD) WITH PROPOFOL N/A 05/02/2021   Procedure: ESOPHAGOGASTRODUODENOSCOPY (EGD) WITH PROPOFOL;  Surgeon: Lesly Rubenstein, MD;  Location: [facility];  Service: Endoscopy;  Laterality: N/A;   ESOPHAGOGASTRODUODENOSCOPY (EGD) WITH PROPOFOL N/A  06/06/2021   Procedure: ESOPHAGOGASTRODUODENOSCOPY (EGD) WITH PROPOFOL;  Surgeon: Lesly Rubenstein, MD;  Location: [facility];  Service: Endoscopy;  Laterality: N/A;   EXPLORATORY LAPAROTOMY     FOOT SURGERY     KYPHOPLASTY N/A 12/10/2014   Procedure: KYPHOPLASTY L 1;  Surgeon: Hessie Knows, MD;  Location: [facility];  Service: Orthopedics;  Laterality: N/A;   OOPHORECTOMY     TONSILLECTOMY     Patient Active Problem List   Diagnosis Date Noted   Gluteal pain (Left) 08/21/2021   Muscle pain, myofascial (Left buttocks) 08/21/2021   Seasonal allergic rhinitis due to pollen 06/15/2021   Duodenal ulcer 06/15/2021   Pyloric stenosis 06/15/2021   Syncope and collapse 01/26/2021   History of GI bleed 01/26/2021   Other intervertebral disc degeneration, thoracic region 12/27/2020   Other intervertebral disc degeneration, lumbar region 12/27/2020   Abnormal mammogram of left breast 09/24/2019   Acid reflux 12/03/2018   Neurogenic pain 11/03/2018   Thoracic facet syndrome (Left) 09/30/2018   Spondylosis without myelopathy or radiculopathy, lumbosacral region 09/30/2018   Spondylosis without myelopathy or radiculopathy, thoracic region 09/30/2018   DDD (degenerative disc disease), thoracolumbar 09/30/2018   DDD (degenerative disc disease), lumbosacral 07/24/2018   History of L1 kyphoplasty 01/01/2018   Traumatic compression fracture of L1 lumbar vertebra, sequela (2016) 01/01/2018   Spondylosis without myelopathy or radiculopathy, lumbar region 01/01/2018   Lumbar facet syndrome (Left) 01/01/2018   Allergy 12/10/2017   Anxiety 12/10/2017   Asthma without status asthmaticus 12/10/2017  Depression 12/10/2017   Hypertension 12/10/2017   Chronic low back pain (1ry area of Pain) (Left) w/o sciatica 12/10/2017   Chronic pain syndrome 12/10/2017   Pharmacologic therapy 12/10/2017   Disorder of skeletal system 12/10/2017   Problems influencing health status 12/10/2017   Postmenopause  06/02/2014    PCP: Bo Merino, FNP  REFERRING PROVIDER: Milinda Pointer, MD   REFERRING DIAG:  M79.18 (ICD-10-CM) - Gluteal pain  M79.18 (ICD-10-CM) - Muscle pain, myofascial   THERAPY DIAG:  Gluteal pain  Pelvic floor dysfunction  Other lack of coordination  Rationale for Evaluation and Treatment: Rehabilitation  ONSET DATE: About a month ago                                                                                                                                                                            PRECAUTIONS: None  WEIGHT BEARING RESTRICTIONS: No  FALLS:  Has patient fallen in last 6 months? No  OCCUPATION/SOCIAL ACTIVITIES: Retired, being with grandchildren, have an elliptical/bike, walking     PERTINENT HISTORY/CHART REVIEW: Hospitalization due to bleeding ulcer in Jan 26, 2022.    CHIEF CONCERN: Pt is having pain at the buttocks specifically on the L side. Pt has a hx of back pain but none currently. Pt had a hospitalization Jan 26, 2022 and had significant loss a lot of weight. Pt had tailbone pain in Feb/March. Pt bought donut pillow and it helps but not really. N/T down the L leg.  Pain type: tingling, aching, burning  Pain description: constant, burning, and tingling   Aggravating factors: sitting (30-40 mins), standing transfer,  Relieving factors: ice/heat, stretches from low back    PATIENT GOALS: Pt would like to use the elliptical, walking, and decreasing your pain    GASTROINTESTINAL HISTORY Pain with bowel movement: No Toileting posture: feet flat  Type of bowel movement:Strain No Frequency: 1x/day on average Fully empty rectum: Yes  Fiber supplement: takes Miralax   SEXUAL HISTORY/FUNCTION Pain with intercourse: During Penetration Ability to have vaginal penetration:  Yes; Deep thrusting: Yes, but painful, 6/10 Able to achieve orgasm?: I don't know   OBSTETRICAL HISTORY Vaginal deliveries: G2P2   GYNECOLOGICAL  HISTORY Hysterectomy: abdominal hysterectomy  Pelvic Organ Prolapse: None Pain with exam: yes no Heaviness/pressure: yes no   SUBJECTIVE:        Pt feels increased pain in the L gluteal area. Pt wore flats on Sunday to church and that is the only thing she can think of that might of aggravated. Pt has started using elliptical but did not have pain the day after or two days after.    PAIN:  Are you having pain? Yes NPRS scale: L glute, 6-7/10  COGNITION: Overall cognitive status: Within functional limits for tasks assessed   POSTURE: (08/24/21) Increased adducted hip B in sitting (increase PFM tension)   Thoracic kyphosis: slightly in seated in standing Iliac crest height: L iliac higher Pelvic obliquity: WNL   RANGE OF MOTION:  (Norm range in degrees)  LEFT 08/28/21 RIGHT 08/28/21  Lumbar forward flexion (65):  WNL    Lumbar extension (30): WNL    Lumbar lateral flexion (25):  WNL WNL  Thoracic and Lumbar rotation (30 degrees):    Restricted Restricted   Hip Flexion (0-125):   WNL WNL  Hip IR (0-45):  Restricted Restricted  Hip ER (0-45):  WNL WNL  Hip Adduction:      Hip Abduction (0-40):  WNL WNL  Hip extension (0-15):     (*= pain, Blank rows = not tested)   STRENGTH: MMT    RLE 08/28/21 LLE 08/28/21  Hip Flexion 5 5  Hip Extension 4 4  Hip Abduction  4 4  Hip Adduction     Hip ER  5 5  Hip IR  5 5  Knee Extension 5 5  Knee Flexion 4 4  Dorsiflexion     Plantarflexion (seated) 5 5  (*= pain, Blank rows = not tested)   SPECIAL TESTS: (08/24/21) Slump (SN 83, -LR 0.32): negative B, increased hamstring tightness SLR (SN 92, -LR 0.29): negative B, increased hamstring tightness   Today: FABER (SN 81): negative B FADIR (SN 94): negative B   PALPATION:  Spinal:  Movement of SIJ in flexion and extension sidelying - WNL  L5-S1 - hypomobile  SIJ - hypomobile B  Ligamentous structures B with increased tension upon palpation, increased tenderness at  tailbone and to the L at muscular/ligamentous structures   TODAY'S TREATMENT   Neuromuscular Re-education Review of FOTO PFDI Pain - Discussion on responses compared to IE  IE: 21 Today: 17   Review of LTGs and STGs below- Discussion on progression and what to expect in upcoming sessions  Discussion on practicing PFM relaxation techniques and self abdominal massage before penetrative sex.   Lengthy discussion on pain neuroscience, cortisol levels increasing pain signals, and the parasympathetic/sympathetic nervous system. Pt verbalized understanding.   Seated diaphragmatic breathing with VCs and TCs for downregulation of the nervous system and improved management of IAP Cueing to decrease shoulder elevation    Patient response to interventions: Pt ended session with 4/10 pain    Patient Education:  Patient provided with HEP including: seated diaphragmatic breathing. Patient educated throughout session on appropriate technique and form using multi-modal cueing, HEP, and activity modification. Patient will benefit from further education in order to maximize compliance and understanding for long-term therapeutic gains.    ASSESSMENT:  Clinical Impression: Patient with excellent motivation to participate in today's session. Pt continues to demonstrate deficits in PFM coordination, PFM strength, pain, posture, ROM, and LE strength. Pt reports increased pain at the L gluteal, 6/10 but cannot pinpoint an activity/motion that increased the pain. After lengthy discussion on pain neuroscience and seated diaphragmatic breathing, [name] L gluteal pain diminished to 4/10. After review of LTGs and STGs, Pt is making progression with worst pain at L gluteal now being 7/10 compared to 9/10 on IE, being able to travel longer than 30 minutes with occasional need for donut cushion, no pain with penetrative sex (6/10 on IE), and ability to sit 45 mins-1 hr without increased tailbone pain. After discussion,  Pt reports increased anxiety levels which can lead to increased  muscular tension and continued increase of L shoulder height. Pt required moderate VCs and TCs during active interventions to decrease bodily compensations. Pt responded well to active and educational interventions. Patient will benefit from skilled therapeutic intervention to address deficits in pain, posture, ROM, and LE strength, PFM coordination, and PFM extensibility in order to increase PLOF and improve overall QOL.    Objective Impairments: decreased coordination, decreased mobility, decreased ROM, hypomobility, improper body mechanics, postural dysfunction, and pain.   Activity Limitations: sitting, standing, transfers, toileting, and locomotion level  Personal Factors: Age, Behavior pattern, Past/current experiences, and 3+ comorbidities: IBS, HTN, anxiety/depression  are also affecting patient's functional outcome.   Rehab Potential: Good  Clinical Decision Making: Evolving/moderate complexity  Evaluation Complexity: Moderate   GOALS: Goals reviewed with patient? Yes  SHORT TERM GOALS: Target date: 11/16/21  Patient will demonstrate independent and coordinated diaphragmatic breathing in supine with a 1:2 breathing pattern for improved down-regulation of the nervous system and improved management of intra-abdominal pressures in order to increase function at home and in the community. Baseline: will assess next visit, (8/8) present but inconsistent; (9/6): continuing to require cueing to decrease chest excursion Goal status: IN PROGRESS    LONG TERM GOALS: Target date: 11/16/21  Patient will decrease worst pain as reported on NPRS by at least 2 points to demonstrate clinically significant reduction in pain in order to restore/improve function and overall QOL. Baseline: 9/10 at L gluteal; (9/6): 7/10 L gluteal Goal status: IN PROGRESS  2.  Patient will report being able to return to activities including, but not  limited to: walking, traveling, sitting for prolonged time, and penetrative sex without pain or limitation to indicate complete resolution of the chief concern and return to prior level of participation at home and in the community. Baseline: has pain with all of the above (donut pillow helps); (9/6): L gluteal, intermittent 5/10  Goal status: IN PROGRESS  3.  Patient will be able to demonstrate improved postural alignment in standing and sitting and overall improved muscular strength in order to reduce pain and complete resolution of pelvic floor dysfunction.  Baseline: L iliac crest higher/L shoulder higher, hypomobile coccyx; (9/6): L shoulder height increased Goal status: IN PROGRESS  4.  Patient will be able to sit for at least 30 minutes or longer with or without use of donut pillow in order to demonstrate improved reduction in pain and PFM extensibility/corrdination in order to participate fully in the home and in the community.  Baseline: can sit for 30 mins but has to have pillow with her (she brings it to most places); (9/6): use of cushion in car occasionally, 45 mins-1 hr Goal status: IN PROGRESS  5.  Patient will be independent in the performance of a HEP in order to participate fully, without limitation or disruptions, in activities at home and in the community.  Baseline: toileting posture handout; (9/6): continues to demonstrate understanding HEP Goal status: IN PROGRESS   PLAN: PT Frequency: 1x/week  PT Duration: 12 weeks  Planned Interventions: Therapeutic exercises, Therapeutic activity, Neuromuscular re-education, Balance training, Gait training, Patient/Family education, Self Care, Joint mobilization, Spinal mobilization, Cryotherapy, Moist heat, scar mobilization, Taping, and Manual therapy  Plan For Next Session: STSs with breath, focus on shoulder (squeezes, upper trap, wall angels, chin tucks)  Shelli Portilla, PT, DPT  10/04/2021, 1:10 PM

## 2021-10-10 ENCOUNTER — Ambulatory Visit: Payer: Medicare HMO

## 2021-10-10 DIAGNOSIS — R278 Other lack of coordination: Secondary | ICD-10-CM

## 2021-10-10 DIAGNOSIS — M6289 Other specified disorders of muscle: Secondary | ICD-10-CM | POA: Diagnosis not present

## 2021-10-10 DIAGNOSIS — M7918 Myalgia, other site: Secondary | ICD-10-CM | POA: Diagnosis not present

## 2021-10-10 NOTE — Therapy (Signed)
OUTPATIENT PHYSICAL THERAPY FEMALE PELVIC TREATMENT   Patient Name: Angela Espinoza MRN: [id number] DOB:31-Jul-1955, 66 y.o., female Today's Date: 10/10/2021   PT End of Session - 10/10/21 1440     Visit Number 7    Number of Visits 12    Date for PT Re-Evaluation 11/16/21    Authorization Type IE: 08/24/21    PT Start Time 1445    PT Stop Time 1525    PT Time Calculation (min) 40 min    Activity Tolerance Patient tolerated treatment well             Past Medical History:  Diagnosis Date   Anxiety    Asthma    NO INHALER-SMOKE AND PERFUME TRIGGERS ASTHMA   Depression    Hyperlipidemia    Hypertension    IBS (irritable bowel syndrome)    Past Surgical History:  Procedure Laterality Date   ABDOMINAL HYSTERECTOMY  age 27   AXILLARY LYMPH NODE BIOPSY Right 08/03/2014   Procedure: EXCISION RIGHT  AXILLARY LIPOMATOSIS;  Surgeon: Nadeen Landau, MD;  Location: [facility];  Service: General;  Laterality: Right;   BREAST BIOPSY Left 10/07/2018   Stereo, Coil Clip, BENIGN BREAST TISSUE WITH AREAS CONTAINING A PREDOMINANCE OF ADIPOSE TISSUE   BREAST BIOPSY Left 10/21/2019   stereo, ribbon clip, path pending    COLONOSCOPY     DIAGNOSTIC LAPAROSCOPY     ESOPHAGOGASTRODUODENOSCOPY (EGD) WITH PROPOFOL N/A 01/26/2021   Procedure: ESOPHAGOGASTRODUODENOSCOPY (EGD) WITH PROPOFOL;  Surgeon: Regis Bill, MD;  Location: [facility];  Service: Endoscopy;  Laterality: N/A;   ESOPHAGOGASTRODUODENOSCOPY (EGD) WITH PROPOFOL N/A 04/17/2021   Procedure: ESOPHAGOGASTRODUODENOSCOPY (EGD) WITH PROPOFOL;  Surgeon: Regis Bill, MD;  Location: [facility];  Service: Endoscopy;  Laterality: N/A;   ESOPHAGOGASTRODUODENOSCOPY (EGD) WITH PROPOFOL N/A 05/02/2021   Procedure: ESOPHAGOGASTRODUODENOSCOPY (EGD) WITH PROPOFOL;  Surgeon: Regis Bill, MD;  Location: [facility];  Service: Endoscopy;  Laterality: N/A;   ESOPHAGOGASTRODUODENOSCOPY (EGD) WITH PROPOFOL N/A  06/06/2021   Procedure: ESOPHAGOGASTRODUODENOSCOPY (EGD) WITH PROPOFOL;  Surgeon: Regis Bill, MD;  Location: [facility];  Service: Endoscopy;  Laterality: N/A;   EXPLORATORY LAPAROTOMY     FOOT SURGERY     KYPHOPLASTY N/A 12/10/2014   Procedure: KYPHOPLASTY L 1;  Surgeon: Kennedy Bucker, MD;  Location: [facility];  Service: Orthopedics;  Laterality: N/A;   OOPHORECTOMY     TONSILLECTOMY     Patient Active Problem List   Diagnosis Date Noted   Gluteal pain (Left) 08/21/2021   Muscle pain, myofascial (Left buttocks) 08/21/2021   Seasonal allergic rhinitis due to pollen 06/15/2021   Duodenal ulcer 06/15/2021   Pyloric stenosis 06/15/2021   Syncope and collapse 01/26/2021   History of GI bleed 01/26/2021   Other intervertebral disc degeneration, thoracic region 12/27/2020   Other intervertebral disc degeneration, lumbar region 12/27/2020   Abnormal mammogram of left breast 09/24/2019   Acid reflux 12/03/2018   Neurogenic pain 11/03/2018   Thoracic facet syndrome (Left) 09/30/2018   Spondylosis without myelopathy or radiculopathy, lumbosacral region 09/30/2018   Spondylosis without myelopathy or radiculopathy, thoracic region 09/30/2018   DDD (degenerative disc disease), thoracolumbar 09/30/2018   DDD (degenerative disc disease), lumbosacral 07/24/2018   History of L1 kyphoplasty 01/01/2018   Traumatic compression fracture of L1 lumbar vertebra, sequela (2016) 01/01/2018   Spondylosis without myelopathy or radiculopathy, lumbar region 01/01/2018   Lumbar facet syndrome (Left) 01/01/2018   Allergy 12/10/2017   Anxiety 12/10/2017   Asthma without status asthmaticus 12/10/2017  Depression 12/10/2017   Hypertension 12/10/2017   Chronic low back pain (1ry area of Pain) (Left) w/o sciatica 12/10/2017   Chronic pain syndrome 12/10/2017   Pharmacologic therapy 12/10/2017   Disorder of skeletal system 12/10/2017   Problems influencing health status 12/10/2017   Postmenopause  06/02/2014    PCP: Bo Merino, FNP  REFERRING PROVIDER: Milinda Pointer, MD   REFERRING DIAG:  M79.18 (ICD-10-CM) - Gluteal pain  M79.18 (ICD-10-CM) - Muscle pain, myofascial   THERAPY DIAG:  Gluteal pain  Pelvic floor dysfunction  Other lack of coordination  Rationale for Evaluation and Treatment: Rehabilitation  ONSET DATE: About a month ago                                                                                                                                                                            PRECAUTIONS: None  WEIGHT BEARING RESTRICTIONS: No  FALLS:  Has patient fallen in last 6 months? No  OCCUPATION/SOCIAL ACTIVITIES: Retired, being with grandchildren, have an elliptical/bike, walking     PERTINENT HISTORY/CHART REVIEW: Hospitalization due to bleeding ulcer in Jan 26, 2022.    CHIEF CONCERN: Pt is having pain at the buttocks specifically on the L side. Pt has a hx of back pain but none currently. Pt had a hospitalization Jan 26, 2022 and had significant loss a lot of weight. Pt had tailbone pain in Feb/March. Pt bought donut pillow and it helps but not really. N/T down the L leg.  Pain type: tingling, aching, burning  Pain description: constant, burning, and tingling   Aggravating factors: sitting (30-40 mins), standing transfer,  Relieving factors: ice/heat, stretches from low back    PATIENT GOALS: Pt would like to use the elliptical, walking, and decreasing your pain    GASTROINTESTINAL HISTORY Pain with bowel movement: No Toileting posture: feet flat  Type of bowel movement:Strain No Frequency: 1x/day on average Fully empty rectum: Yes  Fiber supplement: takes Miralax   SEXUAL HISTORY/FUNCTION Pain with intercourse: During Penetration Ability to have vaginal penetration:  Yes; Deep thrusting: Yes, but painful, 6/10 Able to achieve orgasm?: I don't know   OBSTETRICAL HISTORY Vaginal deliveries: G2P2   GYNECOLOGICAL  HISTORY Hysterectomy: abdominal hysterectomy  Pelvic Organ Prolapse: None Pain with exam: yes no Heaviness/pressure: yes no   SUBJECTIVE:        Pt is continuing to have intermittent gluteal pain, but feels it is more sore but not sharp/throbbing/burning.    PAIN:  Are you having pain? Yes NPRS scale: L glute, 6/10      COGNITION: Overall cognitive status: Within functional limits for tasks assessed   POSTURE: (08/24/21) Increased adducted hip B in sitting (increase PFM tension)   Thoracic  kyphosis: slightly in seated in standing Iliac crest height: L iliac higher Pelvic obliquity: WNL   RANGE OF MOTION:  (Norm range in degrees)  LEFT 08/28/21 RIGHT 08/28/21  Lumbar forward flexion (65):  WNL    Lumbar extension (30): WNL    Lumbar lateral flexion (25):  WNL WNL  Thoracic and Lumbar rotation (30 degrees):    Restricted Restricted   Hip Flexion (0-125):   WNL WNL  Hip IR (0-45):  Restricted Restricted  Hip ER (0-45):  WNL WNL  Hip Adduction:      Hip Abduction (0-40):  WNL WNL  Hip extension (0-15):     (*= pain, Blank rows = not tested)   STRENGTH: MMT    RLE 08/28/21 LLE 08/28/21  Hip Flexion 5 5  Hip Extension 4 4  Hip Abduction  4 4  Hip Adduction     Hip ER  5 5  Hip IR  5 5  Knee Extension 5 5  Knee Flexion 4 4  Dorsiflexion     Plantarflexion (seated) 5 5  (*= pain, Blank rows = not tested)   SPECIAL TESTS: (08/24/21) Slump (SN 83, -LR 0.32): negative B, increased hamstring tightness SLR (SN 92, -LR 0.29): negative B, increased hamstring tightness   Today: FABER (SN 81): negative B FADIR (SN 94): negative B   PALPATION:  Spinal:  Movement of SIJ in flexion and extension sidelying - WNL  L5-S1 - hypomobile  SIJ - hypomobile B  Ligamentous structures B with increased tension upon palpation, increased tenderness at tailbone and to the L at muscular/ligamentous structures   TODAY'S TREATMENT   Therapeutic Exercise: Seated upper trap  stretch, B x30" for improved tissue extensibility and decreased L shoulder elevation, significant cueing required  With mirror feedback   Seated scapular retractions for improved posterior chain strengthening, x10 with verbal/tactile cueing required  Standing hip 2-way abduction/extension, Bx10 for improved LE strengthening, VCs required   Neuromuscular Re-education STSs, x10, with coordinated breathing for improved IAP management and pain modulation  With increased time Pt unable to coordinate with breath  Wall squats, x10, with coordinated breathing for improved IAP management and pain modulation  Discussion on reciprocal gait, moving of thoracic spine and UE during ambulation, as Pt observed to have decreased thoracic rotation during amb  Patient response to interventions: Pt felt better at L gluteal while performing wall squats    Patient Education:  Patient provided with HEP including: wall squats with breath, LE strengthening, upper trap, scap retract. Patient educated throughout session on appropriate technique and form using multi-modal cueing, HEP, and activity modification. Patient will benefit from further education in order to maximize compliance and understanding for long-term therapeutic gains.    ASSESSMENT:  Clinical Impression: Patient with excellent motivation to participate in today's session. Pt continues to demonstrate deficits in PFM coordination, PFM strength, pain, posture, ROM, and LE strength. Pt reports increased soreness at the L gluteal, 6/10. After brief gait assessment, Pt observed to have decreased thoracic rotation, increased L shoulder height, and decreased UE movement with reciprocal gait. Pt also observed to have slight posterior lean during amb. Discussion on moving COG anteriorly to aid in posture during amb and provided mirror feedback. Pt verbalized understanding. Session focused on tissue extensibility and strengthening UE/LE for improved posture  and to carryover into gait. Pt required significant TCs and VCs for STSs and wall squats for proper technique. Pt ended session with 4/10 soreness at L gluteal. Pt responded well to active  and educational interventions. Patient will benefit from skilled therapeutic intervention to address deficits in pain, posture, ROM, and LE strength, PFM coordination, and PFM extensibility in order to increase PLOF and improve overall QOL.    Objective Impairments: decreased coordination, decreased mobility, decreased ROM, hypomobility, improper body mechanics, postural dysfunction, and pain.   Activity Limitations: sitting, standing, transfers, toileting, and locomotion level  Personal Factors: Age, Behavior pattern, Past/current experiences, and 3+ comorbidities: IBS, HTN, anxiety/depression  are also affecting patient's functional outcome.   Rehab Potential: Good  Clinical Decision Making: Evolving/moderate complexity  Evaluation Complexity: Moderate   GOALS: Goals reviewed with patient? Yes  SHORT TERM GOALS: Target date: 11/16/21  Patient will demonstrate independent and coordinated diaphragmatic breathing in supine with a 1:2 breathing pattern for improved down-regulation of the nervous system and improved management of intra-abdominal pressures in order to increase function at home and in the community. Baseline: will assess next visit, (8/8) present but inconsistent; (9/6): continuing to require cueing to decrease chest excursion Goal status: IN PROGRESS    LONG TERM GOALS: Target date: 11/16/21  Patient will decrease worst pain as reported on NPRS by at least 2 points to demonstrate clinically significant reduction in pain in order to restore/improve function and overall QOL. Baseline: 9/10 at L gluteal; (9/6): 7/10 L gluteal Goal status: IN PROGRESS  2.  Patient will report being able to return to activities including, but not limited to: walking, traveling, sitting for prolonged time,  and penetrative sex without pain or limitation to indicate complete resolution of the chief concern and return to prior level of participation at home and in the community. Baseline: has pain with all of the above (donut pillow helps); (9/6): L gluteal, intermittent 5/10  Goal status: IN PROGRESS  3.  Patient will be able to demonstrate improved postural alignment in standing and sitting and overall improved muscular strength in order to reduce pain and complete resolution of pelvic floor dysfunction.  Baseline: L iliac crest higher/L shoulder higher, hypomobile coccyx; (9/6): L shoulder height increased Goal status: IN PROGRESS  4.  Patient will be able to sit for at least 30 minutes or longer with or without use of donut pillow in order to demonstrate improved reduction in pain and PFM extensibility/corrdination in order to participate fully in the home and in the community.  Baseline: can sit for 30 mins but has to have pillow with her (she brings it to most places); (9/6): use of cushion in car occasionally, 45 mins-1 hr Goal status: IN PROGRESS  5.  Patient will be independent in the performance of a HEP in order to participate fully, without limitation or disruptions, in activities at home and in the community.  Baseline: toileting posture handout; (9/6): continues to demonstrate understanding HEP Goal status: IN PROGRESS   PLAN: PT Frequency: 1x/week  PT Duration: 12 weeks  Planned Interventions: Therapeutic exercises, Therapeutic activity, Neuromuscular re-education, Balance training, Gait training, Patient/Family education, Self Care, Joint mobilization, Spinal mobilization, Cryotherapy, Moist heat, scar mobilization, Taping, and Manual therapy  Plan For Next Session: manual with thoracic rotation, SB stretches, manual on neck, standing wall feedback  Aeliana Spates, PT, DPT  10/10/2021, 2:41 PM

## 2021-10-16 ENCOUNTER — Encounter: Payer: Self-pay | Admitting: Ophthalmology

## 2021-10-17 ENCOUNTER — Other Ambulatory Visit: Payer: Self-pay | Admitting: Gastroenterology

## 2021-10-17 ENCOUNTER — Ambulatory Visit: Payer: Medicare HMO

## 2021-10-17 DIAGNOSIS — R1032 Left lower quadrant pain: Secondary | ICD-10-CM

## 2021-10-19 ENCOUNTER — Ambulatory Visit
Admission: RE | Admit: 2021-10-19 | Discharge: 2021-10-19 | Disposition: A | Discharge status: Home / Self Care | Payer: Medicare HMO | Source: Ambulatory Visit | Attending: Gastroenterology | Admitting: Gastroenterology

## 2021-10-19 DIAGNOSIS — R1032 Left lower quadrant pain: Secondary | ICD-10-CM | POA: Diagnosis not present

## 2021-10-19 DIAGNOSIS — I700 Atherosclerosis of aorta: Secondary | ICD-10-CM | POA: Diagnosis not present

## 2021-10-19 MED ORDER — IOHEXOL 300 MG/ML  SOLN
100.0000 mL | Freq: Once | INTRAMUSCULAR | Status: AC | PRN
  Administered 2021-10-19: 100 mL via INTRAVENOUS

## 2021-10-19 NOTE — Discharge Instructions (Signed)

## 2021-10-20 NOTE — Anesthesia Preprocedure Evaluation (Signed)
Anesthesia Evaluation  Patient identified by MRN, date of birth, ID band Patient awake    Reviewed: Allergy & Precautions, H&P , NPO status , Patient's Chart, lab work & pertinent test results, reviewed documented beta blocker date and time   Airway Mallampati: II   Neck ROM: full    Dental  (+) Poor Dentition   Pulmonary asthma ,    Pulmonary exam normal        Cardiovascular Exercise Tolerance: Good hypertension, Pt. on medications and On Medications Normal cardiovascular exam Rhythm:regular Rate:Normal     Neuro/Psych PSYCHIATRIC DISORDERS Anxiety Depression negative neurological ROS     GI/Hepatic Neg liver ROS, GERD  Medicated and Controlled,  Endo/Other  negative endocrine ROS  Renal/GU negative Renal ROS  negative genitourinary   Musculoskeletal  (+) Arthritis ,   Abdominal   Peds  Hematology negative hematology ROS (+)   Anesthesia Other Findings Past Medical History: No date: Anxiety No date: Asthma     Comment:  NO INHALER-SMOKE AND PERFUME TRIGGERS ASTHMA No date: Depression No date: Hyperlipidemia No date: Hypertension No date: IBS (irritable bowel syndrome) Past Surgical History: age 66: ABDOMINAL HYSTERECTOMY 08/03/2014: AXILLARY LYMPH NODE BIOPSY; Right     Comment:  Procedure: EXCISION RIGHT  AXILLARY LIPOMATOSIS;                Surgeon: Leonie Green, MD;  Location: [facility];                Service: General;  Laterality: Right; 10/07/2018: BREAST BIOPSY; Left     Comment:  Stereo, Coil Clip, BENIGN BREAST TISSUE WITH AREAS               CONTAINING A PREDOMINANCE OF ADIPOSE TISSUE 10/21/2019: BREAST BIOPSY; Left     Comment:  stereo, ribbon clip, path pending  No date: COLONOSCOPY No date: DIAGNOSTIC LAPAROSCOPY 01/26/2021: ESOPHAGOGASTRODUODENOSCOPY (EGD) WITH PROPOFOL; N/A     Comment:  Procedure: ESOPHAGOGASTRODUODENOSCOPY (EGD) WITH               PROPOFOL;  Surgeon: Lesly Rubenstein, MD;  Location:               [facility];  Service: Endoscopy;  Laterality: N/A; 04/17/2021: ESOPHAGOGASTRODUODENOSCOPY (EGD) WITH PROPOFOL; N/A     Comment:  Procedure: ESOPHAGOGASTRODUODENOSCOPY (EGD) WITH               PROPOFOL;  Surgeon: Lesly Rubenstein, MD;  Location:               [facility];  Service: Endoscopy;  Laterality: N/A; 05/02/2021: ESOPHAGOGASTRODUODENOSCOPY (EGD) WITH PROPOFOL; N/A     Comment:  Procedure: ESOPHAGOGASTRODUODENOSCOPY (EGD) WITH               PROPOFOL;  Surgeon: Lesly Rubenstein, MD;  Location:               [facility];  Service: Endoscopy;  Laterality: N/A; No date: EXPLORATORY LAPAROTOMY No date: FOOT SURGERY 12/10/2014: KYPHOPLASTY; N/A     Comment:  Procedure: KYPHOPLASTY L 1;  Surgeon: Hessie Knows, MD;               Location: [facility];  Service: Orthopedics;  Laterality:               N/A; No date: OOPHORECTOMY No date: TONSILLECTOMY BMI    Body Mass Index: 20.12 kg/m     Reproductive/Obstetrics negative OB ROS  Anesthesia Physical  Anesthesia Plan  ASA: 2  Anesthesia Plan: General   Post-op Pain Management:    Induction: Intravenous  PONV Risk Score and Plan:   Airway Management Planned: Natural Airway  Additional Equipment:   Intra-op Plan:   Post-operative Plan:   Informed Consent: I have reviewed the patients History and Physical, chart, labs and discussed the procedure including the risks, benefits and alternatives for the proposed anesthesia with the patient or authorized representative who has indicated his/her understanding and acceptance.       Plan Discussed with: CRNA  Anesthesia Plan Comments:        Anesthesia Quick Evaluation

## 2021-10-24 ENCOUNTER — Ambulatory Visit
Admission: RE | Admit: 2021-10-24 | Discharge: 2021-10-24 | Disposition: A | Discharge status: Home / Self Care | Payer: Medicare HMO | Source: Ambulatory Visit | Attending: Ophthalmology | Admitting: Ophthalmology

## 2021-10-24 ENCOUNTER — Encounter
Admission: RE | Disposition: A | Discharge status: Home / Self Care | Payer: Self-pay | Source: Ambulatory Visit | Attending: Ophthalmology

## 2021-10-24 ENCOUNTER — Other Ambulatory Visit: Payer: Self-pay

## 2021-10-24 ENCOUNTER — Encounter: Payer: Self-pay | Admitting: Ophthalmology

## 2021-10-24 ENCOUNTER — Ambulatory Visit: Payer: Medicare HMO | Admitting: Anesthesiology

## 2021-10-24 ENCOUNTER — Ambulatory Visit (AMBULATORY_SURGERY_CENTER): Payer: Medicare HMO | Admitting: Anesthesiology

## 2021-10-24 DIAGNOSIS — K219 Gastro-esophageal reflux disease without esophagitis: Secondary | ICD-10-CM | POA: Insufficient documentation

## 2021-10-24 DIAGNOSIS — Z7722 Contact with and (suspected) exposure to environmental tobacco smoke (acute) (chronic): Secondary | ICD-10-CM

## 2021-10-24 DIAGNOSIS — I10 Essential (primary) hypertension: Secondary | ICD-10-CM | POA: Insufficient documentation

## 2021-10-24 DIAGNOSIS — E785 Hyperlipidemia, unspecified: Secondary | ICD-10-CM | POA: Diagnosis not present

## 2021-10-24 DIAGNOSIS — Z79899 Other long term (current) drug therapy: Secondary | ICD-10-CM | POA: Diagnosis not present

## 2021-10-24 DIAGNOSIS — H2512 Age-related nuclear cataract, left eye: Secondary | ICD-10-CM

## 2021-10-24 HISTORY — PX: CATARACT EXTRACTION W/PHACO: SHX586

## 2021-10-24 HISTORY — DX: Gastro-esophageal reflux disease without esophagitis: K21.9

## 2021-10-24 SURGERY — PHACOEMULSIFICATION, CATARACT, WITH IOL INSERTION
Anesthesia: Monitor Anesthesia Care | Site: Eye | Laterality: Left

## 2021-10-24 MED ORDER — TETRACAINE HCL 0.5 % OP SOLN
1.0000 [drp] | OPHTHALMIC | Status: DC | PRN
  Administered 2021-10-24 (×3): 1 [drp] via OPHTHALMIC

## 2021-10-24 MED ORDER — MOXIFLOXACIN HCL 0.5 % OP SOLN
OPHTHALMIC | Status: DC | PRN
  Administered 2021-10-24: 0.2 mL via OPHTHALMIC

## 2021-10-24 MED ORDER — BRIMONIDINE TARTRATE-TIMOLOL 0.2-0.5 % OP SOLN
OPHTHALMIC | Status: DC | PRN
  Administered 2021-10-24: 1 [drp] via OPHTHALMIC

## 2021-10-24 MED ORDER — MIDAZOLAM HCL 2 MG/2ML IJ SOLN
INTRAMUSCULAR | Status: DC | PRN
  Administered 2021-10-24: 2 mg via INTRAVENOUS

## 2021-10-24 MED ORDER — SIGHTPATH DOSE#1 BSS IO SOLN
INTRAOCULAR | Status: DC | PRN
  Administered 2021-10-24: 50 mL via OPHTHALMIC

## 2021-10-24 MED ORDER — ARMC OPHTHALMIC DILATING DROPS
1.0000 | OPHTHALMIC | Status: DC | PRN
  Administered 2021-10-24 (×3): 1 via OPHTHALMIC

## 2021-10-24 MED ORDER — SIGHTPATH DOSE#1 NA CHONDROIT SULF-NA HYALURON 40-17 MG/ML IO SOLN
INTRAOCULAR | Status: DC | PRN
  Administered 2021-10-24: 1 mL via INTRAOCULAR

## 2021-10-24 MED ORDER — SIGHTPATH DOSE#1 BSS IO SOLN
INTRAOCULAR | Status: DC | PRN
  Administered 2021-10-24: 1 mL via INTRAMUSCULAR

## 2021-10-24 MED ORDER — SIGHTPATH DOSE#1 BSS IO SOLN
INTRAOCULAR | Status: DC | PRN
  Administered 2021-10-24: 15 mL

## 2021-10-24 SURGICAL SUPPLY — 11 items
CATARACT SUITE SIGHTPATH (MISCELLANEOUS) ×1 IMPLANT
FEE CATARACT SUITE SIGHTPATH (MISCELLANEOUS) ×1 IMPLANT
GLOVE SURG ENC TEXT LTX SZ8 (GLOVE) ×1 IMPLANT
GLOVE SURG TRIUMPH 8.0 PF LTX (GLOVE) ×1 IMPLANT
LENS IOL ACRSF VT TRC 315 21.0 IMPLANT
LENS IOL ACRYSOF VIVITY 21.0 ×1 IMPLANT
LENS IOL VIVITY 315 21.0 ×1 IMPLANT
NDL FILTER BLUNT 18X1 1/2 (NEEDLE) ×1 IMPLANT
NEEDLE FILTER BLUNT 18X1 1/2 (NEEDLE) ×1 IMPLANT
SYR 3ML LL SCALE MARK (SYRINGE) ×1 IMPLANT
WATER STERILE IRR 250ML POUR (IV SOLUTION) ×1 IMPLANT

## 2021-10-24 NOTE — Transfer of Care (Signed)
Immediate Anesthesia Transfer of Care Note  Patient: Angela Espinoza  Procedure(s) Performed: CATARACT EXTRACTION PHACO AND INTRAOCULAR LENS PLACEMENT (IOC) LEFT VIVITY TORIC LENS 7.91 00:41.6 (Left: Eye)  Patient Location: PACU  Anesthesia Type: MAC  Level of Consciousness: awake, alert  and patient cooperative  Airway and Oxygen Therapy: Patient Spontanous Breathing and Patient connected to supplemental oxygen  Post-op Assessment: Post-op Vital signs reviewed, Patient's Cardiovascular Status Stable, Respiratory Function Stable, Patent Airway and No signs of Nausea or vomiting  Post-op Vital Signs: Reviewed and stable  Complications: No notable events documented.

## 2021-10-24 NOTE — Op Note (Signed)
PREOPERATIVE DIAGNOSIS:  Nuclear sclerotic cataract of the left eye.   POSTOPERATIVE DIAGNOSIS:  Nuclear sclerotic cataract of the left eye.   OPERATIVE PROCEDURE: Procedure(s): CATARACT EXTRACTION PHACO AND INTRAOCULAR LENS PLACEMENT (IOC) LEFT VIVITY TORIC LENS 7.91 00:41.6   SURGEON:  Birder Robson, MD.   ANESTHESIA: 1.      Managed anesthesia care. 2.     0.66ml os Shugarcaine was instilled following the paracentesis [email or web address]   COMPLICATIONS:  None.   TECHNIQUE:   Stop and chop    DESCRIPTION OF PROCEDURE:  The patient was examined and consented in the preoperative holding area where the aforementioned topical anesthesia was applied to the left eye.  The patient was brought back to the Operating Room where he was sat upright on the gurney and given a target to fixate upon while the eye was marked at the 3:00 and 9:00 position.  The patient was then reclined on the operating table.  The eye was prepped and draped in the usual sterile ophthalmic fashion and a lid speculum was placed. A paracentesis was created with the side port blade and the anterior chamber was filled with viscoelastic. A near clear corneal incision was performed with the steel keratome. A continuous curvilinear capsulorrhexis was performed with a cystotome followed by the capsulorrhexis forceps. Hydrodissection and hydrodelineation were carried out with BSS on a blunt cannula. The lens was removed in a stop and chop technique and the remaining cortical material was removed with the irrigation-aspiration handpiece. The eye was inflated with viscoelastic and the ZCT lens was placed in the eye and rotated to within a few degrees of the predetermined orientation.  The remaining viscoelastic was removed from the eye.  The Sinskey hook was used to rotate the toric lens into its final resting place at 135 degrees.  0.1 ml of Vigamox was placed in the anterior chamber. The eye was inflated to a physiologic pressure and found to  be watertight.  The eye was dressed with Vigamox and Combigan The patient was given protective glasses to wear throughout the day and a shield with which to sleep tonight. The patient was also given drops with which to begin a drop regimen today and will follow-up with me in one day. Implant Name Type Inv. Item Serial No. Manufacturer Lot No. LRB No. Used Action  LENS IOL ACRYSOF VIVITY 21.0 - [id number]  LENS IOL ACRYSOF VIVITY 21.0 [id number] [employer]  Left 1 Implanted   Procedure(s): CATARACT EXTRACTION PHACO AND INTRAOCULAR LENS PLACEMENT (IOC) LEFT VIVITY TORIC LENS 7.91 00:41.6 (Left)  Electronically signed: Birder Robson 9/26/20231:17 PM

## 2021-10-24 NOTE — Anesthesia Postprocedure Evaluation (Signed)
Anesthesia Post Note  Patient: Angela Espinoza  Procedure(s) Performed: CATARACT EXTRACTION PHACO AND INTRAOCULAR LENS PLACEMENT (IOC) LEFT VIVITY TORIC LENS 7.91 00:41.6 (Left: Eye)     Patient location during evaluation: PACU Anesthesia Type: MAC Level of consciousness: awake and alert Pain management: pain level controlled Vital Signs Assessment: post-procedure vital signs reviewed and stable Respiratory status: spontaneous breathing, nonlabored ventilation, respiratory function stable and patient connected to nasal cannula oxygen Cardiovascular status: stable and blood pressure returned to baseline Postop Assessment: no apparent nausea or vomiting Anesthetic complications: no   No notable events documented.  [place] Ouch

## 2021-10-24 NOTE — H&P (Signed)
[facility]   Primary Care Physician:  Berniece Salines, FNP Ophthalmologist: Dr.Merritt Mccravy  Pre-Procedure History & Physical: HPI:  Angela Espinoza is a 66 y.o. female here for cataract surgery.   Past Medical History:  Diagnosis Date   Anxiety    Asthma    NO INHALER-SMOKE AND PERFUME TRIGGERS ASTHMA   Depression    GERD (gastroesophageal reflux disease)    Hyperlipidemia    Hypertension    IBS (irritable bowel syndrome)     Past Surgical History:  Procedure Laterality Date   ABDOMINAL HYSTERECTOMY  age 46   AXILLARY LYMPH NODE BIOPSY Right 08/03/2014   Procedure: EXCISION RIGHT  AXILLARY LIPOMATOSIS;  Surgeon: Nadeen Landau, MD;  Location: [facility];  Service: General;  Laterality: Right;   BREAST BIOPSY Left 10/07/2018   Stereo, Coil Clip, BENIGN BREAST TISSUE WITH AREAS CONTAINING A PREDOMINANCE OF ADIPOSE TISSUE   BREAST BIOPSY Left 10/21/2019   stereo, ribbon clip, path pending    COLONOSCOPY     DIAGNOSTIC LAPAROSCOPY     ESOPHAGOGASTRODUODENOSCOPY (EGD) WITH PROPOFOL N/A 01/26/2021   Procedure: ESOPHAGOGASTRODUODENOSCOPY (EGD) WITH PROPOFOL;  Surgeon: Regis Bill, MD;  Location: [facility];  Service: Endoscopy;  Laterality: N/A;   ESOPHAGOGASTRODUODENOSCOPY (EGD) WITH PROPOFOL N/A 04/17/2021   Procedure: ESOPHAGOGASTRODUODENOSCOPY (EGD) WITH PROPOFOL;  Surgeon: Regis Bill, MD;  Location: [facility];  Service: Endoscopy;  Laterality: N/A;   ESOPHAGOGASTRODUODENOSCOPY (EGD) WITH PROPOFOL N/A 05/02/2021   Procedure: ESOPHAGOGASTRODUODENOSCOPY (EGD) WITH PROPOFOL;  Surgeon: Regis Bill, MD;  Location: [facility];  Service: Endoscopy;  Laterality: N/A;   ESOPHAGOGASTRODUODENOSCOPY (EGD) WITH PROPOFOL N/A 06/06/2021   Procedure: ESOPHAGOGASTRODUODENOSCOPY (EGD) WITH PROPOFOL;  Surgeon: Regis Bill, MD;  Location: [facility];  Service: Endoscopy;  Laterality: N/A;   EXPLORATORY LAPAROTOMY     FOOT SURGERY      KYPHOPLASTY N/A 12/10/2014   Procedure: KYPHOPLASTY L 1;  Surgeon: Kennedy Bucker, MD;  Location: [facility];  Service: Orthopedics;  Laterality: N/A;   OOPHORECTOMY     TONSILLECTOMY      Prior to Admission medications   Medication Sig Start Date End Date Taking? Authorizing Provider  BLACK ELDERBERRY PO Take 1 capsule by mouth daily.    Yes [provider]  Black Pepper-Turmeric 3-500 MG CAPS Take by mouth.   Yes [provider]  co-enzyme Q-10 30 MG capsule Take 30 mg by mouth daily.   Yes [provider]  famotidine (PEPCID) 20 MG tablet Take by mouth.   Yes [provider]  levocetirizine (XYZAL) 5 MG tablet Take 1 tablet (5 mg total) by mouth every evening. Patient taking differently: Take 5 mg by mouth every morning. 06/15/21  Yes Berniece Salines, FNP  metoprolol tartrate (LOPRESSOR) 25 MG tablet Take 0.5 tablets (12.5 mg total) by mouth 2 (two) times daily. 06/15/21  Yes Berniece Salines, FNP  Multiple Vitamin (MULTIVITAMIN) capsule Take 1 capsule by mouth daily.   Yes [provider]  Omega-3 Fatty Acids (FISH OIL PO) Take 1 tablet by mouth daily.   Yes [provider]  pantoprazole (PROTONIX) 40 MG tablet Take 1 tablet by mouth 2 (two) times daily. 03/31/21  Yes [provider]  vitamin C (ASCORBIC ACID) 500 MG tablet Take 1,000 mg by mouth daily.    Yes [provider]  polyethylene glycol powder (GLYCOLAX/MIRALAX) 17 GM/SCOOP powder Take 17 g by mouth daily. 05/10/21   [provider]    Allergies as of 06/20/2021 -  Review Complete 06/15/2021  Allergen Reaction Noted   Indapamide Other (See Comments) 07/07/2014    Family History  Problem Relation Age of Onset   Heart disease Mother    Heart attack Mother    Cancer Father    Lung cancer Father    Breast cancer Neg Hx     Social History   Socioeconomic History   Marital status: Married    Spouse name: Not on file   Number of children: Not on file    Years of education: Not on file   Highest education level: Not on file  Occupational History   Not on file  Tobacco Use   Smoking status: Never    Passive exposure: Yes   Smokeless tobacco: Never   Tobacco comments:    Parents were smokers   Vaping Use   Vaping Use: Never used  Substance and Sexual Activity   Alcohol use: Not Currently   Drug use: No   Sexual activity: Not on file  Other Topics Concern   Not on file  Social History Narrative   Not on file   Social Determinants of Health   Financial Resource Strain: Not on file  Food Insecurity: Not on file  Transportation Needs: Not on file  Physical Activity: Not on file  Stress: Not on file  Social Connections: Not on file  Intimate Partner Violence: Not on file    Review of Systems: See HPI, otherwise negative ROS  Physical Exam: BP (!) 160/86   Pulse 79   Temp 98.2 F (36.8 C) (Temporal)   Ht 5\' 2"  (1.575 m)   Wt 53.5 kg   SpO2 100%   BMI 21.56 kg/m  General:   Alert, cooperative in NAD Head:  Normocephalic and atraumatic. Respiratory:  Normal work of breathing. Cardiovascular:  RRR  Impression/Plan: Angela Espinoza is here for cataract surgery.  Risks, benefits, limitations, and alternatives regarding cataract surgery have been reviewed with the patient.  Questions have been answered.  All parties agreeable.   Birder Robson, MD  10/24/2021, 12:53 PM

## 2021-10-25 ENCOUNTER — Other Ambulatory Visit: Payer: Self-pay | Admitting: Nurse Practitioner

## 2021-10-25 ENCOUNTER — Encounter: Payer: Self-pay | Admitting: Ophthalmology

## 2021-10-25 DIAGNOSIS — Z1231 Encounter for screening mammogram for malignant neoplasm of breast: Secondary | ICD-10-CM

## 2021-10-30 ENCOUNTER — Encounter: Payer: Self-pay | Admitting: Ophthalmology

## 2021-10-30 ENCOUNTER — Other Ambulatory Visit: Payer: Self-pay

## 2021-10-31 ENCOUNTER — Ambulatory Visit: Payer: Medicare HMO | Attending: Pain Medicine | Admitting: Pain Medicine

## 2021-10-31 ENCOUNTER — Encounter: Payer: Self-pay | Admitting: Pain Medicine

## 2021-10-31 ENCOUNTER — Ambulatory Visit: Payer: Medicare HMO

## 2021-10-31 VITALS — BP 153/82 | HR 87 | Temp 96.6°F | Resp 16 | Ht 62.0 in | Wt 113.0 lb

## 2021-10-31 DIAGNOSIS — G8929 Other chronic pain: Secondary | ICD-10-CM | POA: Diagnosis not present

## 2021-10-31 DIAGNOSIS — M7072 Other bursitis of hip, left hip: Secondary | ICD-10-CM | POA: Insufficient documentation

## 2021-10-31 DIAGNOSIS — M5137 Other intervertebral disc degeneration, lumbosacral region: Secondary | ICD-10-CM | POA: Diagnosis not present

## 2021-10-31 DIAGNOSIS — M47894 Other spondylosis, thoracic region: Secondary | ICD-10-CM | POA: Insufficient documentation

## 2021-10-31 DIAGNOSIS — S32010S Wedge compression fracture of first lumbar vertebra, sequela: Secondary | ICD-10-CM

## 2021-10-31 DIAGNOSIS — M5135 Other intervertebral disc degeneration, thoracolumbar region: Secondary | ICD-10-CM | POA: Diagnosis not present

## 2021-10-31 DIAGNOSIS — M47816 Spondylosis without myelopathy or radiculopathy, lumbar region: Secondary | ICD-10-CM | POA: Diagnosis not present

## 2021-10-31 DIAGNOSIS — M545 Low back pain, unspecified: Secondary | ICD-10-CM | POA: Diagnosis not present

## 2021-10-31 DIAGNOSIS — Z9889 Other specified postprocedural states: Secondary | ICD-10-CM | POA: Insufficient documentation

## 2021-10-31 NOTE — Progress Notes (Signed)
PROVIDER NOTE: Information contained herein reflects review and annotations entered in association with encounter. Interpretation of such information and data should be left to medically-trained personnel. Information provided to patient can be located elsewhere in the medical record under "Patient Instructions". Document created using STT-dictation technology, any transcriptional errors that may result from process are unintentional.    Patient: Angela Espinoza  Service Category: E/M  Provider: Gaspar Cola, MD  DOB: 01-16-1956  DOS: 10/31/2021  Referring Provider: Bo Merino, FNP  MRN: [id number]  Specialty: Interventional Pain Management  PCP: Bo Merino, FNP  Type: Established Patient  Setting: Ambulatory outpatient    Location: Office  Delivery: Face-to-face     HPI  Ms. Angela Espinoza, a 66 y.o. year old female, is here today because of her Chronic left-sided low back pain without sciatica [M54.50, G89.29]. [name] primary complain today is Back Pain (Left, buttock) Last encounter: My last encounter with her was on 08/21/2021. Pertinent problems: [name] has Chronic low back pain (1ry area of Pain) (Left) w/o sciatica; Chronic pain syndrome; History of L1 kyphoplasty; Traumatic compression fracture of L1 lumbar vertebra, sequela (2016); Spondylosis without myelopathy or radiculopathy, lumbar region; Lumbar facet syndrome (Left); DDD (degenerative disc disease), lumbosacral; Thoracic facet syndrome (Left); Spondylosis without myelopathy or radiculopathy, lumbosacral region; Spondylosis without myelopathy or radiculopathy, thoracic region; DDD (degenerative disc disease), thoracolumbar; Neurogenic pain; Other intervertebral disc degeneration, thoracic region; Other intervertebral disc degeneration, lumbar region; Gluteal pain (Left); Muscle pain, myofascial (Left buttocks); Sciatica of left side; and Ischial bursitis (Left) on their pertinent problem  list. Pain Assessment: Severity of Chronic pain is reported as a 8 /10. Location: Buttocks Left, Lower/left leg to just below the knee. Onset: More than a month ago. Quality: Aching, Throbbing. Timing: Constant. Modifying factor(s): nothing. Vitals:  height is _0  (1.575 m) and weight is 113 lb (51.3 kg). Her temporal temperature is 96.6 F (35.9 C) (abnormal). Her blood pressure is 153/82 (abnormal) and her pulse is 87. Her respiration is 16 and oxygen saturation is 100%.   Reason for encounter: evaluation of worsening, or previously known (established) problem.  Patient last seen on 08/21/2021 at which time she was having some left buttocks pain.  Since then, the patient has undergone a hospitalization secondary to a gastrointestinal bleed secondary to the use of meloxicam.  The patient describes having coded in the hospital and having being brought back.  About a week ago she underwent cataract surgery and is pending to have the opposite side done.  She denied having any pain in the lower back.  However, she has been having pain in the left lower extremity.  This particular pain seems to radiate down the back of the left leg to the mid calf region.  On physical exam, the patient has exquisite tenderness to palpation over the left ischial bursa/tuberosity.  Today we will be scheduling her to return for a left ischial bursa injection.  However, the patient is scheduled to undergo cataract surgery next Tuesday.  I have recommended to the patient to check with her surgeon to see if it is okay by them to receive steroids around the time of the surgery since steroids are known to decrease the immune system increasing the risk of infections and also they are known to slow down healing.  She is pending to see the surgeon tomorrow and she indicated that she will ask them.  If the surgeon is okay with it, then we can bring  her back and do the injection.  Pharmacotherapy Assessment  Analgesic: No opioid analgesics  from our practice. MME/day: 0 mg/day   Monitoring: [place] PMP: PDMP reviewed during this encounter.       Pharmacotherapy: No side-effects or adverse reactions reported. Compliance: No problems identified. Effectiveness: Clinically acceptable.  No notes on file  No results found for: "CBDTHCR" No results found for: "D8THCCBX" No results found for: "D9THCCBX"  UDS:  Summary  Date Value Ref Range Status  12/10/2017 FINAL  Final    Comment:    ==================================================================== TOXASSURE COMP DRUG ANALYSIS,UR ==================================================================== Test                             Result       Flag       Units Drug Present and Declared for Prescription Verification   Cyclobenzaprine                PRESENT      EXPECTED   Amitriptyline                  PRESENT      EXPECTED   Metoprolol                     PRESENT      EXPECTED Drug Absent but Declared for Prescription Verification   Desmethylcyclobenzaprine       Not Detected UNEXPECTED   Duloxetine                     Not Detected UNEXPECTED ==================================================================== Test                      Result    Flag   Units      Ref Range   Creatinine              28               mg/dL      >=20 ==================================================================== Declared Medications:  The flagging and interpretation on this report are based on the  following declared medications.  Unexpected results may arise from  inaccuracies in the declared medications.  **Note: The testing scope of this panel includes these medications:  Amitriptyline  Cyclobenzaprine  Duloxetine  Metoprolol  **Note: The testing scope of this panel does not include following  reported medications:  Cholecalciferol  Hydrochlorothiazide  Levocetirizine  Losartan (Losartan Potassium)  Magnesium  Meloxicam  Multivitamin  Supplement  Supplement (Omega-3)   Vitamin B (Biotin)  Vitamin C ==================================================================== For clinical consultation, please call [phone]. ====================================================================       ROS  Constitutional: Denies any fever or chills Gastrointestinal: No reported hemesis, hematochezia, vomiting, or acute GI distress Musculoskeletal: Denies any acute onset joint swelling, redness, loss of ROM, or weakness Neurological: No reported episodes of acute onset apraxia, aphasia, dysarthria, agnosia, amnesia, paralysis, loss of coordination, or loss of consciousness  Medication Review  Black Elderberry, Black Pepper-Turmeric, Omega-3 Fatty Acids, Sodium Fluoride, ascorbic acid, co-enzyme Q-10, famotidine, levocetirizine, metoprolol tartrate, multivitamin, pantoprazole, and polyethylene glycol powder  History Review  Allergy: Ms. Forinash is allergic to mobic [meloxicam] and indapamide. Drug: Ms. Mossa  reports no history of drug use. Alcohol:  reports that she does not currently use alcohol. Tobacco:  reports that she has never smoked. She has been exposed to tobacco smoke. She has never used smokeless tobacco. Social: Ms. Renfrew  reports that she has never smoked. She has been exposed to tobacco smoke. She has never used smokeless tobacco. She reports that she does not currently use alcohol. She reports that she does not use drugs. Medical:  has a past medical history of Anxiety, Asthma, Depression, GERD (gastroesophageal reflux disease), Hyperlipidemia, Hypertension, and IBS (irritable bowel syndrome). Surgical: Ms. Endres  has a past surgical history that includes Oophorectomy; Exploratory laparotomy; Foot surgery; Colonoscopy; Tonsillectomy; Axillary lymph node biopsy (Right, 08/03/2014); Kyphoplasty (N/A, 12/10/2014); Abdominal hysterectomy (age 21); Breast biopsy (Left, 10/07/2018); Breast biopsy (Left, 10/21/2019);  Esophagogastroduodenoscopy (egd) with propofol (N/A, 01/26/2021); Esophagogastroduodenoscopy (egd) with propofol (N/A, 04/17/2021); Diagnostic laparoscopy; Esophagogastroduodenoscopy (egd) with propofol (N/A, 05/02/2021); Esophagogastroduodenoscopy (egd) with propofol (N/A, 06/06/2021); and Cataract extraction w/PHACO (Left, 10/24/2021). Family: family history includes Cancer in her father; Heart attack in her mother; Heart disease in her mother; Lung cancer in her father.  Laboratory Chemistry Profile   Renal Lab Results  Component Value Date   BUN 12 09/27/2021   CREATININE 0.81 09/27/2021   BCR SEE NOTE: 09/27/2021   GFRAA 81 01/01/2018   GFRNONAA >60 01/31/2021    Hepatic Lab Results  Component Value Date   AST 21 09/27/2021   ALT 12 09/27/2021   ALBUMIN 2.2 (L) 01/26/2021   ALKPHOS 46 01/26/2021   LIPASE 27 01/26/2021    Electrolytes Lab Results  Component Value Date   NA 136 09/27/2021   K 3.9 09/27/2021   CL 98 09/27/2021   CALCIUM 9.9 09/27/2021   MG 1.9 01/30/2021   PHOS 2.3 (L) 01/30/2021    Bone Lab Results  Component Value Date   25OHVITD1 40 01/01/2018   25OHVITD2 <1.0 01/01/2018   25OHVITD3 40 01/01/2018    Inflammation (CRP: Acute Phase) (ESR: Chronic Phase) Lab Results  Component Value Date   CRP 1 12/10/2017   ESRSEDRATE 3 12/10/2017   LATICACIDVEN 0.9 01/26/2021         Note: Above Lab results reviewed.  Recent Imaging Review  CT ABDOMEN PELVIS W CONTRAST CLINICAL DATA:  Left lower quadrant abdominal pain.  EXAM: CT ABDOMEN AND PELVIS WITH CONTRAST  TECHNIQUE: Multidetector CT imaging of the abdomen and pelvis was performed using the standard protocol following bolus administration of intravenous contrast.  RADIATION DOSE REDUCTION: This exam was performed according to the departmental dose-optimization program which includes automated exposure control, adjustment of the mA and/or kV according to patient size and/or use of iterative  reconstruction technique.  CONTRAST:  174mL OMNIPAQUE IOHEXOL 300 MG/ML  SOLN  COMPARISON:  CT of the chest, abdomen pelvis dated 08/26/2020.  FINDINGS: Lower chest: The visualized lung bases are clear.  No intra-abdominal free air or free fluid.  Hepatobiliary: No focal liver abnormality is seen. No gallstones, gallbladder wall thickening, or biliary dilatation.  Pancreas: Mild fatty atrophy of the pancreas. No active inflammatory changes. No dilatation of the main pancreatic duct.  Spleen: Normal in size without focal abnormality.  Adrenals/Urinary Tract: The adrenal glands are unremarkable. There is no hydronephrosis on either side. There is symmetric enhancement and excretion of contrast by both kidneys. The visualized ureters and urinary bladder appear unremarkable.  Stomach/Bowel: Oral contrast opacifies the stomach and loops of small bowel and traverses into the colon. A 1 cm fatty lesion within the gastric fundus may represent an ingested fat and less likely a lipoma. There is no bowel obstruction or active inflammation. Several sigmoid diverticula without active inflammatory changes. The appendix is not visualized with certainty. No inflammatory changes identified  in the right lower quadrant.  Vascular/Lymphatic: Mild aortoiliac atherosclerotic disease. The IVC is unremarkable. No portal venous gas. There is no adenopathy.  Reproductive: Hysterectomy.  No adnexal masses.  Other: None  Musculoskeletal: Old L1 compression fracture and vertebroplasty. No acute osseous pathology.  IMPRESSION: 1. No acute intra-abdominal or pelvic pathology. 2. Sigmoid diverticulosis. No bowel obstruction. 3. Aortic Atherosclerosis (ICD10-I70.0).  Electronically Signed   By: Anner Crete M.D.   On: 10/20/2021 21:16 Note: Reviewed        Physical Exam  General appearance: Well nourished, well developed, and well hydrated. In no apparent acute distress Mental status: Alert,  oriented x 3 (person, place, & time)       Respiratory: No evidence of acute respiratory distress Eyes: PERLA Vitals: BP (!) 153/82   Pulse 87   Temp (!) 96.6 F (35.9 C) (Temporal)   Resp 16   Ht _0  (1.575 m)   Wt 113 lb (51.3 kg)   SpO2 100%   BMI 20.67 kg/m  BMI: Estimated body mass index is 20.67 kg/m as calculated from the following:   Height as of this encounter: _1  (1.575 m).   Weight as of this encounter: 113 lb (51.3 kg). Ideal: Ideal body weight: 50.1 kg (110 lb 7.2 oz) Adjusted ideal body weight: 50.6 kg (111 lb 7.5 oz)  Assessment   Diagnosis Status  1. Chronic low back pain (1ry area of Pain) (Left) w/o sciatica   2. DDD (degenerative disc disease), thoracolumbar   3. DDD (degenerative disc disease), lumbosacral   4. History of L1 kyphoplasty   5. Traumatic compression fracture of L1 lumbar vertebra, sequela (2016)   6. Thoracic facet syndrome (Left)   7. Lumbar facet syndrome (Left)   8. Ischial bursitis (Left)    Controlled Controlled Controlled   Updated Problems: Problem  Ischial bursitis (Left)  Sciatica of Left Side  Syncope and Collapse  Seasonal Allergic Rhinitis Due to Pollen  Duodenal Ulcer  Pyloric Stenosis  History of GI Bleed     Plan of Care  Problem-specific:  No problem-specific Assessment & Plan notes found for this encounter.  Ms. Elianah Karis has a current medication list which includes the following long-term medication(s): levocetirizine, metoprolol tartrate, and pantoprazole.  Pharmacotherapy (Medications Ordered): No orders of the defined types were placed in this encounter.  Orders:  Orders Placed This Encounter  Procedures   Injection tendon or ligament    Standing Status:   Standing    Number of Occurrences:   1    Standing Expiration Date:   01/31/2022    Scheduling Instructions:     Type of Block:  Buttocks injection (left ischial bursa injection)     Side: Left-sided     Sedation: No Sedation.      Timeframe: PRN Procedure. Patient will call.   Follow-up plan:   Return for procedure (Clinic): (L) Ischial Bursa inj. #1 .     Interventional Therapies  Risk  Complexity Considerations:   WNL   Planned  Pending:   Diagnostic/therapeutic left ischial bursa injection #1    Under consideration:   Diagnostic left L1-2 LESI #1    Completed:   Diagnostic left thoracolumbar facet MBB (T12, L1, L2, & L3) block x2  Therapeutic left thoracolumbar (T12, L1, L2, & L3) MB RFA x2 (04/26/2020) (100/100/80/80)  Diagnostic left lumbar facet MBB x2 (03/29/2020) (100/100/60/60)  Diagnostic left lumbar facet (L4, L5, S1) MBB x1 (12/27/2020) (100/100/80/100) until 6/23. Therapeutic left lumbar (  L4, L5, S1) MB RFA x1 (07/28/2019) (100/100/100/100)  Note: The patient has indicated that she has had prior lumbar facet radiofrequency ablations at [facility].    Palliative options:   Palliative left thoracolumbar (T12, L1, L2, & L3) MB RFA #2  Palliative left lumbar (L4, L5, S1) MB RFA #2     Recent Visits Date Type Provider Dept  08/21/21 Office Visit Milinda Pointer, MD [facility]  Showing recent visits within past 90 days and meeting all other requirements Today's Visits Date Type Provider Dept  10/31/21 Office Visit Milinda Pointer, MD [facility]  Showing today's visits and meeting all other requirements Future Appointments Date Type Provider Dept  11/23/21 Appointment Milinda Pointer, MD [facility]  Showing future appointments within next 90 days and meeting all other requirements  I discussed the assessment and treatment plan with the patient. The patient was provided an opportunity to ask questions and all were answered. The patient agreed with the plan and demonstrated an understanding of the instructions.  Patient advised to call back or seek an in-person evaluation if the symptoms or condition worsens.  Duration of encounter: 30 minutes.  Total time on  encounter, as per AMA guidelines included both the face-to-face and non-face-to-face time personally spent by the physician and/or other qualified health care professional(s) on the day of the encounter (includes time in activities that require the physician or other qualified health care professional and does not include time in activities normally performed by clinical staff). Physician's time may include the following activities when performed: preparing to see the patient (eg, review of tests, pre-charting review of records) obtaining and/or reviewing separately obtained history performing a medically appropriate examination and/or evaluation counseling and educating the patient/family/caregiver ordering medications, tests, or procedures referring and communicating with other health care professionals (when not separately reported) documenting clinical information in the electronic or other health record independently interpreting results (not separately reported) and communicating results to the patient/ family/caregiver care coordination (not separately reported)  Note by: Gaspar Cola, MD Date: 10/31/2021; Time: 2:57 PM

## 2021-10-31 NOTE — Patient Instructions (Addendum)
______________________________________________________________________  Preparing for your procedure (without sedation)  Procedure appointments are limited to planned procedures: No Prescription Refills. No disability issues will be discussed. No medication changes will be discussed.  Instructions: Food Intake: Avoid eating anything for at least 4 hours prior to your procedure. Transportation: Unless otherwise stated by your physician, bring a driver. Morning Medicines: Take all of your scheduled morning medications. If you take heart medicine, except for blood thinners, do not forget to take it the morning of the procedure. If your Diastolic (lower reading) is above 100 mmHg, elective cases will be cancelled/rescheduled. Blood thinners: These will need to be stopped for procedures. Notify our staff if you are taking any blood thinners. Depending on which one you take, there will be specific instructions on how and when to stop it. Diabetics on insulin: Notify the staff so that you can be scheduled 1st case in the morning. If your diabetes requires high dose insulin, take only  of your normal insulin dose the morning of the procedure and notify the staff that you have done so. Preventing infections: Shower with an antibacterial soap the morning of your procedure.  Build-up your immune system: Take 1000 mg of Vitamin C with every meal (3 times a day) the day prior to your procedure. Antibiotics: Inform the staff if you have a condition or reason that requires you to take antibiotics before dental procedures. Pregnancy: If you are pregnant, call and cancel the procedure. Sickness: If you have a cold, fever, or any active infections, call and cancel the procedure. Arrival: You must be in the facility at least 30 minutes prior to your scheduled procedure. Children: Do not bring any children with you. Dress appropriately: There is always a possibility that your clothing may get soiled. Valuables:  Do not bring any jewelry or valuables.  Reasons to call and reschedule or cancel your procedure: (Following these recommendations will minimize the risk of a serious complication.) Surgeries: Avoid having procedures within 2 weeks of any surgery. (Avoid for 2 weeks before or after any surgery). Flu Shots: Avoid having procedures within 2 weeks of a flu shots or . (Avoid for 2 weeks before or after immunizations). Barium: Avoid having a procedure within 7-10 days after having had a radiological study involving the use of radiological contrast. (Myelograms, Barium swallow or enema study). Heart attacks: Avoid any elective procedures or surgeries for the initial 6 months after a "Myocardial Infarction" (Heart Attack). Blood thinners: It is imperative that you stop these medications before procedures. Let us know if you if you take any blood thinner.  Infection: Avoid procedures during or within two weeks of an infection (including chest colds or gastrointestinal problems). Symptoms associated with infections include: Localized redness, fever, chills, night sweats or profuse sweating, burning sensation when voiding, cough, congestion, stuffiness, runny nose, sore throat, diarrhea, nausea, vomiting, cold or Flu symptoms, recent or current infections. It is specially important if the infection is over the area that we intend to treat. Heart and lung problems: Symptoms that may suggest an active cardiopulmonary problem include: cough, chest pain, breathing difficulties or shortness of breath, dizziness, ankle swelling, uncontrolled high or unusually low blood pressure, and/or palpitations. If you are experiencing any of these symptoms, cancel your procedure and contact your primary care physician for an evaluation.  Remember:  Regular Business hours are:  Monday to Thursday 8:00 AM to 4:00 PM  Provider's Schedule: Milinda Pointer, MD:  Procedure days: Tuesday and Thursday 7:30 AM to 4:00 PM  Bilal  Lateef, MD:  Procedure days: Monday and Wednesday 7:30 AM to 4:00 PM ______________________________________________________________________  ____________________________________________________________________________________________  General Risks and Possible Complications  Patient Responsibilities: It is important that you read this as it is part of your informed consent. It is our duty to inform you of the risks and possible complications associated with treatments offered to you. It is your responsibility as a patient to read this and to ask questions about anything that is not clear or that you believe was not covered in this document.  Patient's Rights: You have the right to refuse treatment. You also have the right to change your mind, even after initially having agreed to have the treatment done. However, under this last option, if you wait until the last second to change your mind, you may be charged for the materials used up to that point.  Introduction: Medicine is not an exact science. Everything in Medicine, including the lack of treatment(s), carries the potential for danger, harm, or loss (which is by definition: Risk). In Medicine, a complication is a secondary problem, condition, or disease that can aggravate an already existing one. All treatments carry the risk of possible complications. The fact that a side effects or complications occurs, does not imply that the treatment was conducted incorrectly. It must be clearly understood that these can happen even when everything is done following the highest safety standards.  No treatment: You can choose not to proceed with the proposed treatment alternative. The "PRO(s)" would include: avoiding the risk of complications associated with the therapy. The "CON(s)" would include: not getting any of the treatment benefits. These benefits fall under one of three categories: diagnostic; therapeutic; and/or palliative. Diagnostic benefits  include: getting information which can ultimately lead to improvement of the disease or symptom(s). Therapeutic benefits are those associated with the successful treatment of the disease. Finally, palliative benefits are those related to the decrease of the primary symptoms, without necessarily curing the condition (example: decreasing the pain from a flare-up of a chronic condition, such as incurable terminal cancer).  General Risks and Complications: These are associated to most interventional treatments. They can occur alone, or in combination. They fall under one of the following six (6) categories: no benefit or worsening of symptoms; bleeding; infection; nerve damage; allergic reactions; and/or death. No benefits or worsening of symptoms: In Medicine there are no guarantees, only probabilities. No healthcare provider can ever guarantee that a medical treatment will work, they can only state the probability that it may. Furthermore, there is always the possibility that the condition may worsen, either directly, or indirectly, as a consequence of the treatment. Bleeding: This is more common if the patient is taking a blood thinner, either prescription or over the counter (example: Goody Powders, Fish oil, Aspirin, Garlic, etc.), or if suffering a condition associated with impaired coagulation (example: Hemophilia, cirrhosis of the liver, low platelet counts, etc.). However, even if you do not have one on these, it can still happen. If you have any of these conditions, or take one of these drugs, make sure to notify your treating physician. Infection: This is more common in patients with a compromised immune system, either due to disease (example: diabetes, cancer, human immunodeficiency virus [HIV], etc.), or due to medications or treatments (example: therapies used to treat cancer and rheumatological diseases). However, even if you do not have one on these, it can still happen. If you have any of these  conditions, or take one of these drugs, make   sure to notify your treating physician. Nerve Damage: This is more common when the treatment is an invasive one, but it can also happen with the use of medications, such as those used in the treatment of cancer. The damage can occur to small secondary nerves, or to large primary ones, such as those in the spinal cord and brain. This damage may be temporary or permanent and it may lead to impairments that can range from temporary numbness to permanent paralysis and/or brain death. Allergic Reactions: Any time a substance or material comes in contact with our body, there is the possibility of an allergic reaction. These can range from a mild skin rash (contact dermatitis) to a severe systemic reaction (anaphylactic reaction), which can result in death. Death: In general, any medical intervention can result in death, most of the time due to an unforeseen complication. ____________________________________________________________________________________________ Preparing for your procedure (without sedation) Instructions: Oral Intake: Do not eat or drink anything for at least 3 hours prior to your procedure. Transportation: Unless otherwise stated by your physician, you may drive yourself after the procedure. Blood Pressure Medicine: Take your blood pressure medicine with a sip of water the morning of the procedure. Insulin: Take only  of your normal insulin dose. Preventing infections: Shower with an antibacterial soap the morning of your procedure. Build-up your immune system: Take 1000 mg of Vitamin C with every meal (3 times a day) the day prior to your procedure. Pregnancy: If you are pregnant, call and cancel the procedure. Sickness: If you have a cold, fever, or any active infections, call and cancel the procedure. Arrival: You must be in the facility at least 30 minutes prior to your scheduled procedure. Children: Do not bring any children with  you. Dress appropriately: Bring dark clothing that you would not mind if they get stained. Valuables: Do not bring any jewelry or valuables. Procedure appointments are reserved for interventional treatments only. No Prescription Refills. No medication changes will be discussed during procedure appointments. No disability issues will be discussed.

## 2021-11-01 ENCOUNTER — Other Ambulatory Visit: Payer: Self-pay | Admitting: Nurse Practitioner

## 2021-11-01 ENCOUNTER — Telehealth: Payer: Self-pay | Admitting: Emergency Medicine

## 2021-11-01 ENCOUNTER — Ambulatory Visit: Payer: Self-pay

## 2021-11-01 DIAGNOSIS — M7072 Other bursitis of hip, left hip: Secondary | ICD-10-CM

## 2021-11-01 DIAGNOSIS — H2511 Age-related nuclear cataract, right eye: Secondary | ICD-10-CM | POA: Diagnosis not present

## 2021-11-01 MED ORDER — ACETAMINOPHEN-CODEINE 300-30 MG PO TABS: 1.0000 | ORAL_TABLET | Freq: Four times a day (QID) | ORAL | 0 refills | Status: DC | PRN

## 2021-11-01 NOTE — Telephone Encounter (Signed)
Pt has returned call to [facility], told pt script was given but it is in print status not sent to her pharmacy. She is asking that it be sent to the pharmacy below:  acetaminophen-codeine (TYLENOL #3) 300-30 MG tablet 15 tablet 0 11/01/2021    Sig - Route: Take 1-2 tablets by mouth every 6 (six) hours as needed for moderate pain. - Oral   Class: Print       [id number] - [place], [place] Phone:  [phone]  Fax:  [phone]

## 2021-11-01 NOTE — Telephone Encounter (Signed)
Left message for patient to call office.  

## 2021-11-01 NOTE — Telephone Encounter (Signed)
Left message for patient that a 1 time script has been called in

## 2021-11-01 NOTE — Telephone Encounter (Signed)
FYI

## 2021-11-01 NOTE — Telephone Encounter (Signed)
Patient stated she had her 1st eye surgery on 9/26 and scheduled again for 10/10. Patient stated she has is still in pain and has seen PT for 2 months. She stated she did not want another referral to Dr.Matthews because she has to many appointments now.

## 2021-11-01 NOTE — Telephone Encounter (Signed)
Left message for patient

## 2021-11-01 NOTE — Telephone Encounter (Signed)
   Chief Complaint: Seen by [facility], told yesterday she has bursitis left hip. Cannot have an injection for pain due to up coming eye surgery . Taking Tylenol OTC. Asking if PCP can prescribe anything , short term, until she can receive steroid injection. Symptoms: Pain 8/10 Frequency:  Pertinent Negatives: Patient denies  Disposition: [] ED /[] Urgent Care (no appt availability in office) / [] Appointment(In office/virtual)/ []  [facility]/ [] Home Care/ [] Refused Recommended Disposition /[] [facility]/ [x]  Follow-up with PCP Additional Notes: Please advise pt.  Answer Assessment - Initial Assessment Questions 1. ONSET: "When did the pain begin?"      Months 2. LOCATION: "Where does it hurt?" (upper, mid or lower back)     Low back 3. SEVERITY: "How bad is the pain?"  (e.g., Scale 1-10; mild, moderate, or severe)   - MILD (1-3): Doesn't interfere with normal activities.    - MODERATE (4-7): Interferes with normal activities or awakens from sleep.    - SEVERE (8-10): Excruciating pain, unable to do any normal activities.      8 4. PATTERN: "Is the pain constant?" (e.g., yes, no; constant, intermittent)      Comes and goes 5. RADIATION: "Does the pain shoot into your legs or somewhere else?"     Left buttock 6. CAUSE:  "What do you think is causing the back pain?"      Bursitis  7. BACK OVERUSE:  "Any recent lifting of heavy objects, strenuous work or exercise?"     No 8. MEDICINES: "What have you taken so far for the pain?" (e.g., nothing, acetaminophen, NSAIDS)     Tylenol 9. NEUROLOGIC SYMPTOMS: "Do you have any weakness, numbness, or problems with bowel/bladder control?"     No 10. OTHER SYMPTOMS: "Do you have any other symptoms?" (e.g., fever, abdomen pain, burning with urination, blood in urine)       No 11. PREGNANCY: "Is there any chance you are pregnant?" "When was your last menstrual period?"       No  Protocols used: Back Pain-A-AH

## 2021-11-02 ENCOUNTER — Telehealth: Payer: Self-pay | Admitting: Pain Medicine

## 2021-11-02 NOTE — Telephone Encounter (Signed)
Spoke to [place] she sent in a new script on 10/4

## 2021-11-02 NOTE — Telephone Encounter (Signed)
PT stated that she went to her eye doctor appt and was giving the okay to have the procedures done on 11-23-21. PT stated that if any questions you can give her an call a [phone]. Thanks

## 2021-11-03 NOTE — Discharge Instructions (Signed)

## 2021-11-07 ENCOUNTER — Encounter
Admission: RE | Disposition: A | Discharge status: Home / Self Care | Payer: Self-pay | Source: Ambulatory Visit | Attending: Ophthalmology

## 2021-11-07 ENCOUNTER — Ambulatory Visit: Payer: Medicare HMO | Admitting: Anesthesiology

## 2021-11-07 ENCOUNTER — Encounter: Payer: Self-pay | Admitting: Ophthalmology

## 2021-11-07 ENCOUNTER — Other Ambulatory Visit: Payer: Self-pay

## 2021-11-07 ENCOUNTER — Ambulatory Visit
Admission: RE | Admit: 2021-11-07 | Discharge: 2021-11-07 | Disposition: A | Discharge status: Home / Self Care | Payer: Medicare HMO | Source: Ambulatory Visit | Attending: Ophthalmology | Admitting: Ophthalmology

## 2021-11-07 DIAGNOSIS — I10 Essential (primary) hypertension: Secondary | ICD-10-CM | POA: Diagnosis not present

## 2021-11-07 DIAGNOSIS — E785 Hyperlipidemia, unspecified: Secondary | ICD-10-CM | POA: Insufficient documentation

## 2021-11-07 DIAGNOSIS — F419 Anxiety disorder, unspecified: Secondary | ICD-10-CM | POA: Diagnosis not present

## 2021-11-07 DIAGNOSIS — J45909 Unspecified asthma, uncomplicated: Secondary | ICD-10-CM | POA: Insufficient documentation

## 2021-11-07 DIAGNOSIS — H2511 Age-related nuclear cataract, right eye: Secondary | ICD-10-CM | POA: Insufficient documentation

## 2021-11-07 DIAGNOSIS — H269 Unspecified cataract: Secondary | ICD-10-CM | POA: Diagnosis not present

## 2021-11-07 DIAGNOSIS — K279 Peptic ulcer, site unspecified, unspecified as acute or chronic, without hemorrhage or perforation: Secondary | ICD-10-CM | POA: Diagnosis not present

## 2021-11-07 DIAGNOSIS — K219 Gastro-esophageal reflux disease without esophagitis: Secondary | ICD-10-CM | POA: Insufficient documentation

## 2021-11-07 DIAGNOSIS — K589 Irritable bowel syndrome without diarrhea: Secondary | ICD-10-CM | POA: Insufficient documentation

## 2021-11-07 DIAGNOSIS — F32A Depression, unspecified: Secondary | ICD-10-CM | POA: Insufficient documentation

## 2021-11-07 DIAGNOSIS — R69 Illness, unspecified: Secondary | ICD-10-CM | POA: Diagnosis not present

## 2021-11-07 HISTORY — PX: CATARACT EXTRACTION W/PHACO: SHX586

## 2021-11-07 SURGERY — PHACOEMULSIFICATION, CATARACT, WITH IOL INSERTION
Anesthesia: Monitor Anesthesia Care | Site: Eye | Laterality: Right

## 2021-11-07 MED ORDER — MIDAZOLAM HCL 2 MG/2ML IJ SOLN
INTRAMUSCULAR | Status: DC | PRN
  Administered 2021-11-07: 2 mg via INTRAVENOUS

## 2021-11-07 MED ORDER — LACTATED RINGERS IV SOLN: INTRAVENOUS | Status: DC

## 2021-11-07 MED ORDER — SIGHTPATH DOSE#1 BSS IO SOLN
INTRAOCULAR | Status: DC | PRN
  Administered 2021-11-07: 15 mL

## 2021-11-07 MED ORDER — SIGHTPATH DOSE#1 NA CHONDROIT SULF-NA HYALURON 40-17 MG/ML IO SOLN
INTRAOCULAR | Status: DC | PRN
  Administered 2021-11-07: 1 mL via INTRAOCULAR

## 2021-11-07 MED ORDER — SIGHTPATH DOSE#1 BSS IO SOLN
INTRAOCULAR | Status: DC | PRN
  Administered 2021-11-07: 1 mL

## 2021-11-07 MED ORDER — ARMC OPHTHALMIC DILATING DROPS
1.0000 | OPHTHALMIC | Status: DC | PRN
  Administered 2021-11-07 (×3): 1 via OPHTHALMIC

## 2021-11-07 MED ORDER — BRIMONIDINE TARTRATE-TIMOLOL 0.2-0.5 % OP SOLN
OPHTHALMIC | Status: DC | PRN
  Administered 2021-11-07: 1 [drp] via OPHTHALMIC

## 2021-11-07 MED ORDER — TETRACAINE HCL 0.5 % OP SOLN
1.0000 [drp] | OPHTHALMIC | Status: DC | PRN
  Administered 2021-11-07 (×3): 1 [drp] via OPHTHALMIC

## 2021-11-07 MED ORDER — MOXIFLOXACIN HCL 0.5 % OP SOLN
OPHTHALMIC | Status: DC | PRN
  Administered 2021-11-07: 0.2 mL via OPHTHALMIC

## 2021-11-07 MED ORDER — SIGHTPATH DOSE#1 BSS IO SOLN
INTRAOCULAR | Status: DC | PRN
  Administered 2021-11-07: 56 mL via OPHTHALMIC

## 2021-11-07 MED ORDER — FENTANYL CITRATE (PF) 100 MCG/2ML IJ SOLN
INTRAMUSCULAR | Status: DC | PRN
  Administered 2021-11-07 (×4): 25 ug via INTRAVENOUS

## 2021-11-07 SURGICAL SUPPLY — 17 items
CANNULA ANT/CHMB 27G (MISCELLANEOUS) IMPLANT
CANNULA ANT/CHMB 27GA (MISCELLANEOUS) IMPLANT
CATARACT SUITE SIGHTPATH (MISCELLANEOUS) ×1 IMPLANT
FEE CATARACT SUITE SIGHTPATH (MISCELLANEOUS) ×1 IMPLANT
GLOVE SURG ENC TEXT LTX SZ8 (GLOVE) ×1 IMPLANT
GLOVE SURG TRIUMPH 8.0 PF LTX (GLOVE) ×1 IMPLANT
LENS IOL ACRSF VT TRC 315 20.5 IMPLANT
LENS IOL ACRYSOF VIVITY 20.5 ×1 IMPLANT
LENS IOL VIVITY 315 20.5 ×1 IMPLANT
NDL FILTER BLUNT 18X1 1/2 (NEEDLE) ×1 IMPLANT
NEEDLE FILTER BLUNT 18X1 1/2 (NEEDLE) ×1 IMPLANT
PACK VIT ANT 23G (MISCELLANEOUS) IMPLANT
RING MALYGIN (MISCELLANEOUS) IMPLANT
SUT ETHILON 10-0 CS-B-6CS-B-6 (SUTURE)
SUTURE EHLN 10-0 CS-B-6CS-B-6 (SUTURE) IMPLANT
SYR 3ML LL SCALE MARK (SYRINGE) ×1 IMPLANT
WATER STERILE IRR 250ML POUR (IV SOLUTION) ×1 IMPLANT

## 2021-11-07 NOTE — Anesthesia Postprocedure Evaluation (Signed)
Anesthesia Post Note  Patient: Casandra Dallaire Ferrufino  Procedure(s) Performed: CATARACT EXTRACTION PHACO AND INTRAOCULAR LENS PLACEMENT (IOC) RIGHT VIVITY TORIC  LENS (Right: Eye)  Patient location during evaluation: Phase II Anesthesia Type: MAC Level of consciousness: awake and alert Pain management: pain level controlled Vital Signs Assessment: post-procedure vital signs reviewed and stable Respiratory status: spontaneous breathing, nonlabored ventilation, respiratory function stable and patient connected to nasal cannula oxygen Cardiovascular status: stable and blood pressure returned to baseline Postop Assessment: no apparent nausea or vomiting Anesthetic complications: no   No notable events documented.   Last Vitals:  Vitals:   11/07/21 0822 11/07/21 0826  BP: 108/65 105/68  Pulse: 77 75  Resp: 14   Temp: 36.7 C 36.7 C  SpO2: 98% 100%    Last Pain:  Vitals:   11/07/21 0826  TempSrc:   PainSc: 0-No pain                 Precious Haws Sueko Dimichele

## 2021-11-07 NOTE — Transfer of Care (Signed)
Immediate Anesthesia Transfer of Care Note  Patient: Angela Espinoza  Procedure(s) Performed: CATARACT EXTRACTION PHACO AND INTRAOCULAR LENS PLACEMENT (IOC) RIGHT VIVITY TORIC  LENS (Right: Eye)  Patient Location: PACU  Anesthesia Type: MAC  Level of Consciousness: awake, alert  and patient cooperative  Airway and Oxygen Therapy: Patient Spontanous Breathing and Patient connected to supplemental oxygen  Post-op Assessment: Post-op Vital signs reviewed, Patient's Cardiovascular Status Stable, Respiratory Function Stable, Patent Airway and No signs of Nausea or vomiting  Post-op Vital Signs: Reviewed and stable  Complications: No notable events documented.

## 2021-11-07 NOTE — H&P (Signed)
[facility]   Primary Care Physician:  Bo Merino, FNP Ophthalmologist: Dr. Cammy Brochure  Pre-Procedure History & Physical: HPI:  Angela Espinoza is a 66 y.o. female here for cataract surgery.   Past Medical History:  Diagnosis Date   Anxiety    Asthma    NO INHALER-SMOKE AND PERFUME TRIGGERS ASTHMA   Depression    GERD (gastroesophageal reflux disease)    Hyperlipidemia    Hypertension    IBS (irritable bowel syndrome)     Past Surgical History:  Procedure Laterality Date   ABDOMINAL HYSTERECTOMY  age 68   AXILLARY LYMPH NODE BIOPSY Right 08/03/2014   Procedure: EXCISION RIGHT  AXILLARY LIPOMATOSIS;  Surgeon: Leonie Green, MD;  Location: [facility];  Service: General;  Laterality: Right;   BREAST BIOPSY Left 10/07/2018   Stereo, Coil Clip, BENIGN BREAST TISSUE WITH AREAS CONTAINING A PREDOMINANCE OF ADIPOSE TISSUE   BREAST BIOPSY Left 10/21/2019   stereo, ribbon clip, path pending    CATARACT EXTRACTION W/PHACO Left 10/24/2021   Procedure: CATARACT EXTRACTION PHACO AND INTRAOCULAR LENS PLACEMENT (IOC) LEFT VIVITY TORIC LENS 7.91 00:41.6;  Surgeon: Birder Robson, MD;  Location: [facility];  Service: Ophthalmology;  Laterality: Left;   COLONOSCOPY     DIAGNOSTIC LAPAROSCOPY     ESOPHAGOGASTRODUODENOSCOPY (EGD) WITH PROPOFOL N/A 01/26/2021   Procedure: ESOPHAGOGASTRODUODENOSCOPY (EGD) WITH PROPOFOL;  Surgeon: Lesly Rubenstein, MD;  Location: [facility];  Service: Endoscopy;  Laterality: N/A;   ESOPHAGOGASTRODUODENOSCOPY (EGD) WITH PROPOFOL N/A 04/17/2021   Procedure: ESOPHAGOGASTRODUODENOSCOPY (EGD) WITH PROPOFOL;  Surgeon: Lesly Rubenstein, MD;  Location: [facility];  Service: Endoscopy;  Laterality: N/A;   ESOPHAGOGASTRODUODENOSCOPY (EGD) WITH PROPOFOL N/A 05/02/2021   Procedure: ESOPHAGOGASTRODUODENOSCOPY (EGD) WITH PROPOFOL;  Surgeon: Lesly Rubenstein, MD;  Location: [facility];  Service: Endoscopy;  Laterality: N/A;    ESOPHAGOGASTRODUODENOSCOPY (EGD) WITH PROPOFOL N/A 06/06/2021   Procedure: ESOPHAGOGASTRODUODENOSCOPY (EGD) WITH PROPOFOL;  Surgeon: Lesly Rubenstein, MD;  Location: [facility];  Service: Endoscopy;  Laterality: N/A;   EXPLORATORY LAPAROTOMY     FOOT SURGERY     KYPHOPLASTY N/A 12/10/2014   Procedure: KYPHOPLASTY L 1;  Surgeon: Hessie Knows, MD;  Location: [facility];  Service: Orthopedics;  Laterality: N/A;   OOPHORECTOMY     TONSILLECTOMY      Prior to Admission medications   Medication Sig Start Date End Date Taking? Authorizing Provider  acetaminophen-codeine (TYLENOL #3) 300-30 MG tablet Take 1-2 tablets by mouth every 6 (six) hours as needed for moderate pain. 11/01/21  Yes Bo Merino, FNP  metoprolol tartrate (LOPRESSOR) 25 MG tablet Take 0.5 tablets (12.5 mg total) by mouth 2 (two) times daily. 06/15/21  Yes Bo Merino, FNP  pantoprazole (PROTONIX) 40 MG tablet Take 1 tablet by mouth 2 (two) times daily. 03/31/21  Yes [provider]  BLACK ELDERBERRY PO Take 1 capsule by mouth daily.     [provider]  Black Pepper-Turmeric 3-500 MG CAPS Take by mouth.    [provider]  co-enzyme Q-10 30 MG capsule Take 30 mg by mouth daily.    [provider]  famotidine (PEPCID) 20 MG tablet Take by mouth daily as needed.    [provider]  levocetirizine (XYZAL) 5 MG tablet Take 1 tablet (5 mg total) by mouth every evening. Patient taking differently: Take 5 mg by mouth every morning. 06/15/21   Bo Merino, FNP  Multiple Vitamin (MULTIVITAMIN) capsule Take 1 capsule by mouth daily.  [provider]  Omega-3 Fatty Acids (FISH OIL PO) Take 1 tablet by mouth daily.    [provider]  polyethylene glycol powder (GLYCOLAX/MIRALAX) 17 GM/SCOOP powder Take 17 g by mouth daily as needed. 05/10/21   [provider]  SODIUM FLUORIDE 5000 PPM 1.1 % PSTE Take by mouth 2 (two) times daily. 10/05/21   [provider]  vitamin C (ASCORBIC ACID) 500 MG tablet Take 1,000 mg by mouth daily.     [provider]    Allergies as of 06/20/2021 - Review Complete 06/15/2021  Allergen Reaction Noted   Indapamide Other (See Comments) 07/07/2014    Family History  Problem Relation Age of Onset   Heart disease Mother    Heart attack Mother    Cancer Father    Lung cancer Father    Breast cancer Neg Hx     Social History   Socioeconomic History   Marital status: Married    Spouse name: Not on file   Number of children: Not on file   Years of education: Not on file   Highest education level: Not on file  Occupational History   Not on file  Tobacco Use   Smoking status: Never    Passive exposure: Yes   Smokeless tobacco: Never   Tobacco comments:    Parents were smokers   Vaping Use   Vaping Use: Never used  Substance and Sexual Activity   Alcohol use: Not Currently   Drug use: No   Sexual activity: Not on file  Other Topics Concern   Not on file  Social History Narrative   Not on file   Social Determinants of Health   Financial Resource Strain: Not on file  Food Insecurity: Not on file  Transportation Needs: Not on file  Physical Activity: Not on file  Stress: Not on file  Social Connections: Not on file  Intimate Partner Violence: Not on file    Review of Systems: See HPI, otherwise negative ROS  Physical Exam: Pulse 78   Temp (!) 97 F (36.1 C) (Temporal)   Ht 5\' 2"  (1.575 m)   Wt 52.3 kg   SpO2 100%   BMI 21.11 kg/m  General:   Alert, cooperative in NAD Head:  Normocephalic and atraumatic. Respiratory:  Normal work of breathing. Cardiovascular:  RRR  Impression/Plan: Angela Espinoza is here for cataract surgery.  Risks, benefits, limitations, and alternatives regarding cataract surgery have been reviewed with the patient.  Questions have been answered.  All parties agreeable.   Leafy Kindle, MD  11/07/2021, 7:51 AM

## 2021-11-07 NOTE — Anesthesia Preprocedure Evaluation (Signed)
Anesthesia Evaluation  Patient identified by MRN, date of birth, ID band Patient awake    Reviewed: Allergy & Precautions, NPO status , Patient's Chart, lab work & pertinent test results  History of Anesthesia Complications Negative for: history of anesthetic complications  Airway Mallampati: III  TM Distance: <3 FB Neck ROM: full    Dental  (+) Chipped   Pulmonary asthma ,    Pulmonary exam normal        Cardiovascular hypertension, (-) anginaNormal cardiovascular exam     Neuro/Psych  Neuromuscular disease negative psych ROS   GI/Hepatic Neg liver ROS, PUD, GERD  Controlled,  Endo/Other  negative endocrine ROS  Renal/GU      Musculoskeletal   Abdominal   Peds  Hematology negative hematology ROS (+)   Anesthesia Other Findings Past Medical History: No date: Anxiety No date: Asthma     Comment:  NO INHALER-SMOKE AND PERFUME TRIGGERS ASTHMA No date: Depression No date: GERD (gastroesophageal reflux disease) No date: Hyperlipidemia No date: Hypertension No date: IBS (irritable bowel syndrome)  Past Surgical History: age 12: ABDOMINAL HYSTERECTOMY 08/03/2014: AXILLARY LYMPH NODE BIOPSY; Right     Comment:  Procedure: EXCISION RIGHT  AXILLARY LIPOMATOSIS;                Surgeon: Leonie Green, MD;  Location: [facility];                Service: General;  Laterality: Right; 10/07/2018: BREAST BIOPSY; Left     Comment:  Stereo, Coil Clip, BENIGN BREAST TISSUE WITH AREAS               CONTAINING A PREDOMINANCE OF ADIPOSE TISSUE 10/21/2019: BREAST BIOPSY; Left     Comment:  stereo, ribbon clip, path pending  10/24/2021: CATARACT EXTRACTION W/PHACO; Left     Comment:  Procedure: CATARACT EXTRACTION PHACO AND INTRAOCULAR               LENS PLACEMENT (IOC) LEFT VIVITY TORIC LENS 7.91 00:41.6;              Surgeon: Birder Robson, MD;  Location: [place];  Service: Ophthalmology;   Laterality: Left; No date: COLONOSCOPY No date: DIAGNOSTIC LAPAROSCOPY 01/26/2021: ESOPHAGOGASTRODUODENOSCOPY (EGD) WITH PROPOFOL; N/A     Comment:  Procedure: ESOPHAGOGASTRODUODENOSCOPY (EGD) WITH               PROPOFOL;  Surgeon: Lesly Rubenstein, MD;  Location:               [facility];  Service: Endoscopy;  Laterality: N/A; 04/17/2021: ESOPHAGOGASTRODUODENOSCOPY (EGD) WITH PROPOFOL; N/A     Comment:  Procedure: ESOPHAGOGASTRODUODENOSCOPY (EGD) WITH               PROPOFOL;  Surgeon: Lesly Rubenstein, MD;  Location:               [facility];  Service: Endoscopy;  Laterality: N/A; 05/02/2021: ESOPHAGOGASTRODUODENOSCOPY (EGD) WITH PROPOFOL; N/A     Comment:  Procedure: ESOPHAGOGASTRODUODENOSCOPY (EGD) WITH               PROPOFOL;  Surgeon: Lesly Rubenstein, MD;  Location:               [facility];  Service: Endoscopy;  Laterality: N/A; 06/06/2021: ESOPHAGOGASTRODUODENOSCOPY (EGD) WITH PROPOFOL; N/A     Comment:  Procedure: ESOPHAGOGASTRODUODENOSCOPY (EGD) WITH  PROPOFOL;  Surgeon: Lesly Rubenstein, MD;  Location:               [facility];  Service: Endoscopy;  Laterality: N/A; No date: EXPLORATORY LAPAROTOMY No date: FOOT SURGERY 12/10/2014: KYPHOPLASTY; N/A     Comment:  Procedure: KYPHOPLASTY L 1;  Surgeon: Hessie Knows, MD;               Location: [facility];  Service: Orthopedics;  Laterality:               N/A; No date: OOPHORECTOMY No date: TONSILLECTOMY  BMI    Body Mass Index: 21.11 kg/m      Reproductive/Obstetrics negative OB ROS                             Anesthesia Physical Anesthesia Plan  ASA: 3  Anesthesia Plan: MAC   Post-op Pain Management:    Induction: Intravenous  PONV Risk Score and Plan:   Airway Management Planned: Natural Airway and Nasal Cannula  Additional Equipment:   Intra-op Plan:   Post-operative Plan:   Informed Consent: I have reviewed the patients History and Physical,  chart, labs and discussed the procedure including the risks, benefits and alternatives for the proposed anesthesia with the patient or authorized representative who has indicated his/her understanding and acceptance.     Dental Advisory Given  Plan Discussed with: Anesthesiologist, CRNA and Surgeon  Anesthesia Plan Comments: (Patient consented for risks of anesthesia including but not limited to:  - adverse reactions to medications - damage to eyes, teeth, lips or other oral mucosa - nerve damage due to positioning  - sore throat or hoarseness - Damage to heart, brain, nerves, lungs, other parts of body or loss of life  Patient voiced understanding.)        Anesthesia Quick Evaluation

## 2021-11-07 NOTE — Op Note (Signed)
PREOPERATIVE DIAGNOSIS:  Nuclear sclerotic cataract of the right eye.   POSTOPERATIVE DIAGNOSIS:  Nuclear sclerotic cataract of the right eye.   OPERATIVE PROCEDURE: Procedure(s): CATARACT EXTRACTION PHACO AND INTRAOCULAR LENS PLACEMENT (IOC) RIGHT VIVITY TORIC  LENS   SURGEON:  Birder Robson, MD.   ANESTHESIA: 1.      Managed anesthesia care. 2.     0.88ml of Shugarcaine was instilled following the paracentesis  Anesthesiologist: Piscitello, Precious Haws, MD CRNA: Jacqualin Combes, CRNA  COMPLICATIONS:  None.   TECHNIQUE:   Stop and chop    DESCRIPTION OF PROCEDURE:  The patient was examined and consented in the preoperative holding area where the aforementioned topical anesthesia was applied to the right eye.  The patient was brought back to the Operating Room where he was sat upright on the gurney and given a target to fixate upon while the eye was marked at the 3:00 and 9:00 position.  The patient was then reclined on the operating table.  The eye was prepped and draped in the usual sterile ophthalmic fashion and a lid speculum was placed. A paracentesis was created with the side port blade and the anterior chamber was filled with viscoelastic. A near clear corneal incision was performed with the steel keratome. A continuous curvilinear capsulorrhexis was performed with a cystotome followed by the capsulorrhexis forceps. Hydrodissection and hydrodelineation were carried out with BSS on a blunt cannula. The lens was removed in a stop and chop technique and the remaining cortical material was removed with the irrigation-aspiration handpiece. The eye was inflated with viscoelastic and the ZCT  lens  was placed in the eye and rotated to within a few degrees of the predetermined orientation.  The remaining viscoelastic was removed from the eye.  The Sinskey hook was used to rotate the toric lens into its final resting place at 048 degrees.  0. The eye was inflated to a physiologic pressure and  found to be watertight. 0.87ml of Vigamox was placed in the anterior chamber.  The eye was dressed with Vigamox and Combigan. The patient was given protective glasses to wear throughout the day and a shield with which to sleep tonight. The patient was also given drops with which to begin a drop regimen today and will follow-up with me in one day. Implant Name Type Inv. Item Serial No. Manufacturer Lot No. LRB No. Used Action  LENS IOL ACRYSOF VIVITY 20.5 - [id number]  LENS IOL ACRYSOF VIVITY 20.5 [id number] [employer]  Right 1 Implanted   Procedure(s) with comments: CATARACT EXTRACTION PHACO AND INTRAOCULAR LENS PLACEMENT (IOC) RIGHT VIVITY TORIC  LENS (Right) - 6.89 0:43.9  Electronically signed: Birder Robson 11/07/2021 8:21 AM

## 2021-11-08 ENCOUNTER — Encounter: Payer: Self-pay | Admitting: Ophthalmology

## 2021-11-22 DIAGNOSIS — G8929 Other chronic pain: Secondary | ICD-10-CM | POA: Diagnosis not present

## 2021-11-22 DIAGNOSIS — Z87898 Personal history of other specified conditions: Secondary | ICD-10-CM | POA: Diagnosis not present

## 2021-11-22 DIAGNOSIS — K311 Adult hypertrophic pyloric stenosis: Secondary | ICD-10-CM | POA: Diagnosis not present

## 2021-11-22 DIAGNOSIS — R5383 Other fatigue: Secondary | ICD-10-CM | POA: Diagnosis not present

## 2021-11-23 ENCOUNTER — Telehealth: Payer: Self-pay | Admitting: Nurse Practitioner

## 2021-11-23 ENCOUNTER — Ambulatory Visit
Admission: RE | Admit: 2021-11-23 | Discharge: 2021-11-23 | Disposition: A | Discharge status: Home / Self Care | Payer: Medicare HMO | Source: Ambulatory Visit | Attending: Pain Medicine | Admitting: Pain Medicine

## 2021-11-23 ENCOUNTER — Encounter: Payer: Self-pay | Admitting: Pain Medicine

## 2021-11-23 ENCOUNTER — Other Ambulatory Visit: Payer: Self-pay | Admitting: Nurse Practitioner

## 2021-11-23 ENCOUNTER — Ambulatory Visit: Payer: Medicare HMO | Attending: Pain Medicine | Admitting: Pain Medicine

## 2021-11-23 VITALS — BP 138/78 | HR 95 | Temp 98.8°F | Resp 16 | Ht 62.0 in | Wt 118.0 lb

## 2021-11-23 DIAGNOSIS — M7918 Myalgia, other site: Secondary | ICD-10-CM | POA: Insufficient documentation

## 2021-11-23 DIAGNOSIS — M545 Low back pain, unspecified: Secondary | ICD-10-CM

## 2021-11-23 DIAGNOSIS — M7072 Other bursitis of hip, left hip: Secondary | ICD-10-CM | POA: Insufficient documentation

## 2021-11-23 MED ORDER — LACTATED RINGERS IV SOLN: Freq: Once | INTRAVENOUS | Status: AC

## 2021-11-23 MED ORDER — MIDAZOLAM HCL 5 MG/5ML IJ SOLN
0.5000 mg | Freq: Once | INTRAMUSCULAR | Status: AC
  Administered 2021-11-23: 1 mg via INTRAVENOUS

## 2021-11-23 MED ORDER — MIDAZOLAM HCL 2 MG/2ML IJ SOLN
INTRAMUSCULAR | Status: AC
  Filled 2021-11-23: qty 2

## 2021-11-23 MED ORDER — ROPIVACAINE HCL 2 MG/ML IJ SOLN
4.0000 mL | Freq: Once | INTRAMUSCULAR | Status: AC
  Administered 2021-11-23: 4 mL via INTRA_ARTICULAR
  Filled 2021-11-23: qty 20

## 2021-11-23 MED ORDER — LIDOCAINE HCL 2 % IJ SOLN
20.0000 mL | Freq: Once | INTRAMUSCULAR | Status: AC
  Administered 2021-11-23: 400 mg
  Filled 2021-11-23: qty 20

## 2021-11-23 MED ORDER — PENTAFLUOROPROP-TETRAFLUOROETH EX AERO
INHALATION_SPRAY | Freq: Once | CUTANEOUS | Status: AC
  Administered 2021-11-23: 30 via TOPICAL

## 2021-11-23 MED ORDER — DULOXETINE HCL 30 MG PO CPEP: 30.0000 mg | ORAL_CAPSULE | Freq: Every day | ORAL | 0 refills | Status: DC

## 2021-11-23 MED ORDER — METHYLPREDNISOLONE ACETATE 80 MG/ML IJ SUSP
80.0000 mg | Freq: Once | INTRAMUSCULAR | Status: AC
  Administered 2021-11-23: 80 mg via INTRA_ARTICULAR
  Filled 2021-11-23: qty 1

## 2021-11-23 NOTE — Patient Instructions (Signed)
____________________________________________________________________________________________  Patient Information update  To: All of our patients.  Re: Name change.  It has been made official that our current name, "[facility]"   will soon be changed to "[facility]".   The purpose of this change is to eliminate any confusion created by the concept of our practice being a "Medication Management Pain Clinic". In the past this has led to the misconception that we treat pain primarily by the use of prescription medications.  Nothing can be farther from the truth.   Understanding PAIN MANAGEMENT: To further understand what our practice does, you first have to understand that "Pain Management" is a subspecialty that requires additional training once a physician has completed their specialty training, which can be in either Anesthesia, Neurology, Psychiatry, or Physical Medicine and Rehabilitation (PMR). Each one of these contributes to the final approach taken by each physician to the management of their patient's pain. To be a "Pain Management Specialist" you must have first completed one of the specialty trainings below.  Anesthesiologists - trained in clinical pharmacology and interventional techniques such as nerve blockade and regional as well as central neuroanatomy. They are trained to block pain before, during, and after surgical interventions.  Neurologists - trained in the diagnosis and pharmacological treatment of complex neurological conditions, such as Multiple Sclerosis, Parkinson's, spinal cord injuries, and other systemic conditions that may be associated with symptoms that may include but are not limited to pain. They tend to rely primarily on the treatment of chronic pain using prescription medications.  Psychiatrist - trained in conditions affecting the psychosocial wellbeing  of patients including but not limited to depression, anxiety, schizophrenia, personality disorders, addiction, and other substance use disorders that may be associated with chronic pain. They tend to rely primarily on the treatment of chronic pain using prescription medications.   Physical Medicine and Rehabilitation (PMR) physicians, also known as physiatrists - trained to treat a wide variety of medical conditions affecting the brain, spinal cord, nerves, bones, joints, ligaments, muscles, and tendons. Their training is primarily aimed at treating patients that have suffered injuries that have caused severe physical impairment. Their training is primarily aimed at the physical therapy and rehabilitation of those patients. They may also work alongside orthopedic surgeons or neurosurgeons using their expertise in assisting surgical patients to recover after their surgeries.  INTERVENTIONAL PAIN MANAGEMENT is sub-subspecialty of Pain Management.  Our physicians are Board-certified in Anesthesia, Pain Management, and Interventional Pain Management.  This meaning that not only have they been trained and Board-certified in their specialty of Anesthesia, and subspecialty of Pain Management, but they have also received further training in the sub-subspecialty of Interventional Pain Management, in order to become Board-certified as INTERVENTIONAL PAIN MANAGEMENT SPECIALIST.    Mission: Our goal is to use our skills in  INTERVENTIONAL PAIN MANAGEMENT as alternatives to the chronic use of prescription opioid medications for the treatment of pain. To make this more clear, we have changed our name to reflect what we do and offer. We will continue to offer medication management assessment and recommendations, but we will not be taking over any patient's medication management.  ____________________________________________________________________________________________   ____________________________________________________________________________________________  Post-Procedure Discharge Instructions  Instructions: Apply ice:  Purpose: This will minimize any swelling and discomfort after procedure.  When: Day of procedure, as soon as you get home. How: Fill a plastic sandwich bag with crushed ice. Cover it with a small towel and   apply to injection site. How long: (15 min on, 15 min off) Apply for 15 minutes then remove x 15 minutes.  Repeat sequence on day of procedure, until you go to bed. Apply heat:  Purpose: To treat any soreness and discomfort from the procedure. When: Starting the next day after the procedure. How: Apply heat to procedure site starting the day following the procedure. How long: May continue to repeat daily, until discomfort goes away. Food intake: Start with clear liquids (like water) and advance to regular food, as tolerated.  Physical activities: Keep activities to a minimum for the first 8 hours after the procedure. After that, then as tolerated. Driving: If you have received any sedation, be responsible and do not drive. You are not allowed to drive for 24 hours after having sedation. Blood thinner: (Applies only to those taking blood thinners) You may restart your blood thinner 6 hours after your procedure. Insulin: (Applies only to Diabetic patients taking insulin) As soon as you can eat, you may resume your normal dosing schedule. Infection prevention: Keep procedure site clean and dry. Shower daily and clean area with soap and water. Post-procedure Pain Diary: Extremely important that this be done correctly and accurately. Recorded information will be used to determine the next step in treatment. For the purpose of accuracy, follow these rules: Evaluate only the area treated. Do not report or include pain from an untreated area. For the purpose of this evaluation, ignore all other areas of pain, except for the treated area. After  your procedure, avoid taking a long nap and attempting to complete the pain diary after you wake up. Instead, set your alarm clock to go off every hour, on the hour, for the initial 8 hours after the procedure. Document the duration of the numbing medicine, and the relief you are getting from it. Do not go to sleep and attempt to complete it later. It will not be accurate. If you received sedation, it is likely that you were given a medication that may cause amnesia. Because of this, completing the diary at a later time may cause the information to be inaccurate. This information is needed to plan your care. Follow-up appointment: Keep your post-procedure follow-up evaluation appointment after the procedure (usually 2 weeks for most procedures, 6 weeks for radiofrequencies). DO NOT FORGET to bring you pain diary with you.   Expect: (What should I expect to see with my procedure?) From numbing medicine (AKA: Local Anesthetics): Numbness or decrease in pain. You may also experience some weakness, which if present, could last for the duration of the local anesthetic. Onset: Full effect within 15 minutes of injected. Duration: It will depend on the type of local anesthetic used. On the average, 1 to 8 hours.  From steroids (Applies only if steroids were used): Decrease in swelling or inflammation. Once inflammation is improved, relief of the pain will follow. Onset of benefits: Depends on the amount of swelling present. The more swelling, the longer it will take for the benefits to be seen. In some cases, up to 10 days. Duration: Steroids will stay in the system x 2 weeks. Duration of benefits will depend on multiple posibilities including persistent irritating factors. Side-effects: If present, they may typically last 2 weeks (the duration of the steroids). Frequent: Cramps (if they occur, drink Gatorade and take over-the-counter Magnesium 450-500 mg once to twice a day); water retention with temporary  weight gain; increases in blood sugar; decreased immune system response; increased appetite. Occasional: Facial   flushing (red, warm cheeks); mood swings; menstrual changes. Uncommon: Long-term decrease or suppression of natural hormones; bone thinning. (These are more common with higher doses or more frequent use. This is why we prefer that our patients avoid having any injection therapies in other practices.)  Very Rare: Severe mood changes; psychosis; aseptic necrosis. From procedure: Some discomfort is to be expected once the numbing medicine wears off. This should be minimal if ice and heat are applied as instructed.  Call if: (When should I call?) You experience numbness and weakness that gets worse with time, as opposed to wearing off. New onset bowel or bladder incontinence. (Applies only to procedures done in the spine)  Emergency Numbers: Durning business hours (Monday - Thursday, 8:00 AM - 4:00 PM) (Friday, 9:00 AM - 12:00 Noon): [phone] After hours: [phone] NOTE: If you are having a problem and are unable connect with, or to talk to a provider, then go to your nearest urgent care or emergency department. If the problem is serious and urgent, please call 911. ____________________________________________________________________________________________   

## 2021-11-23 NOTE — Telephone Encounter (Signed)
Pt has an appt on 12/18/21

## 2021-11-23 NOTE — Telephone Encounter (Signed)
Pt is calling to ask [name] advice. Per her GI doctor, cymbalta for her back pain. Wanting to know if [name] would consider witting a script for cymbalta. Or patient has appt 12/18/21 if want to wait until appt. Pt has some cymabalta but it is expried.  CB- [phone] Preferred Pharmacy[facility] on [address]

## 2021-11-23 NOTE — Progress Notes (Signed)
PROVIDER NOTE: Interpretation of information contained herein should be left to medically-trained personnel. Specific patient instructions are provided elsewhere under "Patient Instructions" section of medical record. This document was created in part using STT-dictation technology, any transcriptional errors that may result from this process are unintentional.  Patient: Angela Espinoza Type: Established DOB: Sep 30, 1955 MRN: [id number] PCP: Berniece Salines, FNP  Service: Procedure DOS: 11/23/2021 Setting: Ambulatory Location: Ambulatory outpatient facility Delivery: Face-to-face Provider: Oswaldo Done, MD Specialty: Interventional Pain Management Specialty designation: 09 Location: Outpatient facility Ref. Prov.: Berniece Salines, FNP    Primary Reason for Visit: Interventional Pain Management Treatment. CC: Hip Pain (Left)    Procedure:          Anesthesia, Analgesia, Anxiolysis:  Type: Hip bursa injection #1  Primary Purpose: Diagnostic Region: Upper (proximal) Femoral Region Level: Hip Joint Target Area: Ischiogluteal Bursa Approach: Posterolateral approach Laterality: Left  Type: Local Anesthesia Local Anesthetic: Lidocaine 1-2% Anxiolytic: IV Versed 1 mg  Indication(s):  Anxiolysis  Position: Lateral Decubitus with bad side up   1. Ischial bursitis (Left)   2. Gluteal pain (Left)    NAS-11 Pain score:   Pre-procedure: 9 /10   Post-procedure: 0-No pain/10     Pre-op H&P Assessment:  Angela Espinoza is a 66 y.o. (year old), female patient, seen today for interventional treatment. She  has a past surgical history that includes Oophorectomy; Exploratory laparotomy; Foot surgery; Colonoscopy; Tonsillectomy; Axillary lymph node biopsy (Right, 08/03/2014); Kyphoplasty (N/A, 12/10/2014); Abdominal hysterectomy (age 22); Breast biopsy (Left, 10/07/2018); Breast biopsy (Left, 10/21/2019); Esophagogastroduodenoscopy (egd) with propofol (N/A, 01/26/2021);  Esophagogastroduodenoscopy (egd) with propofol (N/A, 04/17/2021); Diagnostic laparoscopy; Esophagogastroduodenoscopy (egd) with propofol (N/A, 05/02/2021); Esophagogastroduodenoscopy (egd) with propofol (N/A, 06/06/2021); Cataract extraction w/PHACO (Left, 10/24/2021); and Cataract extraction w/PHACO (Right, 11/07/2021). Angela Espinoza has a current medication list which includes the following prescription(s): black elderberry, black pepper-turmeric, co-enzyme q-10, famotidine, levocetirizine, metoprolol tartrate, multivitamin, omega-3 fatty acids, pantoprazole, polyethylene glycol powder, sodium fluoride 5000 ppm, ascorbic acid, and acetaminophen-codeine. Her primarily concern today is the Hip Pain (Left)  Initial Vital Signs:  Pulse/HCG Rate: 80  Temp: (!) 97.3 F (36.3 C) Resp: 18 BP: 124/72 SpO2: 100 %  BMI: Estimated body mass index is 21.58 kg/m as calculated from the following:   Height as of this encounter: 5\' 2"  (1.575 m).   Weight as of this encounter: 118 lb (53.5 kg).  Risk Assessment: Allergies: Reviewed. She is allergic to mobic [meloxicam] and indapamide.  Allergy Precautions: None required Coagulopathies: Reviewed. None identified.  Blood-thinner therapy: None at this time Active Infection(s): Reviewed. None identified. Angela Espinoza is afebrile  Site Confirmation: Angela Espinoza was asked to confirm the procedure and laterality before marking the site Procedure checklist: Completed Consent: Before the procedure and under the influence of no sedative(s), amnesic(s), or anxiolytics, the patient was informed of the treatment options, risks and possible complications. To fulfill our ethical and legal obligations, as recommended by the American Medical Association's Code of Ethics, I have informed the patient of my clinical impression; the nature and purpose of the treatment or procedure; the risks, benefits, and possible complications of the intervention; the alternatives, including  doing nothing; the risk(s) and benefit(s) of the alternative treatment(s) or procedure(s); and the risk(s) and benefit(s) of doing nothing. The patient was provided information about the general risks and possible complications associated with the procedure. These may include, but are not limited to: failure to achieve desired goals, infection, bleeding, organ or nerve damage, allergic reactions, paralysis,  and death. In addition, the patient was informed of those risks and complications associated to the procedure, such as failure to decrease pain; infection; bleeding; organ or nerve damage with subsequent damage to sensory, motor, and/or autonomic systems, resulting in permanent pain, numbness, and/or weakness of one or several areas of the body; allergic reactions; (i.e.: anaphylactic reaction); and/or death. Furthermore, the patient was informed of those risks and complications associated with the medications. These include, but are not limited to: allergic reactions (i.e.: anaphylactic or anaphylactoid reaction(s)); adrenal axis suppression; blood sugar elevation that in diabetics may result in ketoacidosis or comma; water retention that in patients with history of congestive heart failure may result in shortness of breath, pulmonary edema, and decompensation with resultant heart failure; weight gain; swelling or edema; medication-induced neural toxicity; particulate matter embolism and blood vessel occlusion with resultant organ, and/or nervous system infarction; and/or aseptic necrosis of one or more joints. Finally, the patient was informed that Medicine is not an exact science; therefore, there is also the possibility of unforeseen or unpredictable risks and/or possible complications that may result in a catastrophic outcome. The patient indicated having understood very clearly. We have given the patient no guarantees and we have made no promises. Enough time was given to the patient to ask questions,  all of which were answered to the patient's satisfaction. Angela Espinoza has indicated that she wanted to continue with the procedure. Attestation: I, the ordering provider, attest that I have discussed with the patient the benefits, risks, side-effects, alternatives, likelihood of achieving goals, and potential problems during recovery for the procedure that I have provided informed consent. Date  Time: 11/23/2021  9:22 AM  Pre-Procedure Preparation:  Monitoring: As per clinic protocol. Respiration, ETCO2, SpO2, BP, heart rate and rhythm monitor placed and checked for adequate function Safety Precautions: Patient was assessed for positional comfort and pressure points before starting the procedure. Time-out: I initiated and conducted the "Time-out" before starting the procedure, as per protocol. The patient was asked to participate by confirming the accuracy of the "Time Out" information. Verification of the correct person, site, and procedure were performed and confirmed by me, the nursing staff, and the patient. "Time-out" conducted as per Joint Commission's Universal Protocol (UP.01.01.01). Time: 1053  Description of Procedure:          Area Prepped: Entire Posterolateral hip area. DuraPrep (Iodine Povacrylex [0.7% available iodine] and Isopropyl Alcohol, 74% w/w) Safety Precautions: Aspiration looking for blood return was conducted prior to all injections. At no point did we inject any substances, as a needle was being advanced. No attempts were made at seeking any paresthesias. Safe injection practices and needle disposal techniques used. Medications properly checked for expiration dates. SDV (single dose vial) medications used. Description of the Procedure: Protocol guidelines were followed. The patient was placed in position over the procedure table. The target area was identified and the area prepped in the usual manner. Skin & deeper tissues infiltrated with local anesthetic. Appropriate  amount of time allowed to pass for local anesthetics to take effect. The procedure needles were then advanced to the target area. Proper needle placement secured. Negative aspiration confirmed. Solution injected in intermittent fashion, asking for systemic symptoms every 0.5cc of injectate. The needles were then removed and the area cleansed, making sure to leave some of the prepping solution back to take advantage of its long term bactericidal properties.  Vitals:   11/23/21 1053 11/23/21 1058 11/23/21 1101 11/23/21 1110  BP: (!) 144/72 134/63 136/65 138/78  Pulse: 100 99 99 95  Resp: 18 16  16   Temp:    98.8 F (37.1 C)  TempSrc:      SpO2: 100% 100% 100% 100%  Weight:      Height:        Start Time: 1053 hrs. End Time: 1100 hrs.           Materials:  Needle(s) Type: Spinal Needle Gauge: 22G Length: 3.5-in Medication(s): Please see orders for medications and dosing details.  Imaging Guidance (Non-Spinal):          Type of Imaging Technique: Fluoroscopy Guidance (Non-Spinal) Indication(s): Assistance in needle guidance and placement for procedures requiring needle placement in or near specific anatomical locations not easily accessible without such assistance. Exposure Time: Please see nurses notes. Contrast: Before injecting any contrast, we confirmed that the patient did not have an allergy to iodine, shellfish, or radiological contrast. Once satisfactory needle placement was completed at the desired level, radiological contrast was injected. Contrast injected under live fluoroscopy. No contrast complications. See chart for type and volume of contrast used. Fluoroscopic Guidance: I was personally present during the use of fluoroscopy. "Tunnel Vision Technique" used to obtain the best possible view of the target area. Parallax error corrected before commencing the procedure. "Direction-depth-direction" technique used to introduce the needle under continuous pulsed fluoroscopy. Once  target was reached, antero-posterior, oblique, and lateral fluoroscopic projection used confirm needle placement in all planes. Images permanently stored in EMR. Interpretation: I personally interpreted the imaging intraoperatively. Adequate needle placement confirmed in multiple planes. Appropriate spread of contrast into desired area was observed. No evidence of afferent or efferent intravascular uptake. Permanent images saved into the patient's record.  Antibiotic Prophylaxis:   Anti-infectives (From admission, onward)    None      Indication(s): None identified  Post-operative Assessment:  Post-procedure Vital Signs:  Pulse/HCG Rate: 95  Temp: 98.8 F (37.1 C) Resp: 16 BP: 138/78 SpO2: 100 %  EBL: None  Complications: No immediate post-treatment complications observed by team, or reported by patient.  Note: The patient tolerated the entire procedure well. A repeat set of vitals were taken after the procedure and the patient was kept under observation following institutional policy, for this type of procedure. Post-procedural neurological assessment was performed, showing return to baseline, prior to discharge. The patient was provided with post-procedure discharge instructions, including a section on how to identify potential problems. Should any problems arise concerning this procedure, the patient was given instructions to immediately contact , at any time, without hesitation. In any case, we plan to contact the patient by telephone for a follow-up status report regarding this interventional procedure.  Comments:  No additional relevant information.  Plan of Care  Orders:  Orders Placed This Encounter  Procedures   HIP INJECTION    Scheduling Instructions:     Side: Left-sided     Sedation: Patient's choice.     Timeframe: Today   DG PAIN CLINIC C-ARM 1-60 MIN NO REPORT    Intraoperative interpretation by procedural physician at [facility].    Standing  Status:   Standing    Number of Occurrences:   1    Order Specific Question:   Reason for exam:    Answer:   Assistance in needle guidance and placement for procedures requiring needle placement in or near specific anatomical locations not easily accessible without such assistance.   Informed Consent Details: Physician/Practitioner Attestation; Transcribe to consent form and obtain patient signature  Nursing Order: Transcribe to consent form and obtain patient signature. Note: Always confirm laterality of pain with Ms. Bellucci, before procedure.    Order Specific Question:   Physician/Practitioner attestation of informed consent for procedure/surgical case    Answer:   I, the physician/practitioner, attest that I have discussed with the patient the benefits, risks, side effects, alternatives, likelihood of achieving goals and potential problems during recovery for the procedure that I have provided informed consent.    Order Specific Question:   Procedure    Answer:   Hip injection    Order Specific Question:   Physician/Practitioner performing the procedure    Answer:   Klair Leising A. Laban Emperor, MD    Order Specific Question:   Indication/Reason    Answer:   Hip Joint Pain (Arthralgia)   Provide equipment / supplies at bedside    "Block Tray" (Disposable  single use) Needle type: SpinalSpinal Amount/quantity: 1 Size: Regular (3.5-inch) Gauge: 22G    Standing Status:   Standing    Number of Occurrences:   1    Order Specific Question:   Specify    Answer:   Block Tray   Chronic Opioid Analgesic:  No opioid analgesics from our practice. MME/day: 0 mg/day   Medications ordered for procedure: Meds ordered this encounter  Medications   lidocaine (XYLOCAINE) 2 % (with pres) injection 400 mg   pentafluoroprop-tetrafluoroeth (GEBAUERS) aerosol   lactated ringers infusion   midazolam (VERSED) 5 MG/5ML injection 0.5-2 mg    Make sure Flumazenil is available in the pyxis when using this  medication. If oversedation occurs, administer 0.2 mg IV over 15 sec. If after 45 sec no response, administer 0.2 mg again over 1 min; may repeat at 1 min intervals; not to exceed 4 doses (1 mg)   methylPREDNISolone acetate (DEPO-MEDROL) injection 80 mg   ropivacaine (PF) 2 mg/mL (0.2%) (NAROPIN) injection 4 mL   Medications administered: We administered lidocaine, pentafluoroprop-tetrafluoroeth, lactated ringers, midazolam, methylPREDNISolone acetate, and ropivacaine (PF) 2 mg/mL (0.2%).  See the medical record for exact dosing, route, and time of administration.  Follow-up plan:   Return in about 2 weeks (around 12/07/2021) for Proc-day (T,Th), (F2F), (PPE).        Interventional Therapies  Risk  Complexity Considerations:   WNL   Planned  Pending:   Diagnostic/therapeutic left ischial bursa injection #1 (11/23/2021)    Under consideration:   Diagnostic left L1-2 LESI #1    Completed:   Diagnostic left thoracolumbar facet MBB (T12, L1, L2, & L3) block x2  Therapeutic left thoracolumbar (T12, L1, L2, & L3) MB RFA x2 (04/26/2020) (100/100/80/80)  Diagnostic left lumbar facet MBB x2 (03/29/2020) (100/100/60/60)  Diagnostic left lumbar facet (L4, L5, S1) MBB x1 (12/27/2020) (100/100/80/100) until 6/23. Therapeutic left lumbar (L4, L5, S1) MB RFA x1 (07/28/2019) (100/100/100/100)  Note: The patient has indicated that she has had prior lumbar facet radiofrequency ablations at [facility].    Palliative options:   Palliative left thoracolumbar (T12, L1, L2, & L3) MB RFA #2  Palliative left lumbar (L4, L5, S1) MB RFA #2      Recent Visits Date Type Provider Dept  10/31/21 Office Visit Delano Metz, MD [facility]  Showing recent visits within past 90 days and meeting all other requirements Today's Visits Date Type Provider Dept  11/23/21 Procedure visit Delano Metz, MD [facility]  Showing today's visits and meeting all other requirements Future  Appointments Date Type Provider Dept  12/19/21 Appointment Delano Metz, MD  [facility]  Showing future appointments within next 90 days and meeting all other requirements  Disposition: Discharge home  Discharge (Date  Time): 11/23/2021; 1106 hrs.   Primary Care Physician: Bo Merino, FNP Location: [facility] Note by: Gaspar Cola, MD Date: 11/23/2021; Time: 12:25 PM  Disclaimer:  Medicine is not an Chief Strategy Officer. The only guarantee in medicine is that nothing is guaranteed. It is important to note that the decision to proceed with this intervention was based on the information collected from the patient. The Data and conclusions were drawn from the patient's questionnaire, the interview, and the physical examination. Because the information was provided in large part by the patient, it cannot be guaranteed that it has not been purposely or unconsciously manipulated. Every effort has been made to obtain as much relevant data as possible for this evaluation. It is important to note that the conclusions that lead to this procedure are derived in large part from the available data. Always take into account that the treatment will also be dependent on availability of resources and existing treatment guidelines, considered by other Pain Management Practitioners as being common knowledge and practice, at the time of the intervention. For Medico-Legal purposes, it is also important to point out that variation in procedural techniques and pharmacological choices are the acceptable norm. The indications, contraindications, technique, and results of the above procedure should only be interpreted and judged by a Board-Certified Interventional Pain Specialist with extensive familiarity and expertise in the same exact procedure and technique.

## 2021-11-24 ENCOUNTER — Telehealth: Payer: Self-pay | Admitting: Pain Medicine

## 2021-11-24 ENCOUNTER — Telehealth: Payer: Self-pay

## 2021-11-24 NOTE — Telephone Encounter (Signed)
Called patient with few PP questions. States she is having some pain from the are that was worked on. Encouraged heat and to give it time for steroid to work. She also ask about starting Cybalta back and dr Beatriz Chancellor told her yesterday that it would be OK. Patient with understanding and will call back if more questions

## 2021-11-24 NOTE — Telephone Encounter (Signed)
Will call patient back.

## 2021-11-24 NOTE — Telephone Encounter (Signed)
Called PP, no answer. Left message to call if needed. 

## 2021-11-24 NOTE — Telephone Encounter (Signed)
PT stated that she was returning CG phone call, will like to see if she could get an call back. PT stated that she has some questions to ask. Please give patient a call. Thanks

## 2021-11-29 DIAGNOSIS — Z8719 Personal history of other diseases of the digestive system: Secondary | ICD-10-CM | POA: Diagnosis not present

## 2021-12-04 ENCOUNTER — Ambulatory Visit: Payer: Self-pay | Admitting: *Deleted

## 2021-12-04 ENCOUNTER — Ambulatory Visit (INDEPENDENT_AMBULATORY_CARE_PROVIDER_SITE_OTHER): Payer: Medicare HMO | Admitting: Nurse Practitioner

## 2021-12-04 ENCOUNTER — Encounter: Payer: Self-pay | Admitting: Nurse Practitioner

## 2021-12-04 VITALS — BP 148/82 | HR 75 | Temp 98.2°F | Resp 16 | Ht 62.0 in | Wt 117.2 lb

## 2021-12-04 DIAGNOSIS — R159 Full incontinence of feces: Secondary | ICD-10-CM | POA: Diagnosis not present

## 2021-12-04 LAB — HEMOCCULT GUIAC POC 1CARD (OFFICE)
Card #2 Fecal Occult Blod, POC: NEGATIVE
Fecal Occult Blood, POC: NEGATIVE

## 2021-12-04 LAB — POCT URINALYSIS DIPSTICK
Bilirubin, UA: NEGATIVE
Blood, UA: NEGATIVE
Glucose, UA: NEGATIVE
Ketones, UA: NEGATIVE
Leukocytes, UA: NEGATIVE
Nitrite, UA: NEGATIVE
Protein, UA: NEGATIVE
Spec Grav, UA: 1.02 (ref 1.010–1.025)
Urobilinogen, UA: 0.2 E.U./dL
pH, UA: 5 (ref 5.0–8.0)

## 2021-12-04 NOTE — Telephone Encounter (Signed)
Message from Loma Boston sent at 12/04/2021  8:01 AM EST  Summary: pt concerned about spotting   Pls reach out to pt, [phone] states had a hysterectomy years ago and has now on occasion spotting, however she said it was normally with bowel movement, she is worried as she also had a cyst removed. FU          Call History   Type Contact Phone/Fax User  12/04/2021 07:57 AM EST Phone (Incoming) Espinoza, Angela Blew (Self)  Loma Boston   Reason for Disposition  MILD rectal bleeding (more than just a few drops or streaks)  Answer Assessment - Initial Assessment Questions 1. APPEARANCE of BLOOD: "What color is it?" "Is it passed separately, on the surfaceof the stool, or mixed in with the stool?"      I had this happen 10 yrs ago.    2 weeks ago I'm having intermittent rectal bleeding with BMs.   I started Cymbalta.   I had a bleeding ulcer in Dec.  2. AMOUNT: "How much blood was passed?"      Brown and looks like a BM.    This happens with a BM.   Yesterday I noticed it in my underwear.    I think it's coming from my vagina but I'm not sure.    It only happens when I have a BM.   It's very intermittent.   I've had a hysterectomy years ago.    3. FREQUENCY: "How many times has blood been passed with the stools?"      It's very intermittent.   2 weeks ago it happened and again yesterday morning.   One wipe it's gone.    4. ONSET: "When was the blood first seen in the stools?" (Days or weeks)      2 weeks ago but it's very intermittent. 5. DIARRHEA: "Is there also some diarrhea?" If Yes, ask: "How many diarrhea stools in the past 24 hours?"      No 6. CONSTIPATION: "Do you have constipation?" If Yes, ask: "How bad is it?"     Constipation    I use Miralax 7. RECURRENT SYMPTOMS: "Have you had blood in your stools before?" If Yes, ask: "When was the last time?" and "What happened that time?"      Yes 8. BLOOD THINNERS: "Do you take any blood thinners?" (e.g., Coumadin/warfarin,  Pradaxa/dabigatran, aspirin)     No 9. OTHER SYMPTOMS: "Do you have any other symptoms?"  (e.g., abdomen pain, vomiting, dizziness, fever)     No 11 days ago I had a procedure.   I had an injection in my hip.   10. PREGNANCY: "Is there any chance you are pregnant?" "When was your last menstrual period?"       Not asked  Protocols used: Rectal Bleeding-A-AH

## 2021-12-04 NOTE — Progress Notes (Signed)
BP (!) 148/82   Pulse 75   Temp 98.2 F (36.8 C) (Oral)   Resp 16   Ht 5\' 2"  (1.575 m)   Wt 117 lb 3.2 oz (53.2 kg)   SpO2 99%   BMI 21.44 kg/m    Subjective:    Patient ID: , female    DOB: [birth year], 66 y.o.   MRN: [id number]  HPI: Angela Espinoza is a 66 y.o. female  Chief Complaint  Patient presents with   Vaginal Problem    Pt unsure if its bleeding or bowel movement coming through vaginal or rectum area. Pt states had 3 episodes already: 10 years ago once, 2 weeks ago once and yesterday was most recent episode. Pt states its dark and stinks denies sx.   Fecal incontinence: patient reports that she has noticed in the last two weeks that she noticed what she thinks is stool when she wipes.  She says this did happen before 10 years ago. She says that it went away on its own.  She says that she does not have any pain.  She says it does not happen every time she has a bm. She says it happened once two weeks ago and then again yesterday.  She says she is noticing brown fluid in her panties. Patient reports she was worried that she might have stool coming out of her vagina. Her last colonoscopy was 01/12/2014. She does have a GI,  Dr. 01/14/2014.  She had a hysterectomy many years ago. Urine dip was negative, pelvic exam was normal, hemoccult was negative. Patient states she has been taking miralax every night.  Discussed that her fecal incontinence is likely due to the over use of miralax. Recommend patient not take miralax every night. Recommend eating a diet high in fiber and drinking plenty of water.  Discussed she can use miralax when she is constipated but not use it every night.  Relevant past medical, surgical, family and social history reviewed and updated as indicated. Interim medical history since our last visit reviewed. Allergies and medications reviewed and updated.  Review of Systems  Constitutional: Negative for fever or weight change.   Respiratory: Negative for cough and shortness of breath.   Cardiovascular: Negative for chest pain or palpitations.  Gastrointestinal: Negative for abdominal pain, anal leakage Musculoskeletal: Negative for gait problem or joint swelling.  Skin: Negative for rash.  Neurological: Negative for dizziness or headache.  No other specific complaints in a complete review of systems (except as listed in HPI above).      Objective:    BP (!) 148/82   Pulse 75   Temp 98.2 F (36.8 C) (Oral)   Resp 16   Ht 5\' 2"  (1.575 m)   Wt 117 lb 3.2 oz (53.2 kg)   SpO2 99%   BMI 21.44 kg/m   Wt Readings from Last 3 Encounters:  12/04/21 117 lb 3.2 oz (53.2 kg)  11/23/21 118 lb (53.5 kg)  11/07/21 115 lb 6.4 oz (52.3 kg)    Physical Exam  Constitutional: Patient appears well-developed and well-nourished. No distress.  HEENT: head atraumatic, normocephalic, pupils equal and reactive to light,  neck supple Cardiovascular: Normal rate, regular rhythm and normal heart sounds.  No murmur heard. No BLE edema. Pulmonary/Chest: Effort normal and breath sounds normal. No respiratory distress. Abdominal: Soft.  There is no tenderness. Pelvic exam: normal external genitalia, vulva, vagina Rectal: no hemorrhoids noted, hemoccult negative.  Psychiatric: Patient has a normal mood  and affect. behavior is normal. Judgment and thought content normal.  Results for orders placed or performed in visit on 12/04/21  POCT urinalysis dipstick  Result Value Ref Range   Color, UA yellow    Clarity, UA clear    Glucose, UA Negative Negative   Bilirubin, UA negative    Ketones, UA negative    Spec Grav, UA 1.020 1.010 - 1.025   Blood, UA negative    pH, UA 5.0 5.0 - 8.0   Protein, UA Negative Negative   Urobilinogen, UA 0.2 0.2 or 1.0 E.U./dL   Nitrite, UA negative    Leukocytes, UA Negative Negative   Appearance clear    Odor none   POCT Occult Blood Stool  Result Value Ref Range   Fecal Occult Blood, POC  Negative Negative   Card #1 Date     Card #2 Fecal Occult Blod, POC Negative    Card #2 Date     Card #3 Fecal Occult Blood, POC     Card #3 Date        Assessment & Plan:   Problem List Items Addressed This Visit   None Visit Diagnoses     Incontinence of feces, unspecified fecal incontinence type    -  Primary   stop daily use of miralax, eat a diet high in fiber and increase fluids. Follow up with GI if no improvment.   Relevant Orders   POCT urinalysis dipstick (Completed)   POCT Occult Blood Stool (Completed)        Follow up plan: Return if symptoms worsen or fail to improve.

## 2021-12-04 NOTE — Telephone Encounter (Signed)
  Chief Complaint: Questionable bleeding vaginally or with BMs.     Symptoms: Noticed brown discharge after wiping after a BM but thinks it could be vaginal.   Has had a hysterectomy years ago. Frequency: Happened 2 weeks ago and again yesterday Pertinent Negatives: Patient denies bright red bleeding and only after having a BM and wiping. Disposition: [] ED /[] Urgent Care (no appt availability in office) / [x] Appointment(In office/virtual)/ []  [facility]/ [] Home Care/ [] Refused Recommended Disposition /[] [facility] [facility]/ []  Follow-up with PCP Additional Notes: Appt made for today with Serafina Royals, FNP at 1:00.

## 2021-12-11 ENCOUNTER — Ambulatory Visit
Admission: RE | Admit: 2021-12-11 | Discharge: 2021-12-11 | Disposition: A | Discharge status: Home / Self Care | Payer: Medicare HMO | Source: Ambulatory Visit | Attending: Nurse Practitioner | Admitting: Nurse Practitioner

## 2021-12-11 ENCOUNTER — Telehealth: Payer: Self-pay | Admitting: Pain Medicine

## 2021-12-11 ENCOUNTER — Other Ambulatory Visit: Payer: Self-pay | Admitting: Nurse Practitioner

## 2021-12-11 DIAGNOSIS — Z1231 Encounter for screening mammogram for malignant neoplasm of breast: Secondary | ICD-10-CM | POA: Diagnosis not present

## 2021-12-11 DIAGNOSIS — I10 Essential (primary) hypertension: Secondary | ICD-10-CM

## 2021-12-11 DIAGNOSIS — J301 Allergic rhinitis due to pollen: Secondary | ICD-10-CM

## 2021-12-11 NOTE — Telephone Encounter (Signed)
PT stated that she is having discomfort. PT stated that she was doing fine than she started having pain and discomfort. PT wants to know about getting another injection done.PT stated that she knows she has an appt next but wanted to let doctor Laban Emperor know. Please give patient a call. Thanks

## 2021-12-11 NOTE — Telephone Encounter (Signed)
Patient advised that a follow-up is necessary before additional injections will be ordered.

## 2021-12-12 NOTE — Telephone Encounter (Signed)
Requested Prescriptions  Pending Prescriptions Disp Refills   metoprolol tartrate (LOPRESSOR) 25 MG tablet [Pharmacy Med Name: METOPROLOL TARTRATE 25 MG TAB] 90 tablet 1    Sig: TAKE 0.5 TABLETS (12.5 MG) BY MOUTH 2 TIMES DAILY.     Cardiovascular:  Beta Blockers Failed - 12/11/2021  2:11 AM      Failed - Last BP in normal range    BP Readings from Last 1 Encounters:  12/04/21 (!) 148/82         Passed - Last Heart Rate in normal range    Pulse Readings from Last 1 Encounters:  12/04/21 75         Passed - Valid encounter within last 6 months    Recent Outpatient Visits           1 week ago Incontinence of feces, unspecified fecal incontinence type   [facility] Angela Merino, Angela Espinoza   2 months ago Chills (without fever)   [facility] Angela Merino, Angela Espinoza   4 months ago Sciatica of left side   [facility] [place], Angela Ormond, DO   4 months ago Sciatica of left side   [facility] Angela Royals [facility], Angela Espinoza   6 months ago Hypertension, unspecified type   [facility] Angela Merino, Angela Espinoza       Future Appointments             In 6 days Angela Espinoza, Angela Espinoza, Angela Espinoza, Angela Espinoza             levocetirizine (XYZAL) 5 MG tablet [Pharmacy Med Name: LEVOCETIRIZINE 5 MG TABLET] 90 tablet 1    Sig: TAKE 1 TABLET BY MOUTH EVERY DAY IN THE EVENING     Ear, Nose, and Throat:  Antihistamines - levocetirizine dihydrochloride Passed - 12/11/2021  2:11 AM      Passed - Cr in normal range and within 360 days    Creat  Date Value Ref Range Status  09/27/2021 0.81 0.50 - 1.05 mg/dL Final         Passed - eGFR is 10 or above and within 360 days    EGFR (African American)  Date Value Ref Range Status  08/12/2011 >60  Final   GFR calc Af Amer  Date Value Ref Range Status  01/01/2018 81 >59 mL/min/1.73 Final   EGFR (Non-African Amer.)  Date Value Ref Range Status   08/12/2011 >60  Final    Comment:    eGFR values <25m/min/1.73 m2 may be an indication of chronic kidney disease (CKD). Calculated eGFR is useful in patients with stable renal function. The eGFR calculation will not be reliable in acutely ill patients when serum creatinine is changing rapidly. It is not useful in  patients on dialysis. The eGFR calculation may not be applicable to patients at the low and high extremes of body sizes, pregnant women, and vegetarians.    GFR, Estimated  Date Value Ref Range Status  01/31/2021 >60 >60 mL/min Final    Comment:    (NOTE) Calculated using the CKD-EPI Creatinine Equation (2021)    eGFR  Date Value Ref Range Status  09/27/2021 80 > OR = 60 mL/min/1.724mFinal         Passed - Valid encounter within last 12 months    Recent Outpatient Visits           1 week ago Incontinence of feces, unspecified fecal incontinence type  [place], Angela Espinoza   2 months ago Chills (without fever)   [place], Angela Espinoza   4 months ago Sciatica of left side   [place], DO   4 months ago Sciatica of left side   [place], Angela Espinoza   6 months ago Hypertension, unspecified type   [facility] Angela Merino, Angela Espinoza       Future Appointments             In 6 days Angela Espinoza, Angela Espinoza, [facility], [facility]

## 2021-12-16 ENCOUNTER — Other Ambulatory Visit: Payer: Self-pay | Admitting: Nurse Practitioner

## 2021-12-16 DIAGNOSIS — G8929 Other chronic pain: Secondary | ICD-10-CM

## 2021-12-18 ENCOUNTER — Ambulatory Visit (INDEPENDENT_AMBULATORY_CARE_PROVIDER_SITE_OTHER): Payer: Medicare HMO | Admitting: Nurse Practitioner

## 2021-12-18 ENCOUNTER — Other Ambulatory Visit: Payer: Self-pay

## 2021-12-18 ENCOUNTER — Encounter: Payer: Self-pay | Admitting: Nurse Practitioner

## 2021-12-18 VITALS — BP 142/86 | HR 78 | Temp 98.1°F | Resp 18 | Ht 62.0 in | Wt 114.0 lb

## 2021-12-18 DIAGNOSIS — I10 Essential (primary) hypertension: Secondary | ICD-10-CM

## 2021-12-18 DIAGNOSIS — Z8719 Personal history of other diseases of the digestive system: Secondary | ICD-10-CM

## 2021-12-18 DIAGNOSIS — K311 Adult hypertrophic pyloric stenosis: Secondary | ICD-10-CM

## 2021-12-18 DIAGNOSIS — M5442 Lumbago with sciatica, left side: Secondary | ICD-10-CM

## 2021-12-18 DIAGNOSIS — K269 Duodenal ulcer, unspecified as acute or chronic, without hemorrhage or perforation: Secondary | ICD-10-CM | POA: Diagnosis not present

## 2021-12-18 DIAGNOSIS — J4520 Mild intermittent asthma, uncomplicated: Secondary | ICD-10-CM | POA: Diagnosis not present

## 2021-12-18 DIAGNOSIS — M545 Low back pain, unspecified: Secondary | ICD-10-CM | POA: Diagnosis not present

## 2021-12-18 DIAGNOSIS — G8929 Other chronic pain: Secondary | ICD-10-CM

## 2021-12-18 DIAGNOSIS — J301 Allergic rhinitis due to pollen: Secondary | ICD-10-CM

## 2021-12-18 DIAGNOSIS — E785 Hyperlipidemia, unspecified: Secondary | ICD-10-CM

## 2021-12-18 MED ORDER — DULOXETINE HCL 30 MG PO CPEP: 30.0000 mg | ORAL_CAPSULE | Freq: Every day | ORAL | 1 refills | Status: DC

## 2021-12-18 NOTE — Assessment & Plan Note (Signed)
Continue follow-ups with Dr. Mia Creek gastroenterology.  To avoid NSAIDs.  Continue taking Pepcid 20 mg daily

## 2021-12-18 NOTE — Assessment & Plan Note (Signed)
Continue taking xyzal dailly.

## 2021-12-18 NOTE — Telephone Encounter (Signed)
Requested medications are due for refill today.  no  Requested medications are on the active medications list.  yes  Last refill. 12/18/2021 #90 1 rf  Future visit scheduled.   no  Notes to clinic.  Pharmacy comment: REQUEST FOR 90 DAYS PRESCRIPTION. DX Code Needed.     Requested Prescriptions  Pending Prescriptions Disp Refills   DULoxetine (CYMBALTA) 30 MG capsule [Pharmacy Med Name: DULOXETINE HCL DR 30 MG CAP] 90 capsule 1    Sig: TAKE 1 CAPSULE BY MOUTH EVERY DAY     Psychiatry: Antidepressants - SNRI - duloxetine Failed - 12/16/2021 12:32 PM      Failed - Last BP in normal range    BP Readings from Last 1 Encounters:  12/18/21 (!) 142/86         Passed - Cr in normal range and within 360 days    Creat  Date Value Ref Range Status  09/27/2021 0.81 0.50 - 1.05 mg/dL Final         Passed - eGFR is 30 or above and within 360 days    EGFR (African American)  Date Value Ref Range Status  08/12/2011 >60  Final   GFR calc Af Amer  Date Value Ref Range Status  01/01/2018 81 >59 mL/min/1.73 Final   EGFR (Non-African Amer.)  Date Value Ref Range Status  08/12/2011 >60  Final    Comment:    eGFR values <53m/min/1.73 m2 may be an indication of chronic kidney disease (CKD). Calculated eGFR is useful in patients with stable renal function. The eGFR calculation will not be reliable in acutely ill patients when serum creatinine is changing rapidly. It is not useful in  patients on dialysis. The eGFR calculation may not be applicable to patients at the low and high extremes of body sizes, pregnant women, and vegetarians.    GFR, Estimated  Date Value Ref Range Status  01/31/2021 >60 >60 mL/min Final    Comment:    (NOTE) Calculated using the CKD-EPI Creatinine Equation (2021)    eGFR  Date Value Ref Range Status  09/27/2021 80 > OR = 60 mL/min/1.723mFinal         Passed - Completed PHQ-2 or PHQ-9 in the last 360 days      Passed - Valid encounter within last  6 months    Recent Outpatient Visits           Today [facility], unspecified type   [facility]   2 weeks ago Incontinence of feces, unspecified fecal incontinence type   [facility]   2 months ago Chills (without fever)   [facility]   4 months ago Sciatica of left side   [place]   4 months ago Sciatica of left side   [place] [place]

## 2021-12-18 NOTE — Assessment & Plan Note (Signed)
Continue follow-ups with Dr. Locklear gastroenterology.  To avoid NSAIDs.  Continue taking Pepcid 20 mg daily 

## 2021-12-18 NOTE — Assessment & Plan Note (Signed)
Continue taking metoprolol 12.5 mg daily.

## 2021-12-18 NOTE — Assessment & Plan Note (Signed)
Continue using albuterol when needed.

## 2021-12-18 NOTE — Assessment & Plan Note (Signed)
Continue taking Cymbalta 30 mg daily.  Continue follow-up appointments with pain management.

## 2021-12-18 NOTE — Progress Notes (Signed)
BP (!) 142/86   Pulse 78   Temp 98.1 F (36.7 C) (Oral)   Resp 18   Ht 5\' 2"  (1.575 m)   Wt 114 lb (51.7 kg)   SpO2 99%   BMI 20.85 kg/m    Subjective:    Patient ID: , female    DOB: October 19, 1955, 66 y.o.   MRN: [id number]  HPI: Angela Espinoza is a 66 y.o. female  Chief Complaint  Patient presents with   Hyperlipidemia    6 month follow up   Hypertension    Hypertension: Her blood pressure today is 142/86.  Discussed that her blood pressure is a little elevated today. She says she was a little anxious because she was getting labs today and she has not eaten anyting this morning. She currently takes metoprolol 12.5 mg twice daily.  She denies any chest pain, headaches, shortness of breath or blurred vision.  We will continue current treatment.  Asthma: Patient reports she does have an albuterol inhaler at home if she needs it.  But she has not needed it in a long time.  She says she does have reactive asthma normally its when she is around somebody has been smoking her strong colon.  Duodenal ulcer/GI bleed/pyloric stenosis: Currently under the care of Dr. 79 gastroenterology at [facility].  She was taking meloxicam for back pain status post a traumatic injury.  In December 2022 patient went to the emergency room in cardiac arrest.  She was diagnosed with a GI bleed.  On CT they found a large and deep ulceration at the duodenal bulb.  Last upper endoscopy was done on Jun 06, 2021.  Findings showed - Normal esophagus. - A medium amount of food (residue) in the stomach. - Acquired deformity in the pylorus. Dilated. - Normal examined duodenum. Patient reports she has had no issues since.  Patient avoids NSAIDs.  Patient denies any abdominal pain, or blood in stool. She says she has a call into her GI doctor for a follow up appointment.    Seasonal allergies: Patient reports she takes Xyzal for her allergies.  Patient states this works for her.   We will continue with current treatment.  Hyperlipidemia: Her last LDL was 240 on 06/15/2021. Discussed that it was recommend she start medication to help lower her cholesterol She declined and said she was going to work on lifestyle modification.  Will check cholesterol again today. Had a discussing with patient that if her cholesterol is still high that she needs to start cholesterol medication. Discussed side effects.    Chronic back pain: She is currently taking cymbalta 30 mg daily for her chronic back pain.  She says she still having pain.  She says that it is helping. She is also working with pain management.  She has an appointment with them tomorrow.     12/04/2021   12:59 PM 11/23/2021    9:31 AM 10/31/2021    1:15 PM 09/27/2021    1:55 PM 08/14/2021    2:49 PM  Depression screen PHQ 2/9  Decreased Interest 0 0 0 0 0  Down, Depressed, Hopeless 0 0 0 0 0  PHQ - 2 Score 0 0 0 0 0  Altered sleeping 0   0 0  Tired, decreased energy 0   0 0  Change in appetite 0   0 0  Feeling bad or failure about yourself  0   0 0  Trouble concentrating 0  0 0  Moving slowly or fidgety/restless 0   0 0  Suicidal thoughts 0   0 0  PHQ-9 Score 0   0 0  Difficult doing work/chores Not difficult at all   Not difficult at all Not difficult at all    Relevant past medical, surgical, family and social history reviewed and updated as indicated. Interim medical history since our last visit reviewed. Allergies and medications reviewed and updated.  Review of Systems  Constitutional: Negative for fever or weight change.  Respiratory: Negative for cough and shortness of breath.   Cardiovascular: Negative for chest pain or palpitations.  Gastrointestinal: Positive for abdominal pain, no bowel changes.  Musculoskeletal: Negative for gait problem or joint swelling.  Skin: Negative for rash.  Neurological: Negative for dizziness or headache.  No other specific complaints in a complete review of systems  (except as listed in HPI above).      Objective:    BP (!) 142/86   Pulse 78   Temp 98.1 F (36.7 C) (Oral)   Resp 18   Ht 5\' 2"  (1.575 m)   Wt 114 lb (51.7 kg)   SpO2 99%   BMI 20.85 kg/m   Wt Readings from Last 3 Encounters:  12/18/21 114 lb (51.7 kg)  12/04/21 117 lb 3.2 oz (53.2 kg)  11/23/21 118 lb (53.5 kg)    Physical Exam  Constitutional: Patient appears well-developed and well-nourished.  No distress.  HEENT: head atraumatic, normocephalic, pupils equal and reactive to light,  neck supple Cardiovascular: Normal rate, regular rhythm and normal heart sounds.  No murmur heard. No BLE edema. Pulmonary/Chest: Effort normal and breath sounds normal. No respiratory distress. Abdominal: Soft.  There is no tenderness. Psychiatric: Patient has a normal mood and affect. behavior is normal. Judgment and thought content normal.  Results for orders placed or performed in visit on 12/04/21  POCT urinalysis dipstick  Result Value Ref Range   Color, UA yellow    Clarity, UA clear    Glucose, UA Negative Negative   Bilirubin, UA negative    Ketones, UA negative    Spec Grav, UA 1.020 1.010 - 1.025   Blood, UA negative    pH, UA 5.0 5.0 - 8.0   Protein, UA Negative Negative   Urobilinogen, UA 0.2 0.2 or 1.0 E.U./dL   Nitrite, UA negative    Leukocytes, UA Negative Negative   Appearance clear    Odor none   POCT Occult Blood Stool  Result Value Ref Range   Fecal Occult Blood, POC Negative Negative   Card #1 Date     Card #2 Fecal Occult Blod, POC Negative    Card #2 Date     Card #3 Fecal Occult Blood, POC     Card #3 Date        Assessment & Plan:   Problem List Items Addressed This Visit       Cardiovascular and Mediastinum   Hypertension - Primary    Continue taking metoprolol 12.5 mg daily.       Relevant Orders   CBC with Differential/Platelet   COMPLETE METABOLIC PANEL WITH GFR     Respiratory   Asthma without status asthmaticus    Continue using  albuterol when needed.      Seasonal allergic rhinitis due to pollen    Continue taking xyzal dailly.         Digestive   Duodenal ulcer    Continue follow-ups with Dr. 13/06/23 gastroenterology.  To avoid NSAIDs.  Continue taking Pepcid 20 mg daily      Pyloric stenosis    Continue follow-ups with Dr. Mia Creek gastroenterology.  To avoid NSAIDs.  Continue taking Pepcid 20 mg daily        Nervous and Auditory   Chronic low back pain with left-sided sciatica    Continue taking Cymbalta 30 mg daily.  Continue follow-up appointments with pain management.      Relevant Medications   DULoxetine (CYMBALTA) 30 MG capsule     Other   History of GI bleed    Continue follow-ups with Dr. Mia Creek gastroenterology.  To avoid NSAIDs.  Continue taking Pepcid 20 mg daily      Other Visit Diagnoses     Hyperlipidemia, unspecified hyperlipidemia type       Relevant Orders   Lipid panel        Follow up plan: Return in about 6 months (around 06/18/2022) for follow up, needs to schedule awv.

## 2021-12-19 ENCOUNTER — Ambulatory Visit: Payer: Medicare HMO | Admitting: Pain Medicine

## 2021-12-19 ENCOUNTER — Other Ambulatory Visit: Payer: Self-pay | Admitting: Nurse Practitioner

## 2021-12-19 ENCOUNTER — Encounter: Payer: Self-pay | Admitting: Pain Medicine

## 2021-12-19 ENCOUNTER — Ambulatory Visit
Admission: RE | Admit: 2021-12-19 | Discharge: 2021-12-19 | Disposition: A | Discharge status: Home / Self Care | Payer: Medicare HMO | Source: Ambulatory Visit | Attending: Pain Medicine | Admitting: Pain Medicine

## 2021-12-19 VITALS — BP 132/82 | HR 88 | Temp 98.6°F | Ht 62.0 in | Wt 114.0 lb

## 2021-12-19 DIAGNOSIS — M25552 Pain in left hip: Secondary | ICD-10-CM | POA: Diagnosis not present

## 2021-12-19 DIAGNOSIS — E785 Hyperlipidemia, unspecified: Secondary | ICD-10-CM

## 2021-12-19 DIAGNOSIS — M545 Low back pain, unspecified: Secondary | ICD-10-CM

## 2021-12-19 DIAGNOSIS — M7072 Other bursitis of hip, left hip: Secondary | ICD-10-CM

## 2021-12-19 DIAGNOSIS — M47816 Spondylosis without myelopathy or radiculopathy, lumbar region: Secondary | ICD-10-CM | POA: Diagnosis not present

## 2021-12-19 DIAGNOSIS — M7918 Myalgia, other site: Secondary | ICD-10-CM

## 2021-12-19 DIAGNOSIS — G8929 Other chronic pain: Secondary | ICD-10-CM

## 2021-12-19 LAB — CBC WITH DIFFERENTIAL/PLATELET
Absolute Monocytes: 433 cells/uL (ref 200–950)
Basophils Absolute: 57 cells/uL (ref 0–200)
Basophils Relative: 0.8 %
Eosinophils Absolute: 142 cells/uL (ref 15–500)
Eosinophils Relative: 2 %
HCT: 41.9 % (ref 35.0–45.0)
Hemoglobin: 14.5 g/dL (ref 11.7–15.5)
Lymphs Abs: 1441 cells/uL (ref 850–3900)
MCH: 34 pg — ABNORMAL HIGH (ref 27.0–33.0)
MCHC: 34.6 g/dL (ref 32.0–36.0)
MCV: 98.4 fL (ref 80.0–100.0)
MPV: 9.5 fL (ref 7.5–12.5)
Monocytes Relative: 6.1 %
Neutro Abs: 5027 cells/uL (ref 1500–7800)
Neutrophils Relative %: 70.8 %
Platelets: 311 10*3/uL (ref 140–400)
RBC: 4.26 10*6/uL (ref 3.80–5.10)
RDW: 11.8 % (ref 11.0–15.0)
Total Lymphocyte: 20.3 %
WBC: 7.1 10*3/uL (ref 3.8–10.8)

## 2021-12-19 LAB — LIPID PANEL
Cholesterol: 251 mg/dL — ABNORMAL HIGH (ref <200)
HDL: 73 mg/dL (ref >=50)
LDL Cholesterol (Calc): 157 mg/dL (calc) — ABNORMAL HIGH
Non-HDL Cholesterol (Calc): 178 mg/dL (calc) — ABNORMAL HIGH (ref <130)
Total CHOL/HDL Ratio: 3.4 (calc) (ref <5.0)
Triglycerides: 99 mg/dL (ref <150)

## 2021-12-19 LAB — COMPLETE METABOLIC PANEL WITH GFR
AG Ratio: 1.9 (calc) (ref 1.0–2.5)
ALT: 17 U/L (ref 6–29)
AST: 22 U/L (ref 10–35)
Albumin: 4.9 g/dL (ref 3.6–5.1)
Alkaline phosphatase (APISO): 65 U/L (ref 37–153)
BUN: 12 mg/dL (ref 7–25)
CO2: 29 mmol/L (ref 20–32)
Calcium: 10.2 mg/dL (ref 8.6–10.4)
Chloride: 99 mmol/L (ref 98–110)
Creat: 0.66 mg/dL (ref 0.50–1.05)
Globulin: 2.6 g/dL (calc) (ref 1.9–3.7)
Glucose, Bld: 98 mg/dL (ref 65–99)
Potassium: 4.1 mmol/L (ref 3.5–5.3)
Sodium: 136 mmol/L (ref 135–146)
Total Bilirubin: 0.5 mg/dL (ref 0.2–1.2)
Total Protein: 7.5 g/dL (ref 6.1–8.1)
eGFR: 96 mL/min/{1.73_m2} (ref >=60)

## 2021-12-19 MED ORDER — ATORVASTATIN CALCIUM 10 MG PO TABS: 10.0000 mg | ORAL_TABLET | Freq: Every day | ORAL | 3 refills | Status: DC

## 2021-12-19 NOTE — Progress Notes (Signed)
Safety precautions to be maintained throughout the outpatient stay will include: orient to surroundings, keep bed in low position, maintain call bell within reach at all times, provide assistance with transfer out of bed and ambulation.  

## 2021-12-19 NOTE — Progress Notes (Signed)
PROVIDER NOTE: Information contained herein reflects review and annotations entered in association with encounter. Interpretation of such information and data should be left to medically-trained personnel. Information provided to patient can be located elsewhere in the medical record under "Patient Instructions". Document created using STT-dictation technology, any transcriptional errors that may result from process are unintentional.    Patient: Angela Espinoza  Service Category: E/M  Provider: Gaspar Cola, MD  DOB: 10-07-1955  DOS: 12/19/2021  Referring Provider: Bo Merino, FNP  MRN: [id number]  Specialty: Interventional Pain Management  PCP: Bo Merino, FNP  Type: Established Patient  Setting: Ambulatory outpatient    Location: Office  Delivery: Face-to-face     HPI  Angela Espinoza, a 66 y.o. year old female, is here today because of her Chronic left-sided low back pain without sciatica [M54.50, G89.29]. Angela Espinoza primary complain today is Hip Pain (left) Last encounter: My last encounter with her was on 12/11/2021. Pertinent problems: Angela Espinoza has Chronic low back pain with left-sided sciatica; Chronic pain syndrome; History of L1 kyphoplasty; Traumatic compression fracture of L1 lumbar vertebra, sequela (2016); Spondylosis without myelopathy or radiculopathy, lumbar region; Lumbar facet syndrome (Left); DDD (degenerative disc disease), lumbosacral; Thoracic facet syndrome (Left); Spondylosis without myelopathy or radiculopathy, lumbosacral region; Spondylosis without myelopathy or radiculopathy, thoracic region; DDD (degenerative disc disease), thoracolumbar; Neurogenic pain; Other intervertebral disc degeneration, thoracic region; Other intervertebral disc degeneration, lumbar region; Gluteal pain (Left); Muscle pain, myofascial (Left buttocks); Sciatica of left side; Ischial bursitis (Left); and Chronic hip pain (Left) on their pertinent problem  list. Pain Assessment: Severity of Chronic pain is reported as a 9 /10. Location: Hip Left/pain radiaties down her left leg to back of her thigh. Onset: More than a month ago. Quality: Aching, Throbbing, Nagging, Constant. Timing: Constant. Modifying factor(s): standing and laying down. Vitals:  height is _0  (1.575 m) and weight is 114 lb (51.7 kg). Her temperature is 98.6 F (37 C). Her blood pressure is 132/82 and her pulse is 88. Her oxygen saturation is 100%.   Reason for encounter: post-procedure evaluation and assessment.  The patient came into the clinic today with her husband.  She is still having problems understanding that her condition is chronic and that way will continue to recur.  Today I spent almost an hour with them talking about her condition, the procedures that were providing her, the fact that her condition will occasionally have flareups and will also go into remission, but it will not disappear.  I took a great deal of time going over the procedures and their therapeutic as well as diagnostic abilities.  I spoke to them about the role of the local anesthetic and the steroid and the diagnostic part of this procedures and the fact that her pain is multifactorial and it is not coming from just 1 area in particular.  She returns to the clinic today indicating that the left ischial bursa injection only provided her with 30% relief of the pain for the duration of the local anesthetic which at 1 point increase to a 70% and then after 6 hours it completely disappeared.  Clearly there are some issues with her interpretation and recollection of the event before she left home they have the procedure we obtained her pain score and she had indicated having attained a 100% relief of the pain immediately after the procedure.  Prior to the procedure she indicated her pain to be a 9/10 and after the procedure she  indicated that she had a 0/10 and was having no pain.  This clearly is at odds with  information that she provided [place] today indicating that for the first hour she was having 30% improvement of the pain and then on the following 4 to 6 hours she had 70% improvement.  From the pharmacological standpoint, this is simply not possible since during the initial 4 to 8 hours the effect of the steroid is not going to be felt since it would not have enough time to have eliminated the swelling which in turn causes the pain.  During those initial 4 to 8 hours the effect tests that of the local anesthetic which reaches peak effect 15 to 20 minutes after being injected and there is no way that he can be 30% improvement for the first hour and then go up to 70% improvement on the following 4 to 6 hours.  Clearly there is an issue of recollection of the information or her expectations.  Since we treated the left ischial bursa I would have expected only the pain in the left lower buttocks to have improved and therefore be the only pain that she was supposed to be keeping track of.  Today she comes in indicating that it did not work and that she still having pain.  She had several questions for me where she clearly was making some wild connections.  At 1 point she asked me if her recent stomach ulcer was responsible for the pain that she was experiencing in her left hip and lower back.  One thing that we have to remember is that she has a known history of thoracolumbar and lumbosacral degenerative disc disease with a history of an L1 vertebral body fracture treated with kyphoplasty.  She also has a history of lumbar and thoracic facet syndromes at has been treated and has improved with facet blocks and radiofrequency.  In addition, she has been informed that the radiofrequency ablations have a limited life span and in reviewing her medical records I can see that the left thoracolumbar (T12-L3) facet RFA #2 was last done on 04/26/2020.  (602 days ago).  In addition, her left Lumbar (L4-S1) facet RFA was last done on  07/28/2019.  (875 days ago).  We have to keep in mind that the patient had previously been informed that radiofrequency ablations typically can provide the patient with relief of the pain and may last anywhere from 3 months to 18 months.  18 months would be approximately 547.5 days meaning that both of her radiofrequencies have gone way past the 18 months that they may usually provide any given patient.  Today's physical exam demonstrated the patient to be able to toe walk and heel walk without any problems at all.  Lumbar flexion was also within normal limits.  Lumbar hyperextension demonstrated decreased range of motion with reproduction of the pain in the lower back.  Lumbar hyperextension on rotation maneuver and Kemp maneuver were positive for left lumbar facet arthralgia.  Provocative Patrick maneuver was completely negative for sacroiliac joint pathology and was positive on the left side for some degree of hip joint arthralgia but no decreased range of motion.  Due to the fact that the patient did experience some discomfort on the left hip with this maneuver, today I have ordered an x-ray of the left hip, which we do not have any available at this point.  However, my impression is that the patient is having a recurrence of her left lumbar  facet pain and for this reason I am scheduling her to return for a repeat diagnostic left lumbar facet medial branch block under fluoroscopic guidance.  If this provides the patient with good relief of her pain, for at least the duration of the local anesthetic, this would mean that the lumbar facet radiofrequency has definitely worn off and needs to be repeated.  The last time that we did the left lumbar facet blocks was on 12/27/2020 and it provided the patient with 100% relief of the pain for the duration of the local anesthetic as well as an ongoing 100% relief that lasted for approximately 7 months.  Eventually this was followed with a radiofrequency ablation of the  lumbar facets which also provided her with 100% relief of the pain that seem to have lasted for a considerable amount of time.  Not only this, but prior to the patient having being seen and treated here, she had indicated having had lumbar facet radiofrequency ablations done at [facility] which means that she has had this problem for quite some time.  Unfortunately, despite the fact that we have informed the patient that this will not go away and that there are no surgeries to "fix this problem", she seems to not accept this and continues to expect for her condition to be "cured", which is an unrealistic expectation.  Post-procedure evaluation    Procedure:          Anesthesia, Analgesia, Anxiolysis:  Type: Hip bursa injection #1  Primary Purpose: Diagnostic Region: Upper (proximal) Femoral Region Level: Hip Joint Target Area: Ischiogluteal Bursa Approach: Posterolateral approach Laterality: Left  Type: Local Anesthesia Local Anesthetic: Lidocaine 1-2% Anxiolytic: IV Versed 1 mg  Indication(s):  Anxiolysis  Position: Lateral Decubitus with bad side up   1. Ischial bursitis (Left)   2. Gluteal pain (Left)    NAS-11 Pain score:   Pre-procedure: 9 /10   Post-procedure: 0-No pain/10      Effectiveness:  Initial hour after procedure: 30 %. Subsequent 4-6 hours post-procedure: 70 %. Analgesia past initial 6 hours: 0 %. Ongoing improvement:  Analgesic: No benefit Function: No improvement ROM: No improvement  Pharmacotherapy Assessment  Analgesic: No opioid analgesics from our practice. MME/day: 0 mg/day   Monitoring: [place] PMP: PDMP reviewed during this encounter.       Pharmacotherapy: No side-effects or adverse reactions reported. Compliance: No problems identified. Effectiveness: Clinically acceptable.  Chauncey Fischer, RN  12/19/2021  2:48 PM  Sign when Signing Visit Safety precautions to be maintained throughout the outpatient stay will include: orient to surroundings, keep bed in  low position, maintain call bell within reach at all times, provide assistance with transfer out of bed and ambulation.       No results found for: "CBDTHCR" No results found for: "D8THCCBX" No results found for: "D9THCCBX"  UDS:  Summary  Date Value Ref Range Status  12/10/2017 FINAL  Final    Comment:    ==================================================================== TOXASSURE COMP DRUG ANALYSIS,UR ==================================================================== Test                             Result       Flag       Units Drug Present and Declared for Prescription Verification   Cyclobenzaprine                PRESENT      EXPECTED   Amitriptyline  PRESENT      EXPECTED   Metoprolol                     PRESENT      EXPECTED Drug Absent but Declared for Prescription Verification   Desmethylcyclobenzaprine       Not Detected UNEXPECTED   Duloxetine                     Not Detected UNEXPECTED ==================================================================== Test                      Result    Flag   Units      Ref Range   Creatinine              28               mg/dL      >=20 ==================================================================== Declared Medications:  The flagging and interpretation on this report are based on the  following declared medications.  Unexpected results may arise from  inaccuracies in the declared medications.  **Note: The testing scope of this panel includes these medications:  Amitriptyline  Cyclobenzaprine  Duloxetine  Metoprolol  **Note: The testing scope of this panel does not include following  reported medications:  Cholecalciferol  Hydrochlorothiazide  Levocetirizine  Losartan (Losartan Potassium)  Magnesium  Meloxicam  Multivitamin  Supplement  Supplement (Omega-3)  Vitamin B (Biotin)  Vitamin C ==================================================================== For clinical consultation, please call  [phone]. ====================================================================       ROS  Constitutional: Denies any fever or chills Gastrointestinal: No reported hemesis, hematochezia, vomiting, or acute GI distress Musculoskeletal: Denies any acute onset joint swelling, redness, loss of ROM, or weakness Neurological: No reported episodes of acute onset apraxia, aphasia, dysarthria, agnosia, amnesia, paralysis, loss of coordination, or loss of consciousness  Medication Review  Black Elderberry, Black Pepper-Turmeric, DULoxetine, Omega-3 Fatty Acids, Sodium Fluoride, ascorbic acid, atorvastatin, co-enzyme Q-10, famotidine, levocetirizine, metoprolol tartrate, and multivitamin  History Review  Allergy: Angela Espinoza is allergic to mobic [meloxicam] and indapamide. Drug: Angela Espinoza  reports no history of drug use. Alcohol:  reports that she does not currently use alcohol. Tobacco:  reports that she has never smoked. She has been exposed to tobacco smoke. She has never used smokeless tobacco. Social: Angela Espinoza  reports that she has never smoked. She has been exposed to tobacco smoke. She has never used smokeless tobacco. She reports that she does not currently use alcohol. She reports that she does not use drugs. Medical:  has a past medical history of Anxiety, Asthma, Depression, GERD (gastroesophageal reflux disease), Hyperlipidemia, Hypertension, and IBS (irritable bowel syndrome). Surgical: Angela Espinoza  has a past surgical history that includes Oophorectomy; Exploratory laparotomy; Foot surgery; Colonoscopy; Tonsillectomy; Axillary lymph node biopsy (Right, 08/03/2014); Kyphoplasty (N/A, 12/10/2014); Abdominal hysterectomy (age 48); Breast biopsy (Left, 10/07/2018); Breast biopsy (Left, 10/21/2019); Esophagogastroduodenoscopy (egd) with propofol (N/A, 01/26/2021); Esophagogastroduodenoscopy (egd) with propofol (N/A, 04/17/2021); Diagnostic laparoscopy; Esophagogastroduodenoscopy  (egd) with propofol (N/A, 05/02/2021); Esophagogastroduodenoscopy (egd) with propofol (N/A, 06/06/2021); Cataract extraction w/PHACO (Left, 10/24/2021); and Cataract extraction w/PHACO (Right, 11/07/2021). Family: family history includes Cancer in her father; Heart attack in her mother; Heart disease in her mother; Lung cancer in her father.  Laboratory Chemistry Profile   Renal Lab Results  Component Value Date   BUN 12 12/18/2021   CREATININE 0.66 12/18/2021   BCR SEE NOTE: 12/18/2021   GFRAA 81 01/01/2018   GFRNONAA >60 01/31/2021  Hepatic Lab Results  Component Value Date   AST 22 12/18/2021   ALT 17 12/18/2021   ALBUMIN 2.2 (L) 01/26/2021   ALKPHOS 46 01/26/2021   LIPASE 27 01/26/2021    Electrolytes Lab Results  Component Value Date   NA 136 12/18/2021   K 4.1 12/18/2021   CL 99 12/18/2021   CALCIUM 10.2 12/18/2021   MG 1.9 01/30/2021   PHOS 2.3 (L) 01/30/2021    Bone Lab Results  Component Value Date   25OHVITD1 40 01/01/2018   25OHVITD2 <1.0 01/01/2018   25OHVITD3 40 01/01/2018    Inflammation (CRP: Acute Phase) (ESR: Chronic Phase) Lab Results  Component Value Date   CRP 1 12/10/2017   ESRSEDRATE 3 12/10/2017   LATICACIDVEN 0.9 01/26/2021         Note: Above Lab results reviewed.  Recent Imaging Review  MM 3D SCREEN BREAST BILATERAL CLINICAL DATA:  Screening.  EXAM: DIGITAL SCREENING BILATERAL MAMMOGRAM WITH TOMOSYNTHESIS AND CAD  TECHNIQUE: Bilateral screening digital craniocaudal and mediolateral oblique mammograms were obtained. Bilateral screening digital breast tomosynthesis was performed. The images were evaluated with computer-aided detection.  COMPARISON:  Previous exam(s).  ACR Breast Density Category c: The breast tissue is heterogeneously dense, which may obscure small masses.  FINDINGS: There are no findings suspicious for malignancy.  IMPRESSION: No mammographic evidence of malignancy. A result letter of this screening  mammogram will be mailed directly to the patient.  RECOMMENDATION: Screening mammogram in one year. (Code:SM-B-01Y)  BI-RADS CATEGORY  1: Negative.  Electronically Signed   By: Lovey Newcomer M.D.   On: 12/13/2021 06:28 Note: Reviewed        Physical Exam  General appearance: Well nourished, well developed, and well hydrated. In no apparent acute distress Mental status: Alert, oriented x 3 (person, place, & time)       Respiratory: No evidence of acute respiratory distress Eyes: PERLA Vitals: BP 132/82   Pulse 88   Temp 98.6 F (37 C)   Ht _0  (1.575 m)   Wt 114 lb (51.7 kg)   SpO2 100%   BMI 20.85 kg/m  BMI: Estimated body mass index is 20.85 kg/m as calculated from the following:   Height as of this encounter: _1  (1.575 m).   Weight as of this encounter: 114 lb (51.7 kg). Ideal: Ideal body weight: 50.1 kg (110 lb 7.2 oz) Adjusted ideal body weight: 50.7 kg (111 lb 13.9 oz)  Assessment   Diagnosis Status  1. Chronic low back pain (1ry area of Pain) (Left) w/o sciatica   2. Lumbar facet syndrome (Left)   3. Chronic hip pain (Left)   4. Ischial bursitis (Left)   5. Gluteal pain (Left)    Controlled Controlled Controlled   Updated Problems: Problem  Chronic hip pain (Left)    Plan of Care  Problem-specific:  No problem-specific Assessment & Plan notes found for this encounter.  Angela Espinoza has a current medication list which includes the following long-term medication(s): atorvastatin, duloxetine, levocetirizine, and metoprolol tartrate.  Pharmacotherapy (Medications Ordered): No orders of the defined types were placed in this encounter.  Orders:  Orders Placed This Encounter  Procedures   LUMBAR FACET(MEDIAL BRANCH NERVE BLOCK) MBNB    Standing Status:   Future    Standing Expiration Date:   03/21/2022    Scheduling Instructions:     Procedure: Lumbar facet block (AKA.: Lumbosacral medial branch nerve block)     Side: Left-sided  Level: L3-4 & L5-S1 Facets (L2, L3, L4, L5, & S1 Medial Branch Nerves)     Sedation: Patient's choice.     Timeframe: ASAA    Order Specific Question:   Where will this procedure be performed?    Answer:   [facility]   DG HIP UNILAT W OR W/O PELVIS 2-3 VIEWS LEFT    Please describe any evidence of DJD, such as joint narrowing, asymmetry, cysts, or any anomalies in bone density, production, or erosion.    Standing Status:   Future    Number of Occurrences:   1    Standing Expiration Date:   01/18/2022    Scheduling Instructions:     Imaging must be done as soon as possible. Inform patient that order will expire within 30 days and I will not renew it.    Order Specific Question:   Reason for Exam (SYMPTOM  OR DIAGNOSIS REQUIRED)    Answer:   Left hip pain/arthralgia    Order Specific Question:   Preferred imaging location?    Answer:   [facility]    Order Specific Question:   Call Results- Best Contact Number?    Answer:   [phone] ([facility])    Order Specific Question:   Release to patient    Answer:   Immediate   Follow-up plan:   Return for [facility]): (L) L-FCT Blk #4.      Interventional Therapies  Risk  Complexity Considerations:   Estimated body mass index is 20.85 kg/m as calculated from the following:   Height as of this encounter: _0  (1.575 m).   Weight as of this encounter: 114 lb (51.7 kg). WNL   Planned  Pending:   Therapeutic left lumbar facet MBB #4    Under consideration:   Therapeutic left lumbar (L4, L5, S1) MB RFA #2    Completed:   Diagnostic/therapeutic left ischial bursa inj. x1 (11/23/2021) (9 to 0/10) (30/70/0/0)  Diagnostic left thoracolumbar facet MBB (T12, L1, L2, & L3) Blk x2 (07/24/2018) (100/100/100/90-100)  Therapeutic left thoracolumbar (T12, L1, L2, & L3) MB RFA x2 (04/26/2020) (100/100/80/80)  Therapeutic left lumbar facet MBB x3 (12/27/2020) (100/100/80/100)  Diagnostic left lumbar facet (L4, L5, S1) MBB x1  (12/27/2020) (100/100/80/100) until 6/23. Therapeutic left lumbar (L4, L5, S1) MB RFA x1 (07/28/2019) (100/100/100/100)    Completed by other providers:   Therapeutic left lumbar facet (L4, L5) MB RFA x1 (10/04/2017) by Arloa Koh, MD [facility])  Therapeutic right L4-5 and L5-S1 facets joint RFA x1 (10/17/2016) by Arloa Koh, MD [facility])  Note: According to the description of this procedure, radiofrequency was applied to the RIGHT L4 and the L5 medial branch at the junction of the superior articular process and the transverse process of the L4-5 facet joint and at the base of the right sacral ala for the L5 medial branch. Diagnostic left lumbar facet (L3, L5, SA*) MB RFA x1 (04/11/2016) by Arloa Koh, MD [facility])  Diagnostic left lumbar facet (L4, L5, SA*) MBB x2 (02/22/2016, 03/20/2016) by Arloa Koh, MD [facility])  Diagnostic bilateral L4-5, L5-S1 IA facet joint inj. x1 (12/30/2015) by Arloa Koh, MD [facility])  * (SA = Sacral Ala)    Therapeutic  Palliative (PRN) options:   Palliative left thoracolumbar (T12, L1, L2, & L3) MB RFA   Palliative left lumbar (L4, L5, S1) MB RFA      Recent Visits  Date Type Provider Dept  11/23/21 Procedure visit Milinda Pointer, MD [facility]  10/31/21 Office Visit Milinda Pointer, MD [facility]  Showing recent visits within past 90 days and meeting all other requirements Today's Visits Date Type Provider Dept  12/19/21 Office Visit Milinda Pointer, MD [facility]  Showing today's visits and meeting all other requirements Future Appointments No visits were found meeting these conditions. Showing future appointments within next 90 days and meeting all other requirements  I discussed the assessment and treatment plan with the patient. The patient was provided an opportunity to ask questions and all were answered. The patient agreed with  the plan and demonstrated an understanding of the instructions.  Patient advised to call back or seek an in-person evaluation if the symptoms or condition worsens.  Duration of encounter: 69 minutes.  Total time on encounter, as per AMA guidelines included both the face-to-face and non-face-to-face time personally spent by the physician and/or other qualified health care professional(s) on the day of the encounter (includes time in activities that require the physician or other qualified health care professional and does not include time in activities normally performed by clinical staff). Physician's time may include the following activities when performed: preparing to see the patient (eg, review of tests, pre-charting review of records) obtaining and/or reviewing separately obtained history performing a medically appropriate examination and/or evaluation counseling and educating the patient/family/caregiver ordering medications, tests, or procedures referring and communicating with other health care professionals (when not separately reported) documenting clinical information in the electronic or other health record independently interpreting results (not separately reported) and communicating results to the patient/ family/caregiver care coordination (not separately reported)  Note by: Gaspar Cola, MD Date: 12/19/2021; Time: 5:10 PM

## 2021-12-23 NOTE — Progress Notes (Signed)
PROVIDER NOTE: Interpretation of information contained herein should be left to medically-trained personnel. Specific patient instructions are provided elsewhere under "Patient Instructions" section of medical record. This document was created in part using STT-dictation technology, any transcriptional errors that may result from this process are unintentional.  Patient: Angela Espinoza Type: Established DOB: October 31, 1955 MRN: [id number] PCP: Berniece Salines, FNP  Service: Procedure DOS: 12/26/2021 Setting: Ambulatory Location: Ambulatory outpatient facility Delivery: Face-to-face Provider: Oswaldo Done, MD Specialty: Interventional Pain Management Specialty designation: 09 Location: Outpatient facility Ref. Prov.: Delano Metz, MD    Procedure:           Type: Lumbar Facet, Medial Branch Block(s) #4  Laterality: Left  Level: L2, L3, L4, L5, & S1 Medial Branch Level(s). Injecting these levels blocks the L3-4, L4-5 and L5-S1 lumbar facet joints. Imaging: Fluoroscopic guidance         Anesthesia: Local anesthesia (1-2% Lidocaine) Anxiolysis: IV Versed 1.5 mg Sedation: No Sedation                       DOS: 12/26/2021 Performed by: Oswaldo Done, MD  Primary Purpose: Diagnostic/Therapeutic Indications: Low back pain severe enough to impact quality of life or function. 1. Lumbar facet syndrome (Left)   2. Spondylosis without myelopathy or radiculopathy, lumbosacral region   3. DDD (degenerative disc disease), lumbosacral   4. Chronic low back pain (Left) w/o sciatica   5. Chronic low back pain (1ry area of Pain) (Left) w/o sciatica    NAS-11 Pain score:   Pre-procedure: 8 /10   Post-procedure: 0-No pain/10     Position / Prep / Materials:  Position: Prone  Prep solution: DuraPrep (Iodine Povacrylex [0.7% available iodine] and Isopropyl Alcohol, 74% w/w) Area Prepped: Posterolateral Lumbosacral Spine (Wide prep: From the lower border of the scapula down to  the end of the tailbone and from flank to flank.)  Materials:  Tray: Block Needle(s):  Type: Spinal  Gauge (G): 22  Length: 3.5-in Qty: 4     Pre-op H&P Assessment:  Angela Espinoza is a 66 y.o. (year old), female patient, seen today for interventional treatment. She  has a past surgical history that includes Oophorectomy; Exploratory laparotomy; Foot surgery; Colonoscopy; Tonsillectomy; Axillary lymph node biopsy (Right, 08/03/2014); Kyphoplasty (N/A, 12/10/2014); Abdominal hysterectomy (age 44); Breast biopsy (Left, 10/07/2018); Breast biopsy (Left, 10/21/2019); Esophagogastroduodenoscopy (egd) with propofol (N/A, 01/26/2021); Esophagogastroduodenoscopy (egd) with propofol (N/A, 04/17/2021); Diagnostic laparoscopy; Esophagogastroduodenoscopy (egd) with propofol (N/A, 05/02/2021); Esophagogastroduodenoscopy (egd) with propofol (N/A, 06/06/2021); Cataract extraction w/PHACO (Left, 10/24/2021); and Cataract extraction w/PHACO (Right, 11/07/2021). Angela Espinoza has a current medication list which includes the following prescription(s): atorvastatin, black elderberry, black pepper-turmeric, cholecalciferol, co-enzyme q-10, duloxetine, famotidine, levocetirizine, metoprolol tartrate, multivitamin, omega-3 fatty acids, sodium fluoride 5000 ppm, and ascorbic acid, and the following Facility-Administered Medications: lactated ringers. Her primarily concern today is the Back Pain (Left lumbar )  Initial Vital Signs:  Pulse/HCG Rate: 78ECG Heart Rate: 86 Temp: (!) 97.3 F (36.3 C) Resp: 16 BP: 138/83 SpO2: 100 %  BMI: Estimated body mass index is 20.85 kg/m as calculated from the following:   Height as of this encounter: 5\' 2"  (1.575 m).   Weight as of this encounter: 114 lb (51.7 kg).  Risk Assessment: Allergies: Reviewed. She is allergic to mobic [meloxicam] and indapamide.  Allergy Precautions: None required Coagulopathies: Reviewed. None identified.  Blood-thinner therapy: None at this  time Active Infection(s): Reviewed. None identified. Angela Espinoza is afebrile  Site Confirmation:  Angela Espinoza was asked to confirm the procedure and laterality before marking the site Procedure checklist: Completed Consent: Before the procedure and under the influence of no sedative(s), amnesic(s), or anxiolytics, the patient was informed of the treatment options, risks and possible complications. To fulfill our ethical and legal obligations, as recommended by the American Medical Association's Code of Ethics, I have informed the patient of my clinical impression; the nature and purpose of the treatment or procedure; the risks, benefits, and possible complications of the intervention; the alternatives, including doing nothing; the risk(s) and benefit(s) of the alternative treatment(s) or procedure(s); and the risk(s) and benefit(s) of doing nothing. The patient was provided information about the general risks and possible complications associated with the procedure. These may include, but are not limited to: failure to achieve desired goals, infection, bleeding, organ or nerve damage, allergic reactions, paralysis, and death. In addition, the patient was informed of those risks and complications associated to Spine-related procedures, such as failure to decrease pain; infection (i.e.: Meningitis, epidural or intraspinal abscess); bleeding (i.e.: epidural hematoma, subarachnoid hemorrhage, or any other type of intraspinal or peri-dural bleeding); organ or nerve damage (i.e.: Any type of peripheral nerve, nerve root, or spinal cord injury) with subsequent damage to sensory, motor, and/or autonomic systems, resulting in permanent pain, numbness, and/or weakness of one or several areas of the body; allergic reactions; (i.e.: anaphylactic reaction); and/or death. Furthermore, the patient was informed of those risks and complications associated with the medications. These include, but are not limited to:  allergic reactions (i.e.: anaphylactic or anaphylactoid reaction(s)); adrenal axis suppression; blood sugar elevation that in diabetics may result in ketoacidosis or comma; water retention that in patients with history of congestive heart failure may result in shortness of breath, pulmonary edema, and decompensation with resultant heart failure; weight gain; swelling or edema; medication-induced neural toxicity; particulate matter embolism and blood vessel occlusion with resultant organ, and/or nervous system infarction; and/or aseptic necrosis of one or more joints. Finally, the patient was informed that Medicine is not an exact science; therefore, there is also the possibility of unforeseen or unpredictable risks and/or possible complications that may result in a catastrophic outcome. The patient indicated having understood very clearly. We have given the patient no guarantees and we have made no promises. Enough time was given to the patient to ask questions, all of which were answered to the patient's satisfaction. Angela Espinoza has indicated that she wanted to continue with the procedure. Attestation: I, the ordering provider, attest that I have discussed with the patient the benefits, risks, side-effects, alternatives, likelihood of achieving goals, and potential problems during recovery for the procedure that I have provided informed consent. Date  Time: 12/26/2021 10:41 AM  Pre-Procedure Preparation:  Monitoring: As per clinic protocol. Respiration, ETCO2, SpO2, BP, heart rate and rhythm monitor placed and checked for adequate function Safety Precautions: Patient was assessed for positional comfort and pressure points before starting the procedure. Time-out: I initiated and conducted the "Time-out" before starting the procedure, as per protocol. The patient was asked to participate by confirming the accuracy of the "Time Out" information. Verification of the correct person, site, and procedure were  performed and confirmed by me, the nursing staff, and the patient. "Time-out" conducted as per Joint Commission's Universal Protocol (UP.01.01.01). Time: 1107  Description of Procedure:          Laterality: Left Targeted Levels:  L2, L3, L4, L5, & S1 Medial Branch Level(s)  Safety Precautions: Aspiration looking for blood return  was conducted prior to all injections. At no point did we inject any substances, as a needle was being advanced. Before injecting, the patient was told to immediately notify me if she was experiencing any new onset of "ringing in the ears, or metallic taste in the mouth". No attempts were made at seeking any paresthesias. Safe injection practices and needle disposal techniques used. Medications properly checked for expiration dates. SDV (single dose vial) medications used. After the completion of the procedure, all disposable equipment used was discarded in the proper designated medical waste containers. Local Anesthesia: Protocol guidelines were followed. The patient was positioned over the fluoroscopy table. The area was prepped in the usual manner. The time-out was completed. The target area was identified using fluoroscopy. A 12-in long, straight, sterile hemostat was used with fluoroscopic guidance to locate the targets for each level blocked. Once located, the skin was marked with an approved surgical skin marker. Once all sites were marked, the skin (epidermis, dermis, and hypodermis), as well as deeper tissues (fat, connective tissue and muscle) were infiltrated with a small amount of a short-acting local anesthetic, loaded on a 10cc syringe with a 25G, 1.5-in  Needle. An appropriate amount of time was allowed for local anesthetics to take effect before proceeding to the next step. Local Anesthetic: Lidocaine 2.0% The unused portion of the local anesthetic was discarded in the proper designated containers. Technical description of process:  L2 Medial Branch Nerve Block  (MBB): The target area for the L2 medial branch is at the junction of the postero-lateral aspect of the superior articular process and the superior, posterior, and medial edge of the transverse process of L3. Under fluoroscopic guidance, a Quincke needle was inserted until contact was made with os over the superior postero-lateral aspect of the pedicular shadow (target area). After negative aspiration for blood, 0.5 mL of the nerve block solution was injected without difficulty or complication. The needle was removed intact. L3 Medial Branch Nerve Block (MBB): The target area for the L3 medial branch is at the junction of the postero-lateral aspect of the superior articular process and the superior, posterior, and medial edge of the transverse process of L4. Under fluoroscopic guidance, a Quincke needle was inserted until contact was made with os over the superior postero-lateral aspect of the pedicular shadow (target area). After negative aspiration for blood, 0.5 mL of the nerve block solution was injected without difficulty or complication. The needle was removed intact. L4 Medial Branch Nerve Block (MBB): The target area for the L4 medial branch is at the junction of the postero-lateral aspect of the superior articular process and the superior, posterior, and medial edge of the transverse process of L5. Under fluoroscopic guidance, a Quincke needle was inserted until contact was made with os over the superior postero-lateral aspect of the pedicular shadow (target area). After negative aspiration for blood, 0.5 mL of the nerve block solution was injected without difficulty or complication. The needle was removed intact. L5 Medial Branch Nerve Block (MBB): The target area for the L5 medial branch is at the junction of the postero-lateral aspect of the superior articular process and the superior, posterior, and medial edge of the sacral ala. Under fluoroscopic guidance, a Quincke needle was inserted until  contact was made with os over the superior postero-lateral aspect of the pedicular shadow (target area). After negative aspiration for blood, 0.5 mL of the nerve block solution was injected without difficulty or complication. The needle was removed intact. S1 Medial Branch Nerve  Block (MBB): The target area for the S1 medial branch is at the posterior and inferior 6 o'clock position of the L5-S1 facet joint. Under fluoroscopic guidance, the Quincke needle inserted for the L5 MBB was redirected until contact was made with os over the inferior and postero aspect of the sacrum, at the 6 o' clock position under the L5-S1 facet joint (Target area). After negative aspiration for blood, 0.5 mL of the nerve block solution was injected without difficulty or complication. The needle was removed intact.  Once the entire procedure was completed, the treated area was cleaned, making sure to leave some of the prepping solution back to take advantage of its long term bactericidal properties.         Illustration of the posterior view of the lumbar spine and the posterior neural structures. Laminae of L2 through S1 are labeled. DPRL5, dorsal primary ramus of L5; DPRS1, dorsal primary ramus of S1; DPR3, dorsal primary ramus of L3; FJ, facet (zygapophyseal) joint L3-L4; I, inferior articular process of L4; LB1, lateral branch of dorsal primary ramus of L1; IAB, inferior articular branches from L3 medial branch (supplies L4-L5 facet joint); IBP, intermediate branch plexus; MB3, medial branch of dorsal primary ramus of L3; NR3, third lumbar nerve root; S, superior articular process of L5; SAB, superior articular branches from L4 (supplies L4-5 facet joint also); TP3, transverse process of L3.  Vitals:   12/26/21 1105 12/26/21 1110 12/26/21 1114 12/26/21 1120  BP: (!) 158/93 134/88 (!) 149/94 (!) 140/78  Pulse:    75  Resp: 18 16 18 16   Temp:    (!) 97 F (36.1 C)  TempSrc:      SpO2: 100% 100% 100% 99%  Weight:       Height:         Start Time: 1107 hrs. End Time: 1114 hrs.  Imaging Guidance (Spinal):          Type of Imaging Technique: Fluoroscopy Guidance (Spinal) Indication(s): Assistance in needle guidance and placement for procedures requiring needle placement in or near specific anatomical locations not easily accessible without such assistance. Exposure Time: Please see nurses notes. Contrast: None used. Fluoroscopic Guidance: I was personally present during the use of fluoroscopy. "Tunnel Vision Technique" used to obtain the best possible view of the target area. Parallax error corrected before commencing the procedure. "Direction-depth-direction" technique used to introduce the needle under continuous pulsed fluoroscopy. Once target was reached, antero-posterior, oblique, and lateral fluoroscopic projection used confirm needle placement in all planes. Images permanently stored in EMR. Interpretation: No contrast injected. I personally interpreted the imaging intraoperatively. Adequate needle placement confirmed in multiple planes. Permanent images saved into the patient's record.  Antibiotic Prophylaxis:   Anti-infectives (From admission, onward)    None      Indication(s): None identified  Post-operative Assessment:  Post-procedure Vital Signs:  Pulse/HCG Rate: 7581 Temp: (!) 97 F (36.1 C) Resp: 16 BP: (!) 140/78 SpO2: 99 %  EBL: None  Complications: No immediate post-treatment complications observed by team, or reported by patient.  Note: The patient tolerated the entire procedure well. A repeat set of vitals were taken after the procedure and the patient was kept under observation following institutional policy, for this type of procedure. Post-procedural neurological assessment was performed, showing return to baseline, prior to discharge. The patient was provided with post-procedure discharge instructions, including a section on how to identify potential problems. Should any  problems arise concerning this procedure, the patient was given instructions to immediately  contact us, at any time, without hesitation. In any case, we plan to contact the patient by telephone for a follow-up status report regarding this interventional procedure.  Comments:  No additional relevant information.  Plan of Care  Orders:  Orders Placed This Encounter  Procedures   LUMBAR FACET(MEDIAL BRANCH NERVE BLOCK) MBNB    Scheduling Instructions:     Procedure: Lumbar facet block (AKA.: Lumbosacral medial branch nerve block)     Side: Left-sided     Level: L3-4 & L5-S1 Facets (L2, L3, L4, L5, & S1 Medial Branch Nerves)     Sedation: Patient's choice.     Timeframe: Today    Order Specific Question:   Where will this procedure be performed?    Answer:   [facility]   DG PA[facility] C-ARM 1-60 MIN NO REPORT    Intraoperative interpretation by procedural physician at [facility].    Standing Status:   Standing    Number of Occurrences:   1    Order Specific Question:   Reason for exam:    Answer:   Assistance in needle guidance and placement for procedures requiring needle placement in or near specific anatomical locations not easily accessible without such assistance.   Informed Consent Details: Physician/Practitioner Attestation; Transcribe to consent form and obtain patient signature    Nursing Order: Transcribe to consent form and obtain patient signature. Note: Always confirm laterality of pain with Angela Espinoza, before procedure.    Order Specific Question:   Physician/Practitioner attestation of informed consent for procedure/surgical case    Answer:   I, the physician/practitioner, attest that I have discussed with the patient the benefits, risks, side effects, alternatives, likelihood of achieving goals and potential problems during recovery for the procedure that I have provided informed consent.    Order Specific Question:   Procedure    Answer:   Lumbar  Facet Block  under fluoroscopic guidance    Order Specific Question:   Physician/Practitioner performing the procedure    Answer:   Joselynne Killam A. Laban Emperor MD    Order Specific Question:   Indication/Reason    Answer:   Low Back Pain, with our without leg pain, due to Facet Joint Arthralgia (Joint Pain) Spondylosis (Arthritis of the Spine), without myelopathy or radiculopathy (Nerve Damage).   Provide equipment / supplies at bedside    "Block Tray" (Disposable  single use) Skin infiltration needle: Regular - 1.5-in, 25-G, (x1) Block Needle type: Spinal Amount/quantity: 4 Size: Regular (3.5-inch) Gauge: 22G    Standing Status:   Standing    Number of Occurrences:   1    Order Specific Question:   Specify    Answer:   Block Tray   Chronic Opioid Analgesic:  No opioid analgesics from our practice. MME/day: 0 mg/day   Medications ordered for procedure: Meds ordered this encounter  Medications   lidocaine (XYLOCAINE) 2 % (with pres) injection 400 mg   pentafluoroprop-tetrafluoroeth (GEBAUERS) aerosol   lactated ringers infusion   midazolam (VERSED) injection 0.5-2 mg    Make sure Flumazenil is available in the pyxis when using this medication. If oversedation occurs, administer 0.2 mg IV over 15 sec. If after 45 sec no response, administer 0.2 mg again over 1 min; may repeat at 1 min intervals; not to exceed 4 doses (1 mg)   ropivacaine (PF) 2 mg/mL (0.2%) (NAROPIN) injection 9 mL   triamcinolone acetonide (KENALOG-40) injection 40 mg   Medications administered: We administered lidocaine, pentafluoroprop-tetrafluoroeth, lactated ringers,  midazolam, ropivacaine (PF) 2 mg/mL (0.2%), and triamcinolone acetonide.  See the medical record for exact dosing, route, and time of administration.  Follow-up plan:   Return for Proc-day (T,Th), (F2F), (PPE).       Interventional Therapies  Risk  Complexity Considerations:   Estimated body mass index is 20.85 kg/m as calculated from the  following:   Height as of this encounter:  (1.575 m).   Weight as of this encounter: 114 lb (51.7 kg). WNL   Planned  Pending:   Therapeutic left lumbar facet MBB (L2-S1) #4 (12/26/2021)    Under consideration:   Diagnostic lumbar MRI  Diagnostic left hip MRI  Diagnostic sacral MRI  Therapeutic left lumbar (L4-S1) MB RFA #2    Completed:   Diagnostic left ischial bursa inj. x1 (11/23/2021) (9 to 0/10) (30/70/0/0)  Diagnostic left thoracolumbar facet MB (T12-L3) Blk x2 (07/24/2018) (100/100/100/90-100)  Therapeutic left lumbar facet MB (L2-S1) Blk x3 (12/27/2020) (100/100/80/100)  Diagnostic left lumbar facet MB (L4-S1) Blk x1 (12/27/2020) (100/100/80/100) until 6/23. Therapeutic left thoracolumbar MB (T12-L3) RFA x2 (04/26/2020) (100/100/80/80)  Therapeutic left lumbar MB (L4-S1) RFA x1 (07/28/2019) (100/100/100/100)    Completed by other providers:   Therapeutic left lumbar facet (L4, L5) MB RFA x1 (10/04/2017) by Windell Norfolk, MD [facility])  Therapeutic right L4-5, L5-S1 facet joint RFA x1 (10/17/2016) by Windell Norfolk, MD [facility])  Note: According to the description of this procedure, radiofrequency was applied to the RIGHT L4 and the L5 medial branch at the junction of the superior articular process and the transverse process of the L4-5 facet joint and at the base of the right sacral ala for the L5 medial branch. Diagnostic left lumbar facet (L3, L5, SA*) MB RFA x1 (04/11/2016) by Windell Norfolk, MD [facility])  Diagnostic left lumbar facet (L4, L5, SA*) MBB x2 (02/22/2016, 03/20/2016) by Windell Norfolk, MD [facility])  Diagnostic bilateral L4-5, L5-S1 IA facet joint inj. x1 (12/30/2015) by Windell Norfolk, MD [facility])  * (SA = Sacral Ala)    Therapeutic  Palliative (PRN) options:   Palliative left thoracolumbar (T12, L1, L2, & L3) MB RFA   Palliative left lumbar (L4, L5, S1) MB RFA       Recent Visits Date Type  Provider Dept  12/19/21 Office Visit Delano Metz, MD [facility]  11/23/21 Procedure visit Delano Metz, MD [facility]  10/31/21 Office Visit Delano Metz, MD [facility]  Showing recent visits within past 90 days and meeting all other requirements Today's Visits Date Type Provider Dept  12/26/21 Procedure visit Delano Metz, MD [facility]  Showing today's visits and meeting all other requirements Future Appointments Date Type Provider Dept  01/11/22 Appointment Delano Metz, MD [facility]  Showing future appointments within next 90 days and meeting all other requirements  Disposition: Discharge home  Discharge (Date  Time): 12/26/2021; 1122 hrs.   Primary Care Physician: Berniece Salines, FNP Location: [facility] Note by: Oswaldo Done, MD Date: 12/26/2021; Time: 11:34 AM  Disclaimer:  Medicine is not an Visual merchandiser. The only guarantee in medicine is that nothing is guaranteed. It is important to note that the decision to proceed with this intervention was based on the information collected from the patient. The Data and conclusions were drawn from the patient's questionnaire, the interview, and the physical examination. Because the information was provided in large part  by the patient, it cannot be guaranteed that it has not been purposely or unconsciously manipulated. Every effort has been made to obtain as much relevant data as possible for this evaluation. It is important to note that the conclusions that lead to this procedure are derived in large part from the available data. Always take into account that the treatment will also be dependent on availability of resources and existing treatment guidelines, considered by other Pain Management Practitioners as being common knowledge and practice, at the time of the intervention. For Medico-Legal purposes, it is also  important to point out that variation in procedural techniques and pharmacological choices are the acceptable norm. The indications, contraindications, technique, and results of the above procedure should only be interpreted and judged by a Board-Certified Interventional Pain Specialist with extensive familiarity and expertise in the same exact procedure and technique.

## 2021-12-26 ENCOUNTER — Ambulatory Visit: Payer: Medicare HMO | Attending: Pain Medicine | Admitting: Pain Medicine

## 2021-12-26 ENCOUNTER — Encounter: Payer: Self-pay | Admitting: Pain Medicine

## 2021-12-26 ENCOUNTER — Ambulatory Visit
Admission: RE | Admit: 2021-12-26 | Discharge: 2021-12-26 | Disposition: A | Discharge status: Home / Self Care | Payer: Medicare HMO | Source: Ambulatory Visit | Attending: Pain Medicine | Admitting: Pain Medicine

## 2021-12-26 VITALS — BP 140/78 | HR 75 | Temp 97.0°F | Resp 16 | Ht 62.0 in | Wt 114.0 lb

## 2021-12-26 DIAGNOSIS — M47817 Spondylosis without myelopathy or radiculopathy, lumbosacral region: Secondary | ICD-10-CM | POA: Insufficient documentation

## 2021-12-26 DIAGNOSIS — M5137 Other intervertebral disc degeneration, lumbosacral region: Secondary | ICD-10-CM | POA: Insufficient documentation

## 2021-12-26 DIAGNOSIS — M545 Low back pain, unspecified: Secondary | ICD-10-CM | POA: Insufficient documentation

## 2021-12-26 DIAGNOSIS — M47816 Spondylosis without myelopathy or radiculopathy, lumbar region: Secondary | ICD-10-CM | POA: Insufficient documentation

## 2021-12-26 DIAGNOSIS — G8929 Other chronic pain: Secondary | ICD-10-CM | POA: Diagnosis present

## 2021-12-26 MED ORDER — MIDAZOLAM HCL 2 MG/2ML IJ SOLN
0.5000 mg | Freq: Once | INTRAMUSCULAR | Status: AC
  Administered 2021-12-26: 1.5 mg via INTRAVENOUS
  Filled 2021-12-26: qty 2

## 2021-12-26 MED ORDER — TRIAMCINOLONE ACETONIDE 40 MG/ML IJ SUSP
40.0000 mg | Freq: Once | INTRAMUSCULAR | Status: AC
  Administered 2021-12-26: 40 mg
  Filled 2021-12-26: qty 1

## 2021-12-26 MED ORDER — ROPIVACAINE HCL 2 MG/ML IJ SOLN
9.0000 mL | Freq: Once | INTRAMUSCULAR | Status: AC
  Administered 2021-12-26: 9 mL via PERINEURAL
  Filled 2021-12-26: qty 20

## 2021-12-26 MED ORDER — PENTAFLUOROPROP-TETRAFLUOROETH EX AERO
INHALATION_SPRAY | Freq: Once | CUTANEOUS | Status: AC
  Administered 2021-12-26: 30 via TOPICAL

## 2021-12-26 MED ORDER — LACTATED RINGERS IV SOLN: Freq: Once | INTRAVENOUS | Status: AC

## 2021-12-26 MED ORDER — LIDOCAINE HCL 2 % IJ SOLN
20.0000 mL | Freq: Once | INTRAMUSCULAR | Status: AC
  Administered 2021-12-26: 400 mg
  Filled 2021-12-26: qty 40

## 2021-12-26 NOTE — Patient Instructions (Signed)

## 2021-12-26 NOTE — Progress Notes (Signed)
Safety precautions to be maintained throughout the outpatient stay will include: orient to surroundings, keep bed in low position, maintain call bell within reach at all times, provide assistance with transfer out of bed and ambulation.  

## 2021-12-27 ENCOUNTER — Telehealth: Payer: Self-pay

## 2021-12-27 NOTE — Telephone Encounter (Signed)
Post procedure follow up.  LM 

## 2022-01-01 ENCOUNTER — Ambulatory Visit: Payer: Self-pay | Admitting: *Deleted

## 2022-01-01 NOTE — Telephone Encounter (Signed)
Summary: Possible med reaction   Pt called reporting that she is having a possible med reaction to her cholesterol medication. Please advise  Best contact: [phone]        Chief Complaint: questions regarding lipitor causing side effects at night Symptoms: since taking lipitor patient noted increased urinating at hs. Talked with pharmacist and suggested patient to take at night. Patient reports she is no sleeping well and no quality of sleep due to nocturia.  Frequency: 1 week  Pertinent Negatives: Patient denies urinary pain no burning no other urinary issues.  Disposition: [] ED /[] Urgent Care (no appt availability in office) / [] Appointment(In office/virtual)/ []  [facility]/ [] Home Care/ [] Refused Recommended Disposition /[] [facility]/ [x]  Follow-up with PCP Additional Notes:   Recommended patient take lipitor at 6 pm instead of taking with night time medications. Monitoring drinking habits after 8 pm since she goes to bed at 10 pm. Please advise for advise. Patient unsure if lipitor is the cause for sx. Please advise       Reason for Disposition  [1] Caller has NON-URGENT medicine question about med that PCP prescribed AND [2] triager unable to answer question  Answer Assessment - Initial Assessment Questions 1. NAME of MEDICINE: "What medicine(s) are you calling about?"     Lipitor 10 mg  2. QUESTION: "What is your question?" (e.g., double dose of medicine, side effect)     Is the Lipitor causing reaction to cause unable to sleep and nocturia? 3. PRESCRIBER: "Who prescribed the medicine?" Reason: if prescribed by specialist, call should be referred to that group.     PCP 4. SYMPTOMS: "Do you have any symptoms?" If Yes, ask: "What symptoms are you having?"  "How bad are the symptoms (e.g., mild, moderate, severe)     Unable to sleep due to urinating at night. X 1 week  5. PREGNANCY:  "Is there any chance that you are pregnant?" "When was your last  menstrual period?"     na  Protocols used: Medication Question Call-A-AH

## 2022-01-02 NOTE — Telephone Encounter (Signed)
Appt sch'd for Thursday she think it is her medication (took it at a different time) but agreed to come in as a precaution

## 2022-01-03 NOTE — Progress Notes (Unsigned)
 There were no vitals taken for this visit.   Subjective:    Patient ID: Angela Espinoza, female    DOB: 08/05/1955, 66 y.o.   MRN: [id number]  HPI: Angela Espinoza is a 66 y.o. female  No chief complaint on file.  Nocturia: patient reports since starting atorvastatin she has had nocturia.   Relevant past medical, surgical, family and social history reviewed and updated as indicated. Interim medical history since our last visit reviewed. Allergies and medications reviewed and updated.  Review of Systems  Constitutional: Negative for fever or weight change.  Respiratory: Negative for cough and shortness of breath.   Cardiovascular: Negative for chest pain or palpitations.  Gastrointestinal: Negative for abdominal pain, no bowel changes.  Musculoskeletal: Negative for gait problem or joint swelling.  Skin: Negative for rash.  Neurological: Negative for dizziness or headache.  No other specific complaints in a complete review of systems (except as listed in HPI above).      Objective:    There were no vitals taken for this visit.  Wt Readings from Last 3 Encounters:  12/26/21 114 lb (51.7 kg)  12/19/21 114 lb (51.7 kg)  12/18/21 114 lb (51.7 kg)    Physical Exam  Constitutional: Patient appears well-developed and well-nourished. Obese *** No distress.  HEENT: head atraumatic, normocephalic, pupils equal and reactive to light, ears ***, neck supple, throat within normal limits Cardiovascular: Normal rate, regular rhythm and normal heart sounds.  No murmur heard. No BLE edema. Pulmonary/Chest: Effort normal and breath sounds normal. No respiratory distress. Abdominal: Soft.  There is no tenderness. Psychiatric: Patient has a normal mood and affect. behavior is normal. Judgment and thought content normal.  Results for orders placed or performed in visit on 12/18/21  Lipid panel  Result Value Ref Range   Cholesterol 251 (H) <200 mg/dL   HDL 73 > OR = 50 mg/dL    Triglycerides 99 <150 mg/dL   LDL Cholesterol (Calc) 157 (H) mg/dL (calc)   Total CHOL/HDL Ratio 3.4 <5.0 (calc)   Non-HDL Cholesterol (Calc) 178 (H) <130 mg/dL (calc)  CBC with Differential/Platelet  Result Value Ref Range   WBC 7.1 3.8 - 10.8 Thousand/uL   RBC 4.26 3.80 - 5.10 Million/uL   Hemoglobin 14.5 11.7 - 15.5 g/dL   HCT 41.9 35.0 - 45.0 %   MCV 98.4 80.0 - 100.0 fL   MCH 34.0 (H) 27.0 - 33.0 pg   MCHC 34.6 32.0 - 36.0 g/dL   RDW 11.8 11.0 - 15.0 %   Platelets 311 140 - 400 Thousand/uL   MPV 9.5 7.5 - 12.5 fL   Neutro Abs 5,027 1,500 - 7,800 cells/uL   Lymphs Abs 1,441 850 - 3,900 cells/uL   Absolute Monocytes 433 200 - 950 cells/uL   Eosinophils Absolute 142 15 - 500 cells/uL   Basophils Absolute 57 0 - 200 cells/uL   Neutrophils Relative % 70.8 %   Total Lymphocyte 20.3 %   Monocytes Relative 6.1 %   Eosinophils Relative 2.0 %   Basophils Relative 0.8 %  COMPLETE METABOLIC PANEL WITH GFR  Result Value Ref Range   Glucose, Bld 98 65 - 99 mg/dL   BUN 12 7 - 25 mg/dL   Creat 0.66 0.50 - 1.05 mg/dL   eGFR 96 > OR = 60 mL/min/1.73m2   BUN/Creatinine Ratio SEE NOTE: 6 - 22 (calc)   Sodium 136 135 - 146 mmol/L   Potassium 4.1 3.5 - 5.3 mmol/L   Chloride   99 98 - 110 mmol/L   CO2 29 20 - 32 mmol/L   Calcium 10.2 8.6 - 10.4 mg/dL   Total Protein 7.5 6.1 - 8.1 g/dL   Albumin 4.9 3.6 - 5.1 g/dL   Globulin 2.6 1.9 - 3.7 g/dL (calc)   AG Ratio 1.9 1.0 - 2.5 (calc)   Total Bilirubin 0.5 0.2 - 1.2 mg/dL   Alkaline phosphatase (APISO) 65 37 - 153 U/L   AST 22 10 - 35 U/L   ALT 17 6 - 29 U/L      Assessment & Plan:   Problem List Items Addressed This Visit   None    Follow up plan: No follow-ups on file.

## 2022-01-04 ENCOUNTER — Ambulatory Visit (INDEPENDENT_AMBULATORY_CARE_PROVIDER_SITE_OTHER): Payer: Medicare HMO | Admitting: Nurse Practitioner

## 2022-01-04 ENCOUNTER — Other Ambulatory Visit: Payer: Self-pay

## 2022-01-04 ENCOUNTER — Encounter: Payer: Self-pay | Admitting: Nurse Practitioner

## 2022-01-04 VITALS — BP 128/72 | HR 77 | Temp 98.4°F | Resp 16 | Ht 62.0 in | Wt 115.7 lb

## 2022-01-04 DIAGNOSIS — R350 Frequency of micturition: Secondary | ICD-10-CM | POA: Diagnosis not present

## 2022-01-04 DIAGNOSIS — R351 Nocturia: Secondary | ICD-10-CM

## 2022-01-04 LAB — POCT URINALYSIS DIPSTICK
Bilirubin, UA: NEGATIVE
Blood, UA: NEGATIVE
Glucose, UA: NEGATIVE
Ketones, UA: NEGATIVE
Leukocytes, UA: NEGATIVE
Nitrite, UA: NEGATIVE
Protein, UA: NEGATIVE
Spec Grav, UA: 1.02 (ref 1.010–1.025)
Urobilinogen, UA: 0.2 E.U./dL
pH, UA: 5 (ref 5.0–8.0)

## 2022-01-05 LAB — URINE CULTURE
MICRO NUMBER:: 14286844
Result:: NO GROWTH
SPECIMEN QUALITY:: ADEQUATE

## 2022-01-08 ENCOUNTER — Ambulatory Visit (INDEPENDENT_AMBULATORY_CARE_PROVIDER_SITE_OTHER): Payer: Medicare HMO | Admitting: Nurse Practitioner

## 2022-01-08 ENCOUNTER — Encounter: Payer: Self-pay | Admitting: Nurse Practitioner

## 2022-01-08 ENCOUNTER — Other Ambulatory Visit: Payer: Self-pay

## 2022-01-08 VITALS — BP 138/86 | HR 104 | Temp 98.6°F | Resp 16 | Ht 62.0 in | Wt 115.5 lb

## 2022-01-08 DIAGNOSIS — M79602 Pain in left arm: Secondary | ICD-10-CM | POA: Diagnosis not present

## 2022-01-08 LAB — COMPLETE METABOLIC PANEL WITH GFR
AG Ratio: 1.7 (calc) (ref 1.0–2.5)
ALT: 25 U/L (ref 6–29)
AST: 22 U/L (ref 10–35)
Albumin: 4.5 g/dL (ref 3.6–5.1)
Alkaline phosphatase (APISO): 69 U/L (ref 37–153)
BUN: 10 mg/dL (ref 7–25)
CO2: 29 mmol/L (ref 20–32)
Calcium: 9.8 mg/dL (ref 8.6–10.4)
Chloride: 97 mmol/L — ABNORMAL LOW (ref 98–110)
Creat: 0.67 mg/dL (ref 0.50–1.05)
Globulin: 2.6 g/dL (calc) (ref 1.9–3.7)
Glucose, Bld: 95 mg/dL (ref 65–99)
Potassium: 4 mmol/L (ref 3.5–5.3)
Sodium: 134 mmol/L — ABNORMAL LOW (ref 135–146)
Total Bilirubin: 0.4 mg/dL (ref 0.2–1.2)
Total Protein: 7.1 g/dL (ref 6.1–8.1)
eGFR: 96 mL/min/{1.73_m2} (ref >=60)

## 2022-01-08 LAB — CK: Total CK: 53 U/L (ref 29–143)

## 2022-01-08 NOTE — Progress Notes (Signed)
BP 138/86   Pulse (!) 104   Temp 98.6 F (37 C) (Oral)   Resp 16   Ht 5\' 2"  (1.575 m)   Wt 115 lb 8 oz (52.4 kg)   SpO2 98%   BMI 21.13 kg/m    Subjective:    Patient ID: , female    DOB: 04/15/1955, 66 y.o.   MRN: [id number]  HPI: Angela Espinoza is a 66 y.o. female  Chief Complaint  Patient presents with   Arm Pain    Pain in left arm    Nocutria/arm and leg pain:  patient was seen on 01/04/2022 for nocturia. She says she noticed it started when she started taking the atorvastatin.  Did a urine dip which was normal and urine culture was negative. Patient reports she is no longer having nocturia.   She is now also complaining of left arm pain. She says at her last visit she did noticed she had a little aching in her arm. She says the pain has gotten much worse.  She says that her arm feels heavy.  She says it feels like an aching toothache in her arm.  She says the pain is constant. She says the pain does not radiate anywhere. She says it got worse on Saturday and has been constant since then. She says that she stopped taking the atorvastatin on Saturday thinking it was the medication. She says the pain has continued. She says she did take some extra strength tylenol and it did not help.  She denies any chest pain or shortness of breath. EKG shows sinus tachycardia. Upon exam, no tenderness in neck or upper back she does have tenderness in her bicep.  Will get labs.     Relevant past medical, surgical, family and social history reviewed and updated as indicated. Interim medical history since our last visit reviewed. Allergies and medications reviewed and updated.  Review of Systems Constitutional: Negative for fever or weight change.  Respiratory: Negative for cough and shortness of breath.   Cardiovascular: Negative for chest pain or palpitations.  Gastrointestinal: Negative for abdominal pain, no bowel changes.  Musculoskeletal: Negative for gait  problem or joint swelling. Positive for left arm pain Skin: Negative for rash.  Neurological: Negative for dizziness or headache.  No other specific complaints in a complete review of systems (except as listed in HPI above).      Objective:    BP 138/86   Pulse (!) 104   Temp 98.6 F (37 C) (Oral)   Resp 16   Ht 5\' 2"  (1.575 m)   Wt 115 lb 8 oz (52.4 kg)   SpO2 98%   BMI 21.13 kg/m   Wt Readings from Last 3 Encounters:  01/08/22 115 lb 8 oz (52.4 kg)  01/04/22 115 lb 11.2 oz (52.5 kg)  12/26/21 114 lb (51.7 kg)    Physical Exam  Constitutional: Patient appears well-developed and well-nourished.  No distress.  HEENT: head atraumatic, normocephalic, pupils equal and reactive to light, neck supple Cardiovascular: tachycardic rate, regular rhythm and normal heart sounds.  No murmur heard. No BLE edema. Pulmonary/Chest: Effort normal and breath sounds normal. No respiratory distress. Abdominal: Soft.  There is no tenderness. MSK: left upper arm tenderness, no decrease ROM Psychiatric: Patient has a normal mood and affect. behavior is normal. Judgment and thought content normal.  Results for orders placed or performed in visit on 01/04/22  Urine Culture   Specimen: Urine  Result Value Ref  Range   MICRO NUMBER: [id number]    SPECIMEN QUALITY: Adequate    Sample Source URINE    STATUS: FINAL    Result: No Growth   POCT urinalysis dipstick  Result Value Ref Range   Color, UA yellow    Clarity, UA clear    Glucose, UA Negative Negative   Bilirubin, UA negative    Ketones, UA negative    Spec Grav, UA 1.020 1.010 - 1.025   Blood, UA negative    pH, UA 5.0 5.0 - 8.0   Protein, UA Negative Negative   Urobilinogen, UA 0.2 0.2 or 1.0 E.U./dL   Nitrite, UA negative    Leukocytes, UA Negative Negative   Appearance clear    Odor none       Assessment & Plan:   Problem List Items Addressed This Visit   None Visit Diagnoses     Left arm pain    -  Primary   ekg performed,  will gat labs, discussed could be due to cholesterol medicaiton.  stop atorvastatin at this time.   Relevant Orders   EKG 12-Lead   COMPLETE METABOLIC PANEL WITH GFR   CK (Creatine Kinase)        Follow up plan: Return if symptoms worsen or fail to improve.

## 2022-01-10 ENCOUNTER — Encounter: Payer: Self-pay | Admitting: Nurse Practitioner

## 2022-01-11 ENCOUNTER — Encounter: Payer: Self-pay | Admitting: Pain Medicine

## 2022-01-11 ENCOUNTER — Ambulatory Visit: Payer: Medicare HMO | Attending: Pain Medicine | Admitting: Pain Medicine

## 2022-01-11 VITALS — BP 138/79 | HR 71 | Temp 97.2°F | Resp 14 | Ht 62.0 in | Wt 115.0 lb

## 2022-01-11 DIAGNOSIS — M5136 Other intervertebral disc degeneration, lumbar region: Secondary | ICD-10-CM

## 2022-01-11 DIAGNOSIS — M545 Low back pain, unspecified: Secondary | ICD-10-CM | POA: Diagnosis not present

## 2022-01-11 DIAGNOSIS — M5137 Other intervertebral disc degeneration, lumbosacral region: Secondary | ICD-10-CM

## 2022-01-11 DIAGNOSIS — M25552 Pain in left hip: Secondary | ICD-10-CM | POA: Insufficient documentation

## 2022-01-11 DIAGNOSIS — M47816 Spondylosis without myelopathy or radiculopathy, lumbar region: Secondary | ICD-10-CM

## 2022-01-11 DIAGNOSIS — M7918 Myalgia, other site: Secondary | ICD-10-CM | POA: Diagnosis not present

## 2022-01-11 DIAGNOSIS — M5442 Lumbago with sciatica, left side: Secondary | ICD-10-CM

## 2022-01-11 DIAGNOSIS — M7072 Other bursitis of hip, left hip: Secondary | ICD-10-CM

## 2022-01-11 DIAGNOSIS — G8929 Other chronic pain: Secondary | ICD-10-CM | POA: Diagnosis not present

## 2022-01-11 DIAGNOSIS — M51369 Other intervertebral disc degeneration, lumbar region without mention of lumbar back pain or lower extremity pain: Secondary | ICD-10-CM

## 2022-01-11 DIAGNOSIS — G894 Chronic pain syndrome: Secondary | ICD-10-CM | POA: Diagnosis not present

## 2022-01-11 DIAGNOSIS — S32010S Wedge compression fracture of first lumbar vertebra, sequela: Secondary | ICD-10-CM

## 2022-01-11 DIAGNOSIS — M51379 Other intervertebral disc degeneration, lumbosacral region without mention of lumbar back pain or lower extremity pain: Secondary | ICD-10-CM

## 2022-01-11 NOTE — Progress Notes (Signed)
PROVIDER NOTE: Information contained herein reflects review and annotations entered in association with encounter. Interpretation of such information and data should be left to medically-trained personnel. Information provided to patient can be located elsewhere in the medical record under "Patient Instructions". Document created using STT-dictation technology, any transcriptional errors that may result from process are unintentional.    Patient: Angela Espinoza  Service Category: E/M  Provider: Gaspar Cola, MD  DOB: 1955/02/03  DOS: 01/11/2022  Referring Provider: Bo Merino, FNP  MRN: [id number]  Specialty: Interventional Pain Management  PCP: Bo Merino, FNP  Type: Established Patient  Setting: Ambulatory outpatient    Location: Office  Delivery: Face-to-face     HPI  Ms. Angela Espinoza, a 66 y.o. year old female, is here today because of her Chronic left-sided low back pain with left-sided sciatica [M54.42, G89.29]. Ms. Angela Espinoza primary complain today is Leg Pain (Back of left upper leg) Last encounter: My last encounter with her was on 12/26/2021. Pertinent problems: Ms. Angela Espinoza has Chronic pain syndrome; History of L1 kyphoplasty; Traumatic compression fracture of L1 lumbar vertebra, sequela (2016); Spondylosis without myelopathy or radiculopathy, lumbar region; Lumbar facet syndrome (Left); DDD (degenerative disc disease), lumbosacral; Thoracic facet syndrome (Left); Spondylosis without myelopathy or radiculopathy, lumbosacral region; Spondylosis without myelopathy or radiculopathy, thoracic region; DDD (degenerative disc disease), thoracolumbar; Neurogenic pain; Other intervertebral disc degeneration, thoracic region; Other intervertebral disc degeneration, lumbar region; Gluteal pain (Left); Muscle pain, myofascial (Left buttocks); Sciatica of left side; Ischial bursitis (Left); Chronic hip pain (Left); Chronic low back pain (Left) w/o sciatica; and Chronic low  back pain (Left) w/ sciatica (Left) on their pertinent problem list. Pain Assessment: Severity of Chronic pain is reported as a 7 /10. Location: Leg Left, Lower/ . Onset: More than a month ago. Quality: Aching, Throbbing. Timing: Constant. Modifying factor(s): nothing. Vitals:  height is _0  (1.575 m) and weight is 115 lb (52.2 kg). Her temporal temperature is 97.2 F (36.2 C) (abnormal). Her blood pressure is 138/79 and her pulse is 71. Her respiration is 14 and oxygen saturation is 100%.  BMI: Estimated body mass index is 21.03 kg/m as calculated from the following:   Height as of this encounter: _1  (1.575 m).   Weight as of this encounter: 115 lb (52.2 kg).  Reason for encounter: post-procedure evaluation and assessment.  Today she comes back indicating that she had 100% relief of the pain for the duration of the local anesthetic which then went away.  However further questioning reveals that she is currently having 100% relief of her low back pain which at this point she does not even remember having had that pain.  Only pain that remains is that of the leg and again after further questioning prior to the procedure she was describing this pain as a 5/10 and currently she describes it as a 2/10 indicating that she has attained more than 50% relief of the pain from the left lumbar facet block.  The biggest issue that I seem to be having here is that of the expectations.  Today I will be ordering an MRI of the lumbar spine to further evaluate what may be the cause of this pain.  Post-procedure evaluation   Type: Lumbar Facet, Medial Branch Block(s) #4  Laterality: Left  Level: L2, L3, L4, L5, & S1 Medial Branch Level(s). Injecting these levels blocks the L3-4, L4-5 and L5-S1 lumbar facet joints. Imaging: Fluoroscopic guidance  Anesthesia: Local anesthesia (1-2% Lidocaine) Anxiolysis: IV Versed 1.5 mg Sedation: No Sedation                       DOS: 12/26/2021 Performed by:  Gaspar Cola, MD  Primary Purpose: Diagnostic/Therapeutic Indications: Low back pain severe enough to impact quality of life or function. 1. Lumbar facet syndrome (Left)   2. Spondylosis without myelopathy or radiculopathy, lumbosacral region   3. DDD (degenerative disc disease), lumbosacral   4. Chronic low back pain (Left) w/o sciatica   5. Chronic low back pain (1ry area of Pain) (Left) w/o sciatica    NAS-11 Pain score:   Pre-procedure: 8 /10   Post-procedure: 0-No pain/10      Effectiveness:  Initial hour after procedure: 100 %. Subsequent 4-6 hours post-procedure: 100 %. Analgesia past initial 6 hours: 0 %. Ongoing improvement:  Analgesic: The patient appears to have at least 50% relief of her left lower extremity pain.  This pain runs from her buttocks to the back of the left leg to the level of the knee.  It does not go past the knee.  She currently is having 0 pain in her lower back.  When we did the lumbar facet block she was experiencing left-sided low back pain, and today she does not even remember having had that pain.  This tells me that she has attained an ongoing 100% relief of the left lower back pain. Function: Somewhat improved ROM: Somewhat improved  Pharmacotherapy Assessment  Analgesic: No opioid analgesics from our practice. MME/day: 0 mg/day   Monitoring: [place] PMP: PDMP reviewed during this encounter.       Pharmacotherapy: No side-effects or adverse reactions reported. Compliance: No problems identified. Effectiveness: Clinically acceptable.  No notes on file  No results found for: "CBDTHCR" No results found for: "D8THCCBX" No results found for: "D9THCCBX"  UDS:  Summary  Date Value Ref Range Status  12/10/2017 FINAL  Final    Comment:    ==================================================================== TOXASSURE COMP DRUG ANALYSIS,UR ==================================================================== Test                              Result       Flag       Units Drug Present and Declared for Prescription Verification   Cyclobenzaprine                PRESENT      EXPECTED   Amitriptyline                  PRESENT      EXPECTED   Metoprolol                     PRESENT      EXPECTED Drug Absent but Declared for Prescription Verification   Desmethylcyclobenzaprine       Not Detected UNEXPECTED   Duloxetine                     Not Detected UNEXPECTED ==================================================================== Test                      Result    Flag   Units      Ref Range   Creatinine              28               mg/dL      >=  20 ==================================================================== Declared Medications:  The flagging and interpretation on this report are based on the  following declared medications.  Unexpected results may arise from  inaccuracies in the declared medications.  **Note: The testing scope of this panel includes these medications:  Amitriptyline  Cyclobenzaprine  Duloxetine  Metoprolol  **Note: The testing scope of this panel does not include following  reported medications:  Cholecalciferol  Hydrochlorothiazide  Levocetirizine  Losartan (Losartan Potassium)  Magnesium  Meloxicam  Multivitamin  Supplement  Supplement (Omega-3)  Vitamin B (Biotin)  Vitamin C ==================================================================== For clinical consultation, please call [phone]. ====================================================================       ROS  Constitutional: Denies any fever or chills Gastrointestinal: No reported hemesis, hematochezia, vomiting, or acute GI distress Musculoskeletal: Denies any acute onset joint swelling, redness, loss of ROM, or weakness Neurological: No reported episodes of acute onset apraxia, aphasia, dysarthria, agnosia, amnesia, paralysis, loss of coordination, or loss of consciousness  Medication Review  Black Elderberry, Black  Pepper-Turmeric, DULoxetine, Omega-3 Fatty Acids, Sodium Fluoride, ascorbic acid, cholecalciferol, co-enzyme Q-10, famotidine, levocetirizine, metoprolol tartrate, and multivitamin  History Review  Allergy: Ms. Angela Espinoza is allergic to mobic [meloxicam] and indapamide. Drug: Ms. Angela Espinoza  reports no history of drug use. Alcohol:  reports that she does not currently use alcohol. Tobacco:  reports that she has never smoked. She has been exposed to tobacco smoke. She has never used smokeless tobacco. Social: Ms. Angela Espinoza  reports that she has never smoked. She has been exposed to tobacco smoke. She has never used smokeless tobacco. She reports that she does not currently use alcohol. She reports that she does not use drugs. Medical:  has a past medical history of Anxiety, Asthma, Depression, GERD (gastroesophageal reflux disease), Hyperlipidemia, Hypertension, and IBS (irritable bowel syndrome). Surgical: Ms. Angela Espinoza  has a past surgical history that includes Oophorectomy; Exploratory laparotomy; Foot surgery; Colonoscopy; Tonsillectomy; Axillary lymph node biopsy (Right, 08/03/2014); Kyphoplasty (N/A, 12/10/2014); Abdominal hysterectomy (age 34); Breast biopsy (Left, 10/07/2018); Breast biopsy (Left, 10/21/2019); Esophagogastroduodenoscopy (egd) with propofol (N/A, 01/26/2021); Esophagogastroduodenoscopy (egd) with propofol (N/A, 04/17/2021); Diagnostic laparoscopy; Esophagogastroduodenoscopy (egd) with propofol (N/A, 05/02/2021); Esophagogastroduodenoscopy (egd) with propofol (N/A, 06/06/2021); Cataract extraction w/PHACO (Left, 10/24/2021); and Cataract extraction w/PHACO (Right, 11/07/2021). Family: family history includes Cancer in her father; Heart attack in her mother; Heart disease in her mother; Lung cancer in her father.  Laboratory Chemistry Profile   Renal Lab Results  Component Value Date   BUN 10 01/08/2022   CREATININE 0.67 01/08/2022   BCR SEE NOTE: 01/08/2022   GFRAA 81  01/01/2018   GFRNONAA >60 01/31/2021    Hepatic Lab Results  Component Value Date   AST 22 01/08/2022   ALT 25 01/08/2022   ALBUMIN 2.2 (L) 01/26/2021   ALKPHOS 46 01/26/2021   LIPASE 27 01/26/2021    Electrolytes Lab Results  Component Value Date   NA 134 (L) 01/08/2022   K 4.0 01/08/2022   CL 97 (L) 01/08/2022   CALCIUM 9.8 01/08/2022   MG 1.9 01/30/2021   PHOS 2.3 (L) 01/30/2021    Bone Lab Results  Component Value Date   25OHVITD1 40 01/01/2018   25OHVITD2 <1.0 01/01/2018   25OHVITD3 40 01/01/2018    Inflammation (CRP: Acute Phase) (ESR: Chronic Phase) Lab Results  Component Value Date   CRP 1 12/10/2017   ESRSEDRATE 3 12/10/2017   LATICACIDVEN 0.9 01/26/2021         Note: Above Lab results reviewed.  Recent Imaging Review  DG PAIN CLINIC C-ARM 1-60  MIN NO REPORT Fluoro was used, but no Radiologist interpretation will be provided.  Please refer to "NOTES" tab for provider progress note. Note: Reviewed        Physical Exam  General appearance: Well nourished, well developed, and well hydrated. In no apparent acute distress Mental status: Alert, oriented x 3 (person, place, & time)       Respiratory: No evidence of acute respiratory distress Eyes: PERLA Vitals: BP 138/79   Pulse 71   Temp (!) 97.2 F (36.2 C) (Temporal)   Resp 14   Ht _0  (1.575 m)   Wt 115 lb (52.2 kg)   SpO2 100%   BMI 21.03 kg/m  BMI: Estimated body mass index is 21.03 kg/m as calculated from the following:   Height as of this encounter: _1  (1.575 m).   Weight as of this encounter: 115 lb (52.2 kg). Ideal: Ideal body weight: 50.1 kg (110 lb 7.2 oz) Adjusted ideal body weight: 50.9 kg (112 lb 4.3 oz)  Assessment   Diagnosis Status  1. Chronic low back pain (Left) w/ sciatica (Left)   2. Lumbar facet syndrome (Left)   3. Chronic hip pain (Left)   4. Gluteal pain (Left)   5. Ischial bursitis (Left)   6. DDD (degenerative disc disease), lumbosacral   7. Other  intervertebral disc degeneration, lumbar region   8. Traumatic compression fracture of L1 lumbar vertebra, sequela (2016)   9. Chronic pain syndrome    Controlled Controlled Controlled   Updated Problems: No problems updated.   Plan of Care  Problem-specific:  No problem-specific Assessment & Plan notes found for this encounter.  Ms. Angela Espinoza has a current medication list which includes the following long-term medication(s): duloxetine, levocetirizine, and metoprolol tartrate.  Pharmacotherapy (Medications Ordered): No orders of the defined types were placed in this encounter.  Orders:  Orders Placed This Encounter  Procedures   MR LUMBAR SPINE WO CONTRAST    Patient presents with axial pain with possible radicular component. Please assist [place] in identifying specific level(s) and laterality of any additional findings such as: 1. Facet (Zygapophyseal) joint DJD (Hypertrophy, space narrowing, subchondral sclerosis, and/or osteophyte formation) 2. DDD and/or IVDD (Loss of disc height, desiccation, gas patterns, osteophytes, endplate sclerosis, or "Black disc disease") 3. Pars defects 4. Spondylolisthesis, spondylosis, and/or spondyloarthropathies (include Degree/Grade of displacement in mm) (stability) 5. Vertebral body Fractures (acute/chronic) (state percentage of collapse) 6. Demineralization (osteopenia/osteoporotic) 7. Bone pathology 8. Foraminal narrowing  9. Surgical changes 10. Central, Lateral Recess, and/or Foraminal Stenosis (include AP diameter of stenosis in mm) 11. Surgical changes (hardware type, status, and presence of fibrosis) 12. Modic Type Changes (MRI only) 13. IVDD (Disc bulge, protrusion, herniation, extrusion) (Level, laterality, extent)    Standing Status:   Future    Standing Expiration Date:   04/12/2022    Scheduling Instructions:     Imaging must be done as soon as possible. Inform patient that order will expire within 30 days and I will  not renew it.    Order Specific Question:   What is the patient's sedation requirement?    Answer:   No Sedation    Order Specific Question:   Does the patient have a pacemaker or implanted devices?    Answer:   No    Order Specific Question:   Preferred imaging location?    Answer:   [facility] (table limit-350lbs)    Order Specific Question:   Call Results- Best Contact Number?  Answer:   [phone] ([facility])    Order Specific Question:   Radiology Contrast Protocol - do NOT remove file path    Answer:   [email or web address]   Follow-up plan:   Return for Eval-day (M,W), (F2F), for review of ordered tests (MRI).     Interventional Therapies  Risk  Complexity Considerations:   WNL   Planned  Pending:   Therapeutic left lumbar facet MBB (L2-S1) #4 (12/26/2021)    Under consideration:   Diagnostic lumbar MRI  Diagnostic left hip MRI  Diagnostic sacral MRI  Therapeutic left lumbar (L4-S1) MB RFA #2    Completed:   Diagnostic left ischial bursa inj. x1 (11/23/2021) (9 to 0/10) (30/70/0/0)  Diagnostic left thoracolumbar facet MB (T12-L3) Blk x2 (07/24/2018) (100/100/100/90-100)  Therapeutic left lumbar facet MB (L2-S1) Blk x3 (12/27/2020) (100/100/80/100)  Diagnostic left lumbar facet MB (L4-S1) Blk x1 (12/27/2020) (100/100/80/100) until 6/23. Therapeutic left thoracolumbar MB (T12-L3) RFA x2 (04/26/2020) (100/100/80/80)  Therapeutic left lumbar MB (L4-S1) RFA x1 (07/28/2019) (100/100/100/100)    Completed by other providers:   Therapeutic left lumbar facet (L4, L5) MB RFA x1 (10/04/2017) by Arloa Koh, MD [facility])  Therapeutic right L4-5, L5-S1 facet joint RFA x1 (10/17/2016) by Arloa Koh, MD [facility])  Note: According to the description of this procedure, radiofrequency was applied to the RIGHT L4 and the L5 medial branch at the junction of the superior articular process and the transverse process  of the L4-5 facet joint and at the base of the right sacral ala for the L5 medial branch. Diagnostic left lumbar facet (L3, L5, SA*) MB RFA x1 (04/11/2016) by Arloa Koh, MD [facility] center)  Diagnostic left lumbar facet (L4, L5, SA*) MBB x2 (02/22/2016, 03/20/2016) by Arloa Koh, MD [facility] spine center)  Diagnostic bilateral L4-5, L5-S1 IA facet joint inj. x1 (12/30/2015) by Arloa Koh, MD [facility])  * (SA = Sacral Ala)    Therapeutic  Palliative (PRN) options:   Palliative left thoracolumbar (T12, L1, L2, & L3) MB RFA   Palliative left lumbar (L4, L5, S1) MB RFA        Recent Visits Date Type Provider Dept  01/11/22 Office Visit Milinda Pointer, MD [facility]  12/26/21 Procedure visit Milinda Pointer, MD [facility]  12/19/21 Office Visit Milinda Pointer, MD [facility]  11/23/21 Procedure visit Milinda Pointer, MD [facility]  10/31/21 Office Visit Milinda Pointer, MD [facility]  Showing recent visits within past 90 days and meeting all other requirements Future Appointments Date Type Provider Dept  02/19/22 Appointment Milinda Pointer, MD [facility]  Showing future appointments within next 90 days and meeting all other requirements  I discussed the assessment and treatment plan with the patient. The patient was provided an opportunity to ask questions and all were answered. The patient agreed with the plan and demonstrated an understanding of the instructions.  Patient advised to call back or seek an in-person evaluation if the symptoms or condition worsens.  Duration of encounter: 38 minutes.  Total time on encounter, as per AMA guidelines included both the face-to-face and non-face-to-face time personally spent by the physician and/or other qualified health care professional(s) on the day of the encounter (includes time in activities that require the physician or other  qualified health care professional and does not include time in activities normally performed by clinical staff). Physician's time may include the following activities when performed:  preparing to see the patient (eg, review of tests, pre-charting review of records) obtaining and/or reviewing separately obtained history performing a medically appropriate examination and/or evaluation counseling and educating the patient/family/caregiver ordering medications, tests, or procedures referring and communicating with other health care professionals (when not separately reported) documenting clinical information in the electronic or other health record independently interpreting results (not separately reported) and communicating results to the patient/ family/caregiver care coordination (not separately reported)  Note by: Gaspar Cola, MD Date: 01/11/2022; Time: 10:36 AM

## 2022-01-11 NOTE — Patient Instructions (Signed)
____________________________________________________________________________________________  Patient Information update  To: All of our patients.  Re: Name change.  It has been made official that our current name, "[facility]"   will soon be changed to "[facility]".   The purpose of this change is to eliminate any confusion created by the concept of our practice being a "Medication Management Pain Clinic". In the past this has led to the misconception that we treat pain primarily by the use of prescription medications.  Nothing can be farther from the truth.   Understanding PAIN MANAGEMENT: To further understand what our practice does, you first have to understand that "Pain Management" is a subspecialty that requires additional training once a physician has completed their specialty training, which can be in either Anesthesia, Neurology, Psychiatry, or Physical Medicine and Rehabilitation (PMR). Each one of these contributes to the final approach taken by each physician to the management of their patient's pain. To be a "Pain Management Specialist" you must have first completed one of the specialty trainings below.  Anesthesiologists - trained in clinical pharmacology and interventional techniques such as nerve blockade and regional as well as central neuroanatomy. They are trained to block pain before, during, and after surgical interventions.  Neurologists - trained in the diagnosis and pharmacological treatment of complex neurological conditions, such as Multiple Sclerosis, Parkinson's, spinal cord injuries, and other systemic conditions that may be associated with symptoms that may include but are not limited to pain. They tend to rely primarily on the treatment of chronic pain using prescription medications.  Psychiatrist - trained in conditions affecting the psychosocial  wellbeing of patients including but not limited to depression, anxiety, schizophrenia, personality disorders, addiction, and other substance use disorders that may be associated with chronic pain. They tend to rely primarily on the treatment of chronic pain using prescription medications.   Physical Medicine and Rehabilitation (PMR) physicians, also known as physiatrists - trained to treat a wide variety of medical conditions affecting the brain, spinal cord, nerves, bones, joints, ligaments, muscles, and tendons. Their training is primarily aimed at treating patients that have suffered injuries that have caused severe physical impairment. Their training is primarily aimed at the physical therapy and rehabilitation of those patients. They may also work alongside orthopedic surgeons or neurosurgeons using their expertise in assisting surgical patients to recover after their surgeries.  INTERVENTIONAL PAIN MANAGEMENT is sub-subspecialty of Pain Management.  Our physicians are Board-certified in Anesthesia, Pain Management, and Interventional Pain Management.  This meaning that not only have they been trained and Board-certified in their specialty of Anesthesia, and subspecialty of Pain Management, but they have also received further training in the sub-subspecialty of Interventional Pain Management, in order to become Board-certified as INTERVENTIONAL PAIN MANAGEMENT SPECIALIST.    Mission: Our goal is to use our skills in  INTERVENTIONAL PAIN MANAGEMENT as alternatives to the chronic use of prescription opioid medications for the treatment of pain. To make this more clear, we have changed our name to reflect what we do and offer. We will continue to offer medication management assessment and recommendations, but we will not be taking over any patient's medication management.  ____________________________________________________________________________________________      ____________________________________________________________________________________________  National Pain Medication Shortage  The [place] is experiencing worsening drug shortages. These have had a negative widespread effect on patient care and treatment. Not expected to improve any time soon. Predicted to last past 2029.   Drug shortage list (generic   names) Oxycodone IR Oxycodone/APAP Oxymorphone IR Hydromorphone Hydrocodone/APAP Morphine  Where is the problem?  Manufacturing and supply level.  Will this shortage affect you?  Only if you take any of the above pain medications.  How? You may be unable to fill your prescription.  Your pharmacist may offer a "partial fill" of your prescription. (Warning: Do not accept partial fills.) Prescriptions partially filled cannot be transferred to another pharmacy. Read our Medication Rules and Regulation. Depending on how much medicine you are dependent on, you may experience withdrawals when unable to get the medication.  Recommendations: Consider ending your dependence on opioid pain medications. Ask your pain specialist to assist you with the process. Consider switching to a medication currently not in shortage, such as Buprenorphine. Talk to your pain specialist about this option. Consider decreasing your pain medication requirements by managing tolerance thru "Drug Holidays". This may help minimize withdrawals, should you run out of medicine. Control your pain thru the use of non-pharmacological interventional therapies.   Your prescriber: Prescribers cannot be blamed for shortages. Medication manufacturing and supply issues cannot be fixed by the prescriber.   NOTE: The prescriber is not responsible for supplying the medication, or solving supply issues. Work with your pharmacist to solve it. The patient is responsible for the decision to take or continue taking the medication and for identifying and securing a legal supply source. By  law, supplying the medication is the job and responsibility of the pharmacy. The prescriber is responsible for the evaluation, monitoring, and prescribing of these medications.   Prescribers will NOT: Re-issue prescriptions that have been partially filled. Re-issue prescriptions already sent to a pharmacy.  Re-send prescriptions to a different pharmacy because yours did not have your medication. Ask pharmacist to order more medicine or transfer the prescription to another pharmacy. (Read below.)  New 2023 regulation: "September 29, 2021 Revised Regulation Allows DEA-Registered Pharmacies to Transfer Electronic Prescriptions at a Patient's Request DEA Headquarters Division - Public Information Office Patients now have the ability to request their electronic prescription be transferred to another pharmacy without having to go back to their practitioner to initiate the request. This revised regulation went into effect on Monday, September 25, 2021.     At a patient's request, a DEA-registered retail pharmacy can now transfer an electronic prescription for a controlled substance (schedules II-V) to another DEA-registered retail pharmacy. Prior to this change, patients would have to go through their practitioner to cancel their prescription and have it re-issued to a different pharmacy. The process was taxing and time consuming for both patients and practitioners.    The Drug Enforcement Administration (DEA) published its intent to revise the process for transferring electronic prescriptions on December 18, 2019.  The final rule was published in the federal register on August 24, 2021 and went into effect 30 days later.  Under the final rule, a prescription can only be transferred once between pharmacies, and only if allowed under existing state or other applicable law. The prescription must remain in its electronic form; may not be altered in any way; and the transfer must be communicated directly between  two licensed pharmacists. It's important to note, any authorized refills transfer with the original prescription, which means the entire prescription will be filled at the same pharmacy".  Reference: https://www.dea.gov/stories/2023/2023-09/2021-09-01/revised-regulation-allows-dea-registered-pharmacies-transfer (DEA website announcement)  https://www.govinfo.gov/content/pkg/FR-2021-08-24/pdf/2023-15847.pdf (Federal Register  Department of Justice)   Federal Register / Vol. 88, No. 143 / Thursday, August 24, 2021 / Rules and Regulations DEPARTMENT OF JUSTICE  Drug Enforcement   Administration  21 CFR Part 1306  [Docket No. [id number]]  RIN 1117-AB64 Transfer of Electronic Prescriptions for Schedules II-V Controlled Substances Between Pharmacies for Initial Filling  ____________________________________________________________________________________________     

## 2022-01-17 ENCOUNTER — Ambulatory Visit
Admission: RE | Admit: 2022-01-17 | Discharge: 2022-01-17 | Disposition: A | Discharge status: Home / Self Care | Payer: Medicare HMO | Source: Ambulatory Visit | Attending: Pain Medicine | Admitting: Pain Medicine

## 2022-01-17 DIAGNOSIS — M7072 Other bursitis of hip, left hip: Secondary | ICD-10-CM | POA: Diagnosis not present

## 2022-01-17 DIAGNOSIS — M47816 Spondylosis without myelopathy or radiculopathy, lumbar region: Secondary | ICD-10-CM | POA: Diagnosis not present

## 2022-01-17 DIAGNOSIS — M5442 Lumbago with sciatica, left side: Secondary | ICD-10-CM | POA: Diagnosis not present

## 2022-01-17 DIAGNOSIS — M25552 Pain in left hip: Secondary | ICD-10-CM | POA: Diagnosis not present

## 2022-01-17 DIAGNOSIS — S32010S Wedge compression fracture of first lumbar vertebra, sequela: Secondary | ICD-10-CM | POA: Diagnosis not present

## 2022-01-17 DIAGNOSIS — M5136 Other intervertebral disc degeneration, lumbar region: Secondary | ICD-10-CM | POA: Insufficient documentation

## 2022-01-17 DIAGNOSIS — M7918 Myalgia, other site: Secondary | ICD-10-CM | POA: Diagnosis not present

## 2022-01-17 DIAGNOSIS — G8929 Other chronic pain: Secondary | ICD-10-CM | POA: Insufficient documentation

## 2022-01-17 DIAGNOSIS — G894 Chronic pain syndrome: Secondary | ICD-10-CM | POA: Insufficient documentation

## 2022-01-17 DIAGNOSIS — M5126 Other intervertebral disc displacement, lumbar region: Secondary | ICD-10-CM | POA: Diagnosis not present

## 2022-01-17 DIAGNOSIS — M5137 Other intervertebral disc degeneration, lumbosacral region: Secondary | ICD-10-CM | POA: Diagnosis not present

## 2022-01-23 ENCOUNTER — Ambulatory Visit: Admission: RE | Admit: 2022-01-23 | Payer: Medicare HMO | Source: Ambulatory Visit

## 2022-01-31 ENCOUNTER — Telehealth: Payer: Self-pay | Admitting: Pain Medicine

## 2022-01-31 ENCOUNTER — Other Ambulatory Visit: Payer: Self-pay | Admitting: Pain Medicine

## 2022-01-31 DIAGNOSIS — M549 Dorsalgia, unspecified: Secondary | ICD-10-CM | POA: Insufficient documentation

## 2022-01-31 DIAGNOSIS — M7918 Myalgia, other site: Secondary | ICD-10-CM

## 2022-01-31 NOTE — Telephone Encounter (Signed)
Patient informed of below. She wants to do the PT at [facility] in [place]. Please send the referral there.

## 2022-01-31 NOTE — Progress Notes (Signed)
The patient's lumbar MRI do not show any acute pathology.  It also does not show any significant changes from her last MRI.  Her pain at this point is very likely to be musculoskeletal.  For this reason I will be ordering physical therapy.  However, this patient has had a recent physical therapy referral on 08/22/2021 and I will be talking to her about this since it is not appropriate to have a chronic pain patient going to physical therapy forever.  She needs to learn the exercises and continue doing them at home, by herself.

## 2022-01-31 NOTE — Telephone Encounter (Signed)
Patient wants to see if Dr Dossie Arbour will do a VV review of her MRI. She says she is having a lot of pain and if she needs to start some kind of pt or treatment she wants to get on with it. Please ask if we can schedule VV instead of patient coming in on 02-19-22

## 2022-02-02 ENCOUNTER — Other Ambulatory Visit: Payer: Self-pay | Admitting: Nurse Practitioner

## 2022-02-02 ENCOUNTER — Ambulatory Visit: Payer: Self-pay

## 2022-02-02 DIAGNOSIS — M545 Low back pain, unspecified: Secondary | ICD-10-CM

## 2022-02-02 DIAGNOSIS — G8929 Other chronic pain: Secondary | ICD-10-CM

## 2022-02-02 MED ORDER — LIDOCAINE 1.8 % EX PTCH: 1.0000 | MEDICATED_PATCH | Freq: Every day | CUTANEOUS | 1 refills | Status: DC

## 2022-02-02 NOTE — Telephone Encounter (Signed)
Summary: back pain advice   Pt wants to know if there is anything else she can take for pain/inflammation, for the pain in her back.  Pt states she is going to have to return to PT, but in the meantime...the patient had a bleeding ulcer Dec 2022, which has put limits on what she can take.  Please advise     Chief Complaint: Low back and left hip pain. Asking for medication for inflammation. Symptoms: Above Frequency: JUly Pertinent Negatives: Patient denies  Disposition: [] ED /[] Urgent Care (no appt availability in office) / [] Appointment(In office/virtual)/ []  [facility]/ [] Home Care/ [] Refused Recommended Disposition /[] [facility]/ [x]  Follow-up with PCP Additional Notes: Please advise pt.  Answer Assessment - Initial Assessment Questions 1. ONSET: "When did the pain begin?"      July 2. LOCATION: "Where does it hurt?" (upper, mid or lower back)     Low back 3. SEVERITY: "How bad is the pain?"  (e.g., Scale 1-10; mild, moderate, or severe)   - MILD (1-3): Doesn't interfere with normal activities.    - MODERATE (4-7): Interferes with normal activities or awakens from sleep.    - SEVERE (8-10): Excruciating pain, unable to do any normal activities.      7-8 4. PATTERN: "Is the pain constant?" (e.g., yes, no; constant, intermittent)      Constant 5. RADIATION: "Does the pain shoot into your legs or somewhere else?"     Yes 6. CAUSE:  "What do you think is causing the back pain?"      Unsure 7. BACK OVERUSE:  "Any recent lifting of heavy objects, strenuous work or exercise?"     No 8. MEDICINES: "What have you taken so far for the pain?" (e.g., nothing, acetaminophen, NSAIDS)     Tylenol 9. NEUROLOGIC SYMPTOMS: "Do you have any weakness, numbness, or problems with bowel/bladder control?"     No 10. OTHER SYMPTOMS: "Do you have any other symptoms?" (e.g., fever, abdomen pain, burning with urination, blood in urine)       No 11. PREGNANCY: "Is there any chance  you are pregnant?" "When was your last menstrual period?"       No  Protocols used: Back Pain-A-AH

## 2022-02-02 NOTE — Telephone Encounter (Signed)
Patient notified

## 2022-02-02 NOTE — Telephone Encounter (Signed)
FYI

## 2022-02-02 NOTE — Telephone Encounter (Signed)
Patient notified and stated she contact pain management and they told her to contact our office

## 2022-02-05 ENCOUNTER — Telehealth: Payer: Self-pay | Admitting: Pain Medicine

## 2022-02-05 NOTE — Telephone Encounter (Signed)
We are waiting on response from [facility].  Referral was sent last week. I moved her up to 02-14-22 at 10:20

## 2022-02-05 NOTE — Telephone Encounter (Signed)
Can she get a sooner appt? Or get a VV?

## 2022-02-05 NOTE — Telephone Encounter (Signed)
Patient states she is in a lot of pain and does not know what to do for it. She has appt on 02-19-22

## 2022-02-05 NOTE — Telephone Encounter (Signed)
Requested medications are due for refill today.  See note  Requested medications are on the active medications list.    Last refill.   Future visit scheduled.   yes  Notes to clinic.  Pharmacy comment: Alternative Requested:THE PRESCRIBED MEDICATION IS NOT COVERED BY INSURANCE. PLEASE CONSIDER CHANGING TO ONE OF THE SUGGESTED COVERED ALTERNATIVES.     Requested Prescriptions  Pending Prescriptions Disp Refills   lidocaine (LIDODERM) 5 % [Pharmacy Med Name: LIDOCAINE 5% PATCH]  0     Analgesics:  Topicals Failed - 02/02/2022  4:39 PM      Failed - Manual Review: Labs are only required if the patient has taken medication for more than 8 weeks.      Passed - PLT in normal range and within 360 days    Platelets  Date Value Ref Range Status  12/18/2021 311 140 - 400 Thousand/uL Final   Platelet  Date Value Ref Range Status  08/12/2011 255 150 - 440 x10 3/mm 3 Final         Passed - HGB in normal range and within 360 days    Hemoglobin  Date Value Ref Range Status  12/18/2021 14.5 11.7 - 15.5 g/dL Final   HGB  Date Value Ref Range Status  08/12/2011 12.4 12.0 - 16.0 g/dL Final         Passed - HCT in normal range and within 360 days    HCT  Date Value Ref Range Status  12/18/2021 41.9 35.0 - 45.0 % Final  08/12/2011 35.0 35.0 - 47.0 % Final         Passed - Cr in normal range and within 360 days    Creat  Date Value Ref Range Status  01/08/2022 0.67 0.50 - 1.05 mg/dL Final         Passed - eGFR is 30 or above and within 360 days    EGFR (African American)  Date Value Ref Range Status  08/12/2011 >60  Final   GFR calc Af Amer  Date Value Ref Range Status  01/01/2018 81 >59 mL/min/1.73 Final   EGFR (Non-African Amer.)  Date Value Ref Range Status  08/12/2011 >60  Final    Comment:    eGFR values <80mL/min/1.73 m2 may be an indication of chronic kidney disease (CKD). Calculated eGFR is useful in patients with stable renal function. The eGFR calculation will  not be reliable in acutely ill patients when serum creatinine is changing rapidly. It is not useful in  patients on dialysis. The eGFR calculation may not be applicable to patients at the low and high extremes of body sizes, pregnant women, and vegetarians.    GFR, Estimated  Date Value Ref Range Status  01/31/2021 >60 >60 mL/min Final    Comment:    (NOTE) Calculated using the CKD-EPI Creatinine Equation (2021)    eGFR  Date Value Ref Range Status  01/08/2022 96 > OR = 60 mL/min/1.52m2 Final         Passed - Patient is not pregnant      Passed - Valid encounter within last 12 months    Recent Outpatient Visits           4 weeks ago Left arm pain   [facility] Bo Merino, FNP   1 month ago Nocturia   [place], FNP   1 month ago Hypertension, unspecified type   [facility] [place], Myna Hidalgo, [place]  2 months ago Incontinence of feces, unspecified fecal incontinence type   [facility] Berniece Salines, FNP   4 months ago Chills (without fever)   [facility] Berniece Salines, FNP       Future Appointments             In 1 week  [facility]   In 4 months Zane Herald, Rudolpho Sevin, FNP [facility], [facility]

## 2022-02-12 DIAGNOSIS — M6281 Muscle weakness (generalized): Secondary | ICD-10-CM | POA: Diagnosis not present

## 2022-02-12 DIAGNOSIS — M25552 Pain in left hip: Secondary | ICD-10-CM | POA: Diagnosis not present

## 2022-02-12 DIAGNOSIS — M545 Low back pain, unspecified: Secondary | ICD-10-CM | POA: Diagnosis not present

## 2022-02-12 NOTE — Patient Instructions (Signed)
  ____________________________________________________________________________________________  Patient Information update  To: All of our patients.  Re: Name change.  It has been made official that our current name, "[facility]"   will soon be changed to "[facility]".   The purpose of this change is to eliminate any confusion created by the concept of our practice being a "Medication Management Pain Clinic". In the past this has led to the misconception that we treat pain primarily by the use of prescription medications.  Nothing can be farther from the truth.   Understanding PAIN MANAGEMENT: To further understand what our practice does, you first have to understand that "Pain Management" is a subspecialty that requires additional training once a physician has completed their specialty training, which can be in either Anesthesia, Neurology, Psychiatry, or Physical Medicine and Rehabilitation (PMR). Each one of these contributes to the final approach taken by each physician to the management of their patient's pain. To be a "Pain Management Specialist" you must have first completed one of the specialty trainings below.  Anesthesiologists - trained in clinical pharmacology and interventional techniques such as nerve blockade and regional as well as central neuroanatomy. They are trained to block pain before, during, and after surgical interventions.  Neurologists - trained in the diagnosis and pharmacological treatment of complex neurological conditions, such as Multiple Sclerosis, Parkinson's, spinal cord injuries, and other systemic conditions that may be associated with symptoms that may include but are not limited to pain. They tend to rely primarily on the treatment of chronic pain using prescription medications.  Psychiatrist - trained in conditions affecting the psychosocial  wellbeing of patients including but not limited to depression, anxiety, schizophrenia, personality disorders, addiction, and other substance use disorders that may be associated with chronic pain. They tend to rely primarily on the treatment of chronic pain using prescription medications.   Physical Medicine and Rehabilitation (PMR) physicians, also known as physiatrists - trained to treat a wide variety of medical conditions affecting the brain, spinal cord, nerves, bones, joints, ligaments, muscles, and tendons. Their training is primarily aimed at treating patients that have suffered injuries that have caused severe physical impairment. Their training is primarily aimed at the physical therapy and rehabilitation of those patients. They may also work alongside orthopedic surgeons or neurosurgeons using their expertise in assisting surgical patients to recover after their surgeries.  INTERVENTIONAL PAIN MANAGEMENT is sub-subspecialty of Pain Management.  Our physicians are Board-certified in Anesthesia, Pain Management, and Interventional Pain Management.  This meaning that not only have they been trained and Board-certified in their specialty of Anesthesia, and subspecialty of Pain Management, but they have also received further training in the sub-subspecialty of Interventional Pain Management, in order to become Board-certified as INTERVENTIONAL PAIN MANAGEMENT SPECIALIST.    Mission: Our goal is to use our skills in  INTERVENTIONAL PAIN MANAGEMENT as alternatives to the chronic use of prescription opioid medications for the treatment of pain. To make this more clear, we have changed our name to reflect what we do and offer. We will continue to offer medication management assessment and recommendations, but we will not be taking over any patient's medication management.  ____________________________________________________________________________________________     

## 2022-02-13 ENCOUNTER — Ambulatory Visit (INDEPENDENT_AMBULATORY_CARE_PROVIDER_SITE_OTHER): Payer: Medicare HMO

## 2022-02-13 VITALS — Ht 62.0 in | Wt 115.0 lb

## 2022-02-13 DIAGNOSIS — Z0000 Encounter for general adult medical examination without abnormal findings: Secondary | ICD-10-CM | POA: Diagnosis not present

## 2022-02-13 DIAGNOSIS — Z13820 Encounter for screening for osteoporosis: Secondary | ICD-10-CM

## 2022-02-13 DIAGNOSIS — R937 Abnormal findings on diagnostic imaging of other parts of musculoskeletal system: Secondary | ICD-10-CM | POA: Insufficient documentation

## 2022-02-13 NOTE — Progress Notes (Signed)
PROVIDER NOTE: Information contained herein reflects review and annotations entered in association with encounter. Interpretation of such information and data should be left to medically-trained personnel. Information provided to patient can be located elsewhere in the medical record under "Patient Instructions". Document created using STT-dictation technology, any transcriptional errors that may result from process are unintentional.    Patient: Angela Espinoza  Service Category: E/M  Provider: Gaspar Cola, MD  DOB: [birth year]  DOS: 02/14/2022  Referring Provider: Bo Merino, FNP  MRN: [id number]  Specialty: Interventional Pain Management  PCP: Bo Merino, FNP  Type: Established Patient  Setting: Ambulatory outpatient    Location: Office  Delivery: Face-to-face     HPI  Angela Espinoza, a 67 y.o. year old female, is here today because of her Abnormal MRI, lumbar spine [R93.7]. Angela Espinoza primary complain today is Back Pain (Left lower) Last encounter: My last encounter with her was on 02/05/2022. 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, sequela (2016); Spondylosis without myelopathy or radiculopathy, lumbar region; Lumbar facet syndrome (Left); DDD (degenerative disc disease), lumbosacral; Thoracic facet syndrome (Left); Spondylosis without myelopathy or radiculopathy, lumbosacral region; Spondylosis without myelopathy or radiculopathy, thoracic region; DDD (degenerative disc disease), thoracolumbar; Neurogenic pain; Other intervertebral disc degeneration, thoracic region; Other intervertebral disc degeneration, lumbar region; Gluteal pain (Left); Muscle pain, myofascial (Left buttocks); Sciatica of left side; Ischial bursitis (Left); Chronic hip pain (Left); Chronic low back pain (Left) w/o sciatica; Chronic low back pain (Left) w/ sciatica (Left);  Musculoskeletal back pain; and Abnormal (Mildly)  MRI, lumbar spine (01/21/2022) on their pertinent problem list. Pain Assessment: Severity of Chronic pain is reported as a 6 /10. Location: Back  /pain radiaities down the back of her left thigh. Onset: More than a month ago. Quality: Aching, Throbbing, Shooting, Sharp, Stabbing, Burning, Constant. Timing: Constant. Modifying factor(s): laying down. Vitals:  height is 5\' 2"  (1.575 m) and weight is 115 lb (52.2 kg). Her temperature is 97.2 F (36.2 C) (abnormal). Her blood pressure is 131/71 and her pulse is 70. Her oxygen saturation is 100%.  BMI: Estimated body mass index is 21.03 kg/m as calculated from the following:   Height as of this encounter: 5\' 2"  (1.575 m).   Weight as of this encounter: 115 lb (52.2 kg).  Reason for encounter: follow-up evaluation to review the results of the ordered MRI. The patient's lumbar MRI does not show any acute pathology. It also does not show any significant changes from her last MRI. Her pain at this point is very likely to be musculoskeletal. For this reason I ordered physical therapy on 01/31/2022. However, this patient Espinoza had a recent physical therapy referral on 08/22/2021.  She needs to learn the exercises and continue doing them at home, by herself since it is not appropriate to have a chronic pain patient going to physical therapy forever.    Today the patient came in indicating that she Espinoza seen some improvement with the physical therapy which confirms that the bulk of her pain is myofascial in origin.  I have recommended that she keep a healthy diet, avoid smoking, and exercise on a regular basis concentrating mostly on stretching exercises.  She had questions regarding Tylenol and I have informed the patient that as long as her hepatic function is within normal limits she to should try to limit her use of Tylenol to no more than 3000 mg/day or  the equivalent of 6 of the 500 mg tablets.  He already had  issues with the NSAIDs and therefore we will try to stay away from those but we have recommended that she use turmeric.  I also provided the patient with information to using the rule of thumb that if she is doing any type of activity where it is hurting while she is doing it, probably is not a good thing.  She also had some questions as to whether medications that she can take for her pain and I have recommended that she stay away from any type of opioid analgesics.  She asked about creams and I have reminded the patient that they are many different types of there and the only thing that I would recommend is that she go out and try those that she is not allergic to and see if they provide her with any benefit.  If they do, then as long as it is an over-the-counter medication it would probably be okay for her to use it within the parameters given by the manufacturer.  She had some questions with regards to "dry needling" which distress recommended to her by the physical therapist.  I have informed the patient that this is a valid technique to try myofascial pain and therefore she can go ahead and try it and see if it does help her.  Today I provided her with a hard copy of her hip and lumbar MRI and I have explained to her the results in layman's terms.  She came in accompanied by her husband and all of this information was provided to both them at same time.  I have informed her that at this point in time, the information provided by the MRIs would suggest that there is no need for any type of surgeries, again, at this time.  Above all, I make sure that she understood that she needs to have realistic expectations and that most of her pain seems to be myofascial in origin.  She probably does have a component associated with some arthritis, but again all of these are things that are chronic and will not go away.  Today I have left the door open for the patient to call us in the event that she Espinoza any  further questions or Espinoza a flareup of her pain that may require the use of further interventional therapies.  For the time being, I have explained to the patient that I see no need or benefit of any further interventional therapies, at this time.  She understood and accepted.  Pharmacotherapy Assessment  Analgesic: No chronic opioid analgesics therapy prescribed by our practice. MME/day: 0 mg/day   Monitoring: [place] PMP: PDMP reviewed during this encounter.       Pharmacotherapy: No side-effects or adverse reactions reported. Compliance: No problems identified. Effectiveness: Clinically acceptable.  Brigitte Pulse, RN  02/14/2022 10:23 AM  Sign when Signing Visit Safety precautions to be maintained throughout the outpatient stay will include: orient to surroundings, keep bed in low position, maintain call bell within reach at all times, provide assistance with transfer out of bed and ambulation.     No results found for: "CBDTHCR" No results found for: "D8THCCBX" No results found for: "D9THCCBX"  UDS:  Summary  Date Value Ref Range Status  12/10/2017 FINAL  Final    Comment:    ==================================================================== [facility] COMP DRUG ANALYSIS,UR ==================================================================== Test  Result       Flag       Units Drug Present and Declared for Prescription Verification   Cyclobenzaprine                PRESENT      EXPECTED   Amitriptyline                  PRESENT      EXPECTED   Metoprolol                     PRESENT      EXPECTED Drug Absent but Declared for Prescription Verification   Desmethylcyclobenzaprine       Not Detected UNEXPECTED   Duloxetine                     Not Detected UNEXPECTED ==================================================================== Test                      Result    Flag   Units      Ref Range   Creatinine              28               mg/dL       >=20 ==================================================================== Declared Medications:  The flagging and interpretation on this report are based on the  following declared medications.  Unexpected results may arise from  inaccuracies in the declared medications.  **Note: The testing scope of this panel includes these medications:  Amitriptyline  Cyclobenzaprine  Duloxetine  Metoprolol  **Note: The testing scope of this panel does not include following  reported medications:  Cholecalciferol  Hydrochlorothiazide  Levocetirizine  Losartan (Losartan Potassium)  Magnesium  Meloxicam  Multivitamin  Supplement  Supplement (Omega-3)  Vitamin B (Biotin)  Vitamin C ==================================================================== For clinical consultation, please call [phone]. ====================================================================       ROS  Constitutional: Denies any fever or chills Gastrointestinal: No reported hemesis, hematochezia, vomiting, or acute GI distress Musculoskeletal: Denies any acute onset joint swelling, redness, loss of ROM, or weakness Neurological: No reported episodes of acute onset apraxia, aphasia, dysarthria, agnosia, amnesia, paralysis, loss of coordination, or loss of consciousness  Medication Review  Black Currant Seed Oil, Black Elderberry, Black Pepper-Turmeric, DULoxetine, Garlic, Omega-3 Fatty Acids, Sodium Fluoride, ascorbic acid, cholecalciferol, co-enzyme Q-10, famotidine, levocetirizine, metoprolol tartrate, and multivitamin  History Review  Allergy: Angela Espinoza is allergic to mobic [meloxicam] and indapamide. Drug: Angela Espinoza  reports no history of drug use. Alcohol:  reports that she does not currently use alcohol. Tobacco:  reports that she Espinoza never smoked. She Espinoza been exposed to tobacco smoke. She Espinoza never used smokeless tobacco. Social: Angela Espinoza  reports that she Espinoza never smoked. She Espinoza been  exposed to tobacco smoke. She Espinoza never used smokeless tobacco. She reports that she does not currently use alcohol. She reports that she does not use drugs. Medical:  Espinoza a past medical history of Anxiety, Asthma, Depression, GERD (gastroesophageal reflux disease), Hyperlipidemia, Hypertension, and IBS (irritable bowel syndrome). Surgical: Angela Espinoza  Espinoza a past surgical history that includes Oophorectomy; Exploratory laparotomy; Foot surgery; Colonoscopy; Tonsillectomy; Axillary lymph node biopsy (Right, 08/03/2014); Kyphoplasty (N/A, 12/10/2014); Abdominal hysterectomy (age 27); Breast biopsy (Left, 10/07/2018); Breast biopsy (Left, 10/21/2019); Esophagogastroduodenoscopy (egd) with propofol (N/A, 01/26/2021); Esophagogastroduodenoscopy (egd) with propofol (N/A, 04/17/2021); Diagnostic laparoscopy; Esophagogastroduodenoscopy (egd) with propofol (N/A, 05/02/2021); Esophagogastroduodenoscopy (egd) with propofol (N/A, 06/06/2021); Cataract extraction  w/PHACO (Left, 10/24/2021); and Cataract extraction w/PHACO (Right, 11/07/2021). Family: family history includes Cancer in her father; Heart attack in her mother; Heart disease in her mother; Lung cancer in her father.  Laboratory Chemistry Profile   Renal Lab Results  Component Value Date   BUN 10 01/08/2022   CREATININE 0.67 01/08/2022   BCR SEE NOTE: 01/08/2022   GFRAA 81 01/01/2018   GFRNONAA >60 01/31/2021    Hepatic Lab Results  Component Value Date   AST 22 01/08/2022   ALT 25 01/08/2022   ALBUMIN 2.2 (L) 01/26/2021   ALKPHOS 46 01/26/2021   LIPASE 27 01/26/2021    Electrolytes Lab Results  Component Value Date   NA 134 (L) 01/08/2022   K 4.0 01/08/2022   CL 97 (L) 01/08/2022   CALCIUM 9.8 01/08/2022   MG 1.9 01/30/2021   PHOS 2.3 (L) 01/30/2021    Bone Lab Results  Component Value Date   25OHVITD1 40 01/01/2018   25OHVITD2 <1.0 01/01/2018   25OHVITD3 40 01/01/2018    Inflammation (CRP: Acute Phase) (ESR: Chronic  Phase) Lab Results  Component Value Date   CRP 1 12/10/2017   ESRSEDRATE 3 12/10/2017   LATICACIDVEN 0.9 01/26/2021         Note: Above Lab results reviewed.  Recent Imaging Review  MR LUMBAR SPINE WO CONTRAST CLINICAL DATA:  Low back pain for over 6 weeks. No known injury. History of prior L1 vertebroplasty.  EXAM: MRI LUMBAR SPINE WITHOUT CONTRAST  TECHNIQUE: Multiplanar, multisequence MR imaging of the lumbar spine was performed. No intravenous contrast was administered.  COMPARISON:  12/03/2014  FINDINGS: Segmentation:  Standard.  Alignment:  Physiologic.  Vertebrae: No acute fracture, evidence of discitis, or aggressive bone lesion. Chronic L1 vertebral body compression fracture with approximately 50% height loss and prior augmentation. 3 mm retropulsion of the superior posterior margin of the L1 vertebral body mildly impressing upon the thecal sac.  Conus medullaris and cauda equina: Conus extends to the L1-2 level. Conus and cauda equina appear normal.  Paraspinal and other soft tissues: No acute paraspinal abnormality.  Disc levels:  Disc spaces: Disc desiccation throughout the lumbar spine. Mild disc height loss at T11-12.  T11-12: Mild broad-based disc bulge. No foraminal or central canal stenosis.  T12-L1: Mild broad-based disc bulge. No foraminal or central canal stenosis.  L1-L2: Mild broad-based disc bulge. No foraminal or central canal stenosis.  L2-L3: No significant disc bulge. No neural foraminal stenosis. No central canal stenosis.  L3-L4: No significant disc bulge. No neural foraminal stenosis. No central canal stenosis.  L4-L5: Mild broad-based disc bulge. No foraminal or central canal stenosis.  L5-S1: Mild broad-based disc bulge. No foraminal or central canal stenosis.  IMPRESSION: 1. No acute osseous injury of the lumbar spine. 2. Chronic L1 vertebral body compression fracture with approximately 50% height loss and prior  augmentation.  Electronically Signed   By: Kathreen Devoid M.D.   On: 01/21/2022 08:00 Note: Reviewed        Physical Exam  General appearance: Well nourished, well developed, and well hydrated. In no apparent acute distress Mental status: Alert, oriented x 3 (person, place, & time)       Respiratory: No evidence of acute respiratory distress Eyes: PERLA Vitals: BP 131/71   Pulse 70   Temp (!) 97.2 F (36.2 C)   Ht 5\' 2"  (1.575 m)   Wt 115 lb (52.2 kg)   SpO2 100%   BMI 21.03 kg/m  BMI: Estimated body mass  index is 21.03 kg/m as calculated from the following:   Height as of this encounter: 5\' 2"  (1.575 m).   Weight as of this encounter: 115 lb (52.2 kg). Ideal: Ideal body weight: 50.1 kg (110 lb 7.2 oz) Adjusted ideal body weight: 50.9 kg (112 lb 4.3 oz)  Assessment   Diagnosis Status  1. Abnormal (Mildly)  MRI, lumbar spine (01/21/2022)   2. 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   Plan of Care  Problem-specific:  No problem-specific Assessment & Plan notes found for this encounter.  Angela Espinoza a current medication list which includes the following long-term medication(s): duloxetine, levocetirizine, and metoprolol tartrate.  Pharmacotherapy (Medications Ordered): No orders of the defined types were placed in this encounter.  Orders:  No orders of the defined types were placed in this encounter.  Follow-up plan:   Return if symptoms worsen or fail to improve.     Interventional Therapies  Risk  Complexity Considerations:   WNL   Planned  Pending:   Therapeutic left lumbar facet MBB (L2-S1) #4 (12/26/2021)    Under consideration:   Diagnostic lumbar MRI  Diagnostic left hip MRI  Diagnostic sacral MRI  Therapeutic left lumbar (L4-S1) MB RFA #2    Completed:   Diagnostic left ischial bursa inj. x1 (11/23/2021) (9 to  0/10) (30/70/0/0)  Diagnostic left thoracolumbar facet MB (T12-L3) Blk x2 (07/24/2018) (100/100/100/90-100)  Therapeutic left lumbar facet MB (L2-S1) Blk x3 (12/27/2020) (100/100/80/100)  Diagnostic left lumbar facet MB (L4-S1) Blk x1 (12/27/2020) (100/100/80/100) until 6/23. Therapeutic left thoracolumbar MB (T12-L3) RFA x2 (04/26/2020) (100/100/80/80)  Therapeutic left lumbar MB (L4-S1) RFA x1 (07/28/2019) (100/100/100/100)    Completed by other providers:   Therapeutic left lumbar facet (L4, L5) MB RFA x1 (10/04/2017) by Windell Norfolk, MD [facility])  Therapeutic right L4-5, L5-S1 facet joint RFA x1 (10/17/2016) by Windell Norfolk, MD [facility])  Note: According to the description of this procedure, radiofrequency was applied to the RIGHT L4 and the L5 medial branch at the junction of the superior articular process and the transverse process of the L4-5 facet joint and at the base of the right sacral ala for the L5 medial branch. Diagnostic left lumbar facet (L3, L5, SA*) MB RFA x1 (04/11/2016) by Windell Norfolk, MD [facility])  Diagnostic left lumbar facet (L4, L5, SA*) MBB x2 (02/22/2016, 03/20/2016) by Windell Norfolk, MD [facility])  Diagnostic bilateral L4-5, L5-S1 IA facet joint inj. x1 (12/30/2015) by Windell Norfolk, MD [facility])  * (SA = Sacral Ala)    Therapeutic  Palliative (PRN) options:   Palliative left thoracolumbar (T12, L1, L2, & L3) MB RFA   Palliative left lumbar (L4, L5, S1) MB RFA       Recent Visits Date Type Provider Dept  01/11/22 Office Visit Delano Metz, MD [facility]  12/26/21 Procedure visit Delano Metz, MD [facility]  12/19/21 Office Visit Delano Metz, MD [facility]  11/23/21 Procedure visit Delano Metz, MD [facility]  Showing recent visits within past 90 days and meeting all other requirements Today's Visits Date Type Provider Dept   02/14/22 Office Visit Delano Metz, MD [facility]  Showing today's visits and meeting all other requirements Future Appointments No visits were found meeting these conditions. Showing future appointments within next 90 days and  meeting all other requirements  I discussed the assessment and treatment plan with the patient. The patient was provided an opportunity to ask questions and all were answered. The patient agreed with the plan and demonstrated an understanding of the instructions.  Patient advised to call back or seek an in-person evaluation if the symptoms or condition worsens.  Duration of encounter: 35 minutes.  Total time on encounter, as per AMA guidelines included both the face-to-face and non-face-to-face time personally spent by the physician and/or other qualified health care professional(s) on the day of the encounter (includes time in activities that require the physician or other qualified health care professional and does not include time in activities normally performed by clinical staff). Physician's time may include the following activities when performed: Preparing to see the patient (e.g., pre-charting review of records, searching for previously ordered imaging, lab work, and nerve conduction tests) Review of prior analgesic pharmacotherapies. Reviewing PMP Interpreting ordered tests (e.g., lab work, imaging, nerve conduction tests) Performing post-procedure evaluations, including interpretation of diagnostic procedures Obtaining and/or reviewing separately obtained history Performing a medically appropriate examination and/or evaluation Counseling and educating the patient/family/caregiver Ordering medications, tests, or procedures Referring and communicating with other health care professionals (when not separately reported) Documenting clinical information in the electronic or other health record Independently interpreting results (not separately  reported) and communicating results to the patient/ family/caregiver Care coordination (not separately reported)  Note by: Gaspar Cola, MD Date: 02/14/2022; Time: 10:59 AM

## 2022-02-13 NOTE — Progress Notes (Signed)
Subjective:  I connected with  Angela Espinoza on 02/13/22 by a audio enabled telemedicine application and verified that I am speaking with the correct person using two identifiers.  Patient Location: Home  Provider Location: Office/Clinic  I discussed the limitations of evaluation and management by telemedicine. The patient expressed understanding and agreed to proceed.  Angela Espinoza is a 67 y.o. female who presents for Medicare Annual (Subsequent) preventive examination.  Review of Systems    Defer to PCP       Objective:    Today's Vitals   02/13/22 0950  Weight: 115 lb (52.2 kg)  Height: 5\' 2"  (1.575 m)  PainSc: 6   PainLoc: Leg   Body mass index is 21.03 kg/m.     01/11/2022   12:56 PM 11/23/2021    9:31 AM 11/07/2021    7:37 AM 10/31/2021    1:15 PM 10/24/2021   11:38 AM 08/24/2021   11:03 AM 06/06/2021   11:41 AM  Advanced Directives  Does Patient Have a Medical Advance Directive? No No No No No No No  Would patient like information on creating a medical advance directive?  No - Patient declined No - Patient declined  Yes (MAU/Ambulatory/Procedural Areas - Information given) No - Patient declined No - Patient declined    Current Medications (verified) Outpatient Encounter Medications as of 02/13/2022  Medication Sig   Black Currant Seed Oil 500 MG CAPS Take by mouth.   BLACK ELDERBERRY PO Take 1 capsule by mouth daily.    Black Pepper-Turmeric 3-500 MG CAPS Take by mouth.   cholecalciferol (VITAMIN D3) 25 MCG (1000 UNIT) tablet Take 1,000 Units by mouth daily.   co-enzyme Q-10 30 MG capsule Take 30 mg by mouth daily.   DULoxetine (CYMBALTA) 30 MG capsule Take 1 capsule (30 mg total) by mouth daily.   famotidine (PEPCID) 20 MG tablet Take by mouth daily as needed.   GARLIC PO Take by mouth.   levocetirizine (XYZAL) 5 MG tablet TAKE 1 TABLET BY MOUTH EVERY DAY IN THE EVENING   metoprolol tartrate (LOPRESSOR) 25 MG tablet TAKE 0.5 TABLETS (12.5  MG) BY MOUTH 2 TIMES DAILY.   Multiple Vitamin (MULTIVITAMIN) capsule Take 1 capsule by mouth daily.   Omega-3 Fatty Acids (FISH OIL PO) Take 1 tablet by mouth daily.   SODIUM FLUORIDE 5000 PPM 1.1 % PSTE Take by mouth 2 (two) times daily.   vitamin C (ASCORBIC ACID) 500 MG tablet Take 1,000 mg by mouth daily.    lidocaine (LIDODERM) 5 % Place 1 patch onto the skin daily. (Patient not taking: Reported on 02/13/2022)   No facility-administered encounter medications on file as of 02/13/2022.    Allergies (verified) Mobic [meloxicam] and Indapamide   History: Past Medical History:  Diagnosis Date   Anxiety    Asthma    NO INHALER-SMOKE AND PERFUME TRIGGERS ASTHMA   Depression    GERD (gastroesophageal reflux disease)    Hyperlipidemia    Hypertension    IBS (irritable bowel syndrome)    Past Surgical History:  Procedure Laterality Date   ABDOMINAL HYSTERECTOMY  age 78   AXILLARY LYMPH NODE BIOPSY Right 08/03/2014   Procedure: EXCISION RIGHT  AXILLARY LIPOMATOSIS;  Surgeon: Leonie Green, MD;  Location: [facility];  Service: General;  Laterality: Right;   BREAST BIOPSY Left 10/07/2018   Stereo, Coil Clip, BENIGN BREAST TISSUE WITH AREAS CONTAINING A PREDOMINANCE OF ADIPOSE TISSUE   BREAST BIOPSY Left 10/21/2019   benign  CATARACT EXTRACTION W/PHACO Left 10/24/2021   Procedure: CATARACT EXTRACTION PHACO AND INTRAOCULAR LENS PLACEMENT (IOC) LEFT VIVITY TORIC LENS 7.91 00:41.6;  Surgeon: Birder Robson, MD;  Location: [place];  Service: Ophthalmology;  Laterality: Left;   CATARACT EXTRACTION W/PHACO Right 11/07/2021   Procedure: CATARACT EXTRACTION PHACO AND INTRAOCULAR LENS PLACEMENT ([place]) RIGHT VIVITY TORIC  LENS;  Surgeon: Birder Robson, MD;  Location: [place];  Service: Ophthalmology;  Laterality: Right;  6.89 0:43.9   COLONOSCOPY     DIAGNOSTIC LAPAROSCOPY     ESOPHAGOGASTRODUODENOSCOPY (EGD) WITH PROPOFOL N/A 01/26/2021   Procedure:  ESOPHAGOGASTRODUODENOSCOPY (EGD) WITH PROPOFOL;  Surgeon: Lesly Rubenstein, MD;  Location: [facility];  Service: Endoscopy;  Laterality: N/A;   ESOPHAGOGASTRODUODENOSCOPY (EGD) WITH PROPOFOL N/A 04/17/2021   Procedure: ESOPHAGOGASTRODUODENOSCOPY (EGD) WITH PROPOFOL;  Surgeon: Lesly Rubenstein, MD;  Location: [facility];  Service: Endoscopy;  Laterality: N/A;   ESOPHAGOGASTRODUODENOSCOPY (EGD) WITH PROPOFOL N/A 05/02/2021   Procedure: ESOPHAGOGASTRODUODENOSCOPY (EGD) WITH PROPOFOL;  Surgeon: Lesly Rubenstein, MD;  Location: [facility];  Service: Endoscopy;  Laterality: N/A;   ESOPHAGOGASTRODUODENOSCOPY (EGD) WITH PROPOFOL N/A 06/06/2021   Procedure: ESOPHAGOGASTRODUODENOSCOPY (EGD) WITH PROPOFOL;  Surgeon: Lesly Rubenstein, MD;  Location: [facility];  Service: Endoscopy;  Laterality: N/A;   EXPLORATORY LAPAROTOMY     FOOT SURGERY     KYPHOPLASTY N/A 12/10/2014   Procedure: KYPHOPLASTY L 1;  Surgeon: Hessie Knows, MD;  Location: [facility];  Service: Orthopedics;  Laterality: N/A;   OOPHORECTOMY     TONSILLECTOMY     Family History  Problem Relation Age of Onset   Heart disease Mother    Heart attack Mother    Cancer Father    Lung cancer Father    Breast cancer Neg Hx    Social History   Socioeconomic History   Marital status: Married    Spouse name: Not on file   Number of children: Not on file   Years of education: Not on file   Highest education level: Not on file  Occupational History   Not on file  Tobacco Use   Smoking status: Never    Passive exposure: Yes   Smokeless tobacco: Never   Tobacco comments:    Parents were smokers   Vaping Use   Vaping Use: Never used  Substance and Sexual Activity   Alcohol use: Not Currently   Drug use: No   Sexual activity: Not on file  Other Topics Concern   Not on file  Social History Narrative   Not on file   Social Determinants of Health   Financial Resource Strain: Low Risk  (02/13/2022)   Overall  Financial Resource Strain (CARDIA)    Difficulty of Paying Living Expenses: Not hard at all  Food Insecurity: No Food Insecurity (02/13/2022)   Hunger Vital Sign    Worried About Running Out of Food in the Last Year: Never true    [place] in the Last Year: Never true  Transportation Needs: No Transportation Needs (02/13/2022)   PRAPARE - Hydrologist (Medical): No    Lack of Transportation (Non-Medical): No  Physical Activity: Inactive (02/13/2022)   Exercise Vital Sign    Days of Exercise per Week: 0 days    Minutes of Exercise per Session: 0 min  Stress: No Stress Concern Present (02/13/2022)   [place]    Feeling of Stress : Not at all  Social  Connections: Moderately Integrated (02/13/2022)   Social Connection and Isolation Panel [NHANES]    Frequency of Communication with Friends and Family: More than three times a week    Frequency of Social Gatherings with Friends and Family: Once a week    Attends Religious Services: 1 to 4 times per year    Active Member of [employer] or Organizations: No    Attends Archivist Meetings: Never    Marital Status: Married    Tobacco Counseling Counseling given: Not Answered Tobacco comments: Parents were smokers    Clinical Intake:     Pain Score: 6            Diabetic?No         Activities of Daily Living    01/08/2022    9:55 AM 01/04/2022   10:45 AM  In your present state of health, do you have any difficulty performing the following activities:  Hearing? 0 0  Vision? 0 0  Difficulty concentrating or making decisions? 0 0  Walking or climbing stairs? 0 0  Dressing or bathing? 0 0  Doing errands, shopping? 0 0    Patient Care Team: Bo Merino, FNP as PCP - General (Nurse Practitioner) Rico Junker, RN as Registered Nurse Theodore Demark, RN (Inactive) as Registered Nurse  Indicate any recent  Medical Services you may have received from other than [facility] providers in the past year (date may be approximate).     Assessment:   This is a routine wellness examination for [place].  Hearing/Vision screen No results found.  Dietary issues and exercise activities discussed:     Goals Addressed   None   Depression Screen    02/13/2022   10:01 AM 01/11/2022   12:55 PM 01/08/2022    9:55 AM 01/04/2022   10:45 AM 12/04/2021   12:59 PM 11/23/2021    9:31 AM 10/31/2021    1:15 PM  PHQ 2/9 Scores  PHQ - 2 Score 0 0 0 0 0 0 0  PHQ- 9 Score     0      Fall Risk    01/11/2022   12:55 PM 01/08/2022    9:54 AM 01/04/2022   10:45 AM 12/26/2021   10:35 AM 12/19/2021    2:46 PM  [place] in the past year? 0 0 0 0 0  Number falls in past yr:  0 0 0   Injury with Fall?  0 0    Risk for fall due to :    No Fall Risks   Follow up  Falls evaluation completed Falls evaluation completed Falls evaluation completed     [place]:  Any stairs in or around the home? No  If so, are there any without handrails? No  Home free of loose throw rugs in walkways, pet beds, electrical cords, etc? Yes  Adequate lighting in your home to reduce risk of falls? Yes   ASSISTIVE DEVICES UTILIZED TO PREVENT FALLS:  Life alert? No  Use of a cane, walker or w/c? No  Grab bars in the bathroom? No  Shower chair or bench in shower? Yes  Elevated toilet seat or a handicapped toilet? Yes   TIMED UP AND GO:  Was the test performed? No .  Length of time to ambulate 10 feet: n/a sec.     Cognitive Function:        Immunizations Immunization History  Administered Date(s) Administered  Influenza Split 10/25/2014   Influenza-Unspecified 11/08/2020   Moderna SARS-COV2 Booster Vaccination 03/20/2019, 12/07/2019, 11/08/2020   Moderna Sars-Covid-2 Vaccination 02/19/2019   Pneumococcal Polysaccharide-23 09/12/2018   Td 04/10/2010    TDAP status: Due,  Education has been provided regarding the importance of this vaccine. Advised may receive this vaccine at local pharmacy or Health Dept. Aware to provide a copy of the vaccination record if obtained from local pharmacy or Health Dept. Verbalized acceptance and understanding.  Flu Vaccine status: Due, Education has been provided regarding the importance of this vaccine. Advised may receive this vaccine at local pharmacy or Health Dept. Aware to provide a copy of the vaccination record if obtained from local pharmacy or Health Dept. Verbalized acceptance and understanding.  Pneumococcal vaccine status: Due, Education has been provided regarding the importance of this vaccine. Advised may receive this vaccine at local pharmacy or Health Dept. Aware to provide a copy of the vaccination record if obtained from local pharmacy or Health Dept. Verbalized acceptance and understanding.  Covid-19 vaccine status: Completed vaccines  Qualifies for Shingles Vaccine? Yes   Zostavax completed No   Shingrix Completed?: No.    Education has been provided regarding the importance of this vaccine. Patient has been advised to call insurance company to determine out of pocket expense if they have not yet received this vaccine. Advised may also receive vaccine at local pharmacy or Health Dept. Verbalized acceptance and understanding.  Screening Tests Health Maintenance  Topic Date Due   COVID-19 Vaccine (2 - Moderna risk series) 12/06/2020   Zoster Vaccines- Shingrix (1 of 2) 03/20/2022 (Originally 03/19/1974)   INFLUENZA VACCINE  04/29/2022 (Originally 08/29/2021)   Hepatitis C Screening  06/16/2022 (Originally 03/19/1973)   Pneumonia Vaccine 86+ Years old (2 - PCV) 12/19/2022 (Originally 09/12/2019)   Medicare Annual Wellness (AWV)  02/14/2023   MAMMOGRAM  12/12/2023   COLONOSCOPY (Pts 45-80yrs Insurance coverage will need to be confirmed)  01/13/2024   DEXA SCAN  Completed   HPV VACCINES  Aged Out   DTaP/Tdap/Td   Discontinued    Health Maintenance  Health Maintenance Due  Topic Date Due   COVID-19 Vaccine (2 - Moderna risk series) 12/06/2020    Colorectal cancer screening: Type of screening: Colonoscopy. Completed 01/12/2014. Repeat every 10 years  Mammogram status: Completed 12/11/2021. Repeat every year  Bone Density status: Ordered 02/13/2022. Pt provided with contact info and advised to call to schedule appt.  Lung Cancer Screening: (Low Dose CT Chest recommended if Age 1-80 years, 30 pack-year currently smoking OR have quit w/in 15years.) does not qualify.   Lung Cancer Screening Referral: n/a  Additional Screening:  Hepatitis C Screening: does qualify; Completed n/a  Vision Screening: Recommended annual ophthalmology exams for early detection of glaucoma and other disorders of the eye. Is the patient up to date with their annual eye exam?  Yes  Who is the provider or what is the name of the office in which the patient attends annual eye exams? [facility] If pt is not established with a provider, would they like to be referred to a provider to establish care? No .   Dental Screening: Recommended annual dental exams for proper oral hygiene  Community Resource Referral / Chronic Care Management: CRR required this visit?  No   CCM required this visit?  No      Plan:     I have personally reviewed and noted the following in the patient's chart:   Medical and social history Use  of alcohol, tobacco or illicit drugs  Current medications and supplements including opioid prescriptions. Patient is not currently taking opioid prescriptions. Functional ability and status Nutritional status Physical activity Advanced directives List of other physicians Hospitalizations, surgeries, and ER visits in previous 12 months Vitals Screenings to include cognitive, depression, and falls Referrals and appointments  In addition, I have reviewed and discussed with patient certain  preventive protocols, quality metrics, and best practice recommendations. A written personalized care plan for preventive services as well as general preventive health recommendations were provided to patient.     Royal Hawthorn, [place]   02/13/2022   Nurse Notes:  Ms. Selbe , Thank you for taking time to come for your Medicare Wellness Visit. I appreciate your ongoing commitment to your health goals. Please review the following plan we discussed and let me know if I can assist you in the future.   These are the goals we discussed:  Goals   None     This is a list of the screening recommended for you and due dates:  Health Maintenance  Topic Date Due   COVID-19 Vaccine (2 - Moderna risk series) 12/06/2020   Zoster (Shingles) Vaccine (1 of 2) 03/20/2022*   Flu Shot  04/29/2022*   Hepatitis C Screening: USPSTF Recommendation to screen - Ages 18-79 yo.  06/16/2022*   Pneumonia Vaccine (2 - PCV) 12/19/2022*   Medicare Annual Wellness Visit  02/14/2023   Mammogram  12/12/2023   Colon Cancer Screening  01/13/2024   DEXA scan (bone density measurement)  Completed   HPV Vaccine  Aged Out   DTaP/Tdap/Td vaccine  Discontinued  *Topic was postponed. The date shown is not the original due date.

## 2022-02-14 ENCOUNTER — Ambulatory Visit: Payer: Medicare HMO | Attending: Pain Medicine | Admitting: Pain Medicine

## 2022-02-14 ENCOUNTER — Encounter: Payer: Self-pay | Admitting: Pain Medicine

## 2022-02-14 VITALS — BP 131/71 | HR 70 | Temp 97.2°F | Ht 62.0 in | Wt 115.0 lb

## 2022-02-14 DIAGNOSIS — R937 Abnormal findings on diagnostic imaging of other parts of musculoskeletal system: Secondary | ICD-10-CM | POA: Diagnosis not present

## 2022-02-14 DIAGNOSIS — S32010S Wedge compression fracture of first lumbar vertebra, sequela: Secondary | ICD-10-CM | POA: Diagnosis not present

## 2022-02-14 DIAGNOSIS — M545 Low back pain, unspecified: Secondary | ICD-10-CM | POA: Diagnosis not present

## 2022-02-14 NOTE — Progress Notes (Signed)
Safety precautions to be maintained throughout the outpatient stay will include: orient to surroundings, keep bed in low position, maintain call bell within reach at all times, provide assistance with transfer out of bed and ambulation.  

## 2022-02-15 DIAGNOSIS — M25552 Pain in left hip: Secondary | ICD-10-CM | POA: Diagnosis not present

## 2022-02-15 DIAGNOSIS — M6281 Muscle weakness (generalized): Secondary | ICD-10-CM | POA: Diagnosis not present

## 2022-02-15 DIAGNOSIS — M545 Low back pain, unspecified: Secondary | ICD-10-CM | POA: Diagnosis not present

## 2022-02-19 ENCOUNTER — Ambulatory Visit: Payer: Medicare HMO | Admitting: Pain Medicine

## 2022-02-19 ENCOUNTER — Telehealth: Payer: Self-pay | Admitting: Nurse Practitioner

## 2022-02-19 DIAGNOSIS — M545 Low back pain, unspecified: Secondary | ICD-10-CM | POA: Diagnosis not present

## 2022-02-19 DIAGNOSIS — M6281 Muscle weakness (generalized): Secondary | ICD-10-CM | POA: Diagnosis not present

## 2022-02-19 DIAGNOSIS — M25552 Pain in left hip: Secondary | ICD-10-CM | POA: Diagnosis not present

## 2022-02-19 NOTE — Telephone Encounter (Signed)
Copied from [place] [id number]. Topic: General - Other >> Feb 19, 2022  1:13 PM Chapman Fitch wrote: Reason for CRM: Pt asked if she needs a bone density scheduled again / pt also mentioned Frankincense oil and collagen and asked if this is ok to use for her back pain and collagen for her bones / please advise

## 2022-02-20 NOTE — Telephone Encounter (Signed)
FYI

## 2022-02-21 NOTE — Telephone Encounter (Signed)
Pt called back and was given the number for her DEXA, she also wants feedback on her other question about managing her back pain with frankincense and collagen . Please advise with any suggestions

## 2022-02-21 NOTE — Telephone Encounter (Signed)
Patient notified

## 2022-02-22 DIAGNOSIS — M545 Low back pain, unspecified: Secondary | ICD-10-CM | POA: Diagnosis not present

## 2022-02-22 DIAGNOSIS — M6281 Muscle weakness (generalized): Secondary | ICD-10-CM | POA: Diagnosis not present

## 2022-02-22 DIAGNOSIS — M25552 Pain in left hip: Secondary | ICD-10-CM | POA: Diagnosis not present

## 2022-02-28 DIAGNOSIS — M545 Low back pain, unspecified: Secondary | ICD-10-CM | POA: Diagnosis not present

## 2022-02-28 DIAGNOSIS — M6281 Muscle weakness (generalized): Secondary | ICD-10-CM | POA: Diagnosis not present

## 2022-02-28 DIAGNOSIS — M25552 Pain in left hip: Secondary | ICD-10-CM | POA: Diagnosis not present

## 2022-03-02 DIAGNOSIS — M6281 Muscle weakness (generalized): Secondary | ICD-10-CM | POA: Diagnosis not present

## 2022-03-02 DIAGNOSIS — M25552 Pain in left hip: Secondary | ICD-10-CM | POA: Diagnosis not present

## 2022-03-02 DIAGNOSIS — M545 Low back pain, unspecified: Secondary | ICD-10-CM | POA: Diagnosis not present

## 2022-03-05 DIAGNOSIS — M25552 Pain in left hip: Secondary | ICD-10-CM | POA: Diagnosis not present

## 2022-03-05 DIAGNOSIS — M545 Low back pain, unspecified: Secondary | ICD-10-CM | POA: Diagnosis not present

## 2022-03-05 DIAGNOSIS — M6281 Muscle weakness (generalized): Secondary | ICD-10-CM | POA: Diagnosis not present

## 2022-03-07 DIAGNOSIS — M25552 Pain in left hip: Secondary | ICD-10-CM | POA: Diagnosis not present

## 2022-03-07 DIAGNOSIS — M6281 Muscle weakness (generalized): Secondary | ICD-10-CM | POA: Diagnosis not present

## 2022-03-07 DIAGNOSIS — M545 Low back pain, unspecified: Secondary | ICD-10-CM | POA: Diagnosis not present

## 2022-03-10 ENCOUNTER — Other Ambulatory Visit: Payer: Self-pay | Admitting: Nurse Practitioner

## 2022-03-10 DIAGNOSIS — J301 Allergic rhinitis due to pollen: Secondary | ICD-10-CM

## 2022-03-10 DIAGNOSIS — I10 Essential (primary) hypertension: Secondary | ICD-10-CM

## 2022-03-12 NOTE — Telephone Encounter (Signed)
Requested Prescriptions  Pending Prescriptions Disp Refills   metoprolol tartrate (LOPRESSOR) 25 MG tablet [Pharmacy Med Name: METOPROLOL TARTRATE 25 MG TAB] 90 tablet 0    Sig: TAKE 0.5 TABLETS BY MOUTH 2 TIMES DAILY.     Cardiovascular:  Beta Blockers Passed - 03/10/2022  8:31 AM      Passed - Last BP in normal range    BP Readings from Last 1 Encounters:  02/14/22 131/71         Passed - Last Heart Rate in normal range    Pulse Readings from Last 1 Encounters:  02/14/22 70         Passed - Valid encounter within last 6 months    Recent Outpatient Visits           2 months ago Left arm pain   [facility] Bo Merino, FNP   2 months ago Nocturia   [facility] Bo Merino, FNP   2 months ago Hypertension, unspecified type   [facility] Serafina Royals F, FNP   3 months ago Incontinence of feces, unspecified fecal incontinence type   [facility] Bo Merino, FNP   5 months ago Chills (without fever)   [facility] Bo Merino, FNP       Future Appointments             In 3 months Reece Packer, Myna Hidalgo, [facility], [facility]             levocetirizine (XYZAL) 5 MG tablet [Pharmacy Med Name: LEVOCETIRIZINE 5 MG TABLET] 90 tablet 0    Sig: TAKE 1 TABLET BY MOUTH EVERY DAY IN THE EVENING     Ear, Nose, and Throat:  Antihistamines - levocetirizine dihydrochloride Passed - 03/10/2022  8:31 AM      Passed - Cr in normal range and within 360 days    Creat  Date Value Ref Range Status  01/08/2022 0.67 0.50 - 1.05 mg/dL Final         Passed - eGFR is 10 or above and within 360 days    EGFR (African American)  Date Value Ref Range Status  08/12/2011 >60  Final   GFR calc Af Amer  Date Value Ref Range Status  01/01/2018 81 >59 mL/min/1.73 Final   EGFR (Non-African Amer.)  Date Value Ref Range Status   08/12/2011 >60  Final    Comment:    eGFR values <4m/min/1.73 m2 may be an indication of chronic kidney disease (CKD). Calculated eGFR is useful in patients with stable renal function. The eGFR calculation will not be reliable in acutely ill patients when serum creatinine is changing rapidly. It is not useful in  patients on dialysis. The eGFR calculation may not be applicable to patients at the low and high extremes of body sizes, pregnant women, and vegetarians.    GFR, Estimated  Date Value Ref Range Status  01/31/2021 >60 >60 mL/min Final    Comment:    (NOTE) Calculated using the CKD-EPI Creatinine Equation (2021)    eGFR  Date Value Ref Range Status  01/08/2022 96 > OR = 60 mL/min/1.715mFinal         Passed - Valid encounter within last 12 months    Recent Outpatient Visits           2 months ago Left arm pain   [facility], FNP   2 months ago Nocturia   [facility] Bo Merino, FNP   2 months ago Hypertension, unspecified type   [place], FNP   3 months ago Incontinence of feces, unspecified fecal incontinence type   [place], FNP   5 months ago Chills (without fever)   [facility] Bo Merino, FNP       Future Appointments             In 3 months Reece Packer, Myna Hidalgo, [facility], [facility]

## 2022-03-13 DIAGNOSIS — M6281 Muscle weakness (generalized): Secondary | ICD-10-CM | POA: Diagnosis not present

## 2022-03-13 DIAGNOSIS — M25552 Pain in left hip: Secondary | ICD-10-CM | POA: Diagnosis not present

## 2022-03-13 DIAGNOSIS — M545 Low back pain, unspecified: Secondary | ICD-10-CM | POA: Diagnosis not present

## 2022-03-14 DIAGNOSIS — M6281 Muscle weakness (generalized): Secondary | ICD-10-CM | POA: Diagnosis not present

## 2022-03-14 DIAGNOSIS — M25552 Pain in left hip: Secondary | ICD-10-CM | POA: Diagnosis not present

## 2022-03-14 DIAGNOSIS — M545 Low back pain, unspecified: Secondary | ICD-10-CM | POA: Diagnosis not present

## 2022-03-19 DIAGNOSIS — M25552 Pain in left hip: Secondary | ICD-10-CM | POA: Diagnosis not present

## 2022-03-19 DIAGNOSIS — M6281 Muscle weakness (generalized): Secondary | ICD-10-CM | POA: Diagnosis not present

## 2022-03-19 DIAGNOSIS — M545 Low back pain, unspecified: Secondary | ICD-10-CM | POA: Diagnosis not present

## 2022-03-21 DIAGNOSIS — M25552 Pain in left hip: Secondary | ICD-10-CM | POA: Diagnosis not present

## 2022-03-21 DIAGNOSIS — M6281 Muscle weakness (generalized): Secondary | ICD-10-CM | POA: Diagnosis not present

## 2022-03-21 DIAGNOSIS — M545 Low back pain, unspecified: Secondary | ICD-10-CM | POA: Diagnosis not present

## 2022-03-28 DIAGNOSIS — M545 Low back pain, unspecified: Secondary | ICD-10-CM | POA: Diagnosis not present

## 2022-03-28 DIAGNOSIS — M25552 Pain in left hip: Secondary | ICD-10-CM | POA: Diagnosis not present

## 2022-03-28 DIAGNOSIS — M6281 Muscle weakness (generalized): Secondary | ICD-10-CM | POA: Diagnosis not present

## 2022-03-29 DIAGNOSIS — M545 Low back pain, unspecified: Secondary | ICD-10-CM | POA: Diagnosis not present

## 2022-03-29 DIAGNOSIS — M6281 Muscle weakness (generalized): Secondary | ICD-10-CM | POA: Diagnosis not present

## 2022-03-29 DIAGNOSIS — M25552 Pain in left hip: Secondary | ICD-10-CM | POA: Diagnosis not present

## 2022-04-04 DIAGNOSIS — M6281 Muscle weakness (generalized): Secondary | ICD-10-CM | POA: Diagnosis not present

## 2022-04-04 DIAGNOSIS — M25552 Pain in left hip: Secondary | ICD-10-CM | POA: Diagnosis not present

## 2022-04-04 DIAGNOSIS — M545 Low back pain, unspecified: Secondary | ICD-10-CM | POA: Diagnosis not present

## 2022-04-10 DIAGNOSIS — M6281 Muscle weakness (generalized): Secondary | ICD-10-CM | POA: Diagnosis not present

## 2022-04-10 DIAGNOSIS — M545 Low back pain, unspecified: Secondary | ICD-10-CM | POA: Diagnosis not present

## 2022-04-10 DIAGNOSIS — M25552 Pain in left hip: Secondary | ICD-10-CM | POA: Diagnosis not present

## 2022-04-11 ENCOUNTER — Ambulatory Visit
Admission: RE | Admit: 2022-04-11 | Discharge: 2022-04-11 | Disposition: A | Discharge status: Home / Self Care | Payer: Medicare HMO | Source: Ambulatory Visit | Attending: Nurse Practitioner | Admitting: Nurse Practitioner

## 2022-04-11 DIAGNOSIS — Z13820 Encounter for screening for osteoporosis: Secondary | ICD-10-CM

## 2022-04-11 DIAGNOSIS — Z780 Asymptomatic menopausal state: Secondary | ICD-10-CM | POA: Insufficient documentation

## 2022-04-11 DIAGNOSIS — M8589 Other specified disorders of bone density and structure, multiple sites: Secondary | ICD-10-CM | POA: Insufficient documentation

## 2022-04-16 DIAGNOSIS — M6281 Muscle weakness (generalized): Secondary | ICD-10-CM | POA: Diagnosis not present

## 2022-04-16 DIAGNOSIS — M25552 Pain in left hip: Secondary | ICD-10-CM | POA: Diagnosis not present

## 2022-04-16 DIAGNOSIS — M545 Low back pain, unspecified: Secondary | ICD-10-CM | POA: Diagnosis not present

## 2022-04-24 DIAGNOSIS — M6281 Muscle weakness (generalized): Secondary | ICD-10-CM | POA: Diagnosis not present

## 2022-04-24 DIAGNOSIS — M25552 Pain in left hip: Secondary | ICD-10-CM | POA: Diagnosis not present

## 2022-04-24 DIAGNOSIS — M545 Low back pain, unspecified: Secondary | ICD-10-CM | POA: Diagnosis not present

## 2022-04-25 DIAGNOSIS — R69 Illness, unspecified: Secondary | ICD-10-CM | POA: Diagnosis not present

## 2022-04-27 DIAGNOSIS — R69 Illness, unspecified: Secondary | ICD-10-CM | POA: Diagnosis not present

## 2022-05-01 DIAGNOSIS — M545 Low back pain, unspecified: Secondary | ICD-10-CM | POA: Diagnosis not present

## 2022-05-01 DIAGNOSIS — R69 Illness, unspecified: Secondary | ICD-10-CM | POA: Diagnosis not present

## 2022-05-08 DIAGNOSIS — M545 Low back pain, unspecified: Secondary | ICD-10-CM | POA: Diagnosis not present

## 2022-05-15 DIAGNOSIS — M545 Low back pain, unspecified: Secondary | ICD-10-CM | POA: Diagnosis not present

## 2022-05-23 DIAGNOSIS — M545 Low back pain, unspecified: Secondary | ICD-10-CM | POA: Diagnosis not present

## 2022-05-28 DIAGNOSIS — Z961 Presence of intraocular lens: Secondary | ICD-10-CM | POA: Diagnosis not present

## 2022-05-28 DIAGNOSIS — H43813 Vitreous degeneration, bilateral: Secondary | ICD-10-CM | POA: Diagnosis not present

## 2022-05-29 DIAGNOSIS — M545 Low back pain, unspecified: Secondary | ICD-10-CM | POA: Diagnosis not present

## 2022-05-30 DIAGNOSIS — K279 Peptic ulcer, site unspecified, unspecified as acute or chronic, without hemorrhage or perforation: Secondary | ICD-10-CM | POA: Diagnosis not present

## 2022-06-05 DIAGNOSIS — M545 Low back pain, unspecified: Secondary | ICD-10-CM | POA: Diagnosis not present

## 2022-06-07 ENCOUNTER — Other Ambulatory Visit: Payer: Self-pay | Admitting: Nurse Practitioner

## 2022-06-07 DIAGNOSIS — J301 Allergic rhinitis due to pollen: Secondary | ICD-10-CM

## 2022-06-07 DIAGNOSIS — I10 Essential (primary) hypertension: Secondary | ICD-10-CM

## 2022-06-07 NOTE — Telephone Encounter (Signed)
Requested Prescriptions  Pending Prescriptions Disp Refills   levocetirizine (XYZAL) 5 MG tablet [Pharmacy Med Name: LEVOCETIRIZINE 5 MG TABLET] 90 tablet 0    Sig: TAKE 1 TABLET BY MOUTH EVERY DAY IN THE EVENING     Ear, Nose, and Throat:  Antihistamines - levocetirizine dihydrochloride Passed - 06/07/2022  2:37 AM      Passed - Cr in normal range and within 360 days    Creat  Date Value Ref Range Status  01/08/2022 0.67 0.50 - 1.05 mg/dL Final         Passed - eGFR is 10 or above and within 360 days    EGFR (African American)  Date Value Ref Range Status  08/12/2011 >60  Final   GFR calc Af Amer  Date Value Ref Range Status  01/01/2018 81 >59 mL/min/1.73 Final   EGFR (Non-African Amer.)  Date Value Ref Range Status  08/12/2011 >60  Final    Comment:    eGFR values <22mL/min/1.73 m2 may be an indication of chronic kidney disease (CKD). Calculated eGFR is useful in patients with stable renal function. The eGFR calculation will not be reliable in acutely ill patients when serum creatinine is changing rapidly. It is not useful in  patients on dialysis. The eGFR calculation may not be applicable to patients at the low and high extremes of body sizes, pregnant women, and vegetarians.    GFR, Estimated  Date Value Ref Range Status  01/31/2021 >60 >60 mL/min Final    Comment:    (NOTE) Calculated using the CKD-EPI Creatinine Equation (2021)    eGFR  Date Value Ref Range Status  01/08/2022 96 > OR = 60 mL/min/1.51m2 Final         Passed - Valid encounter within last 12 months    Recent Outpatient Visits           5 months ago Left arm pain   [facility] Berniece Salines, FNP   5 months ago Nocturia   [facility] Berniece Salines, FNP   5 months ago Hypertension, unspecified type   [facility] Della Goo F, FNP   6 months ago Incontinence of feces, unspecified fecal incontinence type    [facility] Berniece Salines, FNP   8 months ago Chills (without fever)   [facility] Berniece Salines, FNP       Future Appointments             In 1 week Zane Herald, Rudolpho Sevin, FNP [facility], [facility]             metoprolol tartrate (LOPRESSOR) 25 MG tablet [Pharmacy Med Name: METOPROLOL TARTRATE 25 MG TAB] 90 tablet 0    Sig: TAKE 1/2 TABLET TWICE A DAY BY MOUTH     Cardiovascular:  Beta Blockers Passed - 06/07/2022  2:37 AM      Passed - Last BP in normal range    BP Readings from Last 1 Encounters:  02/14/22 131/71         Passed - Last Heart Rate in normal range    Pulse Readings from Last 1 Encounters:  02/14/22 70         Passed - Valid encounter within last 6 months    Recent Outpatient Visits           5 months ago Left arm pain    [facility] Berniece Salines, FNP   5 months ago Nocturia   [facility] Berniece Salines, FNP   5 months ago Hypertension, unspecified type   [facility] Berniece Salines, FNP   6 months ago Incontinence of feces, unspecified fecal incontinence type   [facility] Berniece Salines, FNP   8 months ago Chills (without fever)   [facility] Berniece Salines, FNP       Future Appointments             In 1 week Zane Herald, Rudolpho Sevin, FNP [facility], [facility]

## 2022-06-14 DIAGNOSIS — M545 Low back pain, unspecified: Secondary | ICD-10-CM | POA: Diagnosis not present

## 2022-06-18 ENCOUNTER — Encounter: Payer: Self-pay | Admitting: Nurse Practitioner

## 2022-06-18 ENCOUNTER — Ambulatory Visit (INDEPENDENT_AMBULATORY_CARE_PROVIDER_SITE_OTHER): Payer: Medicare HMO | Admitting: Nurse Practitioner

## 2022-06-18 ENCOUNTER — Other Ambulatory Visit: Payer: Self-pay

## 2022-06-18 VITALS — BP 132/80 | HR 71 | Temp 97.8°F | Resp 16 | Ht 62.0 in | Wt 122.8 lb

## 2022-06-18 DIAGNOSIS — J301 Allergic rhinitis due to pollen: Secondary | ICD-10-CM

## 2022-06-18 DIAGNOSIS — K269 Duodenal ulcer, unspecified as acute or chronic, without hemorrhage or perforation: Secondary | ICD-10-CM | POA: Diagnosis not present

## 2022-06-18 DIAGNOSIS — K311 Adult hypertrophic pyloric stenosis: Secondary | ICD-10-CM | POA: Diagnosis not present

## 2022-06-18 DIAGNOSIS — G8929 Other chronic pain: Secondary | ICD-10-CM

## 2022-06-18 DIAGNOSIS — J4520 Mild intermittent asthma, uncomplicated: Secondary | ICD-10-CM | POA: Diagnosis not present

## 2022-06-18 DIAGNOSIS — I10 Essential (primary) hypertension: Secondary | ICD-10-CM

## 2022-06-18 DIAGNOSIS — M545 Low back pain, unspecified: Secondary | ICD-10-CM

## 2022-06-18 DIAGNOSIS — Z8719 Personal history of other diseases of the digestive system: Secondary | ICD-10-CM | POA: Diagnosis not present

## 2022-06-18 DIAGNOSIS — Z1159 Encounter for screening for other viral diseases: Secondary | ICD-10-CM | POA: Diagnosis not present

## 2022-06-18 DIAGNOSIS — E785 Hyperlipidemia, unspecified: Secondary | ICD-10-CM | POA: Insufficient documentation

## 2022-06-18 MED ORDER — DULOXETINE HCL 30 MG PO CPEP
30.0000 mg | ORAL_CAPSULE | Freq: Every day | ORAL | 3 refills | Status: DC
Start: 2022-06-18 — End: 2023-04-04

## 2022-06-18 NOTE — Assessment & Plan Note (Signed)
She is currently getting dry needling and physical therapy at [facility]. She says it has really helped and she is doing well.

## 2022-06-18 NOTE — Assessment & Plan Note (Signed)
Does not tolerate statins and does not want to take cholesterol lowering medication. She is working on lifestyle modification.  She is aware of the risks.

## 2022-06-18 NOTE — Assessment & Plan Note (Signed)
Patient takes Xyzal for her allergies.

## 2022-06-18 NOTE — Assessment & Plan Note (Signed)
currently takes metoprolol 12.5 mg twice daily.

## 2022-06-18 NOTE — Assessment & Plan Note (Signed)
currently takes pantoprazole 20 mg daily. Patient reports she is doing well.   

## 2022-06-18 NOTE — Assessment & Plan Note (Signed)
currently takes pantoprazole 20 mg daily. Patient reports she is doing well.

## 2022-06-18 NOTE — Assessment & Plan Note (Signed)
Patient reports she does have reactive asthma normally exacerbated by someone smoking near her or strong cologne.  She does report that she has an albuterol inhaler if she needs it.  She has not had to use it in a while.

## 2022-06-18 NOTE — Progress Notes (Signed)
BP 132/80   Pulse 71   Temp 97.8 F (36.6 C) (Oral)   Resp 16   Ht 5\' 2"  (1.575 m)   Wt 122 lb 12.8 oz (55.7 kg)   SpO2 99%   BMI 22.46 kg/m    Subjective:    Patient ID: Angela Espinoza, female    DOB: 1955/07/07, 67 y.o.   MRN: [id number]  HPI: Angela Espinoza is a 67 y.o. female  Chief Complaint  Patient presents with   Asthma   Hypertension   Hyperlipidemia    6 month follow up    Hypertension: She currently takes metoprolol 12.5 mg twice daily.  Her blood pressure today is 132/80.  She denies any headaches, shortness of breath, blurred vision or chest pain. Discussed medication compliance and working on lifestyle modification.   Asthma: Patient reports she does have reactive asthma normally exacerbated by someone smoking near her or strong cologne.  She does report that she has an albuterol inhaler if she needs it.  She has not had to use it in a while.  Duodenal ulcer/GI bleed/pyloric stenosis: Currently under the care of Dr. Mia Creek gastroenterology at [facility]  She was taking meloxicam for back pain status post a traumatic injury.  In December 2022 patient went to the emergency room in cardiac arrest.  She was diagnosed with a GI bleed.  On CT they found a large and deep ulceration at the duodenal bulb.  Last upper endoscopy was done on Jun 06, 2021.  Findings showed - Normal esophagus. - A medium amount of food (residue) in the stomach. - Acquired deformity in the pylorus. Dilated. - Normal examined duodenum. Patient reports she has had no issues since.  Patient avoids NSAIDs.  Patient denies any abdominal pain, or blood in stool. Her last GI appointment was 05/30/2022. She is currently on cymbalta which has helped. She has also been weaned down on her PPI and currently takes pantoprazole 20 mg daily. Patient reports she is doing well.    Seasonal allergies: Patient takes Xyzal for her allergies.  Patient states this works well for her.  No  changes.  Hyperlipidemia: Last LDL was 157 on 11/17/2021.  We started patient on statin medication for cholesterol.  Patient took it for short time and then decided she has not taken any more.  She reports she is going to continue to work on lifestyle modification.  The 10-year ASCVD risk score (Arnett DK, et al., 2019) is: 10.9%   Values used to calculate the score:     Age: 32 years     Sex: Female     Is Non-Hispanic African American: No     Diabetic: No     Tobacco smoker: No     Systolic Blood Pressure: 132 mmHg     Is BP treated: Yes     HDL Cholesterol: 73 mg/dL     Total Cholesterol: 251 mg/dL   Chronic back pain: She is currently taking cymbalta 30 mg daily for her chronic back pain.  Not working with pain management any more. Last seen on 02/14/2022. She is currently getting dry needling and physical therapy at [facility]. She says it has really helped and she is doing well.     06/18/2022    9:46 AM 02/13/2022   10:01 AM 01/11/2022   12:55 PM 01/08/2022    9:55 AM 01/04/2022   10:45 AM  Depression screen PHQ 2/9  Decreased Interest 0 0 0 0 0  Down, Depressed, Hopeless 0 0 0 0 0  PHQ - 2 Score 0 0 0 0 0  Altered sleeping  0     Tired, decreased energy  0     Change in appetite  0     Feeling bad or failure about yourself   0     Trouble concentrating  0     Moving slowly or fidgety/restless  0     Suicidal thoughts  0     PHQ-9 Score  0       Relevant past medical, surgical, family and social history reviewed and updated as indicated. Interim medical history since our last visit reviewed. Allergies and medications reviewed and updated.  Review of Systems  Constitutional: Negative for fever or weight change.  Respiratory: Negative for cough and shortness of breath.   Cardiovascular: Negative for chest pain or palpitations.  Gastrointestinal: Positive for abdominal pain, no bowel changes.  Musculoskeletal: Negative for gait problem or joint swelling.  Skin: Negative  for rash.  Neurological: Negative for dizziness or headache.  No other specific complaints in a complete review of systems (except as listed in HPI above).      Objective:    BP 132/80   Pulse 71   Temp 97.8 F (36.6 C) (Oral)   Resp 16   Ht 5\' 2"  (1.575 m)   Wt 122 lb 12.8 oz (55.7 kg)   SpO2 99%   BMI 22.46 kg/m   Wt Readings from Last 3 Encounters:  06/18/22 122 lb 12.8 oz (55.7 kg)  02/14/22 115 lb (52.2 kg)  02/13/22 115 lb (52.2 kg)    Physical Exam  Constitutional: Patient appears well-developed and well-nourished.  No distress.  HEENT: head atraumatic, normocephalic, pupils equal and reactive to light,  neck supple Cardiovascular: Normal rate, regular rhythm and normal heart sounds.  No murmur heard. No BLE edema. Pulmonary/Chest: Effort normal and breath sounds normal. No respiratory distress. Abdominal: Soft.  There is no tenderness. Psychiatric: Patient has a normal mood and affect. behavior is normal. Judgment and thought content normal.  Results for orders placed or performed in visit on 01/08/22  COMPLETE METABOLIC PANEL WITH GFR  Result Value Ref Range   Glucose, Bld 95 65 - 99 mg/dL   BUN 10 7 - 25 mg/dL   Creat 1.61 0.96 - 0.45 mg/dL   eGFR 96 > OR = 60 WU/JWJ/1.91Y7   BUN/Creatinine Ratio SEE NOTE: 6 - 22 (calc)   Sodium 134 (L) 135 - 146 mmol/L   Potassium 4.0 3.5 - 5.3 mmol/L   Chloride 97 (L) 98 - 110 mmol/L   CO2 29 20 - 32 mmol/L   Calcium 9.8 8.6 - 10.4 mg/dL   Total Protein 7.1 6.1 - 8.1 g/dL   Albumin 4.5 3.6 - 5.1 g/dL   Globulin 2.6 1.9 - 3.7 g/dL (calc)   AG Ratio 1.7 1.0 - 2.5 (calc)   Total Bilirubin 0.4 0.2 - 1.2 mg/dL   Alkaline phosphatase (APISO) 69 37 - 153 U/L   AST 22 10 - 35 U/L   ALT 25 6 - 29 U/L  CK (Creatine Kinase)  Result Value Ref Range   Total CK 53 29 - 143 U/L      Assessment & Plan:   Problem List Items Addressed This Visit       Cardiovascular and Mediastinum   Hypertension    currently takes metoprolol  12.5 mg twice daily.       Relevant Orders  CBC with Differential/Platelet   COMPLETE METABOLIC PANEL WITH GFR     Respiratory   Asthma without status asthmaticus    Patient reports she does have reactive asthma normally exacerbated by someone smoking near her or strong cologne.  She does report that she has an albuterol inhaler if she needs it.  She has not had to use it in a while.      Seasonal allergic rhinitis due to pollen    Patient takes Xyzal for her allergies.        Digestive   Duodenal ulcer    currently takes pantoprazole 20 mg daily. Patient reports she is doing well.        Pyloric stenosis    currently takes pantoprazole 20 mg daily. Patient reports she is doing well.          Other   History of GI bleed    currently takes pantoprazole 20 mg daily. Patient reports she is doing well.        Chronic midline low back pain without sciatica    She is currently getting dry needling and physical therapy at [facility]. She says it has really helped and she is doing well.       Relevant Medications   DULoxetine (CYMBALTA) 30 MG capsule   Hyperlipidemia - Primary    Does not tolerate statins and does not want to take cholesterol lowering medication. She is working on lifestyle modification.  She is aware of the risks.       Relevant Orders   Lipid panel   Other Visit Diagnoses     Encounter for hepatitis C screening test for low risk patient       Relevant Orders   Hepatitis C antibody        Follow up plan: Return in about 6 months (around 12/19/2022) for follow up.

## 2022-06-19 LAB — COMPLETE METABOLIC PANEL WITH GFR
AG Ratio: 2.1 (calc) (ref 1.0–2.5)
ALT: 16 U/L (ref 6–29)
AST: 22 U/L (ref 10–35)
Albumin: 4.9 g/dL (ref 3.6–5.1)
Alkaline phosphatase (APISO): 57 U/L (ref 37–153)
BUN: 13 mg/dL (ref 7–25)
CO2: 26 mmol/L (ref 20–32)
Calcium: 10.1 mg/dL (ref 8.6–10.4)
Chloride: 99 mmol/L (ref 98–110)
Creat: 0.7 mg/dL (ref 0.50–1.05)
Globulin: 2.3 g/dL (calc) (ref 1.9–3.7)
Glucose, Bld: 95 mg/dL (ref 65–99)
Potassium: 4.2 mmol/L (ref 3.5–5.3)
Sodium: 135 mmol/L (ref 135–146)
Total Bilirubin: 0.5 mg/dL (ref 0.2–1.2)
Total Protein: 7.2 g/dL (ref 6.1–8.1)
eGFR: 94 mL/min/{1.73_m2} (ref >=60)

## 2022-06-19 LAB — CBC WITH DIFFERENTIAL/PLATELET
Absolute Monocytes: 384 cells/uL (ref 200–950)
Basophils Absolute: 73 cells/uL (ref 0–200)
Basophils Relative: 1.2 %
Eosinophils Absolute: 79 cells/uL (ref 15–500)
Eosinophils Relative: 1.3 %
HCT: 41.6 % (ref 35.0–45.0)
Hemoglobin: 14 g/dL (ref 11.7–15.5)
Lymphs Abs: 1415 cells/uL (ref 850–3900)
MCH: 32.1 pg (ref 27.0–33.0)
MCHC: 33.7 g/dL (ref 32.0–36.0)
MCV: 95.4 fL (ref 80.0–100.0)
MPV: 9.8 fL (ref 7.5–12.5)
Monocytes Relative: 6.3 %
Neutro Abs: 4148 cells/uL (ref 1500–7800)
Neutrophils Relative %: 68 %
Platelets: 326 10*3/uL (ref 140–400)
RBC: 4.36 10*6/uL (ref 3.80–5.10)
RDW: 11.4 % (ref 11.0–15.0)
Total Lymphocyte: 23.2 %
WBC: 6.1 10*3/uL (ref 3.8–10.8)

## 2022-06-19 LAB — LIPID PANEL
Cholesterol: 298 mg/dL — ABNORMAL HIGH (ref <200)
HDL: 81 mg/dL (ref >=50)
LDL Cholesterol (Calc): 195 mg/dL (calc) — ABNORMAL HIGH
Non-HDL Cholesterol (Calc): 217 mg/dL (calc) — ABNORMAL HIGH (ref <130)
Total CHOL/HDL Ratio: 3.7 (calc) (ref <5.0)
Triglycerides: 97 mg/dL (ref <150)

## 2022-06-19 LAB — HEPATITIS C ANTIBODY: Hepatitis C Ab: NONREACTIVE

## 2022-06-26 DIAGNOSIS — M545 Low back pain, unspecified: Secondary | ICD-10-CM | POA: Diagnosis not present

## 2022-07-02 ENCOUNTER — Ambulatory Visit: Payer: Medicare HMO | Admitting: Podiatry

## 2022-07-03 ENCOUNTER — Ambulatory Visit: Payer: Self-pay | Admitting: *Deleted

## 2022-07-03 ENCOUNTER — Ambulatory Visit: Payer: Medicare HMO | Admitting: Podiatry

## 2022-07-03 DIAGNOSIS — Z79899 Other long term (current) drug therapy: Secondary | ICD-10-CM

## 2022-07-03 DIAGNOSIS — B351 Tinea unguium: Secondary | ICD-10-CM | POA: Diagnosis not present

## 2022-07-03 MED ORDER — TERBINAFINE HCL 250 MG PO TABS: 250.0000 mg | ORAL_TABLET | Freq: Every day | ORAL | 0 refills | Status: DC

## 2022-07-03 NOTE — Telephone Encounter (Signed)
Possible medication reaction   Per agent: "Pt is calling in because she has been experiencing headaches as well as arm pain and she doesn't know if it's related to the medication or to her cholesterol. Pt says it could be the medicine making her feel like this but she is unsure. No other symptoms."     Chief Complaint: Headache Symptoms: 3/10 headache 2-3 days. Left arm pain briefly yesterday,once, did not reoccur. States "I think it's from my high cholesterol." States she has been stressed lately, a lot to do every day and that stresses me out." States she has decided to take medication as was discussed at last OV with Raynelle Fanning. "Not the statin though." Frequency: 2-3 days Pertinent Negatives: Patient denies chest pain, SOB Disposition: [] ED /[] Urgent Care (no appt availability in office) / [] Appointment(In office/virtual)/ []  [facility]/ [] Home Care/ [] Refused Recommended Disposition /[] [place] [facility]/ [x]  Follow-up with PCP Additional Notes: Pt declined appt.  Please advise regarding medication discussed. Care advise provided. Advised ED if arm pain reoccurs, CP, symptoms worsen. Pt verbalizes understanding.  Please advise.  Reason for Disposition  [1] MODERATE headache (e.g., interferes with normal activities) AND [2] present > 24 hours AND [3] unexplained  (Exceptions: analgesics not tried, typical migraine, or headache part of viral illness)  Answer Assessment - Initial Assessment Questions 1. LOCATION: "Where does it hurt?"      Like a sinus headache 2. ONSET: "When did the headache start?" (Minutes, hours or days)      2 days ago 3. PATTERN: "Does the pain come and go, or has it been constant since it started?"      4. SEVERITY: "How bad is the pain?" and "What does it keep you from doing?"  (e.g., Scale 1-10; mild, moderate, or severe)   - MILD (1-3): doesn't interfere with normal activities    - MODERATE (4-7): interferes with normal activities or awakens from  sleep    - SEVERE (8-10): excruciating pain, unable to do any normal activities        3/10 5. RECURRENT SYMPTOM: "Have you ever had headaches before?" If Yes, ask: "When was the last time?" and "What happened that time?"       6. CAUSE: "What do you think is causing the headache?"      7. MIGRAINE: "Have you been diagnosed with migraine headaches?" If Yes, ask: "Is this headache similar?"      No 8. HEAD INJURY: "Has there been any recent injury to the head?"      No 9. OTHER SYMPTOMS: "Do you have any other symptoms?" (fever, stiff neck, eye pain, sore throat, cold symptoms)     Left arm pain, "Didn't last long."  Protocols used: Headache-A-AH

## 2022-07-03 NOTE — Progress Notes (Signed)
Subjective:  Patient ID: Angela Espinoza, female    DOB: 1955/06/01,  MRN: [id number]  Chief Complaint  Patient presents with   Nail Problem    Fungal nails     67 y.o. female presents with the above complaint.  Patient presents with thickened Mega dystrophic mycotic toenails to bilateral hallux second and third digit.  She states been present for quite some time she is tried some over-the-counter options none of which has helped she wanted discuss treatment options.  She had a recent liver function test done which was normal.   Review of Systems: Negative except as noted in the HPI. Denies N/V/F/Ch.  Past Medical History:  Diagnosis Date   Anxiety    Asthma    NO INHALER-SMOKE AND PERFUME TRIGGERS ASTHMA   Depression    GERD (gastroesophageal reflux disease)    Hyperlipidemia    Hypertension    IBS (irritable bowel syndrome)     Current Outpatient Medications:    terbinafine (LAMISIL) 250 MG tablet, Take 1 tablet (250 mg total) by mouth daily., Disp: 90 tablet, Rfl: 0   Bempedoic Acid-Ezetimibe (NEXLIZET) 180-10 MG TABS, Take 1 tablet by mouth daily., Disp: 90 tablet, Rfl: 1   Black Currant Seed Oil 500 MG CAPS, Take by mouth., Disp: , Rfl:    BLACK ELDERBERRY PO, Take 1 capsule by mouth daily. , Disp: , Rfl:    Black Pepper-Turmeric 3-500 MG CAPS, Take by mouth., Disp: , Rfl:    cholecalciferol (VITAMIN D3) 25 MCG (1000 UNIT) tablet, Take 1,000 Units by mouth daily., Disp: , Rfl:    co-enzyme Q-10 30 MG capsule, Take 30 mg by mouth daily., Disp: , Rfl:    DULoxetine (CYMBALTA) 30 MG capsule, Take 1 capsule (30 mg total) by mouth daily., Disp: 90 capsule, Rfl: 3   famotidine (PEPCID) 20 MG tablet, Take by mouth daily as needed., Disp: , Rfl:    GARLIC PO, Take by mouth., Disp: , Rfl:    levocetirizine (XYZAL) 5 MG tablet, TAKE 1 TABLET BY MOUTH EVERY DAY IN THE EVENING, Disp: 90 tablet, Rfl: 0   metoprolol tartrate (LOPRESSOR) 25 MG tablet, TAKE 1/2 TABLET TWICE A DAY  BY MOUTH, Disp: 90 tablet, Rfl: 0   Multiple Vitamin (MULTIVITAMIN) capsule, Take 1 capsule by mouth daily., Disp: , Rfl:    Omega-3 Fatty Acids (FISH OIL PO), Take 1 tablet by mouth daily., Disp: , Rfl:    pantoprazole (PROTONIX) 20 MG tablet, Take 20 mg by mouth daily., Disp: , Rfl:    vitamin C (ASCORBIC ACID) 500 MG tablet, Take 1,000 mg by mouth daily. , Disp: , Rfl:   Social History   Tobacco Use  Smoking Status Never   Passive exposure: Yes  Smokeless Tobacco Never  Tobacco Comments   Parents were smokers     Allergies  Allergen Reactions   Mobic [Meloxicam]     Bleeding Stomach ulcer   Indapamide Other (See Comments)    tingling all over body   Objective:  There were no vitals filed for this visit. There is no height or weight on file to calculate BMI. Constitutional Well developed. Well nourished.  Vascular Dorsalis pedis pulses palpable bilaterally. Posterior tibial pulses palpable bilaterally. Capillary refill normal to all digits.  No cyanosis or clubbing noted. Pedal hair growth normal.  Neurologic Normal speech. Oriented to person, place, and time. Epicritic sensation to light touch grossly present bilaterally.  Dermatologic Nails patient presents with thickened elongated dystrophic mycotic toenails x  6 bilateral hallux second and third digit Skin within normal limits  Orthopedic: Normal joint ROM without pain or crepitus bilaterally. No visible deformities. No bony tenderness.   Radiographs: None Assessment:   1. Encounter for long-term (current) use of high-risk medication   2. Nail fungus   3. Onychomycosis due to dermatophyte    Plan:  Patient was evaluated and treated and all questions answered.  Bilateral hallux second and third digit onychomycosis -Educated the patient on the etiology of onychomycosis and various treatment options associated with improving the fungal load.  I explained to the patient that there is 3 treatment options available  to treat the onychomycosis including topical, p.o., laser treatment.  Patient elected to undergo p.o. options with Lamisil/terbinafine therapy.  In order for me to start the medication therapy, I explained to the patient the importance of evaluating the liver and obtaining the liver function test.  Once the liver function test comes back normal I will start him on 26-month course of Lamisil therapy.  Patient understood all risk and would like to proceed with Lamisil therapy.  I have asked the patient to immediately stop the Lamisil therapy if she has any reactions to it and call the office or go to the emergency room right away.  Patient states understanding -Patient liver function test is within normal limits Lamisil was sent to the pharmacy   No follow-ups on file.

## 2022-07-04 ENCOUNTER — Other Ambulatory Visit: Payer: Self-pay | Admitting: Nurse Practitioner

## 2022-07-04 DIAGNOSIS — I2510 Atherosclerotic heart disease of native coronary artery without angina pectoris: Secondary | ICD-10-CM

## 2022-07-04 DIAGNOSIS — E785 Hyperlipidemia, unspecified: Secondary | ICD-10-CM

## 2022-07-04 MED ORDER — NEXLIZET 180-10 MG PO TABS
1.0000 | ORAL_TABLET | Freq: Every day | ORAL | 1 refills | Status: DC
Start: 2022-07-04 — End: 2023-01-08

## 2022-07-05 DIAGNOSIS — M545 Low back pain, unspecified: Secondary | ICD-10-CM | POA: Diagnosis not present

## 2022-07-19 DIAGNOSIS — M545 Low back pain, unspecified: Secondary | ICD-10-CM | POA: Diagnosis not present

## 2022-07-31 ENCOUNTER — Other Ambulatory Visit: Payer: Self-pay | Admitting: Nurse Practitioner

## 2022-07-31 DIAGNOSIS — J301 Allergic rhinitis due to pollen: Secondary | ICD-10-CM

## 2022-08-01 ENCOUNTER — Telehealth: Payer: Self-pay | Admitting: Nurse Practitioner

## 2022-08-01 DIAGNOSIS — M545 Low back pain, unspecified: Secondary | ICD-10-CM | POA: Diagnosis not present

## 2022-08-01 NOTE — Telephone Encounter (Signed)
Copied from CRM [id number]. Topic: General - Other >> Aug 01, 2022  3:23 PM Dondra Prader E wrote: Reason for CRM: Pt called and wants to know why her recent request for medication was denied? Please advise.  levocetirizine (XYZAL) 5 MG tablet

## 2022-08-01 NOTE — Telephone Encounter (Signed)
Refilled 06/07/22 # 90. Requested Prescriptions  Refused Prescriptions Disp Refills   levocetirizine (XYZAL) 5 MG tablet [Pharmacy Med Name: LEVOCETIRIZINE 5 MG TABLET] 90 tablet 0    Sig: TAKE 1 TABLET BY MOUTH EVERY DAY IN THE EVENING     Ear, Nose, and Throat:  Antihistamines - levocetirizine dihydrochloride Passed - 07/31/2022  4:40 PM      Passed - Cr in normal range and within 360 days    Creat  Date Value Ref Range Status  06/18/2022 0.70 0.50 - 1.05 mg/dL Final         Passed - eGFR is 10 or above and within 360 days    EGFR (African American)  Date Value Ref Range Status  08/12/2011 >60  Final   GFR calc Af Amer  Date Value Ref Range Status  01/01/2018 81 >59 mL/min/1.73 Final   EGFR (Non-African Amer.)  Date Value Ref Range Status  08/12/2011 >60  Final    Comment:    eGFR values <85mL/min/1.73 m2 may be an indication of chronic kidney disease (CKD). Calculated eGFR is useful in patients with stable renal function. The eGFR calculation will not be reliable in acutely ill patients when serum creatinine is changing rapidly. It is not useful in  patients on dialysis. The eGFR calculation may not be applicable to patients at the low and high extremes of body sizes, pregnant women, and vegetarians.    GFR, Estimated  Date Value Ref Range Status  01/31/2021 >60 >60 mL/min Final    Comment:    (NOTE) Calculated using the CKD-EPI Creatinine Equation (2021)    eGFR  Date Value Ref Range Status  06/18/2022 94 > OR = 60 mL/min/1.23m2 Final         Passed - Valid encounter within last 12 months    Recent Outpatient Visits           1 month ago Hyperlipidemia, unspecified hyperlipidemia type   [facility] Berniece Salines, FNP   6 months ago Left arm pain   [facility] Berniece Salines, FNP   6 months ago Nocturia   [facility] Berniece Salines, FNP   7 months ago Hypertension,  unspecified type   [facility] Berniece Salines, FNP   8 months ago Incontinence of feces, unspecified fecal incontinence type   [facility] Berniece Salines, FNP       Future Appointments             In 4 months Zane Herald, Rudolpho Sevin, FNP [facility], [facility]

## 2022-08-03 ENCOUNTER — Other Ambulatory Visit: Payer: Self-pay | Admitting: Nurse Practitioner

## 2022-08-03 DIAGNOSIS — J301 Allergic rhinitis due to pollen: Secondary | ICD-10-CM

## 2022-08-03 MED ORDER — LEVOCETIRIZINE DIHYDROCHLORIDE 5 MG PO TABS: 5.0000 mg | ORAL_TABLET | Freq: Every evening | ORAL | 3 refills | Status: DC

## 2022-08-08 ENCOUNTER — Telehealth: Payer: Self-pay | Admitting: Nurse Practitioner

## 2022-08-08 NOTE — Telephone Encounter (Signed)
Sent on 08/03/2022

## 2022-08-08 NOTE — Telephone Encounter (Signed)
Medication Refill - Medication: levocetirizine (XYZAL) 5 MG tablet [[id number]]   Has the patient contacted their pharmacy? Yes.      Preferred Pharmacy (with phone number or street name):  [id number] [address], [place] [address] Phone: [phone]  Fax: [phone]      Has the patient been seen for an appointment in the last year OR does the patient have an upcoming appointment? Yes.    Agent: Please be advised that RX refills may take up to 3 business days. We ask that you follow-up with your pharmacy.

## 2022-08-09 NOTE — Progress Notes (Signed)
BP 110/72   Pulse 69   Temp 98.6 F (37 C) (Oral)   Resp 18   Ht 5\' 2"  (1.575 m)   Wt 124 lb 8 oz (56.5 kg)   SpO2 97%   BMI 22.77 kg/m    Subjective:    Patient ID: Angela Espinoza, female    DOB: 1955-12-26, 67 y.o.   MRN: [id number]  HPI: Angela Espinoza is a 67 y.o. female  Chief Complaint  Patient presents with   Rash    Under breast   Cherry hemangiomas: patient here today for concern of rash near her breast, upon exam noted no rash, she does a a couple hemangiomas.  Discussed that these are benign and reassurance provided.   Medication management: patient is currently on nexilzet for her cholesterol and lamisil for her onychomycosis. She would like to have her liver enzymes checked.  Lab ordered.   Relevant past medical, surgical, family and social history reviewed and updated as indicated. Interim medical history since our last visit reviewed. Allergies and medications reviewed and updated.  Review of Systems  Constitutional: Negative for fever or weight change.  Respiratory: Negative for cough and shortness of breath.   Cardiovascular: Negative for chest pain or palpitations.  Gastrointestinal: Negative for abdominal pain, no bowel changes.  Musculoskeletal: Negative for gait problem or joint swelling.  Skin: Negative for rash.  Neurological: Negative for dizziness or headache.  No other specific complaints in a complete review of systems (except as listed in HPI above).      Objective:    BP 110/72   Pulse 69   Temp 98.6 F (37 C) (Oral)   Resp 18   Ht 5\' 2"  (1.575 m)   Wt 124 lb 8 oz (56.5 kg)   SpO2 97%   BMI 22.77 kg/m   Wt Readings from Last 3 Encounters:  08/10/22 124 lb 8 oz (56.5 kg)  06/18/22 122 lb 12.8 oz (55.7 kg)  02/14/22 115 lb (52.2 kg)    Physical Exam  Constitutional: Patient appears well-developed and well-nourished.  No distress.  HEENT: head atraumatic, normocephalic, pupils equal and reactive to light, neck  supple, throat within normal limits Cardiovascular: Normal rate, regular rhythm and normal heart sounds.  No murmur heard. No BLE edema. Pulmonary/Chest: Effort normal and breath sounds normal. No respiratory distress. Abdominal: Soft.  There is no tenderness. SKIN: no rash present, a couple cherry hemangiomas noted on chest Psychiatric: Patient has a normal mood and affect. behavior is normal. Judgment and thought content normal.  Results for orders placed or performed in visit on 06/18/22  CBC with Differential/Platelet  Result Value Ref Range   WBC 6.1 3.8 - 10.8 Thousand/uL   RBC 4.36 3.80 - 5.10 Million/uL   Hemoglobin 14.0 11.7 - 15.5 g/dL   HCT 40.9 81.1 - 91.4 %   MCV 95.4 80.0 - 100.0 fL   MCH 32.1 27.0 - 33.0 pg   MCHC 33.7 32.0 - 36.0 g/dL   RDW 78.2 95.6 - 21.3 %   Platelets 326 140 - 400 Thousand/uL   MPV 9.8 7.5 - 12.5 fL   Neutro Abs 4,148 1,500 - 7,800 cells/uL   Lymphs Abs 1,415 850 - 3,900 cells/uL   Absolute Monocytes 384 200 - 950 cells/uL   Eosinophils Absolute 79 15 - 500 cells/uL   Basophils Absolute 73 0 - 200 cells/uL   Neutrophils Relative % 68 %   Total Lymphocyte 23.2 %   Monocytes Relative 6.3 %  Eosinophils Relative 1.3 %   Basophils Relative 1.2 %  COMPLETE METABOLIC PANEL WITH GFR  Result Value Ref Range   Glucose, Bld 95 65 - 99 mg/dL   BUN 13 7 - 25 mg/dL   Creat 2.95 6.21 - 3.08 mg/dL   eGFR 94 > OR = 60 MV/HQI/6.96E9   BUN/Creatinine Ratio SEE NOTE: 6 - 22 (calc)   Sodium 135 135 - 146 mmol/L   Potassium 4.2 3.5 - 5.3 mmol/L   Chloride 99 98 - 110 mmol/L   CO2 26 20 - 32 mmol/L   Calcium 10.1 8.6 - 10.4 mg/dL   Total Protein 7.2 6.1 - 8.1 g/dL   Albumin 4.9 3.6 - 5.1 g/dL   Globulin 2.3 1.9 - 3.7 g/dL (calc)   AG Ratio 2.1 1.0 - 2.5 (calc)   Total Bilirubin 0.5 0.2 - 1.2 mg/dL   Alkaline phosphatase (APISO) 57 37 - 153 U/L   AST 22 10 - 35 U/L   ALT 16 6 - 29 U/L  Lipid panel  Result Value Ref Range   Cholesterol 298 (H) <200  mg/dL   HDL 81 > OR = 50 mg/dL   Triglycerides 97 <528 mg/dL   LDL Cholesterol (Calc) 195 (H) mg/dL (calc)   Total CHOL/HDL Ratio 3.7 <5.0 (calc)   Non-HDL Cholesterol (Calc) 217 (H) <130 mg/dL (calc)  Hepatitis C antibody  Result Value Ref Range   Hepatitis C Ab NON-REACTIVE NON-REACTIVE      Assessment & Plan:   Problem List Items Addressed This Visit       Other   Hyperlipidemia - Primary    Currently on nexilzet,  will check cmp      Relevant Orders   COMPLETE METABOLIC PANEL WITH GFR   Other Visit Diagnoses     Medication management       currently on lamisil and nexlitet will check cmp   Relevant Orders   COMPLETE METABOLIC PANEL WITH GFR   Onychomycosis       currently on lamisil, will check cmp   Relevant Orders   COMPLETE METABOLIC PANEL WITH GFR   Cherry hemangioma       reassurance provided        Follow up plan: Return if symptoms worsen or fail to improve.

## 2022-08-10 ENCOUNTER — Ambulatory Visit (INDEPENDENT_AMBULATORY_CARE_PROVIDER_SITE_OTHER): Payer: Medicare HMO | Admitting: Nurse Practitioner

## 2022-08-10 ENCOUNTER — Encounter: Payer: Self-pay | Admitting: Nurse Practitioner

## 2022-08-10 ENCOUNTER — Other Ambulatory Visit: Payer: Self-pay

## 2022-08-10 VITALS — BP 110/72 | HR 69 | Temp 98.6°F | Resp 18 | Ht 62.0 in | Wt 124.5 lb

## 2022-08-10 DIAGNOSIS — Z79899 Other long term (current) drug therapy: Secondary | ICD-10-CM | POA: Diagnosis not present

## 2022-08-10 DIAGNOSIS — D1801 Hemangioma of skin and subcutaneous tissue: Secondary | ICD-10-CM | POA: Diagnosis not present

## 2022-08-10 DIAGNOSIS — B351 Tinea unguium: Secondary | ICD-10-CM

## 2022-08-10 DIAGNOSIS — E785 Hyperlipidemia, unspecified: Secondary | ICD-10-CM

## 2022-08-10 NOTE — Assessment & Plan Note (Signed)
Currently on nexilzet,  will check cmp

## 2022-08-11 LAB — COMPLETE METABOLIC PANEL WITH GFR
AG Ratio: 2.1 (calc) (ref 1.0–2.5)
ALT: 13 U/L (ref 6–29)
AST: 22 U/L (ref 10–35)
Albumin: 4.6 g/dL (ref 3.6–5.1)
Alkaline phosphatase (APISO): 49 U/L (ref 37–153)
BUN: 12 mg/dL (ref 7–25)
CO2: 29 mmol/L (ref 20–32)
Calcium: 10 mg/dL (ref 8.6–10.4)
Chloride: 95 mmol/L — ABNORMAL LOW (ref 98–110)
Creat: 0.76 mg/dL (ref 0.50–1.05)
Globulin: 2.2 g/dL (calc) (ref 1.9–3.7)
Glucose, Bld: 95 mg/dL (ref 65–99)
Potassium: 4.4 mmol/L (ref 3.5–5.3)
Sodium: 132 mmol/L — ABNORMAL LOW (ref 135–146)
Total Bilirubin: 0.5 mg/dL (ref 0.2–1.2)
Total Protein: 6.8 g/dL (ref 6.1–8.1)
eGFR: 85 mL/min/{1.73_m2} (ref >=60)

## 2022-08-15 DIAGNOSIS — M545 Low back pain, unspecified: Secondary | ICD-10-CM | POA: Diagnosis not present

## 2022-08-29 DIAGNOSIS — M545 Low back pain, unspecified: Secondary | ICD-10-CM | POA: Diagnosis not present

## 2022-09-01 ENCOUNTER — Other Ambulatory Visit: Payer: Self-pay | Admitting: Nurse Practitioner

## 2022-09-01 DIAGNOSIS — I10 Essential (primary) hypertension: Secondary | ICD-10-CM

## 2022-09-03 NOTE — Telephone Encounter (Signed)
Requested Prescriptions  Pending Prescriptions Disp Refills   metoprolol tartrate (LOPRESSOR) 25 MG tablet [Pharmacy Med Name: METOPROLOL TARTRATE 25 MG TAB] 90 tablet 1    Sig: TAKE 1/2 TABLET TWICE A DAY BY MOUTH     Cardiovascular:  Beta Blockers Passed - 09/01/2022  8:49 AM      Passed - Last BP in normal range    BP Readings from Last 1 Encounters:  08/10/22 110/72         Passed - Last Heart Rate in normal range    Pulse Readings from Last 1 Encounters:  08/10/22 69         Passed - Valid encounter within last 6 months    Recent Outpatient Visits           3 weeks ago Hyperlipidemia, unspecified hyperlipidemia type   [facility] Berniece Salines, FNP   2 months ago Hyperlipidemia, unspecified hyperlipidemia type   [facility] Berniece Salines, FNP   7 months ago Left arm pain   [facility] Berniece Salines, FNP   8 months ago Nocturia   [facility] Berniece Salines, FNP   8 months ago Hypertension, unspecified type   [facility] Berniece Salines, FNP       Future Appointments             In 3 months Zane Herald, Rudolpho Sevin, FNP [facility], [facility]

## 2022-09-05 DIAGNOSIS — M545 Low back pain, unspecified: Secondary | ICD-10-CM | POA: Diagnosis not present

## 2022-09-19 DIAGNOSIS — M545 Low back pain, unspecified: Secondary | ICD-10-CM | POA: Diagnosis not present

## 2022-09-25 ENCOUNTER — Ambulatory Visit: Payer: Self-pay

## 2022-09-25 DIAGNOSIS — M1811 Unilateral primary osteoarthritis of first carpometacarpal joint, right hand: Secondary | ICD-10-CM | POA: Diagnosis not present

## 2022-09-25 NOTE — Telephone Encounter (Signed)
Patient notified she can follow back up with [facility] for joint injection

## 2022-09-25 NOTE — Telephone Encounter (Signed)
Message from [place] sent at 09/25/2022  8:05 AM EDT  Summary: R hand pain   Pt states her hand started about a week ago.  She said this is a bad pain.  She wants to know if any drs here do injections?  She is right handed and this is really worrisome.          Chief Complaint: right hans /thumb pain  Symptoms: 6/10/ pain  Frequency: 1 week ago Pertinent Negatives: Patient denies neck pain swelling redness  Disposition: [] ED /[] Urgent Care (no appt availability in office) / [x] Appointment(In office/virtual)/ []  [facility]/ [] Home Care/ [] Refused Recommended Disposition /[] [facility]/ []  Follow-up with PCP Additional Notes: *pt requesting a injection for her thumb. Pt recalled she had injection last July with Dr Caralee Ates.   Reason for Disposition  [1] MODERATE pain (e.g., interferes with normal activities) AND [2] present > 3 days  Answer Assessment - Initial Assessment Questions 1. ONSET: "When did the pain start?"     1 week ago 2. LOCATION: "Where is the pain located?"     right 3. PAIN: "How bad is the pain?" (Scale 1-10; or mild, moderate, severe)   - MILD (1-3): doesn't interfere with normal activities   - MODERATE (4-7): interferes with normal activities (e.g., work or school) or awakens from sleep   - SEVERE (8-10): excruciating pain, unable to use hand at all     Worse at night 6/10 4. WORK OR EXERCISE: "Has there been any recent work or exercise that involved this part (i.e., hand or wrist) of the body?"     ? Gym  5. CAUSE: "What do you think is causing the pain?"     Unknown  6. AGGRAVATING FACTORS: "What makes the pain worse?" (e.g., using computer)     no 7. OTHER SYMPTOMS: "Do you have any other symptoms?" (e.g., neck pain, swelling, rash, numbness, fever)     no  Protocols used: Hand and Wrist Pain-A-AH

## 2022-09-26 ENCOUNTER — Ambulatory Visit: Payer: Medicare HMO | Admitting: Family Medicine

## 2022-10-03 DIAGNOSIS — M545 Low back pain, unspecified: Secondary | ICD-10-CM | POA: Diagnosis not present

## 2022-10-16 DIAGNOSIS — M545 Low back pain, unspecified: Secondary | ICD-10-CM | POA: Diagnosis not present

## 2022-10-16 DIAGNOSIS — R2681 Unsteadiness on feet: Secondary | ICD-10-CM | POA: Diagnosis not present

## 2022-10-23 DIAGNOSIS — R2681 Unsteadiness on feet: Secondary | ICD-10-CM | POA: Diagnosis not present

## 2022-10-23 DIAGNOSIS — M545 Low back pain, unspecified: Secondary | ICD-10-CM | POA: Diagnosis not present

## 2022-10-29 ENCOUNTER — Other Ambulatory Visit: Payer: Self-pay | Admitting: Nurse Practitioner

## 2022-10-29 DIAGNOSIS — Z1231 Encounter for screening mammogram for malignant neoplasm of breast: Secondary | ICD-10-CM

## 2022-11-06 ENCOUNTER — Ambulatory Visit: Payer: Medicare HMO | Admitting: Podiatry

## 2022-11-08 ENCOUNTER — Ambulatory Visit: Payer: Medicare HMO | Admitting: Podiatry

## 2022-11-08 DIAGNOSIS — Z79899 Other long term (current) drug therapy: Secondary | ICD-10-CM

## 2022-11-08 DIAGNOSIS — B351 Tinea unguium: Secondary | ICD-10-CM

## 2022-11-08 NOTE — Progress Notes (Signed)
Subjective:  Patient ID: Angela Espinoza, female    DOB: 1955-02-15,  MRN: [id number]  No chief complaint on file.   67 y.o. female presents with the above complaint.  Patient presents with thickened" mycotic toenails bilateral hallux second and third digit.  She states is better than it Lamisil definitely helped.  She would like to mention milligram.  Denies any other acute complaints tolerating medication well   Review of Systems: Negative except as noted in the HPI. Denies N/V/F/Ch.  Past Medical History:  Diagnosis Date   Anxiety    Asthma    NO INHALER-SMOKE AND PERFUME TRIGGERS ASTHMA   Depression    GERD (gastroesophageal reflux disease)    Hyperlipidemia    Hypertension    IBS (irritable bowel syndrome)     Current Outpatient Medications:    Bempedoic Acid-Ezetimibe (NEXLIZET) 180-10 MG TABS, Take 1 tablet by mouth daily., Disp: 90 tablet, Rfl: 1   Black Currant Seed Oil 500 MG CAPS, Take by mouth., Disp: , Rfl:    BLACK ELDERBERRY PO, Take 1 capsule by mouth daily. , Disp: , Rfl:    Black Pepper-Turmeric 3-500 MG CAPS, Take by mouth., Disp: , Rfl:    cholecalciferol (VITAMIN D3) 25 MCG (1000 UNIT) tablet, Take 1,000 Units by mouth daily., Disp: , Rfl:    co-enzyme Q-10 30 MG capsule, Take 30 mg by mouth daily., Disp: , Rfl:    DULoxetine (CYMBALTA) 30 MG capsule, Take 1 capsule (30 mg total) by mouth daily., Disp: 90 capsule, Rfl: 3   famotidine (PEPCID) 20 MG tablet, Take by mouth daily as needed., Disp: , Rfl:    GARLIC PO, Take by mouth., Disp: , Rfl:    levocetirizine (XYZAL) 5 MG tablet, Take 1 tablet (5 mg total) by mouth every evening., Disp: 90 tablet, Rfl: 3   metoprolol tartrate (LOPRESSOR) 25 MG tablet, TAKE 1/2 TABLET TWICE A DAY BY MOUTH, Disp: 90 tablet, Rfl: 1   Multiple Vitamin (MULTIVITAMIN) capsule, Take 1 capsule by mouth daily., Disp: , Rfl:    Omega-3 Fatty Acids (FISH OIL PO), Take 1 tablet by mouth daily., Disp: , Rfl:    pantoprazole  (PROTONIX) 20 MG tablet, Take 20 mg by mouth daily., Disp: , Rfl:    terbinafine (LAMISIL) 250 MG tablet, Take 1 tablet (250 mg total) by mouth daily., Disp: 90 tablet, Rfl: 0   vitamin C (ASCORBIC ACID) 500 MG tablet, Take 1,000 mg by mouth daily. , Disp: , Rfl:   Social History   Tobacco Use  Smoking Status Never   Passive exposure: Yes  Smokeless Tobacco Never  Tobacco Comments   Parents were smokers     Allergies  Allergen Reactions   Mobic [Meloxicam]     Bleeding Stomach ulcer   Indapamide Other (See Comments)    tingling all over body   Objective:  There were no vitals filed for this visit. There is no height or weight on file to calculate BMI. Constitutional Well developed. Well nourished.  Vascular Dorsalis pedis pulses palpable bilaterally. Posterior tibial pulses palpable bilaterally. Capillary refill normal to all digits.  No cyanosis or clubbing noted. Pedal hair growth normal.  Neurologic Normal speech. Oriented to person, place, and time. Epicritic sensation to light touch grossly present bilaterally.  Dermatologic Nails patient presents with thickened elongated dystrophic mycotic toenails x 6 bilateral hallux second and third digit Skin within normal limits  Orthopedic: Normal joint ROM without pain or crepitus bilaterally. No visible deformities. No bony  tenderness.   Radiographs: None Assessment:   1. Encounter for long-term (current) use of high-risk medication    Plan:  Patient was evaluated and treated and all questions answered.  Bilateral hallux second and third digit onychomycosis~second round -Educated the patient on the etiology of onychomycosis and various treatment options associated with improving the fungal load.  I explained to the patient that there is 3 treatment options available to treat the onychomycosis including topical, p.o., laser treatment.  Patient elected to undergo p.o. options with Lamisil/terbinafine therapy.  In order for  me to start the medication therapy, I explained to the patient the importance of evaluating the liver and obtaining the liver function test.  Once the liver function test comes back normal I will start him on 43-month course of Lamisil therapy.  Patient understood all risk and would like to proceed with Lamisil therapy.  I have asked the patient to immediately stop the Lamisil therapy if she has any reactions to it and call the office or go to the emergency room right away.  Patient states understanding -Patient liver function test is within normal limits Lamisil was sent to the pharmacy   No follow-ups on file.

## 2022-11-09 LAB — HEPATIC FUNCTION PANEL
ALT: 17 [IU]/L (ref 0–32)
AST: 27 [IU]/L (ref 0–40)
Albumin: 4.7 g/dL (ref 3.9–4.9)
Alkaline Phosphatase: 66 [IU]/L (ref 44–121)
Bilirubin Total: 0.3 mg/dL (ref 0.0–1.2)
Bilirubin, Direct: 0.12 mg/dL (ref 0.00–0.40)
Total Protein: 6.6 g/dL (ref 6.0–8.5)

## 2022-11-12 MED ORDER — TERBINAFINE HCL 250 MG PO TABS: 250.0000 mg | ORAL_TABLET | Freq: Every day | ORAL | 0 refills | Status: DC

## 2022-11-12 NOTE — Addendum Note (Signed)
Addended by: Nicholes Rough on: 11/12/2022 09:32 AM   Modules accepted: Orders

## 2022-12-13 ENCOUNTER — Ambulatory Visit
Admission: RE | Admit: 2022-12-13 | Discharge: 2022-12-13 | Disposition: A | Discharge status: Home / Self Care | Payer: Medicare HMO | Source: Ambulatory Visit | Attending: Nurse Practitioner | Admitting: Nurse Practitioner

## 2022-12-13 DIAGNOSIS — Z1231 Encounter for screening mammogram for malignant neoplasm of breast: Secondary | ICD-10-CM | POA: Insufficient documentation

## 2022-12-18 ENCOUNTER — Telehealth: Payer: Self-pay | Admitting: Nurse Practitioner

## 2022-12-18 NOTE — Telephone Encounter (Unsigned)
Copied from CRM [id number]. Topic: Appointments - Other >> Dec 18, 2022 10:08 AM Shon Hale wrote: Reason for CRM: Patient has a visit tomorrow 12-19-2022 for a 6 mo follow up. Pt wanting to know if she needs to fast for any possible labs.

## 2022-12-18 NOTE — Telephone Encounter (Signed)
Patient notified

## 2022-12-18 NOTE — Telephone Encounter (Signed)
Had labs in may. Will you repeat on tomorrow

## 2022-12-19 ENCOUNTER — Encounter: Payer: Self-pay | Admitting: Nurse Practitioner

## 2022-12-19 ENCOUNTER — Ambulatory Visit (INDEPENDENT_AMBULATORY_CARE_PROVIDER_SITE_OTHER): Payer: Medicare HMO | Admitting: Nurse Practitioner

## 2022-12-19 VITALS — BP 112/74 | HR 63 | Temp 97.9°F | Resp 16 | Ht 62.0 in | Wt 131.1 lb

## 2022-12-19 DIAGNOSIS — I2510 Atherosclerotic heart disease of native coronary artery without angina pectoris: Secondary | ICD-10-CM

## 2022-12-19 DIAGNOSIS — Z79899 Other long term (current) drug therapy: Secondary | ICD-10-CM

## 2022-12-19 DIAGNOSIS — Z8719 Personal history of other diseases of the digestive system: Secondary | ICD-10-CM

## 2022-12-19 DIAGNOSIS — J301 Allergic rhinitis due to pollen: Secondary | ICD-10-CM | POA: Diagnosis not present

## 2022-12-19 DIAGNOSIS — M25562 Pain in left knee: Secondary | ICD-10-CM

## 2022-12-19 DIAGNOSIS — E785 Hyperlipidemia, unspecified: Secondary | ICD-10-CM

## 2022-12-19 DIAGNOSIS — J4520 Mild intermittent asthma, uncomplicated: Secondary | ICD-10-CM | POA: Diagnosis not present

## 2022-12-19 DIAGNOSIS — I10 Essential (primary) hypertension: Secondary | ICD-10-CM | POA: Diagnosis not present

## 2022-12-19 DIAGNOSIS — K219 Gastro-esophageal reflux disease without esophagitis: Secondary | ICD-10-CM

## 2022-12-19 DIAGNOSIS — E782 Mixed hyperlipidemia: Secondary | ICD-10-CM

## 2022-12-19 DIAGNOSIS — Z131 Encounter for screening for diabetes mellitus: Secondary | ICD-10-CM | POA: Diagnosis not present

## 2022-12-19 DIAGNOSIS — K311 Adult hypertrophic pyloric stenosis: Secondary | ICD-10-CM

## 2022-12-19 DIAGNOSIS — I2583 Coronary atherosclerosis due to lipid rich plaque: Secondary | ICD-10-CM

## 2022-12-19 DIAGNOSIS — M545 Low back pain, unspecified: Secondary | ICD-10-CM | POA: Diagnosis not present

## 2022-12-19 DIAGNOSIS — G8929 Other chronic pain: Secondary | ICD-10-CM

## 2022-12-19 NOTE — Assessment & Plan Note (Signed)
Blood pressure at goal, patient would like to try and come off blood pressure medication. Discussed decreasing metoprolol to once a day,  monitor blood pressure if still in normal range can discontinue.  Continue to monitor blood pressure

## 2022-12-19 NOTE — Assessment & Plan Note (Signed)
Continues to take pantoprazole 20 mg daily.

## 2022-12-19 NOTE — Assessment & Plan Note (Signed)
Stable, no changes  

## 2022-12-19 NOTE — Progress Notes (Signed)
BP 112/74 (BP Location: Right Arm, Patient Position: Sitting, Cuff Size: Normal)   Pulse 63   Temp 97.9 F (36.6 C) (Oral)   Resp 16   Ht 5\' 2"  (1.575 m) Comment: per chart  Wt 131 lb 1.6 oz (59.5 kg)   SpO2 98%   BMI 23.98 kg/m    Subjective:    Patient ID: Angela Espinoza, female    DOB: 20-Nov-1955, 67 y.o.   MRN: [id number]  HPI: Angela Espinoza is a 66 y.o. female  Chief Complaint  Patient presents with   Follow-up   Hypertension:  -Medications: metoprolol 12.5 mg BID -Patient is compliant with above medications and reports no side effects. -Checking BP at home (average): does not check at home.  -Denies any SOB, CP, vision changes, LE edema or symptoms of hypotension -Diet: recommend DASH diet  -Exercise: recommend 150 min of physical activity weekly     She would like to come off the blood pressure medication. She is going to decrease metoprolol to once a day and monitor blood pressure and if still good will discontinue.      12/19/2022   10:55 AM 08/10/2022   11:24 AM 06/18/2022    9:41 AM  Vitals with BMI  Height 5\' 2"  5\' 2"  5\' 2"   Weight 131 lbs 2 oz 124 lbs 8 oz 122 lbs 13 oz  BMI 23.97 22.77 22.45  Systolic 112 110 409  Diastolic 74 72 80  Pulse 63 69 71    Asthma:  patient reports using her albuterol inhaler as needed.  She reports that she has reactive asthma.  She says it is exacerbated by smoke and strong cologne.   Duodenal ulcer/GI bleed/pyloric stenosis: Currently under the care of Dr. Mia Creek gastroenterology at [facility].  She was taking meloxicam for back pain status post a traumatic injury.  In December 2022 patient went to the emergency room in cardiac arrest.  She was diagnosed with a GI bleed.  On CT they found a large and deep ulceration at the duodenal bulb.  Last upper endoscopy was done on Jun 06, 2021. She reports she is doing well.  Findings showed - Normal esophagus. - A medium amount of food (residue) in the  stomach. - Acquired deformity in the pylorus. Dilated. - Normal examined duodenum. Patient reports she has had no issues since.  Patient avoids NSAIDs.  Patient denies any abdominal pain, or blood in stool. Her last GI appointment was 05/30/2022. She is currently on cymbalta which has helped. She has also been weaned down on her PPI and currently takes pantoprazole 20 mg daily. Patient reports she is doing well and there have been no changes.   Seasonal allergies: Patient takes Xyzal for her allergies.  Patient states this works well for her.  Condition stable  HLD/CAD:  -Medications: nexlizet 180-10 mg daily.  She says that she will work on lifestyle modification.  -Last lipid panel:   Lipid Panel     Component Value Date/Time   CHOL 298 (H) 06/18/2022 1009   TRIG 97 06/18/2022 1009   HDL 81 06/18/2022 1009   CHOLHDL 3.7 06/18/2022 1009   LDLCALC 195 (H) 06/18/2022 1009    The 10-year ASCVD risk score (Arnett DK, et al., 2019) is: 8.3%   Values used to calculate the score:     Age: 65 years     Sex: Female     Is Non-Hispanic African American: No     Diabetic: No  Tobacco smoker: No     Systolic Blood Pressure: 112 mmHg     Is BP treated: Yes     HDL Cholesterol: 81 mg/dL     Total Cholesterol: 298 mg/dL   Chronic back pain: She is currently taking cymbalta 30 mg daily for her chronic back pain.  Not working with pain management any more. She was getting dry needling and physical therapy at [facility]. She had reported that it had really helped.     12/19/2022   10:57 AM 08/10/2022   11:23 AM 06/18/2022    9:46 AM 02/13/2022   10:01 AM 01/11/2022   12:55 PM  Depression screen PHQ 2/9  Decreased Interest 0 0 0 0 0  Down, Depressed, Hopeless 0 0 0 0 0  PHQ - 2 Score 0 0 0 0 0  Altered sleeping 0   0   Tired, decreased energy 0   0   Change in appetite 0   0   Feeling bad or failure about yourself  0   0   Trouble concentrating 0   0   Moving slowly or fidgety/restless 0    0   Suicidal thoughts 0   0   PHQ-9 Score 0   0     Left knee pain: she reports that she has been going to the gym but now she has left knee pain. She reports she did have dry needling done on her knee and it did not help.  She denies any trauma.  She says the pain doesn't start until after the work out.  She is going to follow up with orthopedics.    Relevant past medical, surgical, family and social history reviewed and updated as indicated. Interim medical history since our last visit reviewed. Allergies and medications reviewed and updated.  Review of Systems  Constitutional: Negative for fever or weight change.  Respiratory: Negative for cough and shortness of breath.   Cardiovascular: Negative for chest pain or palpitations.  Gastrointestinal: Positive for abdominal pain, no bowel changes.  Musculoskeletal: Negative for gait problem or joint swelling. Positive for left knee pain Skin: Negative for rash.  Neurological: Negative for dizziness or headache.  No other specific complaints in a complete review of systems (except as listed in HPI above).      Objective:    BP 112/74 (BP Location: Right Arm, Patient Position: Sitting, Cuff Size: Normal)   Pulse 63   Temp 97.9 F (36.6 C) (Oral)   Resp 16   Ht 5\' 2"  (1.575 m) Comment: per chart  Wt 131 lb 1.6 oz (59.5 kg)   SpO2 98%   BMI 23.98 kg/m   Wt Readings from Last 3 Encounters:  12/19/22 131 lb 1.6 oz (59.5 kg)  08/10/22 124 lb 8 oz (56.5 kg)  06/18/22 122 lb 12.8 oz (55.7 kg)    Physical Exam  Constitutional: Patient appears well-developed and well-nourished.  No distress.  HEENT: head atraumatic, normocephalic, pupils equal and reactive to light,  neck supple Cardiovascular: Normal rate, regular rhythm and normal heart sounds.  No murmur heard. No BLE edema. Pulmonary/Chest: Effort normal and breath sounds normal. No respiratory distress. Abdominal: Soft.  There is no tenderness. Psychiatric: Patient has a normal  mood and affect. behavior is normal. Judgment and thought content normal.  Results for orders placed or performed in visit on 11/08/22  Hepatic Function Panel  Result Value Ref Range   Total Protein 6.6 6.0 - 8.5 g/dL   Albumin  4.7 3.9 - 4.9 g/dL   Bilirubin Total 0.3 0.0 - 1.2 mg/dL   Bilirubin, Direct 8.65 0.00 - 0.40 mg/dL   Alkaline Phosphatase 66 44 - 121 IU/L   AST 27 0 - 40 IU/L   ALT 17 0 - 32 IU/L      Assessment & Plan:   Problem List Items Addressed This Visit       Cardiovascular and Mediastinum   Hypertension    Blood pressure at goal, patient would like to try and come off blood pressure medication. Discussed decreasing metoprolol to once a day,  monitor blood pressure if still in normal range can discontinue.  Continue to monitor blood pressure      Relevant Orders   CBC with Differential/Platelet   COMPLETE METABOLIC PANEL WITH GFR   Coronary artery disease due to lipid rich plaque    Does not tolerate statins, on nexlizet.  Discussed repatha if cholesterol not improved.       Relevant Orders   Lipid panel     Respiratory   Asthma without status asthmaticus    Stable, no changes      Seasonal allergic rhinitis due to pollen    Stable, no changes        Digestive   Acid reflux (Chronic)    Need to take pantoprazole 20 mg daily      Pyloric stenosis    Continues to take pantoprazole 20 mg daily.        Other   History of GI bleed    Doing well, no recurrence      Chronic midline low back pain without sciatica    Improved with dry needling      Hyperlipidemia - Primary    Currently taking nexlizet 180-10 mg daily. Discussed repatha if no improvement      Relevant Orders   Lipid panel   RESOLVED: Medication management   Other Visit Diagnoses     Screening for diabetes mellitus       Acute pain of left knee       follow up with [facility]         Follow up plan: Return in about 6 months (around 06/18/2023) for follow  up.

## 2022-12-19 NOTE — Assessment & Plan Note (Signed)
Does not tolerate statins, on nexlizet.  Discussed repatha if cholesterol not improved.

## 2022-12-19 NOTE — Assessment & Plan Note (Signed)
Currently taking nexlizet 180-10 mg daily. Discussed repatha if no improvement

## 2022-12-19 NOTE — Assessment & Plan Note (Signed)
Improved with dry needling

## 2022-12-19 NOTE — Assessment & Plan Note (Signed)
Doing well, no recurrence

## 2022-12-19 NOTE — Assessment & Plan Note (Signed)
Need to take pantoprazole 20 mg daily

## 2022-12-20 DIAGNOSIS — M25562 Pain in left knee: Secondary | ICD-10-CM | POA: Diagnosis not present

## 2022-12-20 DIAGNOSIS — M2392 Unspecified internal derangement of left knee: Secondary | ICD-10-CM | POA: Diagnosis not present

## 2022-12-20 LAB — COMPLETE METABOLIC PANEL WITHOUT GFR
AG Ratio: 2.1 (calc) (ref 1.0–2.5)
ALT: 15 U/L (ref 6–29)
AST: 28 U/L (ref 10–35)
Albumin: 4.7 g/dL (ref 3.6–5.1)
Alkaline phosphatase (APISO): 40 U/L (ref 37–153)
BUN: 12 mg/dL (ref 7–25)
CO2: 26 mmol/L (ref 20–32)
Calcium: 9.6 mg/dL (ref 8.6–10.4)
Chloride: 93 mmol/L — ABNORMAL LOW (ref 98–110)
Creat: 0.77 mg/dL (ref 0.50–1.05)
Globulin: 2.2 g/dL (ref 1.9–3.7)
Glucose, Bld: 85 mg/dL (ref 65–99)
Potassium: 4.4 mmol/L (ref 3.5–5.3)
Sodium: 129 mmol/L — ABNORMAL LOW (ref 135–146)
Total Bilirubin: 0.5 mg/dL (ref 0.2–1.2)
Total Protein: 6.9 g/dL (ref 6.1–8.1)
eGFR: 84 mL/min/1.73m2

## 2022-12-20 LAB — CBC WITH DIFFERENTIAL/PLATELET
Absolute Lymphocytes: 1672 {cells}/uL (ref 850–3900)
Absolute Monocytes: 636 {cells}/uL (ref 200–950)
Basophils Absolute: 59 {cells}/uL (ref 0–200)
Basophils Relative: 0.8 %
Eosinophils Absolute: 89 {cells}/uL (ref 15–500)
Eosinophils Relative: 1.2 %
HCT: 37.2 % (ref 35.0–45.0)
Hemoglobin: 12.5 g/dL (ref 11.7–15.5)
MCH: 32.6 pg (ref 27.0–33.0)
MCHC: 33.6 g/dL (ref 32.0–36.0)
MCV: 96.9 fL (ref 80.0–100.0)
MPV: 9.9 fL (ref 7.5–12.5)
Monocytes Relative: 8.6 %
Neutro Abs: 4943 {cells}/uL (ref 1500–7800)
Neutrophils Relative %: 66.8 %
Platelets: 373 10*3/uL (ref 140–400)
RBC: 3.84 10*6/uL (ref 3.80–5.10)
RDW: 11.4 % (ref 11.0–15.0)
Total Lymphocyte: 22.6 %
WBC: 7.4 10*3/uL (ref 3.8–10.8)

## 2022-12-20 LAB — LIPID PANEL
Cholesterol: 172 mg/dL
HDL: 78 mg/dL
LDL Cholesterol (Calc): 80 mg/dL
Non-HDL Cholesterol (Calc): 94 mg/dL
Total CHOL/HDL Ratio: 2.2 (calc)
Triglycerides: 65 mg/dL

## 2022-12-28 DIAGNOSIS — M5416 Radiculopathy, lumbar region: Secondary | ICD-10-CM | POA: Diagnosis not present

## 2023-01-05 ENCOUNTER — Other Ambulatory Visit: Payer: Self-pay | Admitting: Nurse Practitioner

## 2023-01-05 DIAGNOSIS — I2510 Atherosclerotic heart disease of native coronary artery without angina pectoris: Secondary | ICD-10-CM

## 2023-01-05 DIAGNOSIS — E785 Hyperlipidemia, unspecified: Secondary | ICD-10-CM

## 2023-01-08 NOTE — Telephone Encounter (Signed)
Requested medication (s) are due for refill today: yes  Requested medication (s) are on the active medication list: yes    Last refill: 07/04/22  #90 1 refill  Future visit scheduled yes 06/19/23  Notes to clinic: off protocol, please review. Thank you.  Requested Prescriptions  Pending Prescriptions Disp Refills   NEXLIZET 180-10 MG TABS [Pharmacy Med Name: NEXLIZET 180-10 MG TABLET] 30 tablet 5    Sig: TAKE 1 TABLET BY MOUTH EVERY DAY     Off-Protocol Failed - 01/05/2023  4:03 PM      Failed - Medication not assigned to a protocol, review manually.      Passed - Valid encounter within last 12 months    Recent Outpatient Visits           2 weeks ago Mixed hyperlipidemia   [facility] Della Goo F, FNP   5 months ago Hyperlipidemia, unspecified hyperlipidemia type   [facility] Berniece Salines, FNP   6 months ago Hyperlipidemia, unspecified hyperlipidemia type   [facility] Berniece Salines, FNP   1 year ago Left arm pain   [facility] Berniece Salines, FNP   1 year ago Nocturia   [facility] Berniece Salines, FNP       Future Appointments             In 5 months Zane Herald, Rudolpho Sevin, FNP [facility]

## 2023-01-09 DIAGNOSIS — M5416 Radiculopathy, lumbar region: Secondary | ICD-10-CM | POA: Diagnosis not present

## 2023-01-18 DIAGNOSIS — M5416 Radiculopathy, lumbar region: Secondary | ICD-10-CM | POA: Diagnosis not present

## 2023-01-29 DIAGNOSIS — M533 Sacrococcygeal disorders, not elsewhere classified: Secondary | ICD-10-CM | POA: Diagnosis not present

## 2023-02-07 ENCOUNTER — Encounter: Payer: Self-pay | Admitting: Podiatry

## 2023-02-07 ENCOUNTER — Ambulatory Visit: Payer: Medicare HMO | Admitting: Podiatry

## 2023-02-07 DIAGNOSIS — B351 Tinea unguium: Secondary | ICD-10-CM

## 2023-02-07 DIAGNOSIS — Z79899 Other long term (current) drug therapy: Secondary | ICD-10-CM

## 2023-02-07 MED ORDER — CICLOPIROX 8 % EX SOLN: Freq: Every day | CUTANEOUS | 0 refills | Status: DC

## 2023-02-07 NOTE — Progress Notes (Signed)
 Subjective:  Patient ID: Angela Espinoza, female    DOB: [birth year],  MRN: [id number]  Chief Complaint  Patient presents with   Nail Problem    I think I'm doing okay.  They don't really grow.  My husband said it's not a fungus.    68 y.o. female presents with the above complaint.  Patient presents with thickened mycotic toenails bilateral hallux second and third digit.  She is this is little bit better.  She still has some residual fungal infection denies any other acute complaints  Review of Systems: Negative except as noted in the HPI. Denies N/V/F/Ch.  Past Medical History:  Diagnosis Date   Anxiety    Asthma    NO INHALER-SMOKE AND PERFUME TRIGGERS ASTHMA   Depression    GERD (gastroesophageal reflux disease)    Hyperlipidemia    Hypertension    IBS (irritable bowel syndrome)     Current Outpatient Medications:    BLACK ELDERBERRY PO, Take 1 capsule by mouth daily. , Disp: , Rfl:    Black Pepper-Turmeric 3-500 MG CAPS, Take by mouth., Disp: , Rfl:    cholecalciferol (VITAMIN D3) 25 MCG (1000 UNIT) tablet, Take 1,000 Units by mouth daily., Disp: , Rfl:    ciclopirox  (PENLAC ) 8 % solution, Apply topically at bedtime. Apply over nail and surrounding skin. Apply daily over previous coat. After seven (7) days, may remove with alcohol and continue cycle., Disp: 6.6 mL, Rfl: 0   co-enzyme Q-10 30 MG capsule, Take 30 mg by mouth daily., Disp: , Rfl:    DULoxetine  (CYMBALTA ) 30 MG capsule, Take 1 capsule (30 mg total) by mouth daily., Disp: 90 capsule, Rfl: 3   GARLIC PO, Take by mouth., Disp: , Rfl:    levocetirizine (XYZAL ) 5 MG tablet, Take 1 tablet (5 mg total) by mouth every evening., Disp: 90 tablet, Rfl: 3   Multiple Vitamin (MULTIVITAMIN) capsule, Take 1 capsule by mouth daily., Disp: , Rfl:    NEXLIZET  180-10 MG TABS, TAKE 1 TABLET BY MOUTH EVERY DAY, Disp: 30 tablet, Rfl: 5   Omega-3 Fatty Acids (FISH OIL PO), Take 1 tablet by mouth daily., Disp: , Rfl:     pantoprazole  (PROTONIX ) 20 MG tablet, Take 20 mg by mouth daily., Disp: , Rfl:    terbinafine  (LAMISIL ) 250 MG tablet, Take 1 tablet (250 mg total) by mouth daily., Disp: 90 tablet, Rfl: 0   vitamin C (ASCORBIC ACID) 500 MG tablet, Take 1,000 mg by mouth daily. , Disp: , Rfl:    metoprolol  tartrate (LOPRESSOR ) 25 MG tablet, TAKE 1/2 TABLET TWICE A DAY BY MOUTH, Disp: 90 tablet, Rfl: 1  Social History   Tobacco Use  Smoking Status Never   Passive exposure: Yes  Smokeless Tobacco Never  Tobacco Comments   Parents were smokers     Allergies  Allergen Reactions   Mobic [Meloxicam]     Bleeding Stomach ulcer   Indapamide Other (See Comments)    tingling all over body   Objective:  There were no vitals filed for this visit. There is no height or weight on file to calculate BMI. Constitutional Well developed. Well nourished.  Vascular Dorsalis pedis pulses palpable bilaterally. Posterior tibial pulses palpable bilaterally. Capillary refill normal to all digits.  No cyanosis or clubbing noted. Pedal hair growth normal.  Neurologic Normal speech. Oriented to person, place, and time. Epicritic sensation to light touch grossly present bilaterally.  Dermatologic Nails patient presents with thickened elongated dystrophic mycotic toenails x 6  bilateral hallux second and third digit improved Skin within normal limits  Orthopedic: Normal joint ROM without pain or crepitus bilaterally. No visible deformities. No bony tenderness.   Radiographs: None Assessment:   No diagnosis found.  Plan:  Patient was evaluated and treated and all questions answered.  Bilateral hallux second and third digit onychomycosis~second round -Clinically doing better still some residual left.  Patient would like to try topical medication at this time.  Penlac  was sent to the pharmacy if any foot and ankle issues on future she will come back and see me.   No follow-ups on file.

## 2023-02-18 DIAGNOSIS — H43811 Vitreous degeneration, right eye: Secondary | ICD-10-CM | POA: Diagnosis not present

## 2023-02-20 DIAGNOSIS — M792 Neuralgia and neuritis, unspecified: Secondary | ICD-10-CM | POA: Diagnosis not present

## 2023-03-07 ENCOUNTER — Telehealth: Payer: Self-pay

## 2023-03-07 NOTE — Telephone Encounter (Signed)
 Info faxed

## 2023-03-07 NOTE — Telephone Encounter (Signed)
 Copied from CRM [id number]. Topic: General - Inquiry >> Mar 07, 2023  3:05 PM Teressa P wrote: Reason for CRM: Webster Hal with [facility] called to say the medication NEXLIZET  needs addition information by 2/8 or it will denied.  CB@  [phone]  Ref#  [id number]

## 2023-03-22 IMAGING — CR DG LUMBAR SPINE COMPLETE W/ BEND
1 series · 7 of 7 positions shown · non-contrast
Comparison: X-ray lumbar 12/10/2017

CLINICAL DATA: Low back pain

EXAM:
LUMBAR SPINE - COMPLETE WITH BENDING VIEWS

[Series 1: dg lumbar spine complete w/bend 6+v · 0.14mm/px · 7 of 7 slices shown]
[im 1/7]
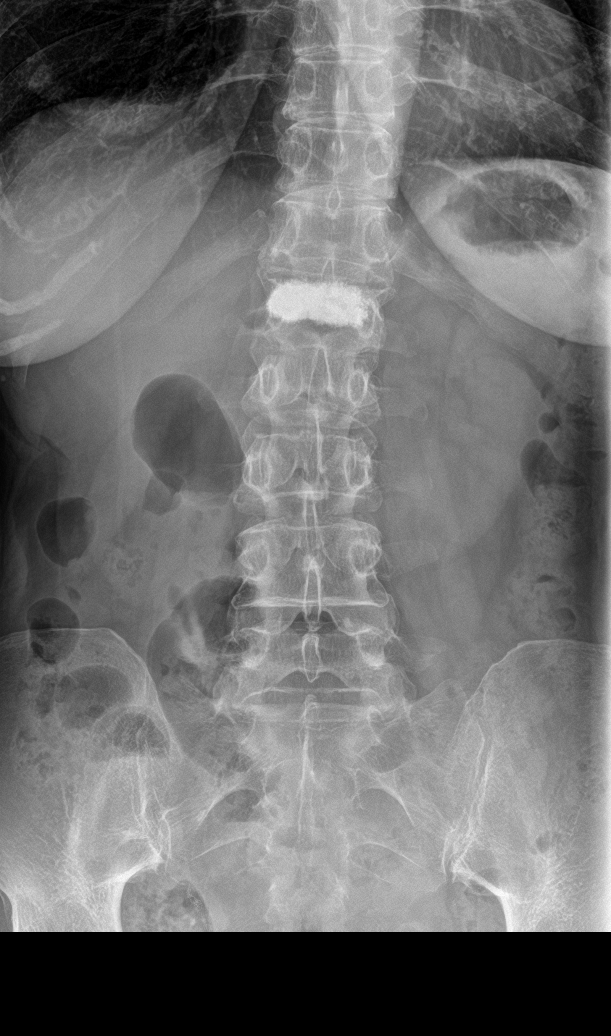
[im 2/7]
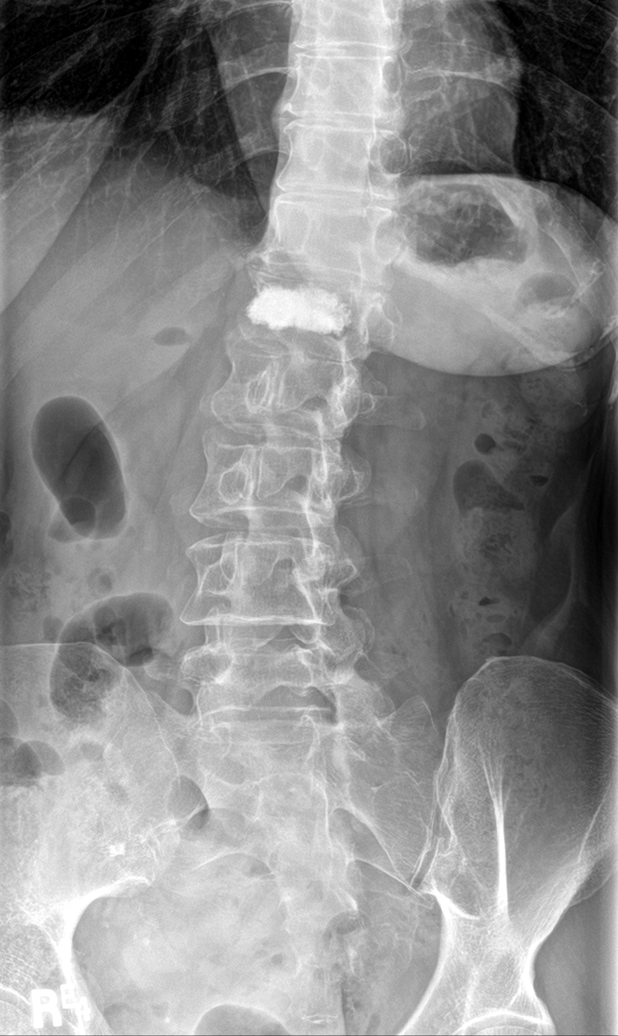
[im 3/7]
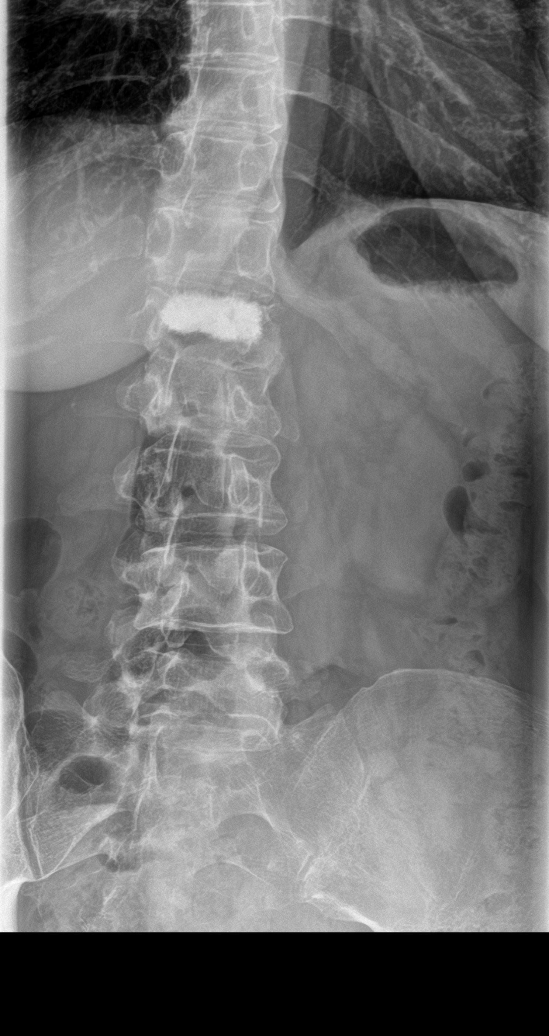
[im 4/7]
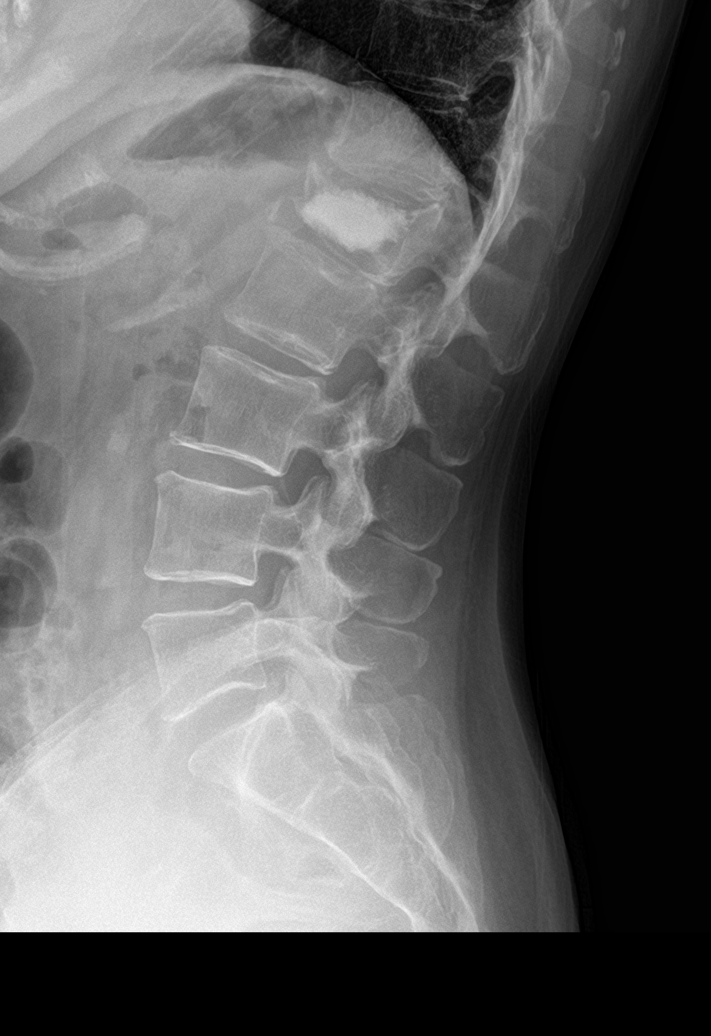
[im 5/7]
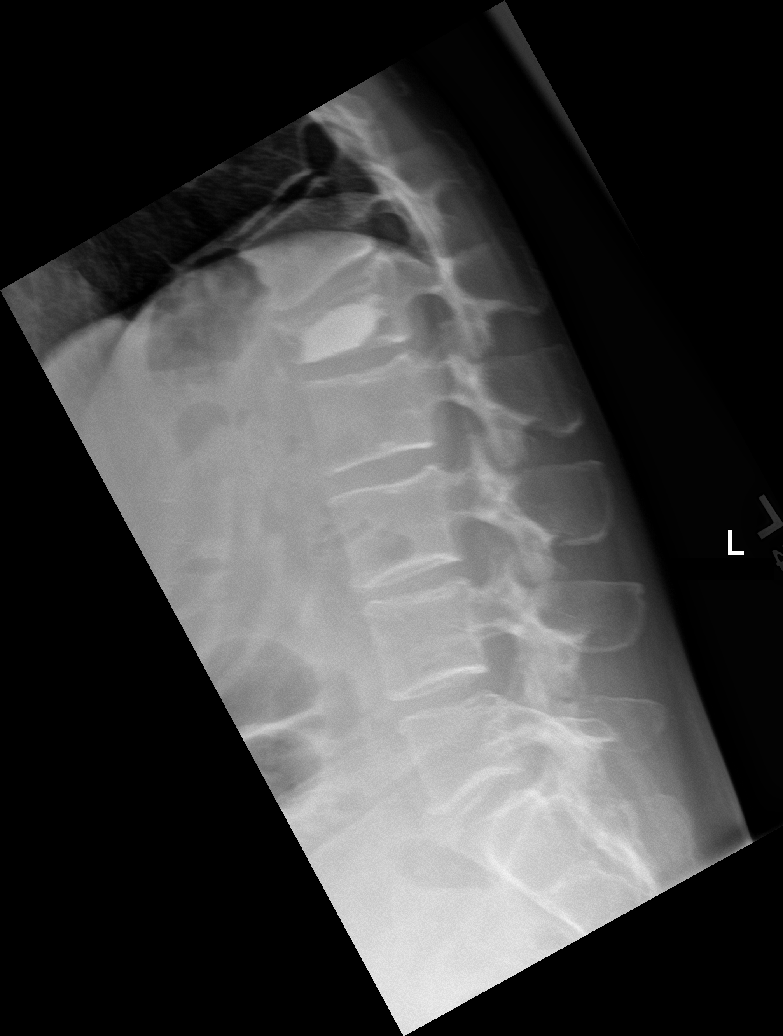
[im 6/7]
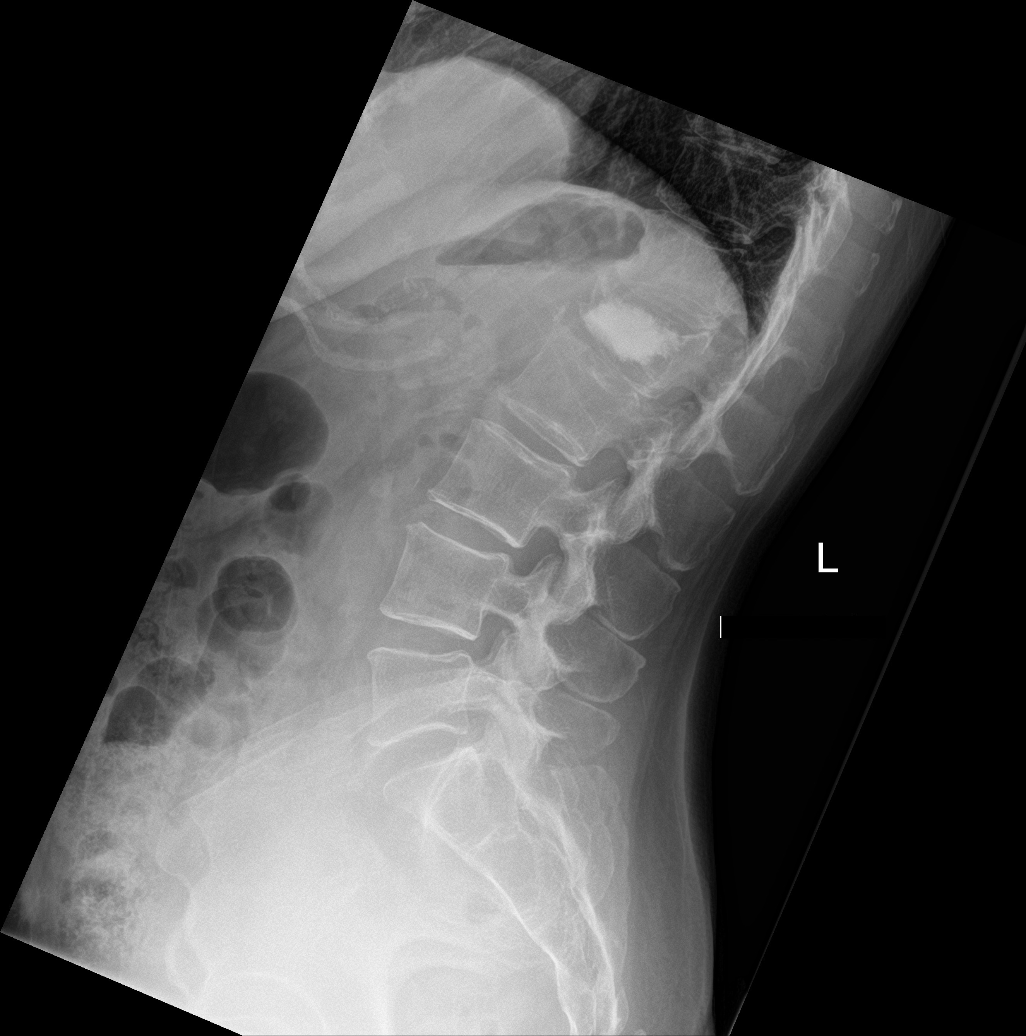
[im 7/7]
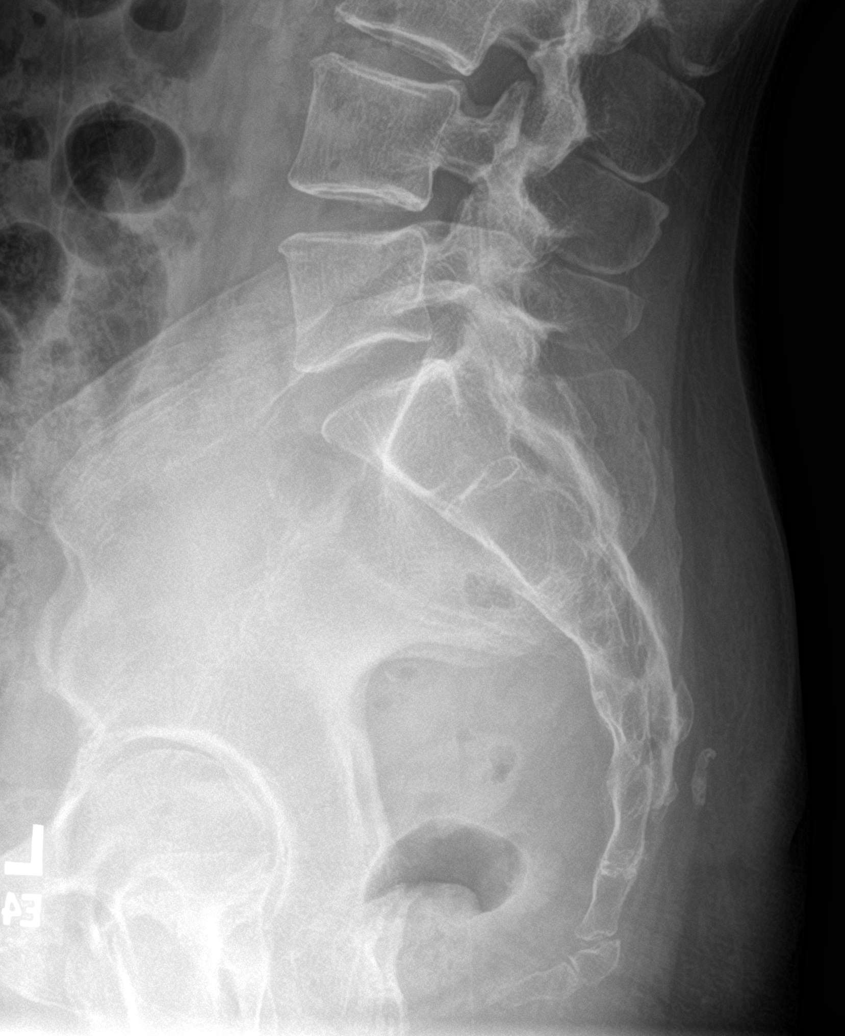

[7 of 7 positions shown; findings below may reference images not displayed]

FINDINGS: Markedly limited evaluation due to overlapping osseous structures
and overlying soft tissues.

Five non-rib-bearing lumbar vertebral bodies.

Compression fracture status post kyphoplasty of the L1 vertebral
body. There is no evidence of lumbar spine fracture. Alignment is
normal. No change in alignment on flexion and extension views.
Intervertebral disc spaces are maintained.
IMPRESSION: No acute displaced fracture or traumatic listhesis of the lumbar
spine.

## 2023-03-26 DIAGNOSIS — H43811 Vitreous degeneration, right eye: Secondary | ICD-10-CM | POA: Diagnosis not present

## 2023-03-28 DIAGNOSIS — R202 Paresthesia of skin: Secondary | ICD-10-CM | POA: Diagnosis not present

## 2023-04-03 ENCOUNTER — Other Ambulatory Visit: Payer: Self-pay | Admitting: Nurse Practitioner

## 2023-04-03 ENCOUNTER — Other Ambulatory Visit: Payer: Self-pay | Admitting: Podiatry

## 2023-04-03 DIAGNOSIS — M792 Neuralgia and neuritis, unspecified: Secondary | ICD-10-CM | POA: Diagnosis not present

## 2023-04-03 DIAGNOSIS — M545 Low back pain, unspecified: Secondary | ICD-10-CM

## 2023-04-04 NOTE — Telephone Encounter (Signed)
 Requested Prescriptions  Pending Prescriptions Disp Refills   DULoxetine (CYMBALTA) 30 MG capsule [Pharmacy Med Name: DULOXETINE HCL DR 30 MG CAP] 90 capsule 0    Sig: TAKE 1 CAPSULE BY MOUTH EVERY DAY     Psychiatry: Antidepressants - SNRI - duloxetine Passed - 04/04/2023  2:24 PM      Passed - Cr in normal range and within 360 days    Creat  Date Value Ref Range Status  12/19/2022 0.77 0.50 - 1.05 mg/dL Final         Passed - eGFR is 30 or above and within 360 days    EGFR (African American)  Date Value Ref Range Status  08/12/2011 >60  Final   GFR calc Af Amer  Date Value Ref Range Status  01/01/2018 81 >59 mL/min/1.73 Final   EGFR (Non-African Amer.)  Date Value Ref Range Status  08/12/2011 >60  Final    Comment:    eGFR values <24mL/min/1.73 m2 may be an indication of chronic kidney disease (CKD). Calculated eGFR is useful in patients with stable renal function. The eGFR calculation will not be reliable in acutely ill patients when serum creatinine is changing rapidly. It is not useful in  patients on dialysis. The eGFR calculation may not be applicable to patients at the low and high extremes of body sizes, pregnant women, and vegetarians.    GFR, Estimated  Date Value Ref Range Status  01/31/2021 >60 >60 mL/min Final    Comment:    (NOTE) Calculated using the CKD-EPI Creatinine Equation (2021)    eGFR  Date Value Ref Range Status  12/19/2022 84 > OR = 60 mL/min/1.25m2 Final         Passed - Completed PHQ-2 or PHQ-9 in the last 360 days      Passed - Last BP in normal range    BP Readings from Last 1 Encounters:  12/19/22 112/74         Passed - Valid encounter within last 6 months    Recent Outpatient Visits           3 months ago Mixed hyperlipidemia   [facility] Della Goo F, FNP   7 months ago Hyperlipidemia, unspecified hyperlipidemia type   [facility] Berniece Salines, FNP   9 months  ago Hyperlipidemia, unspecified hyperlipidemia type   [facility] Berniece Salines, FNP   1 year ago Left arm pain   [facility] Berniece Salines, FNP   1 year ago Nocturia   [facility] Berniece Salines, FNP       Future Appointments             In 2 months Zane Herald, Rudolpho Sevin, FNP [facility]

## 2023-04-29 DIAGNOSIS — M1712 Unilateral primary osteoarthritis, left knee: Secondary | ICD-10-CM | POA: Diagnosis not present

## 2023-05-15 DIAGNOSIS — M5451 Vertebrogenic low back pain: Secondary | ICD-10-CM | POA: Diagnosis not present

## 2023-05-15 DIAGNOSIS — M25562 Pain in left knee: Secondary | ICD-10-CM | POA: Diagnosis not present

## 2023-05-27 DIAGNOSIS — D2262 Melanocytic nevi of left upper limb, including shoulder: Secondary | ICD-10-CM | POA: Diagnosis not present

## 2023-05-27 DIAGNOSIS — D2261 Melanocytic nevi of right upper limb, including shoulder: Secondary | ICD-10-CM | POA: Diagnosis not present

## 2023-05-27 DIAGNOSIS — L570 Actinic keratosis: Secondary | ICD-10-CM | POA: Diagnosis not present

## 2023-05-27 DIAGNOSIS — D225 Melanocytic nevi of trunk: Secondary | ICD-10-CM | POA: Diagnosis not present

## 2023-05-27 DIAGNOSIS — D2272 Melanocytic nevi of left lower limb, including hip: Secondary | ICD-10-CM | POA: Diagnosis not present

## 2023-05-27 DIAGNOSIS — D2271 Melanocytic nevi of right lower limb, including hip: Secondary | ICD-10-CM | POA: Diagnosis not present

## 2023-05-31 DIAGNOSIS — M5451 Vertebrogenic low back pain: Secondary | ICD-10-CM | POA: Diagnosis not present

## 2023-05-31 DIAGNOSIS — M25562 Pain in left knee: Secondary | ICD-10-CM | POA: Diagnosis not present

## 2023-06-03 DIAGNOSIS — Z1211 Encounter for screening for malignant neoplasm of colon: Secondary | ICD-10-CM | POA: Diagnosis not present

## 2023-06-03 DIAGNOSIS — Z8719 Personal history of other diseases of the digestive system: Secondary | ICD-10-CM | POA: Diagnosis not present

## 2023-06-03 DIAGNOSIS — R109 Unspecified abdominal pain: Secondary | ICD-10-CM | POA: Diagnosis not present

## 2023-06-03 DIAGNOSIS — Z09 Encounter for follow-up examination after completed treatment for conditions other than malignant neoplasm: Secondary | ICD-10-CM | POA: Diagnosis not present

## 2023-06-05 DIAGNOSIS — M5451 Vertebrogenic low back pain: Secondary | ICD-10-CM | POA: Diagnosis not present

## 2023-06-05 DIAGNOSIS — M25562 Pain in left knee: Secondary | ICD-10-CM | POA: Diagnosis not present

## 2023-06-07 DIAGNOSIS — M3501 Sicca syndrome with keratoconjunctivitis: Secondary | ICD-10-CM | POA: Diagnosis not present

## 2023-06-07 DIAGNOSIS — H43811 Vitreous degeneration, right eye: Secondary | ICD-10-CM | POA: Diagnosis not present

## 2023-06-07 DIAGNOSIS — Z961 Presence of intraocular lens: Secondary | ICD-10-CM | POA: Diagnosis not present

## 2023-06-10 ENCOUNTER — Other Ambulatory Visit: Payer: Self-pay | Admitting: Nurse Practitioner

## 2023-06-10 DIAGNOSIS — M545 Other chronic pain: Secondary | ICD-10-CM

## 2023-06-10 DIAGNOSIS — J301 Allergic rhinitis due to pollen: Secondary | ICD-10-CM

## 2023-06-10 DIAGNOSIS — G8929 Other chronic pain: Secondary | ICD-10-CM

## 2023-06-12 NOTE — Telephone Encounter (Signed)
 Requested Prescriptions  Pending Prescriptions Disp Refills   DULoxetine  (CYMBALTA ) 30 MG capsule [Pharmacy Med Name: DULOXETINE  HCL DR 30 MG CAP] 90 capsule 0    Sig: TAKE 1 CAPSULE BY MOUTH EVERY DAY     Psychiatry: Antidepressants - SNRI - duloxetine  Failed - 06/12/2023 11:49 AM      Failed - Valid encounter within last 6 months    Recent Outpatient Visits   None     Future Appointments             In 1 week Angela Buckler, FNP [facility]            Passed - Cr in normal range and within 360 days    Creat  Date Value Ref Range Status  12/19/2022 0.77 0.50 - 1.05 mg/dL Final         Passed - eGFR is 30 or above and within 360 days    EGFR (African American)  Date Value Ref Range Status  08/12/2011 >60  Final   GFR calc Af Amer  Date Value Ref Range Status  01/01/2018 81 >59 mL/min/1.73 Final   EGFR (Non-African Amer.)  Date Value Ref Range Status  08/12/2011 >60  Final    Comment:    eGFR values <14mL/min/1.73 m2 may be an indication of chronic kidney disease (CKD). Calculated eGFR is useful in patients with stable renal function. The eGFR calculation will not be reliable in acutely ill patients when serum creatinine is changing rapidly. It is not useful in  patients on dialysis. The eGFR calculation may not be applicable to patients at the low and high extremes of body sizes, pregnant women, and vegetarians.    GFR, Estimated  Date Value Ref Range Status  01/31/2021 >60 >60 mL/min Final    Comment:    (NOTE) Calculated using the CKD-EPI Creatinine Equation (2021)    eGFR  Date Value Ref Range Status  12/19/2022 84 > OR = 60 mL/min/1.59m2 Final         Passed - Completed PHQ-2 or PHQ-9 in the last 360 days      Passed - Last BP in normal range    BP Readings from Last 1 Encounters:  12/19/22 112/74          levocetirizine (XYZAL ) 5 MG tablet [Pharmacy Med Name: LEVOCETIRIZINE 5 MG TABLET] 90 tablet 0    Sig:  TAKE 1 TABLET BY MOUTH EVERY DAY IN THE EVENING     Ear, Nose, and Throat:  Antihistamines - levocetirizine dihydrochloride  Failed - 06/12/2023 11:49 AM      Failed - Valid encounter within last 12 months    Recent Outpatient Visits   None     Future Appointments             In 1 week Angela Buckler, FNP  [facility]            Passed - Cr in normal range and within 360 days    Creat  Date Value Ref Range Status  12/19/2022 0.77 0.50 - 1.05 mg/dL Final         Passed - eGFR is 10 or above and within 360 days    EGFR (African American)  Date Value Ref Range Status  08/12/2011 >60  Final   GFR calc Af Amer  Date Value Ref Range Status  01/01/2018 81 >59 mL/min/1.73 Final   EGFR (Non-African Amer.)  Date Value Ref Range Status  08/12/2011 >60  Final    Comment:    eGFR values <54mL/min/1.73 m2 may be an indication of chronic kidney disease (CKD). Calculated eGFR is useful in patients with stable renal function. The eGFR calculation will not be reliable in acutely ill patients when serum creatinine is changing rapidly. It is not useful in  patients on dialysis. The eGFR calculation may not be applicable to patients at the low and high extremes of body sizes, pregnant women, and vegetarians.    GFR, Estimated  Date Value Ref Range Status  01/31/2021 >60 >60 mL/min Final    Comment:    (NOTE) Calculated using the CKD-EPI Creatinine Equation (2021)    eGFR  Date Value Ref Range Status  12/19/2022 84 > OR = 60 mL/min/1.42m2 Final

## 2023-06-18 NOTE — Progress Notes (Signed)
 BP 136/82 (BP Location: Left Arm, Patient Position: Sitting, Cuff Size: Normal)   Pulse 85   Temp 97.9 F (36.6 C) (Oral)   Ht 5\' 2"  (1.575 m)   Wt 124 lb 12.8 oz (56.6 kg)   SpO2 97%   BMI 22.83 kg/m    Subjective:    Patient ID: Angela Espinoza, female    DOB: 10/04/1955, 68 y.o.   MRN: [id number]  HPI: Angela Espinoza is a 68 y.o. female  Chief Complaint  Patient presents with   medical managment    Discussed the use of AI scribe software for clinical note transcription with the patient, who gave verbal consent to proceed.  History of Present Illness Angela Espinoza is a 68 year old female with coronary artery disease and hypertension who presents for a follow-up visit.  She is managing chronic pain syndrome with the assistance of a pain management specialist who recently initiated a new medication regimen. She dislikes the side effects and plans to address this at her next appointment on June 4th. She has been attending physical therapy sessions but experienced leg weakness requiring the use of a cane after one session. Home exercises have been prescribed, and she reports some improvement. Her next therapy session is scheduled for next Tuesday.  She reports a sensation of bloating in her stomach. A colonoscopy is scheduled for June 19th to investigate further. She has a history of a bleeding ulcer and has recently consulted her gastroenterologist.  Her current medications include Cymbalta  30 mg daily, Xyzal  5 mg daily, and Nexlizet  118-10 mg daily. She prefers not to take statins. Her last blood work was in November, and she is due for lab work.  She enjoys going to the gym every other day and plans to continue this routine. She is on [employer] and discusses financial considerations regarding her therapy sessions.         06/19/2023    9:57 AM 12/19/2022   10:57 AM 08/10/2022   11:23 AM  Depression screen PHQ 2/9  Decreased Interest 0 0 0   Down, Depressed, Hopeless 0 0 0  PHQ - 2 Score 0 0 0  Altered sleeping 0 0   Tired, decreased energy 0 0   Change in appetite 0 0   Feeling bad or failure about yourself  0 0   Trouble concentrating 0 0   Moving slowly or fidgety/restless 0 0   Suicidal thoughts 0 0   PHQ-9 Score 0 0   Difficult doing work/chores Not difficult at all         06/19/2023    9:58 AM 12/19/2022   10:58 AM 02/13/2022   10:15 AM  GAD 7 : Generalized Anxiety Score  Nervous, Anxious, on Edge 0 0 0  Control/stop worrying 0 0 0  Worry too much - different things 0 0 0  Trouble relaxing 0 0 0  Restless 0 0 0  Easily annoyed or irritable 0 0 0  Afraid - awful might happen 0 0 0  Total GAD 7 Score 0 0 0  Anxiety Difficulty Not difficult at all       Relevant past medical, surgical, family and social history reviewed and updated as indicated. Interim medical history since our last visit reviewed. Allergies and medications reviewed and updated.  Review of Systems  Constitutional: Negative for fever or weight change.  Respiratory: Negative for cough and shortness of breath.   Cardiovascular: Negative for chest pain or palpitations.  Gastrointestinal: Negative for abdominal pain, no bowel changes.  Musculoskeletal: Negative for gait problem or joint swelling.  Skin: Negative for rash.  Neurological: Negative for dizziness or headache.  No other specific complaints in a complete review of systems (except as listed in HPI above).      Objective:      BP 136/82 (BP Location: Left Arm, Patient Position: Sitting, Cuff Size: Normal)   Pulse 85   Temp 97.9 F (36.6 C) (Oral)   Ht 5\' 2"  (1.575 m)   Wt 124 lb 12.8 oz (56.6 kg)   SpO2 97%   BMI 22.83 kg/m    Wt Readings from Last 3 Encounters:  06/19/23 124 lb 12.8 oz (56.6 kg)  12/19/22 131 lb 1.6 oz (59.5 kg)  08/10/22 124 lb 8 oz (56.5 kg)    Physical Exam Vitals reviewed.  Constitutional:      Appearance: Normal appearance.  HENT:      Head: Normocephalic.  Cardiovascular:     Rate and Rhythm: Normal rate and regular rhythm.  Pulmonary:     Effort: Pulmonary effort is normal.     Breath sounds: Normal breath sounds.  Abdominal:     General: Abdomen is flat. Bowel sounds are normal.     Palpations: Abdomen is soft.     Tenderness: There is no abdominal tenderness.  Musculoskeletal:        General: Normal range of motion.  Skin:    General: Skin is warm and dry.  Neurological:     General: No focal deficit present.     Mental Status: She is alert and oriented to person, place, and time. Mental status is at baseline.  Psychiatric:        Mood and Affect: Mood normal.        Behavior: Behavior normal.        Thought Content: Thought content normal.        Judgment: Judgment normal.        Results for orders placed or performed in visit on 12/19/22  CBC with Differential/Platelet   Collection Time: 12/19/22 11:24 AM  Result Value Ref Range   WBC 7.4 3.8 - 10.8 Thousand/uL   RBC 3.84 3.80 - 5.10 Million/uL   Hemoglobin 12.5 11.7 - 15.5 g/dL   HCT 65.7 84.6 - 96.2 %   MCV 96.9 80.0 - 100.0 fL   MCH 32.6 27.0 - 33.0 pg   MCHC 33.6 32.0 - 36.0 g/dL   RDW 95.2 84.1 - 32.4 %   Platelets 373 140 - 400 Thousand/uL   MPV 9.9 7.5 - 12.5 fL   Neutro Abs 4,943 1,500 - 7,800 cells/uL   Absolute Lymphocytes 1,672 850 - 3,900 cells/uL   Absolute Monocytes 636 200 - 950 cells/uL   Eosinophils Absolute 89 15 - 500 cells/uL   Basophils Absolute 59 0 - 200 cells/uL   Neutrophils Relative % 66.8 %   Total Lymphocyte 22.6 %   Monocytes Relative 8.6 %   Eosinophils Relative 1.2 %   Basophils Relative 0.8 %  COMPLETE METABOLIC PANEL WITH GFR   Collection Time: 12/19/22 11:24 AM  Result Value Ref Range   Glucose, Bld 85 65 - 99 mg/dL   BUN 12 7 - 25 mg/dL   Creat 4.01 0.27 - 2.53 mg/dL   eGFR 84 > OR = 60 GU/YQI/3.47Q2   BUN/Creatinine Ratio SEE NOTE: 6 - 22 (calc)   Sodium 129 (L) 135 - 146 mmol/L   Potassium 4.4  3.5 -  5.3 mmol/L   Chloride 93 (L) 98 - 110 mmol/L   CO2 26 20 - 32 mmol/L   Calcium  9.6 8.6 - 10.4 mg/dL   Total Protein 6.9 6.1 - 8.1 g/dL   Albumin 4.7 3.6 - 5.1 g/dL   Globulin 2.2 1.9 - 3.7 g/dL (calc)   AG Ratio 2.1 1.0 - 2.5 (calc)   Total Bilirubin 0.5 0.2 - 1.2 mg/dL   Alkaline phosphatase (APISO) 40 37 - 153 U/L   AST 28 10 - 35 U/L   ALT 15 6 - 29 U/L  Lipid panel   Collection Time: 12/19/22 11:24 AM  Result Value Ref Range   Cholesterol 172 <200 mg/dL   HDL 78 > OR = 50 mg/dL   Triglycerides 65 <161 mg/dL   LDL Cholesterol (Calc) 80 mg/dL (calc)   Total CHOL/HDL Ratio 2.2 <5.0 (calc)   Non-HDL Cholesterol (Calc) 94 <096 mg/dL (calc)          Assessment & Plan:   Problem List Items Addressed This Visit       Cardiovascular and Mediastinum   Hypertension   Coronary artery disease due to lipid rich plaque - Primary     Respiratory   Asthma without status asthmaticus   Seasonal allergic rhinitis due to pollen     Digestive   Acid reflux (Chronic)   Pyloric stenosis     Musculoskeletal and Integument   Spondylosis without myelopathy or radiculopathy, lumbar region (Chronic)   DDD (degenerative disc disease), lumbosacral (Chronic)     Other   Chronic pain syndrome (Chronic)   Relevant Medications   pregabalin (LYRICA) 75 MG capsule   Chronic low back pain (Left) w/o sciatica (Chronic)   Relevant Medications   pregabalin (LYRICA) 75 MG capsule   Anxiety   History of GI bleed   Hyperlipidemia     Assessment and Plan Assessment & Plan Chronic low back pain Chronic low back pain with degenerative disc disease and trauma. Currently under pain management and physical therapy with reported improvement from exercises. Dissatisfied with side effects of current nerve medication and plans to reduce dosage. - Continue current pain management and physical therapy regimen - Follow up with pain management specialist on June 4 - Reduce nerve medication dosage  as tolerated  Bloating/GERD/pyloric stenosis Reports bloating sensation. Scheduled for colonoscopy on June 19 to evaluate gastrointestinal symptoms. Additional kidney and liver function tests ordered to assess underlying causes. - Proceed with colonoscopy on June 19 - Order kidney and liver function tests   Hypertension Blood pressure is 136/82 mmHg, which is within acceptable range. No longer taking metoprolol .   Hyperlipidemia/CAD Hyperlipidemia managed with Nexlizet . She is not on statins due to preference. Last cholesterol levels were improved. - Order blood work to check cholesterol levels  Anxiety -currently taking duloxetine  30 mg daily  Asthma/allergic rhinitis -continue albuterol as needed, xyzal  daily -stable no changes       Follow up plan: Return in about 6 months (around 12/20/2023) for follow up, needs to schedule awv.

## 2023-06-19 ENCOUNTER — Encounter: Payer: Self-pay | Admitting: Nurse Practitioner

## 2023-06-19 ENCOUNTER — Ambulatory Visit: Payer: Self-pay | Admitting: Nurse Practitioner

## 2023-06-19 VITALS — BP 136/82 | HR 85 | Temp 97.9°F | Ht 62.0 in | Wt 124.8 lb

## 2023-06-19 DIAGNOSIS — J301 Allergic rhinitis due to pollen: Secondary | ICD-10-CM | POA: Diagnosis not present

## 2023-06-19 DIAGNOSIS — G894 Chronic pain syndrome: Secondary | ICD-10-CM

## 2023-06-19 DIAGNOSIS — M47816 Spondylosis without myelopathy or radiculopathy, lumbar region: Secondary | ICD-10-CM | POA: Diagnosis not present

## 2023-06-19 DIAGNOSIS — E782 Mixed hyperlipidemia: Secondary | ICD-10-CM

## 2023-06-19 DIAGNOSIS — I2583 Coronary atherosclerosis due to lipid rich plaque: Secondary | ICD-10-CM | POA: Diagnosis not present

## 2023-06-19 DIAGNOSIS — I10 Essential (primary) hypertension: Secondary | ICD-10-CM | POA: Diagnosis not present

## 2023-06-19 DIAGNOSIS — M5137 Other intervertebral disc degeneration, lumbosacral region with discogenic back pain only: Secondary | ICD-10-CM

## 2023-06-19 DIAGNOSIS — F419 Anxiety disorder, unspecified: Secondary | ICD-10-CM

## 2023-06-19 DIAGNOSIS — K311 Adult hypertrophic pyloric stenosis: Secondary | ICD-10-CM | POA: Diagnosis not present

## 2023-06-19 DIAGNOSIS — I2510 Atherosclerotic heart disease of native coronary artery without angina pectoris: Secondary | ICD-10-CM | POA: Diagnosis not present

## 2023-06-19 DIAGNOSIS — J4520 Mild intermittent asthma, uncomplicated: Secondary | ICD-10-CM

## 2023-06-19 DIAGNOSIS — K219 Gastro-esophageal reflux disease without esophagitis: Secondary | ICD-10-CM | POA: Diagnosis not present

## 2023-06-19 DIAGNOSIS — M545 Low back pain, unspecified: Secondary | ICD-10-CM

## 2023-06-19 DIAGNOSIS — Z8719 Personal history of other diseases of the digestive system: Secondary | ICD-10-CM

## 2023-06-19 LAB — CBC WITH DIFFERENTIAL/PLATELET
Absolute Lymphocytes: 1596 {cells}/uL (ref 850–3900)
Absolute Monocytes: 456 {cells}/uL (ref 200–950)
Basophils Absolute: 68 {cells}/uL (ref 0–200)
Basophils Relative: 1.2 %
Eosinophils Absolute: 91 {cells}/uL (ref 15–500)
Eosinophils Relative: 1.6 %
HCT: 41.5 % (ref 35.0–45.0)
Hemoglobin: 13.6 g/dL (ref 11.7–15.5)
MCH: 31.3 pg (ref 27.0–33.0)
MCHC: 32.8 g/dL (ref 32.0–36.0)
MCV: 95.4 fL (ref 80.0–100.0)
MPV: 10 fL (ref 7.5–12.5)
Monocytes Relative: 8 %
Neutro Abs: 3488 {cells}/uL (ref 1500–7800)
Neutrophils Relative %: 61.2 %
Platelets: 372 10*3/uL (ref 140–400)
RBC: 4.35 10*6/uL (ref 3.80–5.10)
RDW: 11.7 % (ref 11.0–15.0)
Total Lymphocyte: 28 %
WBC: 5.7 10*3/uL (ref 3.8–10.8)

## 2023-06-19 LAB — COMPREHENSIVE METABOLIC PANEL WITH GFR
AG Ratio: 1.9 (calc) (ref 1.0–2.5)
ALT: 15 U/L (ref 6–29)
AST: 26 U/L (ref 10–35)
Albumin: 4.7 g/dL (ref 3.6–5.1)
Alkaline phosphatase (APISO): 47 U/L (ref 37–153)
BUN: 11 mg/dL (ref 7–25)
CO2: 28 mmol/L (ref 20–32)
Calcium: 10.1 mg/dL (ref 8.6–10.4)
Chloride: 98 mmol/L (ref 98–110)
Creat: 0.63 mg/dL (ref 0.50–1.05)
Globulin: 2.5 g/dL (ref 1.9–3.7)
Glucose, Bld: 90 mg/dL (ref 65–99)
Potassium: 4.1 mmol/L (ref 3.5–5.3)
Sodium: 135 mmol/L (ref 135–146)
Total Bilirubin: 0.7 mg/dL (ref 0.2–1.2)
Total Protein: 7.2 g/dL (ref 6.1–8.1)
eGFR: 96 mL/min/{1.73_m2} (ref >=60)

## 2023-06-19 LAB — LIPID PANEL
Cholesterol: 187 mg/dL (ref <200)
HDL: 84 mg/dL (ref >=50)
LDL Cholesterol (Calc): 88 mg/dL
Non-HDL Cholesterol (Calc): 103 mg/dL (ref <130)
Total CHOL/HDL Ratio: 2.2 (calc) (ref <5.0)
Triglycerides: 64 mg/dL (ref <150)

## 2023-06-20 ENCOUNTER — Ambulatory Visit: Payer: Self-pay | Admitting: Nurse Practitioner

## 2023-06-21 ENCOUNTER — Ambulatory Visit: Payer: Self-pay

## 2023-06-21 VITALS — BP 136/82 | Ht 62.0 in | Wt 125.0 lb

## 2023-06-21 DIAGNOSIS — Z0000 Encounter for general adult medical examination without abnormal findings: Secondary | ICD-10-CM

## 2023-06-21 NOTE — Patient Instructions (Signed)
 Angela Espinoza , Thank you for taking time out of your busy schedule to complete your Annual Wellness Visit with me. I enjoyed our conversation and look forward to speaking with you again next year. I, as well as your care team,  appreciate your ongoing commitment to your health goals. Please review the following plan we discussed and let me know if I can assist you in the future. Your Game plan/ To Do List    Referrals: If you haven't heard from the office you've been referred to, please reach out to them at the phone provided.  None at this time  Follow up Visits: Next Medicare AWV with our clinical staff: patient declined   Have you seen your provider in the last 6 months (3 months if uncontrolled diabetes)? Yes Next Office Visit with your provider: 12/23/2023  Clinician Recommendations:  Aim for 30 minutes of exercise or brisk walking, 6-8 glasses of water , and 5 servings of fruits and vegetables each day.       This is a list of the screening recommended for you and due dates:  Health Maintenance  Topic Date Due   Zoster (Shingles) Vaccine (1 of 2) Never done   Pneumonia Vaccine (2 of 2 - PCV) 09/12/2019   COVID-19 Vaccine (2 - Moderna risk series) 12/06/2020   Flu Shot  08/30/2023   Mammogram  12/13/2023   Colon Cancer Screening  01/13/2024   Medicare Annual Wellness Visit  06/20/2024   DEXA scan (bone density measurement)  Completed   Hepatitis C Screening  Completed   HPV Vaccine  Aged Out   Meningitis B Vaccine  Aged Out   DTaP/Tdap/Td vaccine  Discontinued    Advanced directives: (Declined) Advance directive discussed with you today. Even though you declined this today, please call our office should you change your mind, and we can give you the proper paperwork for you to fill out. Advance Care Planning is important because it:  [x]  Makes sure you receive the medical care that is consistent with your values, goals, and preferences  [x]  It provides guidance to your family  and loved ones and reduces their decisional burden about whether or not they are making the right decisions based on your wishes.  Follow the link provided in your after visit summary or read over the paperwork we have mailed to you to help you started getting your Advance Directives in place. If you need assistance in completing these, please reach out to us  so that we can help you!  See attachments for Preventive Care and Fall Prevention Tips.

## 2023-06-21 NOTE — Progress Notes (Signed)
 Because this visit was a virtual/telehealth visit,  certain criteria was not obtained, such a blood pressure, CBG if applicable, and timed get up and go. Any medications not marked as "taking" were not mentioned during the medication reconciliation part of the visit. Any vitals not documented were not able to be obtained due to this being a telehealth visit or patient was unable to self-report a recent blood pressure reading due to a lack of equipment at home via telehealth. Vitals that have been documented are verbally provided by the patient.  This visit was performed by a medical professional under my direct supervision. I was immediately available for consultation/collaboration. I have reviewed and agree with the Annual Wellness Visit documentation.  Subjective:   Angela Espinoza is a 68 y.o. who presents for a Medicare Wellness preventive visit.  As a reminder, Annual Wellness Visits don't include a physical exam, and some assessments may be limited, especially if this visit is performed virtually. We may recommend an in-person follow-up visit with your provider if needed.  Visit Complete: Virtual I connected with  Angela Espinoza on 06/21/23 by a audio enabled telemedicine application and verified that I am speaking with the correct person using two identifiers.  Patient Location: Home  Provider Location: Home Office  I discussed the limitations of evaluation and management by telemedicine. The patient expressed understanding and agreed to proceed.  Vital Signs: Because this visit was a virtual/telehealth visit, some criteria may be missing or patient reported. Any vitals not documented were not able to be obtained and vitals that have been documented are patient reported.  VideoDeclined- This patient declined Librarian, academic. Therefore the visit was completed with audio only.  Persons Participating in Visit: Patient.  AWV Questionnaire: Yes:  Patient Medicare AWV questionnaire was completed by the patient on 06/14/2023; I have confirmed that all information answered by patient is correct and no changes since this date.  Cardiac Risk Factors include: advanced age (>77men, >46 women)     Objective:     Today's Vitals   06/21/23 1031  BP: 136/82  Weight: 125 lb (56.7 kg)  Height: 5\' 2"  (1.575 m)   Body mass index is 22.86 kg/m.     06/21/2023   10:30 AM 01/11/2022   12:56 PM 11/23/2021    9:31 AM 11/07/2021    7:37 AM 10/31/2021    1:15 PM 10/24/2021   11:38 AM 08/24/2021   11:03 AM  Advanced Directives  Does Patient Have a Medical Advance Directive? No No No No No No No  Would patient like information on creating a medical advance directive? No - Patient declined  No - Patient declined No - Patient declined  Yes (MAU/Ambulatory/Procedural Areas - Information given) No - Patient declined    Current Medications (verified) Outpatient Encounter Medications as of 06/21/2023  Medication Sig   b complex vitamins capsule Take 1 capsule by mouth daily.   BLACK ELDERBERRY PO Take 1 capsule by mouth daily.    Black Pepper-Turmeric 3-500 MG CAPS Take by mouth.   cholecalciferol (VITAMIN D3) 25 MCG (1000 UNIT) tablet Take 1,000 Units by mouth daily.   co-enzyme Q-10 30 MG capsule Take 30 mg by mouth daily.   DULoxetine  (CYMBALTA ) 30 MG capsule TAKE 1 CAPSULE BY MOUTH EVERY DAY   GARLIC PO Take by mouth.   levocetirizine (XYZAL ) 5 MG tablet TAKE 1 TABLET BY MOUTH EVERY DAY IN THE EVENING   Magnesium  Glycinate 120 MG CAPS Take 240  mg by mouth daily.   Multiple Vitamin (MULTIVITAMIN) capsule Take 1 capsule by mouth daily.   NEXLIZET  180-10 MG TABS TAKE 1 TABLET BY MOUTH EVERY DAY   Omega-3 Fatty Acids (FISH OIL PO) Take 1 tablet by mouth daily.   pregabalin (LYRICA) 75 MG capsule Take 75 mg by mouth daily.   vitamin C (ASCORBIC ACID) 500 MG tablet Take 1,000 mg by mouth daily.    No facility-administered encounter medications on  file as of 06/21/2023.    Allergies (verified) Mobic [meloxicam] and Indapamide   History: Past Medical History:  Diagnosis Date   Allergy    Anemia    Anxiety    Asthma    NO INHALER-SMOKE AND PERFUME TRIGGERS ASTHMA   Blood transfusion without reported diagnosis    Cataract    Depression    GERD (gastroesophageal reflux disease)    Hyperlipidemia    Hypertension    IBS (irritable bowel syndrome)    Neuromuscular disorder (HCC)    Ulcer    Past Surgical History:  Procedure Laterality Date   ABDOMINAL HYSTERECTOMY  age 12   AXILLARY LYMPH NODE BIOPSY Right 08/03/2014   Procedure: EXCISION RIGHT  AXILLARY LIPOMATOSIS;  Surgeon: Benancio Bracket, MD;  Location: [facility];  Service: General;  Laterality: Right;   BREAST BIOPSY Left 10/07/2018   Stereo, Coil Clip, BENIGN BREAST TISSUE WITH AREAS CONTAINING A PREDOMINANCE OF ADIPOSE TISSUE   BREAST BIOPSY Left 10/21/2019   benign   CATARACT EXTRACTION W/PHACO Left 10/24/2021   Procedure: CATARACT EXTRACTION PHACO AND INTRAOCULAR LENS PLACEMENT (IOC) LEFT VIVITY TORIC LENS 7.91 00:41.6;  Surgeon: Clair Crews, MD;  Location: [facility];  Service: Ophthalmology;  Laterality: Left;   CATARACT EXTRACTION W/PHACO Right 11/07/2021   Procedure: CATARACT EXTRACTION PHACO AND INTRAOCULAR LENS PLACEMENT (IOC) RIGHT VIVITY TORIC  LENS;  Surgeon: Clair Crews, MD;  Location: [facility];  Service: Ophthalmology;  Laterality: Right;  6.89 0:43.9   COLONOSCOPY     DIAGNOSTIC LAPAROSCOPY     ESOPHAGOGASTRODUODENOSCOPY (EGD) WITH PROPOFOL  N/A 01/26/2021   Procedure: ESOPHAGOGASTRODUODENOSCOPY (EGD) WITH PROPOFOL ;  Surgeon: Shane Darling, MD;  Location: [facility];  Service: Endoscopy;  Laterality: N/A;   ESOPHAGOGASTRODUODENOSCOPY (EGD) WITH PROPOFOL  N/A 04/17/2021   Procedure: ESOPHAGOGASTRODUODENOSCOPY (EGD) WITH PROPOFOL ;  Surgeon: Shane Darling, MD;  Location: [facility];  Service: Endoscopy;   Laterality: N/A;   ESOPHAGOGASTRODUODENOSCOPY (EGD) WITH PROPOFOL  N/A 05/02/2021   Procedure: ESOPHAGOGASTRODUODENOSCOPY (EGD) WITH PROPOFOL ;  Surgeon: Shane Darling, MD;  Location: [facility];  Service: Endoscopy;  Laterality: N/A;   ESOPHAGOGASTRODUODENOSCOPY (EGD) WITH PROPOFOL  N/A 06/06/2021   Procedure: ESOPHAGOGASTRODUODENOSCOPY (EGD) WITH PROPOFOL ;  Surgeon: Shane Darling, MD;  Location: [facility];  Service: Endoscopy;  Laterality: N/A;   EXPLORATORY LAPAROTOMY     EYE SURGERY  Sept. Oct.   FOOT SURGERY     KYPHOPLASTY N/A 12/10/2014   Procedure: KYPHOPLASTY L 1;  Surgeon: Molli Angelucci, MD;  Location: [facility];  Service: Orthopedics;  Laterality: N/A;   OOPHORECTOMY     TONSILLECTOMY     Family History  Problem Relation Age of Onset   Heart disease Mother    Heart attack Mother    Cancer Father    Lung cancer Father    Breast cancer Neg Hx    Social History   Socioeconomic History   Marital status: Married    Spouse name: Not on file   Number of children: Not on file   Years of education:  Not on file   Highest education level: 12th grade  Occupational History   Not on file  Tobacco Use   Smoking status: Never    Passive exposure: Yes   Smokeless tobacco: Never   Tobacco comments:    Parents were smokers   Vaping Use   Vaping status: Never Used  Substance and Sexual Activity   Alcohol use: Yes    Comment: red wine occasionally   Drug use: No   Sexual activity: Not on file  Other Topics Concern   Not on file  Social History Narrative   Not on file   Social Drivers of Health   Financial Resource Strain: Low Risk  (06/14/2023)   Overall Financial Resource Strain (CARDIA)    Difficulty of Paying Living Expenses: Not hard at all  Food Insecurity: No Food Insecurity (06/14/2023)   Hunger Vital Sign    Worried About Running Out of Food in the Last Year: Never true    Ran Out of Food in the Last Year: Never true  Transportation Needs: No  Transportation Needs (06/14/2023)   PRAPARE - Administrator, Civil Service (Medical): No    Lack of Transportation (Non-Medical): No  Physical Activity: Sufficiently Active (06/14/2023)   Exercise Vital Sign    Days of Exercise per Week: 3 days    Minutes of Exercise per Session: 50 min  Stress: No Stress Concern Present (06/14/2023)   [name] of Occupational Health - Occupational Stress Questionnaire    Feeling of Stress : Not at all  Social Connections: Socially Integrated (06/14/2023)   Social Connection and Isolation Panel [NHANES]    Frequency of Communication with Friends and Family: More than three times a week    Frequency of Social Gatherings with Friends and Family: Once a week    Attends Religious Services: More than 4 times per year    Active Member of [employer] or Organizations: Yes    Attends Engineer, structural: More than 4 times per year    Marital Status: Married    Tobacco Counseling Counseling given: Not Answered Tobacco comments: Parents were smokers     Clinical Intake:  Pre-visit preparation completed: Yes  Pain : No/denies pain     BMI - recorded: 22.86 Nutritional Status: BMI of 19-24  Normal Nutritional Risks: None Diabetes: No  Lab Results  Component Value Date   HGBA1C 5.1 06/15/2021   HGBA1C 5.2 01/30/2021     How often do you need to have someone help you when you read instructions, pamphlets, or other written materials from your doctor or pharmacy?: 1 - Never What is the last grade level you completed in school?: 12th Grade     Information entered by :: [employer]   Activities of Daily Living     06/21/2023   10:35 AM 06/14/2023    5:21 PM  In your present state of health, do you have any difficulty performing the following activities:  Hearing? 0 0  Vision? 0 0  Difficulty concentrating or making decisions? 0 0  Walking or climbing stairs? 0 0  Dressing or bathing? 0 0  Doing errands,  shopping? 0 0  Preparing Food and eating ? N N  Using the Toilet? N N  In the past six months, have you accidently leaked urine? N N  Do you have problems with loss of bowel control? N N  Managing your Medications? N N  Managing your Finances? N N  Housekeeping or managing  your Housekeeping? N N    Patient Care Team: Quinton Buckler, FNP as PCP - General (Nurse Practitioner) Burnie Cartwright, RN as Registered Nurse Arlette Benders, RN (Inactive) as Registered Nurse  Indicate any recent Medical Services you may have received from other than [facility] providers in the past year (date may be approximate).     Assessment:    This is a routine wellness examination for Angela Espinoza.  Hearing/Vision screen Hearing Screening - Comments:: No hearing difficulties  Vision Screening - Comments:: Patient has had eye surgery. She goes to [facility] and up to date   Goals Addressed             This Visit's Progress    Patient Stated       To keeping going to the gyn       Depression Screen     06/21/2023   10:35 AM 06/19/2023    9:57 AM 12/19/2022   10:57 AM 08/10/2022   11:23 AM 06/18/2022    9:46 AM 02/13/2022   10:01 AM 01/11/2022   12:55 PM  PHQ 2/9 Scores  PHQ - 2 Score 0 0 0 0 0 0 0  PHQ- 9 Score 0 0 0   0     Fall Risk     06/21/2023   10:34 AM 06/19/2023    9:57 AM 06/14/2023    5:21 PM 12/19/2022   10:57 AM 08/10/2022   11:23 AM  Fall Risk   Falls in the past year? 0 0 0 0 0  Number falls in past yr: 0 0   0  Injury with Fall? 0 0   0  Risk for fall due to : No Fall Risks No Fall Risks  No Fall Risks   Follow up Falls evaluation completed Falls evaluation completed  Falls prevention discussed     MEDICARE RISK AT HOME:  Medicare Risk at Home Any stairs in or around the home?: (Patient-Rptd) No If so, are there any without handrails?: No Home free of loose throw rugs in walkways, pet beds, electrical cords, etc?: (Patient-Rptd) Yes Adequate lighting in your home to  reduce risk of falls?: (Patient-Rptd) Yes Life alert?: (Patient-Rptd) No Use of a cane, walker or w/c?: (Patient-Rptd) No Grab bars in the bathroom?: (Patient-Rptd) No Shower chair or bench in shower?: (Patient-Rptd) No Elevated toilet seat or a handicapped toilet?: (Patient-Rptd) No  TIMED UP AND GO:  Was the test performed?  No  Cognitive Function: 6CIT completed        06/21/2023   10:32 AM 02/13/2022   10:11 AM  6CIT Screen  What Year? 0 points 0 points  What month? 0 points 0 points  What time? 0 points 0 points  Count back from 20 0 points 0 points  Months in reverse 0 points 0 points  Repeat phrase 0 points 0 points  Total Score 0 points 0 points    Immunizations Immunization History  Administered Date(s) Administered   Influenza Split 10/25/2014   Influenza-Unspecified 11/08/2020   Moderna SARS-COV2 Booster Vaccination 03/20/2019, 12/07/2019, 11/08/2020   Moderna Sars-Covid-2 Vaccination 02/19/2019   Pneumococcal Polysaccharide-23 09/12/2018   Td 04/10/2010    Screening Tests Health Maintenance  Topic Date Due   Zoster Vaccines- Shingrix (1 of 2) Never done   Pneumonia Vaccine 89+ Years old (2 of 2 - PCV) 09/12/2019   COVID-19 Vaccine (2 - Moderna risk series) 12/06/2020   INFLUENZA VACCINE  08/30/2023   MAMMOGRAM  12/13/2023  Colonoscopy  01/13/2024   Medicare Annual Wellness (AWV)  06/20/2024   DEXA SCAN  Completed   Hepatitis C Screening  Completed   HPV VACCINES  Aged Out   Meningococcal B Vaccine  Aged Out   DTaP/Tdap/Td  Discontinued    Health Maintenance  Health Maintenance Due  Topic Date Due   Zoster Vaccines- Shingrix (1 of 2) Never done   Pneumonia Vaccine 44+ Years old (2 of 2 - PCV) 09/12/2019   COVID-19 Vaccine (2 - Moderna risk series) 12/06/2020   Health Maintenance Items Addressed:patient declined vaccinations   Additional Screening:  Vision Screening: Recommended annual ophthalmology exams for early detection of glaucoma and  other disorders of the eye.  Dental Screening: Recommended annual dental exams for proper oral hygiene  Community Resource Referral / Chronic Care Management: CRR required this visit?  No   CCM required this visit?  No   Plan:    I have personally reviewed and noted the following in the patient's chart:   Medical and social history Use of alcohol, tobacco or illicit drugs  Current medications and supplements including opioid prescriptions. Patient is not currently taking opioid prescriptions. Functional ability and status Nutritional status Physical activity Advanced directives List of other physicians Hospitalizations, surgeries, and ER visits in previous 12 months Vitals Screenings to include cognitive, depression, and falls Referrals and appointments  In addition, I have reviewed and discussed with patient certain preventive protocols, quality metrics, and best practice recommendations. A written personalized care plan for preventive services as well as general preventive health recommendations were provided to patient.   Angela Espinoza, [place]   06/21/2023   After Visit Summary: (Declined) Due to this being a telephonic visit, with patients personalized plan was offered to patient but patient Declined AVS at this time   Notes: Nothing significant to report at this time.

## 2023-06-26 DIAGNOSIS — M545 Low back pain, unspecified: Secondary | ICD-10-CM | POA: Diagnosis not present

## 2023-06-26 DIAGNOSIS — M79669 Pain in unspecified lower leg: Secondary | ICD-10-CM | POA: Diagnosis not present

## 2023-06-26 DIAGNOSIS — R2681 Unsteadiness on feet: Secondary | ICD-10-CM | POA: Diagnosis not present

## 2023-06-26 DIAGNOSIS — M25579 Pain in unspecified ankle and joints of unspecified foot: Secondary | ICD-10-CM | POA: Diagnosis not present

## 2023-07-02 DIAGNOSIS — M25579 Pain in unspecified ankle and joints of unspecified foot: Secondary | ICD-10-CM | POA: Diagnosis not present

## 2023-07-02 DIAGNOSIS — M79669 Pain in unspecified lower leg: Secondary | ICD-10-CM | POA: Diagnosis not present

## 2023-07-02 DIAGNOSIS — R2681 Unsteadiness on feet: Secondary | ICD-10-CM | POA: Diagnosis not present

## 2023-07-02 DIAGNOSIS — M545 Low back pain, unspecified: Secondary | ICD-10-CM | POA: Diagnosis not present

## 2023-07-03 DIAGNOSIS — M792 Neuralgia and neuritis, unspecified: Secondary | ICD-10-CM | POA: Diagnosis not present

## 2023-07-04 DIAGNOSIS — R2681 Unsteadiness on feet: Secondary | ICD-10-CM | POA: Diagnosis not present

## 2023-07-04 DIAGNOSIS — M25579 Pain in unspecified ankle and joints of unspecified foot: Secondary | ICD-10-CM | POA: Diagnosis not present

## 2023-07-04 DIAGNOSIS — M545 Low back pain, unspecified: Secondary | ICD-10-CM | POA: Diagnosis not present

## 2023-07-04 DIAGNOSIS — M79669 Pain in unspecified lower leg: Secondary | ICD-10-CM | POA: Diagnosis not present

## 2023-07-09 DIAGNOSIS — R2681 Unsteadiness on feet: Secondary | ICD-10-CM | POA: Diagnosis not present

## 2023-07-09 DIAGNOSIS — M545 Low back pain, unspecified: Secondary | ICD-10-CM | POA: Diagnosis not present

## 2023-07-09 DIAGNOSIS — M25579 Pain in unspecified ankle and joints of unspecified foot: Secondary | ICD-10-CM | POA: Diagnosis not present

## 2023-07-09 DIAGNOSIS — M79669 Pain in unspecified lower leg: Secondary | ICD-10-CM | POA: Diagnosis not present

## 2023-07-13 ENCOUNTER — Other Ambulatory Visit: Payer: Self-pay | Admitting: Nurse Practitioner

## 2023-07-13 DIAGNOSIS — I2510 Atherosclerotic heart disease of native coronary artery without angina pectoris: Secondary | ICD-10-CM

## 2023-07-13 DIAGNOSIS — E785 Hyperlipidemia, unspecified: Secondary | ICD-10-CM

## 2023-07-16 DIAGNOSIS — R2681 Unsteadiness on feet: Secondary | ICD-10-CM | POA: Diagnosis not present

## 2023-07-16 DIAGNOSIS — M25579 Pain in unspecified ankle and joints of unspecified foot: Secondary | ICD-10-CM | POA: Diagnosis not present

## 2023-07-16 DIAGNOSIS — M545 Low back pain, unspecified: Secondary | ICD-10-CM | POA: Diagnosis not present

## 2023-07-16 DIAGNOSIS — M79669 Pain in unspecified lower leg: Secondary | ICD-10-CM | POA: Diagnosis not present

## 2023-07-16 NOTE — Telephone Encounter (Signed)
 Requested medications are due for refill today.  yes  Requested medications are on the active medications list.  yes  Last refill. 01/08/2023 #30 5 rf  Future visit scheduled.   yes  Notes to clinic.  Medication not assigned to a protocol. Please review for refill.    Requested Prescriptions  Pending Prescriptions Disp Refills   NEXLIZET  180-10 MG TABS [Pharmacy Med Name: NEXLIZET  180-10 MG TABLET] 30 tablet 5    Sig: TAKE 1 TABLET BY MOUTH EVERY DAY     Off-Protocol Failed - 07/16/2023 12:25 PM      Failed - Medication not assigned to a protocol, review manually.      Passed - Valid encounter within last 12 months    Recent Outpatient Visits           3 weeks ago Coronary artery disease due to lipid rich plaque   [facility], Monalisa Angles, [place]

## 2023-07-18 DIAGNOSIS — M545 Low back pain, unspecified: Secondary | ICD-10-CM | POA: Diagnosis not present

## 2023-07-18 DIAGNOSIS — R2681 Unsteadiness on feet: Secondary | ICD-10-CM | POA: Diagnosis not present

## 2023-07-18 DIAGNOSIS — M79669 Pain in unspecified lower leg: Secondary | ICD-10-CM | POA: Diagnosis not present

## 2023-07-18 DIAGNOSIS — M25579 Pain in unspecified ankle and joints of unspecified foot: Secondary | ICD-10-CM | POA: Diagnosis not present

## 2023-07-23 DIAGNOSIS — M545 Low back pain, unspecified: Secondary | ICD-10-CM | POA: Diagnosis not present

## 2023-07-23 DIAGNOSIS — M25579 Pain in unspecified ankle and joints of unspecified foot: Secondary | ICD-10-CM | POA: Diagnosis not present

## 2023-07-23 DIAGNOSIS — R2681 Unsteadiness on feet: Secondary | ICD-10-CM | POA: Diagnosis not present

## 2023-07-23 DIAGNOSIS — M79669 Pain in unspecified lower leg: Secondary | ICD-10-CM | POA: Diagnosis not present

## 2023-07-25 DIAGNOSIS — M79669 Pain in unspecified lower leg: Secondary | ICD-10-CM | POA: Diagnosis not present

## 2023-07-25 DIAGNOSIS — R2681 Unsteadiness on feet: Secondary | ICD-10-CM | POA: Diagnosis not present

## 2023-07-25 DIAGNOSIS — M25579 Pain in unspecified ankle and joints of unspecified foot: Secondary | ICD-10-CM | POA: Diagnosis not present

## 2023-07-25 DIAGNOSIS — M545 Low back pain, unspecified: Secondary | ICD-10-CM | POA: Diagnosis not present

## 2023-07-29 ENCOUNTER — Other Ambulatory Visit: Payer: Self-pay

## 2023-07-29 ENCOUNTER — Encounter
Admission: RE | Disposition: A | Discharge status: Home / Self Care | Payer: Self-pay | Source: Home / Self Care | Attending: Gastroenterology

## 2023-07-29 ENCOUNTER — Ambulatory Visit
Admission: RE | Admit: 2023-07-29 | Discharge: 2023-07-29 | Disposition: A | Discharge status: Home / Self Care | Attending: Gastroenterology | Admitting: Gastroenterology

## 2023-07-29 ENCOUNTER — Ambulatory Visit: Admitting: Anesthesiology

## 2023-07-29 DIAGNOSIS — I10 Essential (primary) hypertension: Secondary | ICD-10-CM | POA: Diagnosis not present

## 2023-07-29 DIAGNOSIS — K5730 Diverticulosis of large intestine without perforation or abscess without bleeding: Secondary | ICD-10-CM | POA: Insufficient documentation

## 2023-07-29 DIAGNOSIS — K640 First degree hemorrhoids: Secondary | ICD-10-CM | POA: Insufficient documentation

## 2023-07-29 DIAGNOSIS — Z1211 Encounter for screening for malignant neoplasm of colon: Secondary | ICD-10-CM | POA: Diagnosis not present

## 2023-07-29 DIAGNOSIS — J45909 Unspecified asthma, uncomplicated: Secondary | ICD-10-CM | POA: Insufficient documentation

## 2023-07-29 DIAGNOSIS — G8929 Other chronic pain: Secondary | ICD-10-CM | POA: Diagnosis not present

## 2023-07-29 DIAGNOSIS — F32A Depression, unspecified: Secondary | ICD-10-CM | POA: Diagnosis not present

## 2023-07-29 DIAGNOSIS — Z8711 Personal history of peptic ulcer disease: Secondary | ICD-10-CM | POA: Insufficient documentation

## 2023-07-29 DIAGNOSIS — K219 Gastro-esophageal reflux disease without esophagitis: Secondary | ICD-10-CM | POA: Insufficient documentation

## 2023-07-29 DIAGNOSIS — I2510 Atherosclerotic heart disease of native coronary artery without angina pectoris: Secondary | ICD-10-CM | POA: Diagnosis not present

## 2023-07-29 DIAGNOSIS — F419 Anxiety disorder, unspecified: Secondary | ICD-10-CM | POA: Insufficient documentation

## 2023-07-29 DIAGNOSIS — K649 Unspecified hemorrhoids: Secondary | ICD-10-CM | POA: Diagnosis not present

## 2023-07-29 HISTORY — DX: Gastric ulcer, unspecified as acute or chronic, without hemorrhage or perforation: K25.9

## 2023-07-29 HISTORY — PX: COLONOSCOPY: SHX5424

## 2023-07-29 SURGERY — COLONOSCOPY
Anesthesia: General

## 2023-07-29 MED ORDER — PROPOFOL 10 MG/ML IV BOLUS
INTRAVENOUS | Status: DC | PRN
  Administered 2023-07-29: 50 mg via INTRAVENOUS

## 2023-07-29 MED ORDER — PROPOFOL 500 MG/50ML IV EMUL
INTRAVENOUS | Status: DC | PRN
  Administered 2023-07-29: 75 ug/kg/min via INTRAVENOUS

## 2023-07-29 MED ORDER — DEXMEDETOMIDINE HCL IN NACL 80 MCG/20ML IV SOLN
INTRAVENOUS | Status: DC | PRN
  Administered 2023-07-29: 12 ug via INTRAVENOUS

## 2023-07-29 MED ORDER — SODIUM CHLORIDE 0.9 % IV SOLN: INTRAVENOUS | Status: DC

## 2023-07-29 MED ORDER — LIDOCAINE HCL (CARDIAC) PF 100 MG/5ML IV SOSY
PREFILLED_SYRINGE | INTRAVENOUS | Status: DC | PRN
  Administered 2023-07-29: 60 mg via INTRAVENOUS

## 2023-07-29 NOTE — Interval H&P Note (Signed)
 History and Physical Interval Note:  07/29/2023 10:51 AM  Angela Espinoza  has presented today for surgery, with the diagnosis of screening.  The various methods of treatment have been discussed with the patient and family. After consideration of risks, benefits and other options for treatment, the patient has consented to  Procedure(s): COLONOSCOPY (N/A) as a surgical intervention.  The patient's history has been reviewed, patient examined, no change in status, stable for surgery.  I have reviewed the patient's chart and labs.  Questions were answered to the patient's satisfaction.     Ole ONEIDA Schick  Ok to proceed with colonoscopy

## 2023-07-29 NOTE — Transfer of Care (Signed)
 Immediate Anesthesia Transfer of Care Note  Patient: Angela Espinoza  Procedure(s) Performed: COLONOSCOPY  Patient Location: PACU  Anesthesia Type:General  Level of Consciousness: sedated  Airway & Oxygen Therapy: Patient Spontanous Breathing  Post-op Assessment: Report given to RN and Post -op Vital signs reviewed and stable  Post vital signs: Reviewed and stable  Last Vitals:  Vitals Value Taken Time  BP    Temp    Pulse    Resp 12 07/29/23 11:21  SpO2    Vitals shown include unfiled device data.  Last Pain:  Vitals:   07/29/23 1021  TempSrc: Temporal  PainSc: 0-No pain         Complications: No notable events documented.

## 2023-07-29 NOTE — Op Note (Signed)
 [facility] Patient Name: Rachell Druckenmiller Procedure Date: 07/29/2023 10:45 AM MRN: [id number] Account #: [id number] Date of Birth: 1955-04-17 Admit Type: Outpatient Age: 68 Room: [facility] ENDO ROOM 3 Gender: Female Note Status: Finalized Instrument Name: Peds Colonoscope [id number] Procedure:             Colonoscopy Indications:           Screening for colorectal malignant neoplasm Providers:             Ole Schick MD, MD Referring MD:          Mliss FALCON. Gareth (Referring MD) Medicines:             Monitored Anesthesia Care Complications:         No immediate complications. Estimated blood loss:                         Minimal. Procedure:             Pre-Anesthesia Assessment:                        - Prior to the procedure, a History and Physical was                         performed, and patient medications and allergies were                         reviewed. The patient is competent. The risks and                         benefits of the procedure and the sedation options and                         risks were discussed with the patient. All questions                         were answered and informed consent was obtained.                         Patient identification and proposed procedure were                         verified by the physician, the nurse, the                         anesthesiologist, the anesthetist and the technician                         in the endoscopy suite. Mental Status Examination:                         alert and oriented. Airway Examination: normal                         oropharyngeal airway and neck mobility. Respiratory                         Examination: clear to auscultation. CV Examination:  normal. Prophylactic Antibiotics: The patient does not                         require prophylactic antibiotics. Prior                         Anticoagulants: The patient has taken no anticoagulant                          or antiplatelet agents. ASA Grade Assessment: III - A                         patient with severe systemic disease. After reviewing                         the risks and benefits, the patient was deemed in                         satisfactory condition to undergo the procedure. The                         anesthesia plan was to use monitored anesthesia care                         (MAC). Immediately prior to administration of                         medications, the patient was re-assessed for adequacy                         to receive sedatives. The heart rate, respiratory                         rate, oxygen saturations, blood pressure, adequacy of                         pulmonary ventilation, and response to care were                         monitored throughout the procedure. The physical                         status of the patient was re-assessed after the                         procedure.                        After obtaining informed consent, the colonoscope was                         passed under direct vision. Throughout the procedure,                         the patient's blood pressure, pulse, and oxygen                         saturations were monitored continuously. The  Colonoscope was introduced through the anus and                         advanced to the the terminal ileum, with                         identification of the appendiceal orifice and IC                         valve. The colonoscopy was performed without                         difficulty. The patient tolerated the procedure well.                         The quality of the bowel preparation was excellent.                         The terminal ileum, ileocecal valve, appendiceal                         orifice, and rectum were photographed. Findings:      The perianal and digital rectal examinations were normal.      The terminal ileum appeared normal.       Scattered large-mouthed and small-mouthed diverticula were found in the       sigmoid colon, descending colon and transverse colon.      Internal hemorrhoids were found during retroflexion. The hemorrhoids       were Grade I (internal hemorrhoids that do not prolapse).      The exam was otherwise without abnormality on direct and retroflexion       views. Impression:            - The examined portion of the ileum was normal.                        - Diverticulosis in the sigmoid colon, in the                         descending colon and in the transverse colon.                        - Internal hemorrhoids.                        - The examination was otherwise normal on direct and                         retroflexion views.                        - No specimens collected. Recommendation:        - Discharge patient to home.                        - Resume previous diet.                        - Continue present medications.                        -  Repeat colonoscopy in 10 years for screening                         purposes.                        - Return to referring physician as previously                         scheduled. Procedure Code(s):     --- Professional ---                        H9878, Colorectal cancer screening; colonoscopy on                         individual not meeting criteria for high risk Diagnosis Code(s):     --- Professional ---                        Z12.11, Encounter for screening for malignant neoplasm                         of colon                        K64.0, First degree hemorrhoids                        K57.30, Diverticulosis of large intestine without                         perforation or abscess without bleeding CPT copyright 2022 American Medical Association. All rights reserved. The codes documented in this report are preliminary and upon coder review may  be revised to meet current compliance requirements. Ole Schick MD, MD 07/29/2023  11:21:58 AM Number of Addenda: 0 Note Initiated On: 07/29/2023 10:45 AM Scope Withdrawal Time: 0 hours 7 minutes 57 seconds  Total Procedure Duration: 0 hours 11 minutes 11 seconds  Estimated Blood Loss:  Estimated blood loss: none. Estimated blood loss was                         minimal.      [facility]

## 2023-07-29 NOTE — H&P (Signed)
 Outpatient short stay form Pre-procedure 07/29/2023  Ole ONEIDA Schick, MD  Primary Physician: Gareth Mliss FALCON, FNP  Reason for visit:  Screening  History of present illness:    68 y/o lady with history of hypertension, HLD, and asthma here for screening colonoscopy. No blood thinners. No family history of GI malignancies. History of hysterectomy.    Current Facility-Administered Medications:    0.9 %  sodium chloride  infusion, , Intravenous, Continuous, Martel Galvan, Ole ONEIDA, MD, Last Rate: 20 mL/hr at 07/29/23 1039, Continued from Pre-op at 07/29/23 1039  Medications Prior to Admission  Medication Sig Dispense Refill Last Dose/Taking   b complex vitamins capsule Take 1 capsule by mouth daily.   Past Week   BLACK ELDERBERRY PO Take 1 capsule by mouth daily.    Past Week   Black Pepper-Turmeric 3-500 MG CAPS Take by mouth.   Past Week   cholecalciferol (VITAMIN D3) 25 MCG (1000 UNIT) tablet Take 1,000 Units by mouth daily.   Past Week   co-enzyme Q-10 30 MG capsule Take 30 mg by mouth daily.   Past Week   GARLIC PO Take by mouth.   Past Week   levocetirizine (XYZAL ) 5 MG tablet TAKE 1 TABLET BY MOUTH EVERY DAY IN THE EVENING 90 tablet 0 Past Week   Multiple Vitamin (MULTIVITAMIN) capsule Take 1 capsule by mouth daily.   Past Week   NEXLIZET  180-10 MG TABS TAKE 1 TABLET BY MOUTH EVERY DAY 30 tablet 5 07/28/2023   Omega-3 Fatty Acids (FISH OIL PO) Take 1 tablet by mouth daily.   Past Week   vitamin C (ASCORBIC ACID) 500 MG tablet Take 1,000 mg by mouth daily.    Past Week   DULoxetine  (CYMBALTA ) 30 MG capsule TAKE 1 CAPSULE BY MOUTH EVERY DAY 90 capsule 0    Magnesium  Glycinate 120 MG CAPS Take 240 mg by mouth daily.      pregabalin (LYRICA) 75 MG capsule Take 75 mg by mouth daily.        Allergies  Allergen Reactions   Mobic [Meloxicam]     Bleeding Stomach ulcer   Indapamide Other (See Comments)    tingling all over body     Past Medical History:  Diagnosis Date   Allergy     Anemia    Anxiety    Asthma    NO INHALER-SMOKE AND PERFUME TRIGGERS ASTHMA   Blood transfusion without reported diagnosis    Cataract    Depression    Gastric ulcer    GERD (gastroesophageal reflux disease)    Hyperlipidemia    Hypertension    IBS (irritable bowel syndrome)    Neuromuscular disorder (HCC)    Ulcer     Review of systems:  Otherwise negative.    Physical Exam  Gen: Alert, oriented. Appears stated age.  HEENT: PERRLA. Lungs: No respiratory distress CV: RRR Abd: soft, benign, no masses Ext: No edema    Planned procedures: Proceed with colonoscopy. The patient understands the nature of the planned procedure, indications, risks, alternatives and potential complications including but not limited to bleeding, infection, perforation, damage to internal organs and possible oversedation/side effects from anesthesia. The patient agrees and gives consent to proceed.  Please refer to procedure notes for findings, recommendations and patient disposition/instructions.     Ole ONEIDA Schick, MD [facility]

## 2023-07-29 NOTE — Anesthesia Preprocedure Evaluation (Addendum)
 Anesthesia Evaluation  Patient identified by MRN, date of birth, ID band Patient awake    Reviewed: Allergy & Precautions, NPO status , Patient's Chart, lab work & pertinent test results  History of Anesthesia Complications Negative for: history of anesthetic complications  Airway Mallampati: II   Neck ROM: Full    Dental no notable dental hx.    Pulmonary asthma    Pulmonary exam normal breath sounds clear to auscultation       Cardiovascular hypertension, + CAD  Normal cardiovascular exam Rhythm:Regular Rate:Normal     Neuro/Psych  PSYCHIATRIC DISORDERS Anxiety Depression    Chronic pain    GI/Hepatic PUD,GERD  ,,  Endo/Other  negative endocrine ROS    Renal/GU negative Renal ROS     Musculoskeletal   Abdominal   Peds  Hematology  (+) Blood dyscrasia, anemia   Anesthesia Other Findings   Reproductive/Obstetrics                             Anesthesia Physical Anesthesia Plan  ASA: 3  Anesthesia Plan: General   Post-op Pain Management:    Induction: Intravenous  PONV Risk Score and Plan: 3 and Propofol  infusion, TIVA and Treatment may vary due to age or medical condition  Airway Management Planned: Natural Airway  Additional Equipment:   Intra-op Plan:   Post-operative Plan:   Informed Consent: I have reviewed the patients History and Physical, chart, labs and discussed the procedure including the risks, benefits and alternatives for the proposed anesthesia with the patient or authorized representative who has indicated his/her understanding and acceptance.       Plan Discussed with: CRNA  Anesthesia Plan Comments: (LMA/GETA backup discussed.  Patient consented for risks of anesthesia including but not limited to:  - adverse reactions to medications - damage to eyes, teeth, lips or other oral mucosa - nerve damage due to positioning  - sore throat or hoarseness -  damage to heart, brain, nerves, lungs, other parts of body or loss of life  Informed patient about role of CRNA in peri- and intra-operative care.  Patient voiced understanding.)       Anesthesia Quick Evaluation

## 2023-07-30 DIAGNOSIS — M79669 Pain in unspecified lower leg: Secondary | ICD-10-CM | POA: Diagnosis not present

## 2023-07-30 DIAGNOSIS — M25579 Pain in unspecified ankle and joints of unspecified foot: Secondary | ICD-10-CM | POA: Diagnosis not present

## 2023-07-30 DIAGNOSIS — M545 Low back pain, unspecified: Secondary | ICD-10-CM | POA: Diagnosis not present

## 2023-07-30 DIAGNOSIS — R2681 Unsteadiness on feet: Secondary | ICD-10-CM | POA: Diagnosis not present

## 2023-07-30 NOTE — Anesthesia Postprocedure Evaluation (Signed)
 Anesthesia Post Note  Patient: Angela Espinoza  Procedure(s) Performed: COLONOSCOPY  Patient location during evaluation: PACU Anesthesia Type: General Level of consciousness: awake and alert, oriented and patient cooperative Pain management: pain level controlled Vital Signs Assessment: post-procedure vital signs reviewed and stable Respiratory status: spontaneous breathing, nonlabored ventilation and respiratory function stable Cardiovascular status: blood pressure returned to baseline and stable Postop Assessment: adequate PO intake Anesthetic complications: no   No notable events documented.   Last Vitals:  Vitals:   07/29/23 1140 07/29/23 1149  BP:  124/70  Pulse:    Resp: 16 16  Temp:    SpO2: 98% 98%    Last Pain:  Vitals:   07/29/23 1140  TempSrc:   PainSc: 0-No pain                 Alfonso Ruths

## 2023-07-31 ENCOUNTER — Encounter: Payer: Self-pay | Admitting: Nurse Practitioner

## 2023-08-01 DIAGNOSIS — R2681 Unsteadiness on feet: Secondary | ICD-10-CM | POA: Diagnosis not present

## 2023-08-01 DIAGNOSIS — M25579 Pain in unspecified ankle and joints of unspecified foot: Secondary | ICD-10-CM | POA: Diagnosis not present

## 2023-08-01 DIAGNOSIS — M79669 Pain in unspecified lower leg: Secondary | ICD-10-CM | POA: Diagnosis not present

## 2023-08-01 DIAGNOSIS — M545 Low back pain, unspecified: Secondary | ICD-10-CM | POA: Diagnosis not present

## 2023-08-06 ENCOUNTER — Other Ambulatory Visit (HOSPITAL_COMMUNITY): Payer: Self-pay

## 2023-08-06 ENCOUNTER — Telehealth: Payer: Self-pay | Admitting: Pharmacy Technician

## 2023-08-06 DIAGNOSIS — M79669 Pain in unspecified lower leg: Secondary | ICD-10-CM | POA: Diagnosis not present

## 2023-08-06 DIAGNOSIS — R2681 Unsteadiness on feet: Secondary | ICD-10-CM | POA: Diagnosis not present

## 2023-08-06 DIAGNOSIS — M545 Low back pain, unspecified: Secondary | ICD-10-CM | POA: Diagnosis not present

## 2023-08-06 DIAGNOSIS — M25579 Pain in unspecified ankle and joints of unspecified foot: Secondary | ICD-10-CM | POA: Diagnosis not present

## 2023-08-06 NOTE — Telephone Encounter (Signed)
 Pharmacy Patient Advocate Encounter   Received notification from CoverMyMeds that prior authorization for Nexlizet  180-10MG  tablets is due for renewal.   Insurance verification completed.   The patient is insured through [facility].  Action: PA required; PA submitted to above mentioned insurance via CoverMyMeds Key/confirmation #/EOC [id number] Status is pending

## 2023-08-07 ENCOUNTER — Other Ambulatory Visit (HOSPITAL_COMMUNITY): Payer: Self-pay

## 2023-08-07 ENCOUNTER — Telehealth: Payer: Self-pay | Admitting: Nurse Practitioner

## 2023-08-07 NOTE — Telephone Encounter (Signed)
 Pharmacy Patient Advocate Encounter  Received notification from [facility] that Prior Authorization for Nexlizet  180-10MG  tablets has been APPROVED from 08/06/23 to 01/29/24. Unable to obtain price due to refill too soon rejection, last fill date 07/16/23 next available fill dateon or after 08/08/23   PA #/Case ID/Reference #: [id number]

## 2023-08-07 NOTE — Telephone Encounter (Signed)
 No phone # was left to call, and rx was sent to local pharmacy not [facility]?

## 2023-08-07 NOTE — Telephone Encounter (Signed)
 Copied from CRM [id number]. Topic: General - Other >> Aug 07, 2023  9:22 AM Turkey B wrote: Reason for CRM: rose from [facility] says has clinical questions for pt taking med, NEXLIZET  180-10 MG TABS. She states they need this info before  July 11. She tried to request this before but had the wrong fx #. Ref # is ,   [id number]

## 2023-08-08 DIAGNOSIS — M25579 Pain in unspecified ankle and joints of unspecified foot: Secondary | ICD-10-CM | POA: Diagnosis not present

## 2023-08-08 DIAGNOSIS — M545 Low back pain, unspecified: Secondary | ICD-10-CM | POA: Diagnosis not present

## 2023-08-08 DIAGNOSIS — M79669 Pain in unspecified lower leg: Secondary | ICD-10-CM | POA: Diagnosis not present

## 2023-08-08 DIAGNOSIS — R2681 Unsteadiness on feet: Secondary | ICD-10-CM | POA: Diagnosis not present

## 2023-08-15 ENCOUNTER — Ambulatory Visit (INDEPENDENT_AMBULATORY_CARE_PROVIDER_SITE_OTHER): Admitting: Nurse Practitioner

## 2023-08-15 ENCOUNTER — Telehealth: Payer: Self-pay

## 2023-08-15 ENCOUNTER — Encounter: Payer: Self-pay | Admitting: Nurse Practitioner

## 2023-08-15 VITALS — BP 160/88 | HR 94 | Temp 98.0°F | Resp 18 | Ht 62.0 in | Wt 124.7 lb

## 2023-08-15 DIAGNOSIS — E611 Iron deficiency: Secondary | ICD-10-CM

## 2023-08-15 LAB — CBC WITH DIFFERENTIAL/PLATELET
Absolute Lymphocytes: 1339 {cells}/uL (ref 850–3900)
Absolute Monocytes: 439 {cells}/uL (ref 200–950)
Basophils Absolute: 72 {cells}/uL (ref 0–200)
Basophils Relative: 1 %
Eosinophils Absolute: 50 {cells}/uL (ref 15–500)
Eosinophils Relative: 0.7 %
HCT: 39.6 % (ref 35.0–45.0)
Hemoglobin: 12.9 g/dL (ref 11.7–15.5)
MCH: 31.2 pg (ref 27.0–33.0)
MCHC: 32.6 g/dL (ref 32.0–36.0)
MCV: 95.9 fL (ref 80.0–100.0)
MPV: 9.7 fL (ref 7.5–12.5)
Monocytes Relative: 6.1 %
Neutro Abs: 5299 {cells}/uL (ref 1500–7800)
Neutrophils Relative %: 73.6 %
Platelets: 366 Thousand/uL (ref 140–400)
RBC: 4.13 Million/uL (ref 3.80–5.10)
RDW: 11.9 % (ref 11.0–15.0)
Total Lymphocyte: 18.6 %
WBC: 7.2 Thousand/uL (ref 3.8–10.8)

## 2023-08-15 LAB — IRON,TIBC AND FERRITIN PANEL
%SAT: 31 % (ref 16–45)
Ferritin: 115 ng/mL (ref 16–288)
Iron: 102 ug/dL (ref 45–160)
TIBC: 330 ug/dL (ref 250–450)

## 2023-08-15 NOTE — Telephone Encounter (Signed)
 Copied from CRM [id number]. Topic: Clinical - Medical Advice >> Aug 15, 2023  9:18 AM Charlet HERO wrote: Reason for CRM: patient is calling about low iron and her level was10 and should be 12. Patient would like medical advice for her low iron.  She is fine to rcv message on my chart.

## 2023-08-15 NOTE — Telephone Encounter (Signed)
 Pt states went to give blood yesterday and her iron was a 10 and should of been a 12?  Please advise? Does she do nothing, schedule an appt? Or take OTC pills?

## 2023-08-15 NOTE — Progress Notes (Signed)
 BP (!) 162/86   Pulse 94   Temp 98 F (36.7 C)   Resp 18   Ht 5' 2 (1.575 m)   Wt 124 lb 11.2 oz (56.6 kg)   SpO2 97%   BMI 22.81 kg/m    Subjective:    Patient ID: Angela Espinoza, female    DOB: 10/18/1955, 68 y.o.   MRN: [id number]  HPI: Angela Espinoza is a 68 y.o. female  Chief Complaint  Patient presents with   Labs Only    Needs iron checked per blood donation it was to low to donate    Discussed the use of AI scribe software for clinical note transcription with the patient, who gave verbal consent to proceed.  History of Present Illness Angela Espinoza is a 68 year old female who presents with low iron levels after attempting to donate blood.  She attempted to donate blood the previous day but was informed that her iron level was 10.5, below the required level of 12 for donation. She has a history of a bleeding ulcer but reports no current abnormal bleeding.  Her blood pressure was elevated yesterday and today. She is not currently taking pregabalin, which she was previously prescribed, due to concerns about side effects.  She is actively going to the gym every other day, engaging in treadmill exercises and lower body workouts, which she feels have improved her well-being and contributed to weight loss. She describes her sleep pattern as going to bed around 10 PM and waking up around 8 AM. She uses a mouthguard at night to address teeth grinding. She mentions feeling cold at the gym despite the hot summer weather.         06/21/2023   10:35 AM 06/19/2023    9:57 AM 12/19/2022   10:57 AM  Depression screen PHQ 2/9  Decreased Interest 0 0 0  Down, Depressed, Hopeless 0 0 0  PHQ - 2 Score 0 0 0  Altered sleeping 0 0 0  Tired, decreased energy 0 0 0  Change in appetite 0 0 0  Feeling bad or failure about yourself  0 0 0  Trouble concentrating 0 0 0  Moving slowly or fidgety/restless 0 0 0  Suicidal thoughts 0 0 0  PHQ-9 Score 0 0 0   Difficult doing work/chores Not difficult at all Not difficult at all     Relevant past medical, surgical, family and social history reviewed and updated as indicated. Interim medical history since our last visit reviewed. Allergies and medications reviewed and updated.  Review of Systems  Constitutional: Negative for fever or weight change.  Respiratory: Negative for cough and shortness of breath.   Cardiovascular: Negative for chest pain or palpitations.  Gastrointestinal: Negative for abdominal pain, no bowel changes.  Musculoskeletal: Negative for gait problem or joint swelling.  Skin: Negative for rash.  Neurological: Negative for dizziness or headache.  No other specific complaints in a complete review of systems (except as listed in HPI above).      Objective:     BP (!) 162/86   Pulse 94   Temp 98 F (36.7 C)   Resp 18   Ht 5' 2 (1.575 m)   Wt 124 lb 11.2 oz (56.6 kg)   SpO2 97%   BMI 22.81 kg/m    Wt Readings from Last 3 Encounters:  08/15/23 124 lb 11.2 oz (56.6 kg)  07/29/23 120 lb 3.2 oz (54.5 kg)  06/21/23 125 lb (56.7  kg)    Physical Exam Physical Exam GENERAL: Alert, cooperative, well developed, no acute distress HEENT: Normocephalic, normal oropharynx, moist mucous membranes CHEST: Clear to auscultation bilaterally, No wheezes, rhonchi, or crackles CARDIOVASCULAR: Normal heart rate and rhythm, S1 and S2 normal without murmurs ABDOMEN: Soft, non-tender, non-distended, without organomegaly, Normal bowel sounds EXTREMITIES: No cyanosis or edema NEUROLOGICAL: Cranial nerves grossly intact, Moves all extremities without gross motor or sensory deficit   Results for orders placed or performed in visit on 06/19/23  CBC with Differential/Platelet   Collection Time: 06/19/23 10:37 AM  Result Value Ref Range   WBC 5.7 3.8 - 10.8 Thousand/uL   RBC 4.35 3.80 - 5.10 Million/uL   Hemoglobin 13.6 11.7 - 15.5 g/dL   HCT 58.4 64.9 - 54.9 %   MCV 95.4 80.0 -  100.0 fL   MCH 31.3 27.0 - 33.0 pg   MCHC 32.8 32.0 - 36.0 g/dL   RDW 88.2 88.9 - 84.9 %   Platelets 372 140 - 400 Thousand/uL   MPV 10.0 7.5 - 12.5 fL   Neutro Abs 3,488 1,500 - 7,800 cells/uL   Absolute Lymphocytes 1,596 850 - 3,900 cells/uL   Absolute Monocytes 456 200 - 950 cells/uL   Eosinophils Absolute 91 15 - 500 cells/uL   Basophils Absolute 68 0 - 200 cells/uL   Neutrophils Relative % 61.2 %   Total Lymphocyte 28.0 %   Monocytes Relative 8.0 %   Eosinophils Relative 1.6 %   Basophils Relative 1.2 %  Comprehensive metabolic panel with GFR   Collection Time: 06/19/23 10:37 AM  Result Value Ref Range   Glucose, Bld 90 65 - 99 mg/dL   BUN 11 7 - 25 mg/dL   Creat 9.36 9.49 - 8.94 mg/dL   eGFR 96 > OR = 60 fO/fpw/8.26f7   BUN/Creatinine Ratio SEE NOTE: 6 - 22 (calc)   Sodium 135 135 - 146 mmol/L   Potassium 4.1 3.5 - 5.3 mmol/L   Chloride 98 98 - 110 mmol/L   CO2 28 20 - 32 mmol/L   Calcium  10.1 8.6 - 10.4 mg/dL   Total Protein 7.2 6.1 - 8.1 g/dL   Albumin 4.7 3.6 - 5.1 g/dL   Globulin 2.5 1.9 - 3.7 g/dL (calc)   AG Ratio 1.9 1.0 - 2.5 (calc)   Total Bilirubin 0.7 0.2 - 1.2 mg/dL   Alkaline phosphatase (APISO) 47 37 - 153 U/L   AST 26 10 - 35 U/L   ALT 15 6 - 29 U/L  Lipid panel   Collection Time: 06/19/23 10:37 AM  Result Value Ref Range   Cholesterol 187 <200 mg/dL   HDL 84 > OR = 50 mg/dL   Triglycerides 64 <849 mg/dL   LDL Cholesterol (Calc) 88 mg/dL (calc)   Total CHOL/HDL Ratio 2.2 <5.0 (calc)   Non-HDL Cholesterol (Calc) 103 <130 mg/dL (calc)          Assessment & Plan:   Problem List Items Addressed This Visit   None Visit Diagnoses       Iron deficiency    -  Primary   Relevant Orders   CBC with Differential/Platelet   Iron, TIBC and Ferritin Panel        Assessment and Plan Assessment & Plan Iron deficiency anemia (suspected) Suspected iron deficiency anemia due to low iron levels (10.5) identified during a blood donation attempt,  below the required 12 for donation. No abnormal bleeding, bleeding ulcers, or medications contributing to anemia. Regular physical activity noted. -  Order iron panel and CBC to assess current iron levels and confirm anemia.  Elevated blood pressure reading in office today -she is not currently on blood pressure medication, will recheck.  -patient reports it was elevated yesterday, she thinks its due to her stress level.  -continue to monitor blood pressure reading.  If it continues to be elevated may need to start blood pressure medication.       Follow up plan: Return if symptoms worsen or fail to improve.

## 2023-08-16 ENCOUNTER — Ambulatory Visit: Payer: Self-pay | Admitting: Nurse Practitioner

## 2023-08-27 DIAGNOSIS — M79669 Pain in unspecified lower leg: Secondary | ICD-10-CM | POA: Diagnosis not present

## 2023-08-27 DIAGNOSIS — M25579 Pain in unspecified ankle and joints of unspecified foot: Secondary | ICD-10-CM | POA: Diagnosis not present

## 2023-08-27 DIAGNOSIS — R2681 Unsteadiness on feet: Secondary | ICD-10-CM | POA: Diagnosis not present

## 2023-08-27 DIAGNOSIS — M545 Low back pain, unspecified: Secondary | ICD-10-CM | POA: Diagnosis not present

## 2023-10-29 IMAGING — RF DG UGI W/ HIGH DENSITY W/O KUB
12 of 19 series · 14 of 24 positions shown · non-contrast
Comparison: None Available.

CLINICAL DATA: Pyloric stenosis

EXAM:
UPPER GI SERIES WITH HIGH DENSITY WITHOUT KUB
TECHNIQUE: Combined double and single contrast examination was performed using
effervescent crystals, high-density barium, and thin liquid barium.
This exam was performed by APP name, and was supervised and
interpreted by Rad name.
FLUOROSCOPY:
Radiation Exposure Index (as provided by the fluoroscopic device):
44.5 mGy Kerma

[Series 10: cp_standard · 0.17mm/px · 1 of 51 frames shown (1 of 8)]
[frame 8/51]
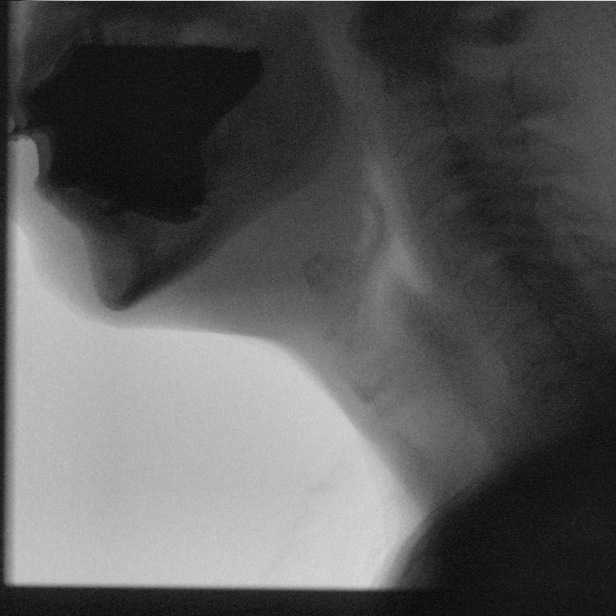

[Series 11: cp_standard · 0.17mm/px · 1 of 95 frames shown (2 of 8)]
[frame 48/95]
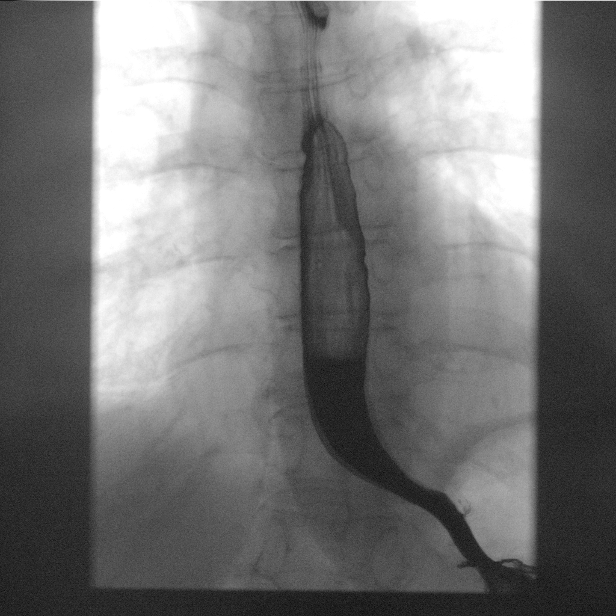

[Series 13: cp_standard · 0.17mm/px · 1 of 80 frames shown (3 of 8)]
[frame 1/80]
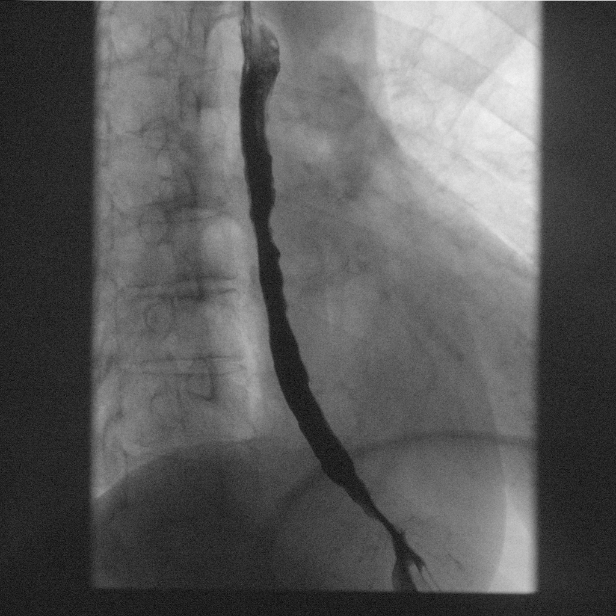

[Series 15: fluoro_barium 2fps_bw · 0.17mm/px · 1 of 4 frames shown (1 of 4)]
[frame 1/4]
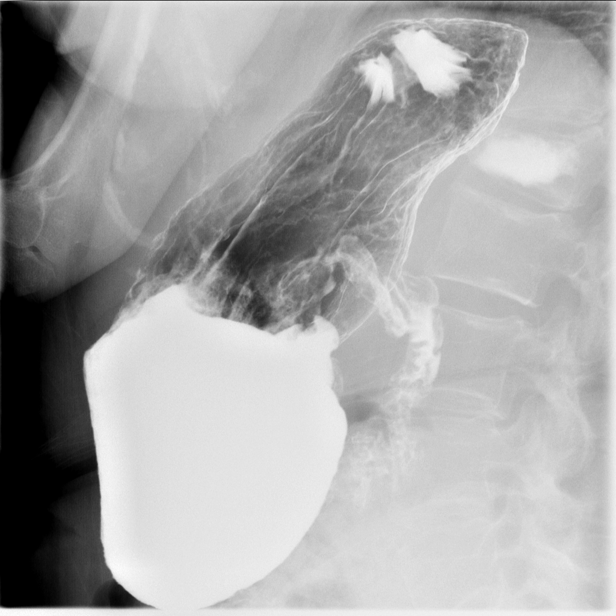

[Series 16: fluoro_barium 2fps_bw · 0.17mm/px · 1 of 2 frames shown (2 of 4)]
[frame 1/2]
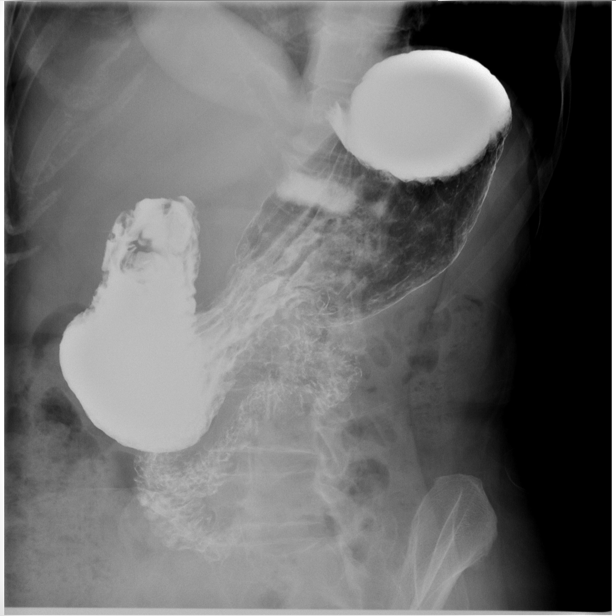

[Series 18: fluoro_barium 2fps_bw · 0.17mm/px · 1 of 4 frames shown (3 of 4)]
[frame 3/4]
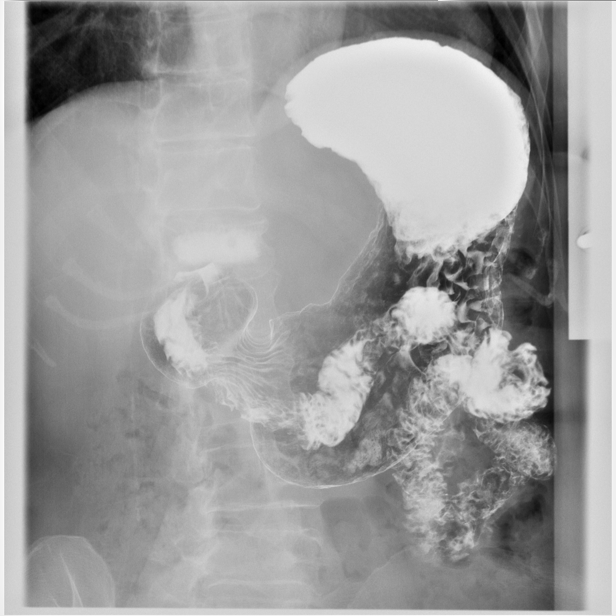

[Series 20: cp_standard · 0.17mm/px · 2 of 110 frames shown (4 of 8)]
[frame 17/110]
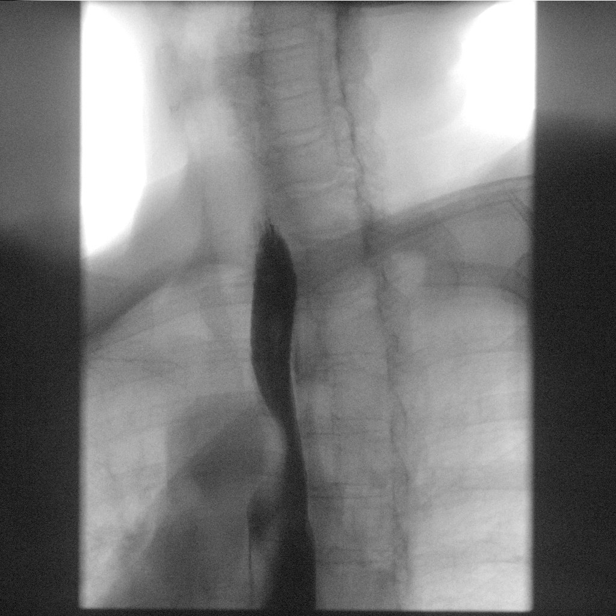
[frame 94/110]
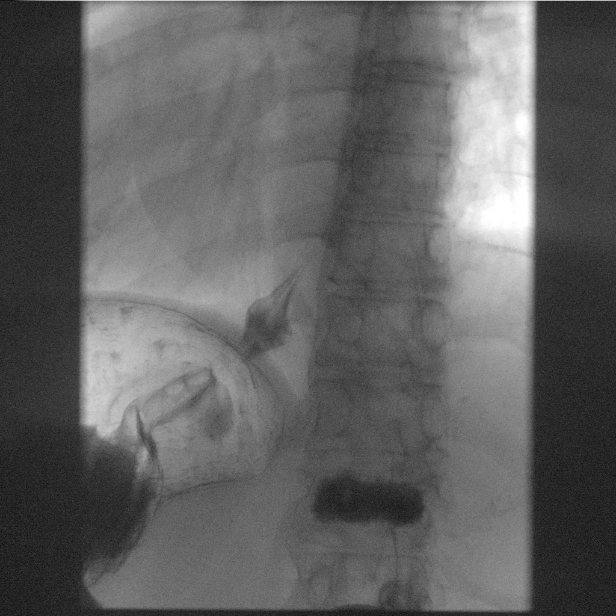

[Series 22: cp_standard · 0.26mm/px · 1 of 33 frames shown (5 of 8)]
[frame 29/33]
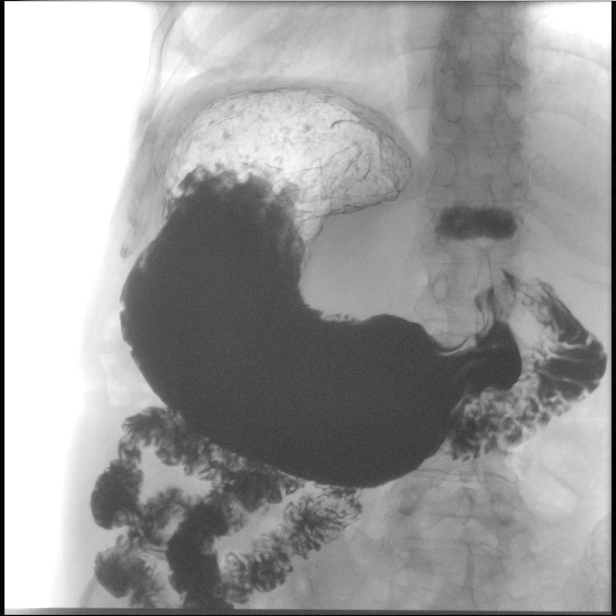

[Series 24: fluoro_barium 2fps_bw · 0.17mm/px · 1 of 5 frames shown (4 of 4)]
[frame 3/5]
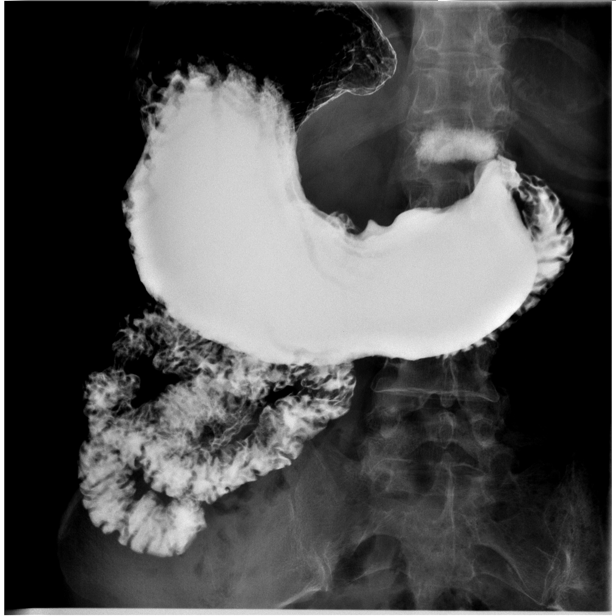

[Series 26: cp_standard · 0.17mm/px · 2 of 41 frames shown (6 of 8)]
[frame 7/41]
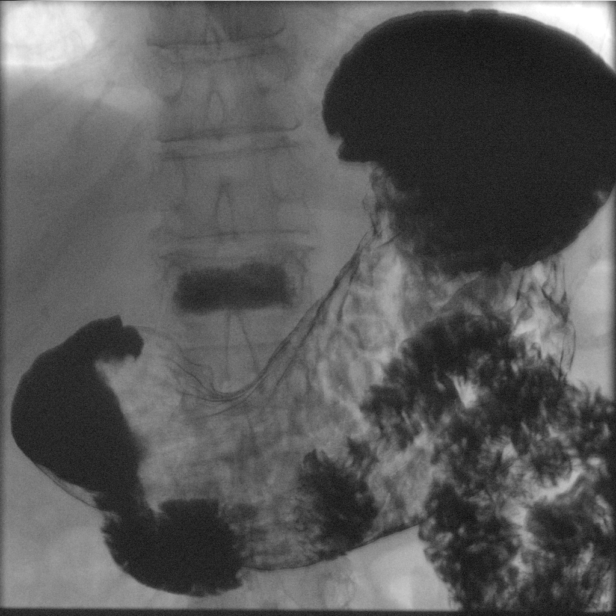
[frame 38/41]
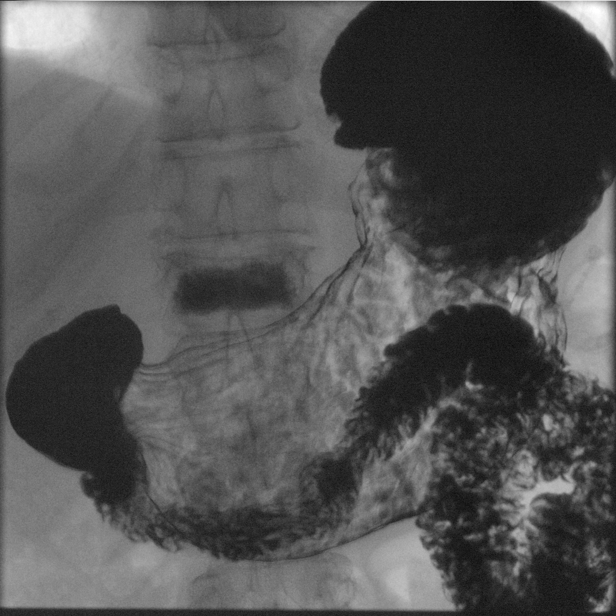

[Series 28: cp_standard · 0.17mm/px · 1 of 104 frames shown (7 of 8)]
[frame 39/104]
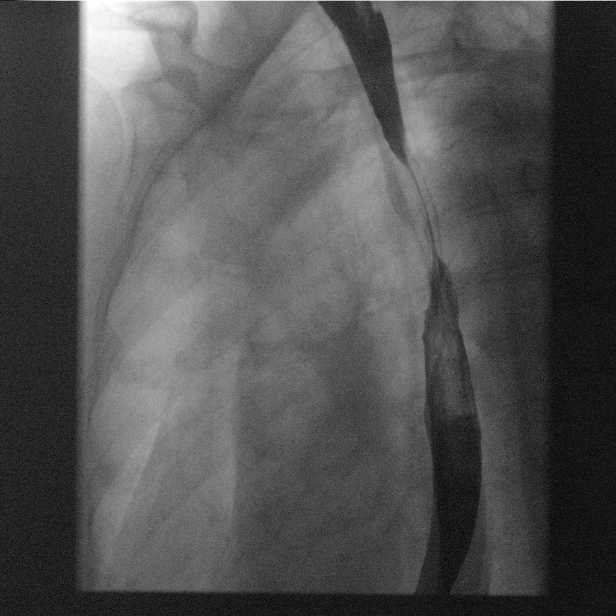

[Series 29: cp_standard · 0.17mm/px · 1 of 62 frames shown (8 of 8)]
[frame 53/62]
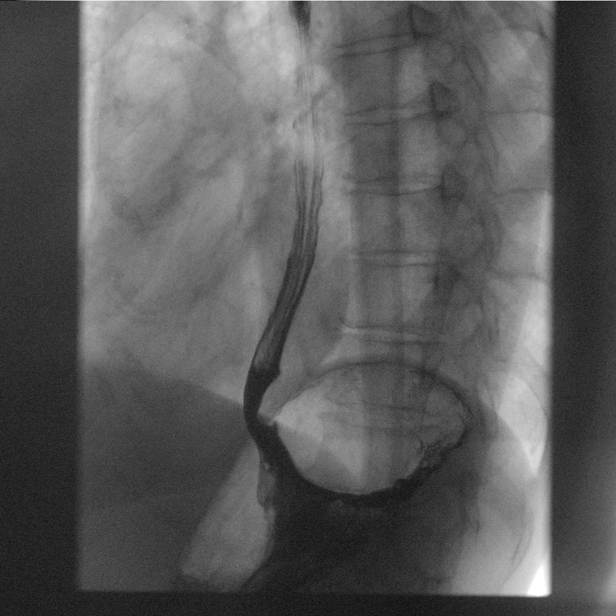

[14 of 24 positions shown; findings below may reference images not displayed]

FINDINGS: Swallowing: Appears normal. No vestibular penetration or aspiration
seen.

Pharynx: Unremarkable.

Esophagus: Normal contour and caliber.

Esophageal motility: Tertiary contractions seen throughout.

Hiatal Hernia: None.

Gastroesophageal reflux: None visualized.

Ingested 13mm barium tablet: Passed normally.

Stomach and proximal small bowel: Normal partial double contrast
appearance of the stomach. The pylorus appears narrowed, however
there is adequate opacification of the duodenum, which is
unremarkable in appearance.

Other: None.
IMPRESSION: 1. Moderate esophageal dysmotility.
2. The pylorus appears narrowed, generally in keeping with reported
diagnosis of pyloric stenosis.

## 2023-10-30 ENCOUNTER — Other Ambulatory Visit: Payer: Self-pay | Admitting: Nurse Practitioner

## 2023-10-30 DIAGNOSIS — Z1231 Encounter for screening mammogram for malignant neoplasm of breast: Secondary | ICD-10-CM

## 2023-11-08 ENCOUNTER — Other Ambulatory Visit: Payer: Self-pay | Admitting: Nurse Practitioner

## 2023-11-08 DIAGNOSIS — J301 Allergic rhinitis due to pollen: Secondary | ICD-10-CM

## 2023-11-11 NOTE — Telephone Encounter (Signed)
 Requested Prescriptions  Pending Prescriptions Disp Refills   levocetirizine (XYZAL ) 5 MG tablet [Pharmacy Med Name: LEVOCETIRIZINE 5 MG TABLET] 90 tablet 0    Sig: TAKE 1 TABLET BY MOUTH EVERY DAY IN THE EVENING     Ear, Nose, and Throat:  Antihistamines - levocetirizine dihydrochloride  Passed - 11/11/2023  2:36 PM      Passed - Cr in normal range and within 360 days    Creat  Date Value Ref Range Status  06/19/2023 0.63 0.50 - 1.05 mg/dL Final         Passed - eGFR is 10 or above and within 360 days    EGFR (African American)  Date Value Ref Range Status  08/12/2011 >60  Final   GFR calc Af Amer  Date Value Ref Range Status  01/01/2018 81 >59 mL/min/1.73 Final   EGFR (Non-African Amer.)  Date Value Ref Range Status  08/12/2011 >60  Final    Comment:    eGFR values <21mL/min/1.73 m2 may be an indication of chronic kidney disease (CKD). Calculated eGFR is useful in patients with stable renal function. The eGFR calculation will not be reliable in acutely ill patients when serum creatinine is changing rapidly. It is not useful in  patients on dialysis. The eGFR calculation may not be applicable to patients at the low and high extremes of body sizes, pregnant women, and vegetarians.    GFR, Estimated  Date Value Ref Range Status  01/31/2021 >60 >60 mL/min Final    Comment:    (NOTE) Calculated using the CKD-EPI Creatinine Equation (2021)    eGFR  Date Value Ref Range Status  06/19/2023 96 > OR = 60 mL/min/1.5m2 Final         Passed - Valid encounter within last 12 months    Recent Outpatient Visits           2 months ago Iron deficiency   [facility] Gareth Clarity F, FNP   4 months ago Coronary artery disease due to lipid rich plaque   [facility] Gareth Clarity FALCON, [place]

## 2023-12-16 ENCOUNTER — Ambulatory Visit
Admission: RE | Admit: 2023-12-16 | Discharge: 2023-12-16 | Disposition: A | Discharge status: Home / Self Care | Source: Ambulatory Visit | Attending: Nurse Practitioner | Admitting: Nurse Practitioner

## 2023-12-16 DIAGNOSIS — Z1231 Encounter for screening mammogram for malignant neoplasm of breast: Secondary | ICD-10-CM | POA: Diagnosis present

## 2023-12-23 ENCOUNTER — Encounter: Payer: Self-pay | Admitting: Nurse Practitioner

## 2023-12-23 ENCOUNTER — Ambulatory Visit: Admitting: Nurse Practitioner

## 2023-12-23 VITALS — BP 136/106 | HR 86 | Temp 98.4°F | Ht 62.0 in | Wt 119.0 lb

## 2023-12-23 DIAGNOSIS — I2510 Atherosclerotic heart disease of native coronary artery without angina pectoris: Secondary | ICD-10-CM | POA: Diagnosis not present

## 2023-12-23 DIAGNOSIS — G8929 Other chronic pain: Secondary | ICD-10-CM

## 2023-12-23 DIAGNOSIS — I2583 Coronary atherosclerosis due to lipid rich plaque: Secondary | ICD-10-CM

## 2023-12-23 DIAGNOSIS — M545 Low back pain, unspecified: Secondary | ICD-10-CM

## 2023-12-23 DIAGNOSIS — I10 Essential (primary) hypertension: Secondary | ICD-10-CM | POA: Diagnosis not present

## 2023-12-23 DIAGNOSIS — K311 Adult hypertrophic pyloric stenosis: Secondary | ICD-10-CM | POA: Diagnosis not present

## 2023-12-23 DIAGNOSIS — K219 Gastro-esophageal reflux disease without esophagitis: Secondary | ICD-10-CM

## 2023-12-23 DIAGNOSIS — M5137 Other intervertebral disc degeneration, lumbosacral region with discogenic back pain only: Secondary | ICD-10-CM

## 2023-12-23 DIAGNOSIS — M5135 Other intervertebral disc degeneration, thoracolumbar region: Secondary | ICD-10-CM

## 2023-12-23 DIAGNOSIS — J4520 Mild intermittent asthma, uncomplicated: Secondary | ICD-10-CM

## 2023-12-23 DIAGNOSIS — E782 Mixed hyperlipidemia: Secondary | ICD-10-CM

## 2023-12-23 DIAGNOSIS — M25552 Pain in left hip: Secondary | ICD-10-CM

## 2023-12-23 DIAGNOSIS — N951 Menopausal and female climacteric states: Secondary | ICD-10-CM

## 2023-12-23 DIAGNOSIS — J301 Allergic rhinitis due to pollen: Secondary | ICD-10-CM

## 2023-12-23 MED ORDER — METOPROLOL TARTRATE 25 MG PO TABS
12.5000 mg | ORAL_TABLET | Freq: Every day | ORAL | 1 refills | Status: AC
Start: 2023-12-23 — End: ?

## 2023-12-23 MED ORDER — PREMARIN 0.625 MG/GM VA CREA
TOPICAL_CREAM | VAGINAL | 12 refills | Status: AC
Start: 2023-12-23 — End: ?

## 2023-12-23 MED ORDER — DULOXETINE HCL 30 MG PO CPEP: 30.0000 mg | ORAL_CAPSULE | Freq: Every day | ORAL | 1 refills | Status: AC

## 2023-12-23 MED ORDER — FEXOFENADINE HCL 180 MG PO TABS: 180.0000 mg | ORAL_TABLET | Freq: Every day | ORAL | 3 refills | Status: AC

## 2023-12-23 NOTE — Progress Notes (Signed)
 BP (!) 136/106   Pulse 86   Temp 98.4 F (36.9 C)   Ht 5' 2 (1.575 m)   Wt 119 lb (54 kg)   SpO2 98%   BMI 21.77 kg/m    Subjective:    Patient ID: Angela Espinoza, female    DOB: 12/17/1955, 68 y.o.   MRN: [id number]  HPI: Angela Espinoza is a 68 y.o. female  Chief Complaint  Patient presents with   Medical Management of Chronic Issues   Discussed the use of AI scribe software for clinical note transcription with the patient, who gave verbal consent to proceed.  History of Present Illness Angela Espinoza is a 68 year old female with hypertension and coronary artery disease who presents for a six month follow-up.  Hypertension - Blood pressure at 144/106 mmHg at today's visit - Not currently taking losartan or metoprolol , which were previously prescribed - Does not regularly monitor blood pressure at home despite owning a blood pressure monitor BP Readings from Last 3 Encounters:  12/23/23 (!) 136/106  08/15/23 (!) 160/88  07/29/23 124/70    Coronary artery disease and hyperlipidemia - History of coronary artery disease - Hyperlipidemia managed with Nexlizet  180/10 mg daily - Intolerant to statins  Asthma - Asthma is well controlled  Gastrointestinal symptoms - History of pyloric stenosis and acid reflux - Under the care of gastroenterology (Dr. Maryruth) - Up to date on colonoscopy  Chronic back and hip pain - Chronic back pain due to degenerative disc disease in lumbar and thoracolumbar regions and spondylosis - Chronic hip pain - Cymbalta  30 mg daily provides relief for back pain  Vaginal dryness - Experiences dryness - Uses over-the-counter gels for symptomatic relief  Physical activity and weight loss - Active with regular gym attendance - Weight loss attributed to increased physical activity     Flowsheet Row Office Visit from 12/23/2023 in [facility]  1 32 inches       12/23/2023   10:19 AM  12/23/2023   10:04 AM 06/21/2023   10:35 AM  Depression screen PHQ 2/9  Decreased Interest 0 0 0  Down, Depressed, Hopeless 0 0 0  PHQ - 2 Score 0 0 0  Altered sleeping 0 0 0  Tired, decreased energy 0 0 0  Change in appetite 0 0 0  Feeling bad or failure about yourself  0 0 0  Trouble concentrating 0 0 0  Moving slowly or fidgety/restless 0 0 0  Suicidal thoughts 0 0 0  PHQ-9 Score 0 0 0   Difficult doing work/chores  Not difficult at all Not difficult at all     Data saved with a previous flowsheet row definition    Relevant past medical, surgical, family and social history reviewed and updated as indicated. Interim medical history since our last visit reviewed. Allergies and medications reviewed and updated.  Review of Systems  Constitutional: Negative for fever or weight change.  Respiratory: Negative for cough and shortness of breath.   Cardiovascular: Negative for chest pain or palpitations.  Gastrointestinal: Negative for abdominal pain, no bowel changes.  Musculoskeletal: Negative for gait problem or joint swelling.  Skin: Negative for rash.  Neurological: Negative for dizziness or headache.  No other specific complaints in a complete review of systems (except as listed in HPI above).      Objective:      BP (!) 136/106   Pulse 86   Temp 98.4 F (36.9 C)  Ht 5' 2 (1.575 m)   Wt 119 lb (54 kg)   SpO2 98%   BMI 21.77 kg/m    Wt Readings from Last 3 Encounters:  12/23/23 119 lb (54 kg)  08/15/23 124 lb 11.2 oz (56.6 kg)  07/29/23 120 lb 3.2 oz (54.5 kg)    Physical Exam VITALS: BP- 136/106 GENERAL: Alert, cooperative, well developed, no acute distress HEENT: Normocephalic, normal oropharynx, moist mucous membranes CHEST: Clear to auscultation bilaterally, No wheezes, rhonchi, or crackles CARDIOVASCULAR: Normal heart rate and rhythm, S1 and S2 normal without murmurs ABDOMEN: Soft, non-tender, non-distended, without organomegaly, Normal bowel  sounds EXTREMITIES: No cyanosis or edema NEUROLOGICAL: Cranial nerves grossly intact, Moves all extremities without gross motor or sensory deficit  Results for orders placed or performed in visit on 08/15/23  CBC with Differential/Platelet   Collection Time: 08/15/23 11:28 AM  Result Value Ref Range   WBC 7.2 3.8 - 10.8 Thousand/uL   RBC 4.13 3.80 - 5.10 Million/uL   Hemoglobin 12.9 11.7 - 15.5 g/dL   HCT 60.3 64.9 - 54.9 %   MCV 95.9 80.0 - 100.0 fL   MCH 31.2 27.0 - 33.0 pg   MCHC 32.6 32.0 - 36.0 g/dL   RDW 88.0 88.9 - 84.9 %   Platelets 366 140 - 400 Thousand/uL   MPV 9.7 7.5 - 12.5 fL   Neutro Abs 5,299 1,500 - 7,800 cells/uL   Absolute Lymphocytes 1,339 850 - 3,900 cells/uL   Absolute Monocytes 439 200 - 950 cells/uL   Eosinophils Absolute 50 15 - 500 cells/uL   Basophils Absolute 72 0 - 200 cells/uL   Neutrophils Relative % 73.6 %   Total Lymphocyte 18.6 %   Monocytes Relative 6.1 %   Eosinophils Relative 0.7 %   Basophils Relative 1.0 %  Iron, TIBC and Ferritin Panel   Collection Time: 08/15/23 11:28 AM  Result Value Ref Range   Iron 102 45 - 160 mcg/dL   TIBC 669 749 - 549 mcg/dL (calc)   %SAT 31 16 - 45 % (calc)   Ferritin 115 16 - 288 ng/mL          Assessment & Plan:   Problem List Items Addressed This Visit       Cardiovascular and Mediastinum   Hypertension - Primary   Relevant Medications   metoprolol  tartrate (LOPRESSOR ) 25 MG tablet   Other Relevant Orders   CBC with Differential/Platelet   Comprehensive metabolic panel with GFR   Coronary artery disease due to lipid rich plaque   Relevant Medications   metoprolol  tartrate (LOPRESSOR ) 25 MG tablet     Respiratory   Asthma without status asthmaticus   Seasonal allergic rhinitis due to pollen   Relevant Medications   fexofenadine  (ALLEGRA ) 180 MG tablet     Digestive   Acid reflux (Chronic)   Pyloric stenosis     Musculoskeletal and Integument   DDD (degenerative disc disease),  lumbosacral (Chronic)   DDD (degenerative disc disease), thoracolumbar (Chronic)     Other   Chronic hip pain (Left) (Chronic)   Relevant Medications   DULoxetine  (CYMBALTA ) 30 MG capsule   Chronic midline low back pain without sciatica   Relevant Medications   DULoxetine  (CYMBALTA ) 30 MG capsule   Hyperlipidemia   Relevant Medications   metoprolol  tartrate (LOPRESSOR ) 25 MG tablet   Other Relevant Orders   Comprehensive metabolic panel with GFR   Lipid panel   Other Visit Diagnoses       Vaginal  dryness, menopausal       Relevant Medications   conjugated estrogens  (PREMARIN ) vaginal cream        Assessment and Plan Assessment & Plan Essential hypertension Blood pressure elevated at 144/106 mmHg. Previously on losartan and metoprolol . Current reading suggests need for medication adjustment. - Rechecked blood pressure before leaving - Restarted metoprolol  12.5 mg once daily if blood pressure remains high - Encouraged home blood pressure monitoring  Atherosclerotic heart disease of native coronary artery Coronary artery disease. Lipid Panel     Component Value Date/Time   CHOL 187 06/19/2023 1037   TRIG 64 06/19/2023 1037   HDL 84 06/19/2023 1037   CHOLHDL 2.2 06/19/2023 1037   LDLCALC 88 06/19/2023 1037   Continue nexlizet    Asthma, well controlled Asthma is well controlled.  Degenerative disc disease of lumbar and thoracolumbar spine with chronic low back pain Chronic low back pain due to degenerative disc disease. Reports improvement in symptoms, possibly due to Cymbalta  and gym activities. - Continue Cymbalta  30 mg daily  Chronic left hip pain Continue cymbalta , doing well with the gym  Gastroesophageal reflux disease and adult hypertrophic pyloric stenosis Under care of gastroenterologist.  Mixed hyperlipidemia, statin intolerant Statin intolerant. Currently on Nexlizet  180 mg daily. -getting labs  Allergic rhinitis due to pollen Currently taking LEV  for allergies. Requests switch to Allegra . - Prescribed Allegra  for allergic rhinitis  Menopausal symptoms (vaginal dryness) Reports vaginal dryness. Discussed use of estrogen cream for symptom relief. - Prescribed estrogen cream to be applied three times a week          Follow up plan: Return in about 6 months (around 06/21/2024) for follow up.

## 2023-12-24 ENCOUNTER — Ambulatory Visit: Payer: Self-pay | Admitting: Nurse Practitioner

## 2023-12-24 LAB — CBC WITH DIFFERENTIAL/PLATELET
Absolute Lymphocytes: 1119 {cells}/uL (ref 850–3900)
Absolute Monocytes: 470 {cells}/uL (ref 200–950)
Basophils Absolute: 81 {cells}/uL (ref 0–200)
Basophils Relative: 1.4 %
Eosinophils Absolute: 209 {cells}/uL (ref 15–500)
Eosinophils Relative: 3.6 %
HCT: 40.6 % (ref 35.9–46.0)
Hemoglobin: 13.4 g/dL (ref 11.7–15.5)
MCH: 31.5 pg (ref 27.0–33.0)
MCHC: 33 g/dL (ref 31.6–35.4)
MCV: 95.5 fL (ref 81.4–101.7)
MPV: 9.4 fL (ref 7.5–12.5)
Monocytes Relative: 8.1 %
Neutro Abs: 3921 {cells}/uL (ref 1500–7800)
Neutrophils Relative %: 67.6 %
Platelets: 424 Thousand/uL — ABNORMAL HIGH (ref 140–400)
RBC: 4.25 Million/uL (ref 3.80–5.10)
RDW: 11.6 % (ref 11.0–15.0)
Total Lymphocyte: 19.3 %
WBC: 5.8 Thousand/uL (ref 3.8–10.8)

## 2023-12-24 LAB — LIPID PANEL
Cholesterol: 202 mg/dL — ABNORMAL HIGH (ref <200)
HDL: 74 mg/dL (ref >=50)
LDL Cholesterol (Calc): 112 mg/dL — ABNORMAL HIGH
Non-HDL Cholesterol (Calc): 128 mg/dL (ref <130)
Total CHOL/HDL Ratio: 2.7 (calc) (ref <5.0)
Triglycerides: 72 mg/dL (ref <150)

## 2023-12-24 LAB — COMPREHENSIVE METABOLIC PANEL WITH GFR
AG Ratio: 2.3 (calc) (ref 1.0–2.5)
ALT: 13 U/L (ref 6–29)
AST: 26 U/L (ref 10–35)
Albumin: 4.8 g/dL (ref 3.6–5.1)
Alkaline phosphatase (APISO): 49 U/L (ref 37–153)
BUN: 10 mg/dL (ref 7–25)
CO2: 28 mmol/L (ref 20–32)
Calcium: 10 mg/dL (ref 8.6–10.4)
Chloride: 100 mmol/L (ref 98–110)
Creat: 0.7 mg/dL (ref 0.50–1.05)
Globulin: 2.1 g/dL (ref 1.9–3.7)
Glucose, Bld: 99 mg/dL (ref 65–99)
Potassium: 4.2 mmol/L (ref 3.5–5.3)
Sodium: 137 mmol/L (ref 135–146)
Total Bilirubin: 0.6 mg/dL (ref 0.2–1.2)
Total Protein: 6.9 g/dL (ref 6.1–8.1)
eGFR: 94 mL/min/1.73m2 (ref >=60)

## 2024-01-10 ENCOUNTER — Telehealth: Payer: Self-pay | Admitting: Pharmacy Technician

## 2024-01-10 ENCOUNTER — Other Ambulatory Visit (HOSPITAL_COMMUNITY): Payer: Self-pay

## 2024-01-10 ENCOUNTER — Other Ambulatory Visit: Payer: Self-pay | Admitting: Nurse Practitioner

## 2024-01-10 DIAGNOSIS — I2510 Atherosclerotic heart disease of native coronary artery without angina pectoris: Secondary | ICD-10-CM

## 2024-01-10 DIAGNOSIS — E785 Hyperlipidemia, unspecified: Secondary | ICD-10-CM

## 2024-01-10 NOTE — Telephone Encounter (Signed)
 Pharmacy Patient Advocate Encounter   Received notification from CoverMyMeds that prior authorization for Nexlizet  180-10MG  tablets is due for renewal.   Insurance verification completed.   The patient is insured through [facility].  Action: PA required; PA submitted to above mentioned insurance via CoverMyMeds Key/confirmation #/[facility] Status is pending

## 2024-01-10 NOTE — Telephone Encounter (Signed)
 Pharmacy Patient Advocate Encounter  Received notification from [employer] medic[employer] that Prior Authorization for Nexlizet  180-10MG  tablets has been APPROVED from 01/10/24 to 01/28/25. Ran test claim, Copay is $47.00. This test claim was processed through [facility]- copay amounts may vary at other pharmacies due to pharmacy/plan contracts, or as the patient moves through the different stages of their insurance plan.   PA #/Case ID/Reference #: [id number]

## 2024-01-13 NOTE — Telephone Encounter (Signed)
 Requested medication (s) are due for refill today: yes  Requested medication (s) are on the active medication list: yes  Last refill:  07/16/23  Future visit scheduled: yes  Notes to clinic:   Medication not assigned to a protocol, review manually.      Requested Prescriptions  Pending Prescriptions Disp Refills   NEXLIZET  180-10 MG TABS [Pharmacy Med Name: NEXLIZET  180-10 MG TABLET] 30 tablet 5    Sig: TAKE 1 TABLET BY MOUTH EVERY DAY     Off-Protocol Failed - 01/13/2024  4:17 PM      Failed - Medication not assigned to a protocol, review manually.      Passed - Valid encounter within last 12 months    Recent Outpatient Visits           3 weeks ago Hypertension, unspecified type   [facility] Gareth Mliss FAL[place], FNP   5 months ago Iron deficiency   [facility] Gareth Mliss F, FNP   6 months ago Coronary artery disease due to lipid rich plaque   [facility] Gareth Mliss [place], [place]

## 2024-06-23 ENCOUNTER — Ambulatory Visit: Admitting: Nurse Practitioner
